# Patient Record
Sex: Female | Born: 1955
Health system: Southern US, Community
[De-identification: ages and names within clinical notes are randomized; demographics above are authoritative.]

## PROBLEM LIST (undated history)

## (undated) DIAGNOSIS — J189 Pneumonia, unspecified organism: Secondary | ICD-10-CM

## (undated) DIAGNOSIS — M949 Disorder of cartilage, unspecified: Secondary | ICD-10-CM

## (undated) DIAGNOSIS — M549 Dorsalgia, unspecified: Secondary | ICD-10-CM

## (undated) DIAGNOSIS — N390 Urinary tract infection, site not specified: Secondary | ICD-10-CM

## (undated) DIAGNOSIS — K30 Functional dyspepsia: Secondary | ICD-10-CM

## (undated) DIAGNOSIS — R112 Nausea with vomiting, unspecified: Secondary | ICD-10-CM

## (undated) DIAGNOSIS — E785 Hyperlipidemia, unspecified: Secondary | ICD-10-CM

## (undated) DIAGNOSIS — Z87442 Personal history of urinary calculi: Secondary | ICD-10-CM

## (undated) DIAGNOSIS — R300 Dysuria: Secondary | ICD-10-CM

## (undated) DIAGNOSIS — Z9889 Other specified postprocedural states: Secondary | ICD-10-CM

## (undated) DIAGNOSIS — K219 Gastro-esophageal reflux disease without esophagitis: Secondary | ICD-10-CM

## (undated) DIAGNOSIS — R109 Unspecified abdominal pain: Secondary | ICD-10-CM

## (undated) DIAGNOSIS — I639 Cerebral infarction, unspecified: Secondary | ICD-10-CM

## (undated) DIAGNOSIS — N952 Postmenopausal atrophic vaginitis: Secondary | ICD-10-CM

## (undated) DIAGNOSIS — Z9079 Acquired absence of other genital organ(s): Secondary | ICD-10-CM

## (undated) DIAGNOSIS — Z8719 Personal history of other diseases of the digestive system: Secondary | ICD-10-CM

## (undated) DIAGNOSIS — K589 Irritable bowel syndrome without diarrhea: Secondary | ICD-10-CM

## (undated) DIAGNOSIS — M797 Fibromyalgia: Secondary | ICD-10-CM

## (undated) DIAGNOSIS — M51379 Other intervertebral disc degeneration, lumbosacral region without mention of lumbar back pain or lower extremity pain: Secondary | ICD-10-CM

## (undated) DIAGNOSIS — G8929 Other chronic pain: Secondary | ICD-10-CM

## (undated) DIAGNOSIS — T4145XA Adverse effect of unspecified anesthetic, initial encounter: Secondary | ICD-10-CM

## (undated) DIAGNOSIS — M5137 Other intervertebral disc degeneration, lumbosacral region: Secondary | ICD-10-CM

## (undated) DIAGNOSIS — R198 Other specified symptoms and signs involving the digestive system and abdomen: Secondary | ICD-10-CM

## (undated) DIAGNOSIS — F419 Anxiety disorder, unspecified: Secondary | ICD-10-CM

## (undated) DIAGNOSIS — F988 Other specified behavioral and emotional disorders with onset usually occurring in childhood and adolescence: Secondary | ICD-10-CM

## (undated) DIAGNOSIS — M1990 Unspecified osteoarthritis, unspecified site: Secondary | ICD-10-CM

## (undated) DIAGNOSIS — R1011 Right upper quadrant pain: Secondary | ICD-10-CM

## (undated) DIAGNOSIS — I350 Nonrheumatic aortic (valve) stenosis: Secondary | ICD-10-CM

## (undated) DIAGNOSIS — S02640A Fracture of ramus of mandible, unspecified side, initial encounter for closed fracture: Secondary | ICD-10-CM

## (undated) DIAGNOSIS — F341 Dysthymic disorder: Secondary | ICD-10-CM

## (undated) DIAGNOSIS — Z9283 Personal history of failed moderate sedation: Secondary | ICD-10-CM

## (undated) DIAGNOSIS — M899 Disorder of bone, unspecified: Secondary | ICD-10-CM

## (undated) DIAGNOSIS — R079 Chest pain, unspecified: Secondary | ICD-10-CM

## (undated) DIAGNOSIS — T8859XA Other complications of anesthesia, initial encounter: Secondary | ICD-10-CM

## (undated) DIAGNOSIS — Z8739 Personal history of other diseases of the musculoskeletal system and connective tissue: Principal | ICD-10-CM

## (undated) DIAGNOSIS — R748 Abnormal levels of other serum enzymes: Secondary | ICD-10-CM

## (undated) DIAGNOSIS — R413 Other amnesia: Secondary | ICD-10-CM

## (undated) DIAGNOSIS — K649 Unspecified hemorrhoids: Secondary | ICD-10-CM

## (undated) DIAGNOSIS — Z9089 Acquired absence of other organs: Secondary | ICD-10-CM

## (undated) DIAGNOSIS — Z8489 Family history of other specified conditions: Secondary | ICD-10-CM

## (undated) DIAGNOSIS — D485 Neoplasm of uncertain behavior of skin: Secondary | ICD-10-CM

## (undated) DIAGNOSIS — G894 Chronic pain syndrome: Secondary | ICD-10-CM

## (undated) DIAGNOSIS — R45851 Suicidal ideations: Secondary | ICD-10-CM

## (undated) DIAGNOSIS — M5412 Radiculopathy, cervical region: Secondary | ICD-10-CM

## (undated) HISTORY — DX: Irritable bowel syndrome without diarrhea: K58.9

## (undated) HISTORY — PX: COLONOSCOPY: SHX174

## (undated) HISTORY — DX: Urinary tract infection, site not specified: N39.0

## (undated) HISTORY — DX: Dorsalgia, unspecified: M54.9

## (undated) HISTORY — DX: Disorder of bone, unspecified: M89.9

## (undated) HISTORY — PX: TUBAL LIGATION: SHX77

## (undated) HISTORY — DX: Suicidal ideations: R45.851

## (undated) HISTORY — DX: Chronic pain syndrome: G89.4

## (undated) HISTORY — DX: Neoplasm of uncertain behavior of skin: D48.5

## (undated) HISTORY — PX: DILATION AND CURETTAGE OF UTERUS: SHX78

## (undated) HISTORY — DX: Other specified postprocedural states: Z98.89

## (undated) HISTORY — DX: Disorder of cartilage, unspecified: M94.9

## (undated) HISTORY — DX: Dysuria: R30.0

## (undated) HISTORY — DX: Unspecified hemorrhoids: K64.9

## (undated) HISTORY — DX: Postmenopausal atrophic vaginitis: N95.2

## (undated) HISTORY — DX: Unspecified osteoarthritis, unspecified site: M19.90

## (undated) HISTORY — PX: CARDIAC CATHETERIZATION: SHX172

## (undated) HISTORY — DX: Right upper quadrant pain: R10.11

## (undated) HISTORY — DX: Personal history of urinary calculi: Z87.442

## (undated) HISTORY — DX: Personal history of other diseases of the musculoskeletal system and connective tissue: Z87.39

## (undated) HISTORY — DX: Unspecified abdominal pain: R10.9

## (undated) HISTORY — DX: Radiculopathy, cervical region: M54.12

## (undated) HISTORY — PX: OTHER SURGICAL HISTORY: SHX169

## (undated) HISTORY — DX: Fibromyalgia: M79.7

## (undated) HISTORY — DX: Fracture of ramus of mandible, unspecified side, initial encounter for closed fracture: S02.640A

## (undated) HISTORY — DX: Acquired absence of other organs: Z90.89

## (undated) HISTORY — DX: Dysthymic disorder: F34.1

## (undated) HISTORY — DX: Personal history of other diseases of the digestive system: Z87.19

## (undated) HISTORY — DX: Acquired absence of other genital organ(s): Z90.79

## (undated) HISTORY — DX: Personal history of failed moderate sedation: Z92.83

## (undated) HISTORY — DX: Other specified behavioral and emotional disorders with onset usually occurring in childhood and adolescence: F98.8

## (undated) HISTORY — PX: ESOPHAGOGASTRODUODENOSCOPY: SHX1529

## (undated) HISTORY — DX: Chest pain, unspecified: R07.9

---
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
## 1991-09-26 HISTORY — PX: BILATERAL OOPHORECTOMY: SHX1221

## 1996-09-25 HISTORY — PX: ROTATOR CUFF REPAIR: SHX139

## 1998-03-12 ENCOUNTER — Encounter: Admission: RE | Admit: 1998-03-12 | Discharge: 1998-06-10 | Payer: Self-pay | Admitting: Anesthesiology

## 1999-01-19 ENCOUNTER — Encounter: Admission: RE | Admit: 1999-01-19 | Discharge: 1999-04-19 | Payer: Self-pay | Admitting: Anesthesiology

## 2000-01-25 ENCOUNTER — Ambulatory Visit (HOSPITAL_COMMUNITY): Admission: RE | Admit: 2000-01-25 | Discharge: 2000-01-25 | Payer: Self-pay | Admitting: Specialist

## 2000-01-25 ENCOUNTER — Encounter: Payer: Self-pay | Admitting: Specialist

## 2000-01-29 ENCOUNTER — Encounter: Payer: Self-pay | Admitting: Radiology

## 2000-01-29 ENCOUNTER — Ambulatory Visit (HOSPITAL_COMMUNITY): Admission: RE | Admit: 2000-01-29 | Discharge: 2000-01-29 | Payer: Self-pay

## 2001-08-15 ENCOUNTER — Inpatient Hospital Stay (HOSPITAL_COMMUNITY): Admission: RE | Admit: 2001-08-15 | Discharge: 2001-08-16 | Payer: Self-pay | Admitting: Specialist

## 2002-08-25 ENCOUNTER — Encounter: Payer: Self-pay | Admitting: Internal Medicine

## 2003-07-15 ENCOUNTER — Encounter: Payer: Self-pay | Admitting: Internal Medicine

## 2003-07-15 ENCOUNTER — Ambulatory Visit (HOSPITAL_COMMUNITY): Admission: RE | Admit: 2003-07-15 | Discharge: 2003-07-15 | Payer: Self-pay | Admitting: Internal Medicine

## 2003-09-26 HISTORY — PX: SPINAL CORD STIMULATOR IMPLANT: SHX2422

## 2004-04-08 ENCOUNTER — Ambulatory Visit (HOSPITAL_COMMUNITY): Admission: RE | Admit: 2004-04-08 | Discharge: 2004-04-08 | Payer: Self-pay | Admitting: Internal Medicine

## 2004-08-22 ENCOUNTER — Ambulatory Visit: Payer: Self-pay | Admitting: Internal Medicine

## 2004-08-29 ENCOUNTER — Ambulatory Visit: Payer: Self-pay | Admitting: Internal Medicine

## 2005-03-06 ENCOUNTER — Ambulatory Visit: Payer: Self-pay | Admitting: Internal Medicine

## 2005-05-22 ENCOUNTER — Ambulatory Visit: Payer: Self-pay | Admitting: Internal Medicine

## 2005-07-20 ENCOUNTER — Ambulatory Visit: Payer: Self-pay | Admitting: Internal Medicine

## 2005-11-09 ENCOUNTER — Ambulatory Visit: Payer: Self-pay | Admitting: Pulmonary Disease

## 2006-05-07 ENCOUNTER — Ambulatory Visit: Payer: Self-pay | Admitting: Internal Medicine

## 2006-06-25 LAB — HM MAMMOGRAPHY

## 2006-08-06 ENCOUNTER — Ambulatory Visit: Payer: Self-pay | Admitting: Internal Medicine

## 2006-11-07 ENCOUNTER — Ambulatory Visit: Payer: Self-pay | Admitting: Internal Medicine

## 2006-11-07 LAB — CONVERTED CEMR LAB
Bilirubin Urine: NEGATIVE
Crystals: NEGATIVE
Hemoglobin, Urine: NEGATIVE
Ketones, ur: NEGATIVE mg/dL
Mucus, UA: NEGATIVE
Nitrite: POSITIVE — AB
Specific Gravity, Urine: >=1.03 (ref 1.000–1.03)
Total Protein, Urine: NEGATIVE mg/dL
Urine Glucose: NEGATIVE mg/dL
Urobilinogen, UA: 0.2 (ref 0.0–1.0)
pH: 6 (ref 5.0–8.0)

## 2006-11-08 ENCOUNTER — Ambulatory Visit: Payer: Self-pay | Admitting: Internal Medicine

## 2006-11-26 ENCOUNTER — Ambulatory Visit: Payer: Self-pay | Admitting: Internal Medicine

## 2006-11-26 LAB — CONVERTED CEMR LAB
ALT: 16 units/L (ref 0–40)
AST: 20 units/L (ref 0–37)
Albumin: 4.1 g/dL (ref 3.5–5.2)
Alkaline Phosphatase: 72 units/L (ref 39–117)
BUN: 11 mg/dL (ref 6–23)
Basophils Absolute: 0 10*3/uL (ref 0.0–0.1)
Basophils Relative: 0.6 % (ref 0.0–1.0)
Bilirubin Urine: NEGATIVE
Bilirubin, Direct: 0.1 mg/dL (ref 0.0–0.3)
CO2: 31 meq/L (ref 19–32)
Calcium: 9.3 mg/dL (ref 8.4–10.5)
Chloride: 104 meq/L (ref 96–112)
Cholesterol: 325 mg/dL (ref 0–200)
Creatinine, Ser: 0.9 mg/dL (ref 0.4–1.2)
Direct LDL: 229.1 mg/dL
Eosinophils Absolute: 0.1 10*3/uL (ref 0.0–0.6)
Eosinophils Relative: 1.5 % (ref 0.0–5.0)
GFR calc Af Amer: 85 mL/min
GFR calc non Af Amer: 70 mL/min
Glucose, Bld: 101 mg/dL — ABNORMAL HIGH (ref 70–99)
HCT: 44.1 % (ref 36.0–46.0)
HDL: 40.8 mg/dL (ref >39.0)
Hemoglobin, Urine: NEGATIVE
Hemoglobin: 15.3 g/dL — ABNORMAL HIGH (ref 12.0–15.0)
Ketones, ur: NEGATIVE mg/dL
Leukocytes, UA: NEGATIVE
Lymphocytes Relative: 15.7 % (ref 12.0–46.0)
MCHC: 34.6 g/dL (ref 30.0–36.0)
MCV: 92.4 fL (ref 78.0–100.0)
Monocytes Absolute: 0.3 10*3/uL (ref 0.2–0.7)
Monocytes Relative: 3.5 % (ref 3.0–11.0)
Neutro Abs: 6.3 10*3/uL (ref 1.4–7.7)
Neutrophils Relative %: 78.7 % — ABNORMAL HIGH (ref 43.0–77.0)
Nitrite: NEGATIVE
Platelets: 394 10*3/uL (ref 150–400)
Potassium: 4 meq/L (ref 3.5–5.1)
RBC: 4.78 M/uL (ref 3.87–5.11)
RDW: 12.1 % (ref 11.5–14.6)
Sodium: 141 meq/L (ref 135–145)
Specific Gravity, Urine: 1.02 (ref 1.000–1.03)
TSH: 1.49 microintl units/mL (ref 0.35–5.50)
Total Bilirubin: 0.5 mg/dL (ref 0.3–1.2)
Total CHOL/HDL Ratio: 8
Total Protein: 7.1 g/dL (ref 6.0–8.3)
Triglycerides: 209 mg/dL (ref 0–149)
Urine Glucose: NEGATIVE mg/dL
Urobilinogen, UA: 0.2 (ref 0.0–1.0)
VLDL: 42 mg/dL — ABNORMAL HIGH (ref 0–40)
WBC: 7.9 10*3/uL (ref 4.5–10.5)
pH: 6.5 (ref 5.0–8.0)

## 2007-04-24 ENCOUNTER — Ambulatory Visit: Payer: Self-pay | Admitting: Internal Medicine

## 2007-04-24 LAB — CONVERTED CEMR LAB
Free T4: 0.6 ng/dL (ref 0.6–1.6)
TSH: 1.03 microintl units/mL (ref 0.35–5.50)

## 2007-04-25 ENCOUNTER — Ambulatory Visit: Payer: Self-pay | Admitting: Internal Medicine

## 2007-05-09 ENCOUNTER — Inpatient Hospital Stay (HOSPITAL_COMMUNITY): Admission: EM | Admit: 2007-05-09 | Discharge: 2007-05-12 | Payer: Self-pay | Admitting: Emergency Medicine

## 2007-05-09 ENCOUNTER — Ambulatory Visit: Payer: Self-pay | Admitting: Internal Medicine

## 2007-05-10 ENCOUNTER — Encounter: Payer: Self-pay | Admitting: Internal Medicine

## 2007-05-13 ENCOUNTER — Other Ambulatory Visit (HOSPITAL_COMMUNITY): Admission: RE | Admit: 2007-05-13 | Discharge: 2007-05-15 | Payer: Self-pay | Admitting: Psychiatry

## 2007-05-21 ENCOUNTER — Ambulatory Visit: Payer: Self-pay | Admitting: Internal Medicine

## 2007-05-22 ENCOUNTER — Ambulatory Visit: Payer: Self-pay | Admitting: Internal Medicine

## 2007-05-29 ENCOUNTER — Encounter: Admission: RE | Admit: 2007-05-29 | Discharge: 2007-05-29 | Payer: Self-pay | Admitting: Internal Medicine

## 2007-07-03 ENCOUNTER — Ambulatory Visit: Payer: Self-pay | Admitting: Internal Medicine

## 2007-07-03 ENCOUNTER — Encounter: Payer: Self-pay | Admitting: Internal Medicine

## 2007-07-03 LAB — CONVERTED CEMR LAB
IgA: 217 mg/dL (ref 68–378)
Tissue Transglutaminase Ab, IgA: 0.3 units (ref <7)

## 2007-07-15 ENCOUNTER — Encounter: Payer: Self-pay | Admitting: Internal Medicine

## 2007-07-15 ENCOUNTER — Ambulatory Visit: Payer: Self-pay | Admitting: Internal Medicine

## 2007-07-15 LAB — CONVERTED CEMR LAB
Free T4: 0.8 ng/dL (ref 0.6–1.6)
TSH: 0.59 microintl units/mL (ref 0.35–5.50)
Vitamin B-12: 504 pg/mL (ref 211–911)

## 2007-07-16 DIAGNOSIS — Z87442 Personal history of urinary calculi: Secondary | ICD-10-CM | POA: Insufficient documentation

## 2007-07-16 DIAGNOSIS — N952 Postmenopausal atrophic vaginitis: Secondary | ICD-10-CM | POA: Insufficient documentation

## 2007-07-16 DIAGNOSIS — Z8739 Personal history of other diseases of the musculoskeletal system and connective tissue: Secondary | ICD-10-CM | POA: Insufficient documentation

## 2007-07-16 DIAGNOSIS — M549 Dorsalgia, unspecified: Secondary | ICD-10-CM | POA: Insufficient documentation

## 2007-07-16 DIAGNOSIS — R1011 Right upper quadrant pain: Secondary | ICD-10-CM | POA: Insufficient documentation

## 2007-07-16 DIAGNOSIS — S02640A Fracture of ramus of mandible, unspecified side, initial encounter for closed fracture: Secondary | ICD-10-CM | POA: Insufficient documentation

## 2007-07-16 DIAGNOSIS — M5412 Radiculopathy, cervical region: Secondary | ICD-10-CM

## 2007-07-16 HISTORY — DX: Personal history of urinary calculi: Z87.442

## 2007-07-16 HISTORY — DX: Dorsalgia, unspecified: M54.9

## 2007-07-16 HISTORY — DX: Personal history of other diseases of the musculoskeletal system and connective tissue: Z87.39

## 2007-07-16 HISTORY — DX: Postmenopausal atrophic vaginitis: N95.2

## 2007-07-16 HISTORY — DX: Right upper quadrant pain: R10.11

## 2007-07-16 HISTORY — DX: Radiculopathy, cervical region: M54.12

## 2007-07-16 HISTORY — DX: Fracture of ramus of mandible, unspecified side, initial encounter for closed fracture: S02.640A

## 2007-09-11 ENCOUNTER — Encounter: Payer: Self-pay | Admitting: *Deleted

## 2007-09-11 DIAGNOSIS — Z8719 Personal history of other diseases of the digestive system: Secondary | ICD-10-CM

## 2007-09-11 DIAGNOSIS — G894 Chronic pain syndrome: Secondary | ICD-10-CM

## 2007-09-11 DIAGNOSIS — K649 Unspecified hemorrhoids: Secondary | ICD-10-CM | POA: Insufficient documentation

## 2007-09-11 DIAGNOSIS — M1990 Unspecified osteoarthritis, unspecified site: Secondary | ICD-10-CM | POA: Insufficient documentation

## 2007-09-11 DIAGNOSIS — K589 Irritable bowel syndrome without diarrhea: Secondary | ICD-10-CM | POA: Insufficient documentation

## 2007-09-11 DIAGNOSIS — Z9889 Other specified postprocedural states: Secondary | ICD-10-CM | POA: Insufficient documentation

## 2007-09-11 DIAGNOSIS — Z9079 Acquired absence of other genital organ(s): Secondary | ICD-10-CM | POA: Insufficient documentation

## 2007-09-11 DIAGNOSIS — Z9089 Acquired absence of other organs: Secondary | ICD-10-CM

## 2007-09-11 HISTORY — DX: Acquired absence of other organs: Z90.89

## 2007-09-11 HISTORY — DX: Personal history of other diseases of the digestive system: Z87.19

## 2007-09-11 HISTORY — DX: Unspecified hemorrhoids: K64.9

## 2007-09-11 HISTORY — DX: Chronic pain syndrome: G89.4

## 2007-09-11 HISTORY — DX: Irritable bowel syndrome, unspecified: K58.9

## 2007-09-11 HISTORY — DX: Other specified postprocedural states: Z98.89

## 2007-09-11 HISTORY — DX: Acquired absence of other genital organ(s): Z90.79

## 2007-09-11 HISTORY — DX: Unspecified osteoarthritis, unspecified site: M19.90

## 2007-10-22 ENCOUNTER — Ambulatory Visit: Payer: Self-pay | Admitting: Internal Medicine

## 2007-10-22 LAB — CONVERTED CEMR LAB
Anti Nuclear Antibody(ANA): NEGATIVE
Basophils Absolute: 0.1 10*3/uL (ref 0.0–0.1)
Basophils Relative: 2.1 % — ABNORMAL HIGH (ref 0.0–1.0)
Eosinophils Absolute: 0.2 10*3/uL (ref 0.0–0.6)
Eosinophils Relative: 3.7 % (ref 0.0–5.0)
HCT: 44.6 % (ref 36.0–46.0)
Hemoglobin: 15.5 g/dL — ABNORMAL HIGH (ref 12.0–15.0)
Lymphocytes Relative: 33 % (ref 12.0–46.0)
MCHC: 34.7 g/dL (ref 30.0–36.0)
MCV: 96.9 fL (ref 78.0–100.0)
Monocytes Absolute: 0.4 10*3/uL (ref 0.2–0.7)
Monocytes Relative: 8.7 % (ref 3.0–11.0)
Neutro Abs: 2.7 10*3/uL (ref 1.4–7.7)
Neutrophils Relative %: 52.5 % (ref 43.0–77.0)
Platelets: 332 10*3/uL (ref 150–400)
RBC: 4.6 M/uL (ref 3.87–5.11)
RDW: 11.3 % — ABNORMAL LOW (ref 11.5–14.6)
Sed Rate: 8 mm/hr (ref 0–25)
Total CK: 41 units/L (ref 7–177)
WBC: 5.1 10*3/uL (ref 4.5–10.5)

## 2007-10-23 DIAGNOSIS — M8580 Other specified disorders of bone density and structure, unspecified site: Secondary | ICD-10-CM | POA: Insufficient documentation

## 2007-10-23 DIAGNOSIS — M899 Disorder of bone, unspecified: Secondary | ICD-10-CM

## 2007-10-23 HISTORY — DX: Disorder of bone, unspecified: M89.9

## 2007-10-24 ENCOUNTER — Telehealth: Payer: Self-pay | Admitting: Internal Medicine

## 2007-10-24 ENCOUNTER — Encounter: Payer: Self-pay | Admitting: Internal Medicine

## 2007-11-14 ENCOUNTER — Encounter: Payer: Self-pay | Admitting: Internal Medicine

## 2008-01-21 ENCOUNTER — Telehealth: Payer: Self-pay | Admitting: Internal Medicine

## 2008-02-20 ENCOUNTER — Ambulatory Visit: Payer: Self-pay | Admitting: Internal Medicine

## 2008-02-20 DIAGNOSIS — R300 Dysuria: Secondary | ICD-10-CM

## 2008-02-20 HISTORY — DX: Dysuria: R30.0

## 2008-02-20 LAB — CONVERTED CEMR LAB
Bilirubin Urine: NEGATIVE
Crystals: NEGATIVE
Hemoglobin, Urine: NEGATIVE
Ketones, ur: NEGATIVE mg/dL
Nitrite: POSITIVE — AB
Specific Gravity, Urine: 1.02 (ref 1.000–1.03)
Urine Glucose: NEGATIVE mg/dL
Urobilinogen, UA: 0.2 (ref 0.0–1.0)
pH: 6 (ref 5.0–8.0)

## 2008-02-21 ENCOUNTER — Telehealth: Payer: Self-pay | Admitting: Internal Medicine

## 2008-03-30 ENCOUNTER — Ambulatory Visit: Payer: Self-pay | Admitting: Internal Medicine

## 2008-03-30 ENCOUNTER — Telehealth: Payer: Self-pay | Admitting: Internal Medicine

## 2008-03-30 LAB — CONVERTED CEMR LAB
Bilirubin Urine: NEGATIVE
Hemoglobin, Urine: NEGATIVE
Ketones, ur: NEGATIVE mg/dL
Leukocytes, UA: NEGATIVE
Nitrite: NEGATIVE
Specific Gravity, Urine: >=1.03 (ref 1.000–1.03)
Total Protein, Urine: NEGATIVE mg/dL
Urobilinogen, UA: 0.2 (ref 0.0–1.0)
pH: 5.5 (ref 5.0–8.0)

## 2008-03-31 ENCOUNTER — Ambulatory Visit: Payer: Self-pay | Admitting: Internal Medicine

## 2008-04-13 ENCOUNTER — Telehealth: Payer: Self-pay | Admitting: Internal Medicine

## 2008-04-15 ENCOUNTER — Telehealth: Payer: Self-pay | Admitting: Internal Medicine

## 2008-04-21 ENCOUNTER — Telehealth: Payer: Self-pay | Admitting: Internal Medicine

## 2008-06-05 ENCOUNTER — Telehealth: Payer: Self-pay | Admitting: Internal Medicine

## 2008-06-16 ENCOUNTER — Telehealth: Payer: Self-pay | Admitting: Internal Medicine

## 2008-07-23 ENCOUNTER — Encounter: Payer: Self-pay | Admitting: Internal Medicine

## 2008-09-03 ENCOUNTER — Telehealth: Payer: Self-pay | Admitting: Internal Medicine

## 2008-09-03 ENCOUNTER — Ambulatory Visit: Payer: Self-pay | Admitting: Internal Medicine

## 2008-09-03 DIAGNOSIS — R079 Chest pain, unspecified: Secondary | ICD-10-CM

## 2008-09-03 HISTORY — DX: Chest pain, unspecified: R07.9

## 2008-10-26 ENCOUNTER — Telehealth: Payer: Self-pay | Admitting: Internal Medicine

## 2008-11-23 ENCOUNTER — Ambulatory Visit: Payer: Self-pay | Admitting: Internal Medicine

## 2008-11-23 DIAGNOSIS — D485 Neoplasm of uncertain behavior of skin: Secondary | ICD-10-CM

## 2008-11-23 HISTORY — DX: Neoplasm of uncertain behavior of skin: D48.5

## 2009-02-01 ENCOUNTER — Ambulatory Visit: Payer: Self-pay | Admitting: Internal Medicine

## 2009-02-02 DIAGNOSIS — F988 Other specified behavioral and emotional disorders with onset usually occurring in childhood and adolescence: Secondary | ICD-10-CM | POA: Insufficient documentation

## 2009-02-02 HISTORY — DX: Other specified behavioral and emotional disorders with onset usually occurring in childhood and adolescence: F98.8

## 2009-02-15 ENCOUNTER — Telehealth: Payer: Self-pay | Admitting: Internal Medicine

## 2009-02-16 ENCOUNTER — Ambulatory Visit: Payer: Self-pay | Admitting: Internal Medicine

## 2009-02-16 DIAGNOSIS — R109 Unspecified abdominal pain: Secondary | ICD-10-CM | POA: Insufficient documentation

## 2009-02-16 HISTORY — DX: Unspecified abdominal pain: R10.9

## 2009-03-01 ENCOUNTER — Telehealth: Payer: Self-pay | Admitting: Internal Medicine

## 2009-03-24 ENCOUNTER — Ambulatory Visit: Payer: Self-pay | Admitting: Internal Medicine

## 2009-05-03 ENCOUNTER — Telehealth: Payer: Self-pay | Admitting: Internal Medicine

## 2009-05-05 ENCOUNTER — Telehealth: Payer: Self-pay | Admitting: Internal Medicine

## 2009-05-14 ENCOUNTER — Telehealth: Payer: Self-pay | Admitting: Internal Medicine

## 2009-05-20 ENCOUNTER — Encounter: Payer: Self-pay | Admitting: Internal Medicine

## 2009-06-01 ENCOUNTER — Telehealth: Payer: Self-pay | Admitting: Internal Medicine

## 2009-07-07 ENCOUNTER — Encounter: Payer: Self-pay | Admitting: Internal Medicine

## 2009-08-16 ENCOUNTER — Telehealth: Payer: Self-pay | Admitting: Internal Medicine

## 2009-09-16 ENCOUNTER — Encounter: Payer: Self-pay | Admitting: Internal Medicine

## 2009-09-20 ENCOUNTER — Telehealth: Payer: Self-pay | Admitting: Internal Medicine

## 2009-10-11 ENCOUNTER — Telehealth (INDEPENDENT_AMBULATORY_CARE_PROVIDER_SITE_OTHER): Payer: Self-pay | Admitting: *Deleted

## 2009-10-13 ENCOUNTER — Encounter: Payer: Self-pay | Admitting: Internal Medicine

## 2009-12-24 ENCOUNTER — Encounter: Payer: Self-pay | Admitting: Internal Medicine

## 2009-12-27 ENCOUNTER — Telehealth: Payer: Self-pay | Admitting: Internal Medicine

## 2010-01-24 ENCOUNTER — Encounter: Payer: Self-pay | Admitting: Internal Medicine

## 2010-02-15 ENCOUNTER — Telehealth: Payer: Self-pay | Admitting: Internal Medicine

## 2010-02-19 ENCOUNTER — Encounter: Payer: Self-pay | Admitting: Internal Medicine

## 2010-05-20 ENCOUNTER — Telehealth: Payer: Self-pay | Admitting: Internal Medicine

## 2010-06-13 ENCOUNTER — Ambulatory Visit: Payer: Self-pay | Admitting: Internal Medicine

## 2010-06-13 ENCOUNTER — Telehealth: Payer: Self-pay | Admitting: Internal Medicine

## 2010-06-13 LAB — CONVERTED CEMR LAB
ALT: 19 units/L (ref 0–35)
AST: 31 units/L (ref 0–37)
Basophils Relative: 0.2 % (ref 0.0–3.0)
Eosinophils Relative: 0.1 % (ref 0.0–5.0)
GFR calc non Af Amer: 76.02 mL/min (ref >60)
HCT: 45.7 % (ref 36.0–46.0)
HDL: 45.9 mg/dL (ref >39.00)
Hemoglobin: 15.8 g/dL — ABNORMAL HIGH (ref 12.0–15.0)
Lymphs Abs: 2 10*3/uL (ref 0.7–4.0)
Monocytes Relative: 9 % (ref 3.0–12.0)
Neutro Abs: 4.9 10*3/uL (ref 1.4–7.7)
Platelets: 372 10*3/uL (ref 150.0–400.0)
Potassium: 4.1 meq/L (ref 3.5–5.1)
RBC: 4.86 M/uL (ref 3.87–5.11)
Sodium: 140 meq/L (ref 135–145)
TSH: 0.97 microintl units/mL (ref 0.35–5.50)
Total Bilirubin: 0.4 mg/dL (ref 0.3–1.2)
VLDL: 74 mg/dL — ABNORMAL HIGH (ref 0.0–40.0)
WBC: 7.6 10*3/uL (ref 4.5–10.5)

## 2010-06-17 ENCOUNTER — Ambulatory Visit: Payer: Self-pay | Admitting: Internal Medicine

## 2010-06-17 DIAGNOSIS — F341 Dysthymic disorder: Secondary | ICD-10-CM | POA: Insufficient documentation

## 2010-06-17 HISTORY — DX: Dysthymic disorder: F34.1

## 2010-06-22 ENCOUNTER — Telehealth: Payer: Self-pay | Admitting: Internal Medicine

## 2010-10-06 ENCOUNTER — Other Ambulatory Visit: Payer: Self-pay | Admitting: Internal Medicine

## 2010-10-06 ENCOUNTER — Ambulatory Visit
Admission: RE | Admit: 2010-10-06 | Discharge: 2010-10-06 | Payer: Self-pay | Source: Home / Self Care | Attending: Internal Medicine | Admitting: Internal Medicine

## 2010-10-06 DIAGNOSIS — N390 Urinary tract infection, site not specified: Secondary | ICD-10-CM

## 2010-10-06 HISTORY — DX: Urinary tract infection, site not specified: N39.0

## 2010-10-06 LAB — URINALYSIS, ROUTINE W REFLEX MICROSCOPIC
Bilirubin Urine: NEGATIVE
Hemoglobin, Urine: NEGATIVE
Ketones, ur: NEGATIVE
Leukocytes, UA: NEGATIVE
Nitrite: NEGATIVE
Specific Gravity, Urine: >=1.03 (ref 1.000–1.030)
Total Protein, Urine: NEGATIVE
Urine Glucose: NEGATIVE
Urobilinogen, UA: 0.2 (ref 0.0–1.0)
pH: 5 (ref 5.0–8.0)

## 2010-10-07 ENCOUNTER — Encounter: Payer: Self-pay | Admitting: Internal Medicine

## 2010-10-10 ENCOUNTER — Telehealth: Payer: Self-pay | Admitting: Internal Medicine

## 2010-10-20 ENCOUNTER — Telehealth: Payer: Self-pay | Admitting: Internal Medicine

## 2010-10-25 ENCOUNTER — Ambulatory Visit
Admission: RE | Admit: 2010-10-25 | Discharge: 2010-10-25 | Payer: Self-pay | Source: Home / Self Care | Attending: Internal Medicine | Admitting: Internal Medicine

## 2010-10-25 ENCOUNTER — Other Ambulatory Visit: Payer: Self-pay | Admitting: Internal Medicine

## 2010-10-25 ENCOUNTER — Telehealth: Payer: Self-pay | Admitting: Internal Medicine

## 2010-10-25 LAB — URINALYSIS
Ketones, ur: NEGATIVE
Specific Gravity, Urine: 1.025 (ref 1.000–1.030)
Urine Glucose: NEGATIVE
Urobilinogen, UA: 0.2 (ref 0.0–1.0)

## 2010-10-25 NOTE — Progress Notes (Signed)
Summary: PA omeprazole  Phone Note From Pharmacy   Caller: [facility] Summary of Call: Per pharmacy, pt insurance requires a prior auth for omeprazole 20mg . Advised to call [phone] // ID # [id number]. Called [facility] was advised that fax will be sent to our office (case# [id number]).Marland KitchenMarland KitchenAlvy Beal Archie CMA  Feb 15, 2010 2:01 PM   Follow-up for Phone Call        form signed and faxed. Follow-up by: Daphane Shepherd,  Feb 22, 2010 9:22 AM

## 2010-10-25 NOTE — Progress Notes (Signed)
  Phone Note Outgoing Call   Call placed by: norins Call placed to: Patient Details for Reason: follow-up Summary of Call: [name] [name] did call [facility] - [name] is not taking any patients. She has made an appointment with a counsellor in [place] for tomorrow at 10:00 AM Initial call taken by: Jacques Navy MD,  June 22, 2010 1:04 PM

## 2010-10-25 NOTE — Medication Information (Signed)
Summary: Prior Auth/[facility]  Prior Auth/[facility]   Imported By: Lester Chula 02/24/2010 07:49:38  _____________________________________________________________________  External Attachment:    Type:   Image     Comment:   External Document

## 2010-10-25 NOTE — Letter (Signed)
Summary: [facility]  [facility]   Imported By: Lester [place] 01/06/2010 10:48:15  _____________________________________________________________________  External Attachment:    Type:   Image     Comment:   External Document

## 2010-10-25 NOTE — Progress Notes (Signed)
Summary: LABS  Phone Note Call from Patient   Summary of Call: Patient is requesting additional testing to labs she has comming up. She wants to be checked for "overgrowth of yeast" or a parasite. She c/o only craving sugar and wants to know the cause.  Initial call taken by: Lamar Sprinkles, CMA,  June 13, 2010 4:39 PM  Follow-up for Phone Call        OK to add A1C and CBCD  780.79 to labs.  Follow-up by: Jacques Navy MD,  June 14, 2010 12:43 PM  Additional Follow-up for Phone Call Additional follow up Details #1::        Pt informed, added to labs she had today Additional Follow-up by: Lamar Sprinkles, CMA,  June 14, 2010 3:09 PM

## 2010-10-25 NOTE — Medication Information (Signed)
Summary: Prior [place]ho for Dextroamphetamine/[facility]  Prior [place]ho for Dextroamphetamine/[facility]   Imported By: Sherian Rein 10/20/2009 12:08:40  _____________________________________________________________________  External Attachment:    Type:   Image     Comment:   External Document

## 2010-10-25 NOTE — Letter (Signed)
Summary: [facility]  [facility]   Imported By: Sherian Rein 09/29/2009 12:25:14  _____________________________________________________________________  External Attachment:    Type:   Image     Comment:   External Document

## 2010-10-25 NOTE — Medication Information (Signed)
Summary: Dextroamphetamine/[facility]  Dextroamphetamine/[facility]   Imported By: Sherian Rein 10/14/2009 14:17:17  _____________________________________________________________________  External Attachment:    Type:   Image     Comment:   External Document

## 2010-10-25 NOTE — Progress Notes (Signed)
Summary: REQ FOR RF [name] [name]  Phone Note Refill Request Call back at Home Phone [phone]   Refills Requested: Medication #1:  ALPRAZOLAM 1 MG TABS takes 3 to 5 times a day for chest pain Initial call taken by: Lamar Sprinkles, CMA,  May 20, 2010 5:34 PM  Follow-up for Phone Call        OK x 5 Follow-up by: Jacques Navy MD,  May 20, 2010 5:36 PM  Additional Follow-up for Phone Call Additional follow up Details #1::        Pt informed  Additional Follow-up by: Lamar Sprinkles, CMA,  May 20, 2010 6:18 PM    Prescriptions: ALPRAZOLAM 1 MG TABS (ALPRAZOLAM) takes 3 to 5 times a day for chest pain, anxiety and sleep.  #150 x 5   Entered by:   Lamar Sprinkles, CMA   Authorized by:   Jacques Navy MD   Signed by:   Lamar Sprinkles, CMA on 05/20/2010   Method used:   Telephoned to ...       [facility] (retail)       [address]       [place], [place]  [address]       Ph: [phone]       Fax: [phone]   RxID:   [id number]

## 2010-10-25 NOTE — Progress Notes (Signed)
  Phone Note Refill Request Message from:  Fax from Pharmacy on December 27, 2009 11:04 AM  Refills Requested: Medication #1:  ALPRAZOLAM 1 MG TABS takes 3 to 5 times a day for chest pain   Last Refilled: 11/24/2009 Please Advise refill, [facility] [place]  Initial call taken by: Ami Bullins CMA,  December 27, 2009 11:04 AM  Follow-up for Phone Call        last OV June '10. OK to refill x 3. will need ov Follow-up by: Jacques Navy MD,  December 27, 2009 12:19 PM    Prescriptions: ALPRAZOLAM 1 MG TABS (ALPRAZOLAM) takes 3 to 5 times a day for chest pain, anxiety and sleep.  #150 x 3   Entered by:   Bill Salinas CMA   Authorized by:   Jacques Navy MD   Signed by:   Bill Salinas CMA on 12/27/2009   Method used:   Telephoned to ...       [facility] Kitchen (retail)       [address]       [place], [place]  [address]       Ph: [phone]       Fax: [phone]   RxID:   [id number]
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
## 2010-10-25 NOTE — Letter (Signed)
Summary: [facility]  [facility]   Imported By: Sherian Rein 02/09/2010 09:22:28  _____________________________________________________________________  External Attachment:    Type:   Image     Comment:   External Document

## 2010-10-25 NOTE — Progress Notes (Signed)
Summary: Amphetamine PA  Phone Note From Pharmacy   Caller: [facility] Summary of Call: PA request--Amphetamine 25mg  Cap. Due to wait time request criteria via fax, will wait for forms. Initial call taken by: Lucious Groves,  October 11, 2009 4:28 PM  Follow-up for Phone Call        forms completed and awaiting signature. Follow-up by: Lucious Groves,  October 13, 2009 1:14 PM  Additional Follow-up for Phone Call Additional follow up Details #1::        done Additional Follow-up by: Jacques Navy MD,  October 13, 2009 1:22 PM    Additional Follow-up for Phone Call Additional follow up Details #2::    form was faxed, kelly has to hold Follow-up by: Ami Bullins CMA,  October 13, 2009 2:51 PM   Appended Document: Amphetamine PA Approved until 10-13-2010. Letter sent to scanning.

## 2010-10-27 NOTE — Progress Notes (Signed)
Summary: UTI?  Phone Note Call from Patient Call back at Home Phone [phone]   Summary of Call: Pt feels that she still has uti and wants to know if another antibiotic is needed. Should she have repeat u/a w/culture first?  Initial call taken by: Lamar Sprinkles, CMA,  October 20, 2010 3:04 PM  Follow-up for Phone Call        U/A 10/07/10 was negative. she can have another U/A 595.0 and we can culture it if positive Follow-up by: Jacques Navy MD,  October 20, 2010 5:20 PM  Additional Follow-up for Phone Call Additional follow up Details #1::        Pt informed, she will bring in specimen tomorrow..............Marland KitchenLamar Sprinkles, CMA  October 20, 2010 6:56 PM   Pt c/o urinary burning and pain, unable to get a ride to office today....................Marland KitchenLamar Sprinkles, CMA  October 21, 2010 5:46 PM     Additional Follow-up for Phone Call Additional follow up Details #2::    sorry - with last time she had symptoms the U/A was negative I cannot call in Rx. She will either come in Monday or go to an urgent care in [place]. Follow-up by: Jacques Navy MD,  October 21, 2010 6:17 PM  Additional Follow-up for Phone Call Additional follow up Details #3:: Details for Additional Follow-up Action Taken: Pt informed  Additional Follow-up by: Lamar Sprinkles, CMA,  October 21, 2010 6:21 PM

## 2010-10-27 NOTE — Assessment & Plan Note (Signed)
Summary: OV---STC   Vital Signs:  Patient profile:   55 year old female Height:      63.5 inches Weight:      148 pounds BMI:     25.90 Temp:     98.4 degrees F oral Pulse rate:   80 / minute Pulse rhythm:   regular Resp:     16 per minute BP sitting:   108 / 80  (left arm) Cuff size:   regular  Vitals Entered By: Lanier Prude, CMA(AAMA) (October 06, 2010 2:22 PM) CC: f/u c/o LBP, urine odor X 1 wk Is Patient Diabetic? No Comments pt is requesting Adderall  Rf.     Primary Care Provider:  Norins  CC:  f/u c/o LBP and urine odor X 1 wk.  History of Present Illness: Patient presents for follow-up. She was last seen for severe anxiety and depression. 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 She has also been doing self-treatment on anxiety and depression.  She reports that she got sick last week and had flank pain. She self-medicated with cipro 250mg  two times a day x 3 days (from 11/29/06) and she is feeling better. She did have chills and fevers and is still have symptoms and chills. She needs to have Rx to complete her treatment course.(too late for lab). She needs Rx for   She does need to have refill on dextroamphetmine.  Current Medications (verified): 1)  Alprazolam 1 Mg Tabs (Alprazolam) .... Takes 3 To 5 Times A Day For Chest Pain, Anxiety and Sleep. 2)  Promethegan 25 Mg  Supp (Promethazine Hcl) .Marland Kitchen.. 1 Per Rectum Twice A Week As Needed 3)  Promethazine Hcl 25 Mg  Tabs (Promethazine Hcl) .... 1/2 or 1 Q 6 As Needed 4)  Fentanyl 100 Mcg/hr  Pt72 (Fentanyl) .... Apply Q 48 Hrs 5)  Vicodin Es 7.5-750 Mg  Tabs (Hydrocodone-Acetaminophen) .... Take 1 Tablet By Mouth Four Times A Day 6)  Tramadol Hcl 50 Mg  Tabs (Tramadol Hcl) .... Three Times A Day As Needed Breakthrough Pain 7)  Miralax   Powd (Polyethylene Glycol 3350) .Marland Kitchen.. 17g Once Daily As Needed 8)  Neurontin 300 Mg  Caps (Gabapentin) .... Take 2-3  Tablets Daily As Needed 9)  Prilosec Otc 20 Mg  Tbec (Omeprazole Magnesium) .... Take Twice Daily 10)  Effexor Xr 150 Mg Xr24h-Cap (Venlafaxine Hcl) .... 2 Caps By Mouth Once Daily 11)  Excedrin Migraine 250-250-65 Mg Tabs (Aspirin-Acetaminophen-Caffeine) .... As Needed 12)  Dicyclomine Hcl 10 Mg Caps (Dicyclomine Hcl) .Marland Kitchen.. 1 By Mouth Three Times A Day As Needed Ibs Symptoms 13)  Amphetamine-Dextroamphetamine 30 Mg Xr24h-Cap (Amphetamine-Dextroamphetamine) .Marland Kitchen.. 1 By Mouth Once Daily Ok To Fill On or After 11/22/2009 14)  Premarin 0.9 Mg Tabs (Estrogens Conjugated) .Marland Kitchen.. 1 By Mouth Once Daily 15)  Adderall Xr 25 Mg Xr24h-Cap (Amphetamine-Dextroamphetamine) .... Take 1 By Mouth Once Daily Please Fill After 07/05/09 16)  Oxycodone Hcl 15 Mg Tabs (Oxycodone Hcl) .Marland Kitchen.. 1 Tab Q 4 To 6 Hours As Needed 17)  Tizanidine Hcl 4 Mg Tabs (Tizanidine Hcl) .Marland Kitchen.. 1 To 2 Tabs At Bedtime  Allergies (verified): 1)  ! Penicillin 2)  ! Lipitor 3)  ! * Vytorin 4)  ! Buspar 5)  ! * Cymbalata  Past History:  Past Medical History: Last updated: 09/03/2008 HIATAL HERNIA, HX OF (ICD-V12.79) CHRONIC PAIN SYNDROME (ICD-338.4) IRRITABLE BOWEL SYNDROME (ICD-564.1) * LOWER ESOPHAGEAL  RING OSTEOARTHRITIS (ICD-715.90) HEMORRHOIDS (ICD-455.6) VAGINITIS, ATROPHIC, POSTMENOPAUSAL (ICD-627.3) SYMPTOM, PAIN, ABDOMINAL, RIGHT UP QUADRANT (ICD-789.01) HX, PERSONAL, URINARY CALCULI (ICD-V13.01) HX, PERSONAL, MUSCULOSKELETAL DISORD NEC (ICD-V13.5) FX, RAMUS NOS, CLOSED (ICD-802.24) DEPRESSION, CHRONIC (ICD-311) ANXIETY STATE NEC (ICD-300.09) BACK PAIN (ICD-724.5)    Past Surgical History: Last updated: 09/11/2007 APPENDECTOMY, HX OF (ICD-V45.79) ROTATOR CUFF REPAIR, HX OF (ICD-V45.89) TAH/BSO, HX OF (ICD-V45.77) CERVICAL RADICULOPATHY, RIGHT (ICD-723.4) FH reviewed for relevance, SH/Risk Factors reviewed for relevance  Review of Systems       The patient complains of anorexia, fever, abdominal pain, and severe  indigestion/heartburn.  The patient denies weight loss, decreased hearing, chest pain, dyspnea on exertion, prolonged cough, hematuria, muscle weakness, difficulty walking, unusual weight change, enlarged lymph nodes, and angioedema.    Physical Exam  General:  mildly overweight white woman in some distress and discomfort Head:  Normocephalic and atraumatic without obvious abnormalities. No apparent alopecia or balding. Lungs:  normal respiratory effort and normal breath sounds.   Heart:  normal rate, regular rhythm, no rub, and no JVD.   Abdomen:  very tender in th eleft flank., tender in the left abdomen and suprapubic area. Msk:  no joint warmth and no redness over joints.   Pulses:  2+ radial pulse Neurologic:  alert & oriented X3, cranial nerves II-XII intact, strength normal in all extremities, and gait normal.   Skin:  turgor normal, color normal, and no rashes.   Psych:  Oriented X3, memory intact for recent and remote, normally interactive, and good eye contact.     Impression & Recommendations:  Problem # 1:  UTI (ICD-599.0)  paitent with symptoms to suggest an acute infection, possible pyelonephritis. She is still very tender after 3 days of antibiotics raising concern for cipro resistance.  Plan - U/A for culture           septra DS two times a day x 7           call if not seeing decreased pain in 2-3 days.   Her updated medication list for this problem includes:    Smz-tmp Ds 800-160 Mg Tabs (Sulfamethoxazole-trimethoprim) .Marland Kitchen... 1 by mouth two times a day x 7 days for uti  Orders: TLB-Udip w/ Micro (81001-URINE) T-Urine Culture (Spectrum Order) [id number])  addendum- U/A micrfo isnegative!  Problem # 2:  DEPRESSION/ANXIETY (ICD-300.4) Seems to be doing better. Her home situation has changed with her daughter and son-in-law moving out. 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 She is still working.    Plan - continue current medications           still encouraged her to consider counseling.   Problem # 3:  ATTENTION DEFICIT DISORDER, ADULT (ICD-314.00) Renewed her medications for 3 months.  Problem # 4:  CHRONIC PAIN SYNDROME (ICD-338.4) She continues to follow with her pain specialist. Her implanted stimulator has been adjusted and is working better.  Complete Medication List: 1)  Alprazolam 1 Mg Tabs (Alprazolam) .... Takes 3 to 5 times a day for chest pain, anxiety and sleep. 2)  Promethegan 25 Mg Supp (Promethazine hcl) .Marland Kitchen.. 1 per rectum twice a week as needed 3)  Promethazine Hcl 25 Mg Tabs (Promethazine hcl) .... 1/2 or 1 q 6 as needed 4)  Fentanyl 100 Mcg/hr Pt72 (Fentanyl) .... Apply q 48 hrs 5)  Vicodin Es 7.5-750 Mg Tabs (Hydrocodone-acetaminophen) .... Take 1 tablet by mouth four times a day 6)  Tramadol Hcl 50 Mg Tabs (Tramadol hcl) .... Three times a day as needed breakthrough pain 7)  Miralax Powd (Polyethylene glycol 3350) .Marland Kitchen.. 17g once daily as needed 8)  Neurontin 300 Mg Caps (Gabapentin) .... Take 2-3 tablets daily as needed 9)  Prilosec Otc 20 Mg Tbec (Omeprazole magnesium) .... Take twice daily 10)  Effexor Xr 150 Mg Xr24h-cap (Venlafaxine hcl) .... 2 caps by mouth once daily 11)  Excedrin Migraine 250-250-65 Mg Tabs (Aspirin-acetaminophen-caffeine) .... As needed 12)  Dicyclomine Hcl 10 Mg Caps (Dicyclomine hcl) .Marland Kitchen.. 1 by mouth three times a day as needed ibs symptoms 13)  Amphetamine-dextroamphetamine 30 Mg Xr24h-cap (Amphetamine-dextroamphetamine) .Marland Kitchen.. 1 by mouth once daily ok to fill on or after 02/19/2010 14)  Premarin 0.9 Mg Tabs (Estrogens conjugated) .Marland Kitchen.. 1 by mouth once daily 15)  Adderall Xr 25 Mg Xr24h-cap (Amphetamine-dextroamphetamine) .... Take 1 by mouth once daily please fill after 07/05/09 16)  Oxycodone Hcl 15 Mg Tabs (Oxycodone hcl) .Marland Kitchen.. 1 tab q 4 to 6 hours as needed 17)  Tizanidine Hcl 4 Mg Tabs (Tizanidine hcl) .Marland Kitchen.. 1 to 2 tabs at  bedtime 18)  Smz-tmp Ds 800-160 Mg Tabs (Sulfamethoxazole-trimethoprim) .Marland Kitchen.. 1 by mouth two times a day x 7 days for uti  Other Orders: Flu Vaccine 39yrs + (84132) Admin 1st Vaccine (44010) Tdap => 44yrs IM (27253) Admin of Any Addtl Vaccine (66440) Prescriptions: AMPHETAMINE-DEXTROAMPHETAMINE 30 MG XR24H-CAP (AMPHETAMINE-DEXTROAMPHETAMINE) 1 by mouth once daily ok to fill on or after 02/19/2010  #30 x 0   Entered by:   Lanier Prude, CMA(AAMA)   Authorized by:   Jacques Navy MD   Signed by:   Lanier Prude, CMA(AAMA) on 10/06/2010   Method used:   Print then Give to Patient   RxID:   [id number] AMPHETAMINE-DEXTROAMPHETAMINE 30 MG XR24H-CAP (AMPHETAMINE-DEXTROAMPHETAMINE) 1 by mouth once daily ok to fill on or after 01/20/2010  #30 x 0   Entered by:   Lanier Prude, CMA(AAMA)   Authorized by:   Jacques Navy MD   Signed by:   Lanier Prude, CMA(AAMA) on 10/06/2010   Method used:   Print then Give to Patient   RxID:   [id number] AMPHETAMINE-DEXTROAMPHETAMINE 30 MG XR24H-CAP (AMPHETAMINE-DEXTROAMPHETAMINE) 1 by mouth once daily ok to fill on or after 12/20/2009  #30 x 0   Entered by:   Lanier Prude, CMA(AAMA)   Authorized by:   Jacques Navy MD   Signed by:   Lanier Prude, CMA(AAMA) on 10/06/2010   Method used:   Print then Give to Patient   RxID:   [id number] SMZ-TMP DS 800-160 MG TABS (SULFAMETHOXAZOLE-TRIMETHOPRIM) 1 by mouth two times a day x 7 days for UTI  #14 x 1   Entered and Authorized by:   Jacques Navy MD   Signed by:   Jacques Navy MD on 10/06/2010   Method used:   Electronically to        [facility]       [address]       [place], [place]  [address]       Ph: [phone]       Fax: [phone]   RxID:   [id number]    Orders Added: 1)  Flu Vaccine 9yrs + [09381] 2)  Admin 1st Vaccine [90471] 3)  Tdap => 8yrs IM [82993] 4)  Admin of Any Addtl Vaccine  [90472] 5)  TLB-Udip w/ Micro [81001-URINE] 6)  T-Urine  Culture (Spectrum Order) [id number] 7)  Est. Patient Level III [09811]   Immunizations Administered:  Influenza Vaccine # 1:    Vaccine Type: Fluvax 3+    Site: right deltoid    Mfr: Sanofi Pasteur    Dose: 0.5 ml    Route: IM    Given by: Lanier Prude, CMA(AAMA)    Exp. Date: 03/25/2011    Lot #: [id number]    VIS given: 04/19/10 version given October 06, 2010.  Tetanus Vaccine:    Vaccine Type: Tdap    Site: left deltoid    Mfr: GlaxoSmithKline    Dose: 0.5 ml    Route: IM    Given by: Lanier Prude, CMA(AAMA)    Exp. Date: 07/14/2012    Lot #: [id number]    VIS given: 08/12/08 version given October 06, 2010.   Immunizations Administered:  Influenza Vaccine # 1:    Vaccine Type: Fluvax 3+    Site: right deltoid    Mfr: Sanofi Pasteur    Dose: 0.5 ml    Route: IM    Given by: Lanier Prude, CMA(AAMA)    Exp. Date: 03/25/2011    Lot #: [id number]    VIS given: 04/19/10 version given October 06, 2010.  Tetanus Vaccine:    Vaccine Type: Tdap    Site: left deltoid    Mfr: GlaxoSmithKline    Dose: 0.5 ml    Route: IM    Given by: Lanier Prude, CMA(AAMA)    Exp. Date: 07/14/2012    Lot #: [id number]    VIS given: 08/12/08 version given October 06, 2010.

## 2010-10-27 NOTE — Progress Notes (Signed)
Summary: ADDERALL  Phone Note Call from Patient   Summary of Call: Pt called, needs rx's for adderall for January & Feb, has rx's for March April & May. Looking into EMR records. Pt last was given an rx from Dr Debby Bud for adderall Feb of 2011. She also has 2 doses of rx on med list. Pt did not know what dose she needed, just asked for last dose given. Rx's prepared and ready.   Pt will need to be informed to bring in rx's given at last office visit back in.   Initial call taken by: Lamar Sprinkles, CMA,  October 10, 2010 11:31 AM  Follow-up for Phone Call        Pt informed, she will come by tomorrow and bring in the 3 rx's given at last office visit to pick up new rx's written today. New rx's at my desk Follow-up by: Lamar Sprinkles, CMA,  October 10, 2010 12:43 PM    New/Updated Medications: AMPHETAMINE-DEXTROAMPHETAMINE 30 MG XR24H-CAP (AMPHETAMINE-DEXTROAMPHETAMINE) 1 by mouth once daily ok to fill on or after 12/09/2010 AMPHETAMINE-DEXTROAMPHETAMINE 30 MG XR24H-CAP (AMPHETAMINE-DEXTROAMPHETAMINE) 1 by mouth once daily ok to fill on or after 11/10/2010 AMPHETAMINE-DEXTROAMPHETAMINE 30 MG XR24H-CAP (AMPHETAMINE-DEXTROAMPHETAMINE) 1 by mouth once daily ok to fill on or after 10/10/2010 Prescriptions: AMPHETAMINE-DEXTROAMPHETAMINE 30 MG XR24H-CAP (AMPHETAMINE-DEXTROAMPHETAMINE) 1 by mouth once daily ok to fill on or after 12/09/2010  #30 x 0   Entered by:   Lamar Sprinkles, CMA   Authorized by:   Jacques Navy MD   Signed by:   Lamar Sprinkles, CMA on 10/10/2010   Method used:   Print then Give to Patient   RxID:   [id number] AMPHETAMINE-DEXTROAMPHETAMINE 30 MG XR24H-CAP (AMPHETAMINE-DEXTROAMPHETAMINE) 1 by mouth once daily ok to fill on or after 11/10/2010  #30 x 0   Entered by:   Lamar Sprinkles, CMA   Authorized by:   Jacques Navy MD   Signed by:   Lamar Sprinkles, CMA on 10/10/2010   Method used:   Print then Give to Patient   RxID:    [id number] AMPHETAMINE-DEXTROAMPHETAMINE 30 MG XR24H-CAP (AMPHETAMINE-DEXTROAMPHETAMINE) 1 by mouth once daily ok to fill on or after 10/10/2010  #30 x 0   Entered by:   Lamar Sprinkles, CMA   Authorized by:   Jacques Navy MD   Signed by:   Lamar Sprinkles, CMA on 10/10/2010   Method used:   Print then Give to Patient   RxID:   [id number]

## 2010-11-02 NOTE — Progress Notes (Signed)
Summary: Stool concern  Phone Note Call from Patient   Summary of Call: Pt left u/a specimen today. She left a note stating that she notices what looks like black peper in her stool off and on.  Initial call taken by: Lamar Sprinkles, CMA,  October 25, 2010 1:29 PM  Follow-up for Phone Call        U/A negative - no hint of infection.  For pepper stools - she can pick up stool cards at her convenience for testing for blood.  Follow-up by: Jacques Navy MD,  October 25, 2010 5:44 PM  Additional Follow-up for Phone Call Additional follow up Details #1::        left mess to call office back, I will mail out stool specimen cards to pt Additional Follow-up by: Lamar Sprinkles, CMA,  October 25, 2010 6:46 PM    Additional Follow-up for Phone Call Additional follow up Details #2::    Pt informed, mailed hemocult cards Follow-up by: Lamar Sprinkles, CMA,  October 26, 2010 6:04 PM

## 2010-12-09 ENCOUNTER — Telehealth: Payer: Self-pay | Admitting: Internal Medicine

## 2010-12-13 NOTE — Progress Notes (Signed)
Summary: Refill request  Phone Note Refill Request Message from:  Fax from Pharmacy  Refills Requested: Medication #1:  ALPRAZOLAM 1 MG TABS takes 3 to 5 times a day for chest pain [facility] [place], [place] [phone]   Fax: [phone]  Initial call taken by: Burnard Leigh [facility]),  December 09, 2010 3:11 PM Caller: [facility] Reason for Call: Needs renewal Summary of Call: Alprazolam 1mg . Tablets requested Sig: Take 1 tablet by mouth three times a day to five times a day for chest pain #150   Follow-up for Phone Call        ok for refill x 3 Follow-up by: Jacques Navy MD,  December 09, 2010 4:58 PM    Prescriptions: ALPRAZOLAM 1 MG TABS (ALPRAZOLAM) takes 3 to 5 times a day for chest pain, anxiety and sleep.  #150 x 3   Entered by:   Lamar Sprinkles, CMA   Authorized by:   Jacques Navy MD   Signed by:   Lamar Sprinkles, CMA on 12/09/2010   Method used:   Telephoned to ...       [facility] ([facility]       [address]       [place], [place]  [address]       Ph: [phone]       Fax: [phone]   RxID:   [id number]

## 2010-12-16 ENCOUNTER — Other Ambulatory Visit: Payer: Self-pay | Admitting: Internal Medicine

## 2010-12-16 ENCOUNTER — Other Ambulatory Visit: Payer: Self-pay

## 2010-12-16 LAB — HEMOCCULT SLIDES (X 3 CARDS)
OCCULT 2: NEGATIVE
OCCULT 3: NEGATIVE

## 2010-12-27 ENCOUNTER — Telehealth: Payer: Self-pay | Admitting: *Deleted

## 2010-12-27 MED ORDER — AMPHETAMINE-DEXTROAMPHET ER 30 MG PO CP24: 30.0000 mg | ORAL_CAPSULE | ORAL | Status: DC

## 2010-12-27 NOTE — Telephone Encounter (Signed)
[facility] called - Pt told them she dropped off 2 rx's for adderall xr 30mg  1qd. They have misplaced these, ok to reprint rf's for pt?

## 2010-12-27 NOTE — Telephone Encounter (Signed)
Wow - the pharmacy admitting that they lost the prescriptions must be a first.  Ok to reprint Rx's

## 2010-12-27 NOTE — Telephone Encounter (Signed)
Pt aware, rx's ready for pick up tomorrow am.

## 2010-12-29 ENCOUNTER — Telehealth: Payer: Self-pay | Admitting: *Deleted

## 2010-12-29 NOTE — Telephone Encounter (Signed)
Pt left vm - Adderall needs PA. Have you seen this?

## 2010-12-29 NOTE — Telephone Encounter (Signed)
Requested PA Form from [employer] @ [phone]

## 2011-01-02 NOTE — Telephone Encounter (Signed)
Received & forwarded PA from to MD for completion. PA Approval received & faxed to Pharmacy. Pt Informed.

## 2011-01-16 ENCOUNTER — Other Ambulatory Visit: Payer: Self-pay | Admitting: Internal Medicine

## 2011-02-07 NOTE — Assessment & Plan Note (Signed)
[facility]                           PRIMARY CARE OFFICE NOTE   NAME:[name], [name] NAWABI                      MRN:          [id number]  DATE:04/24/2007                            DOB:          May 02, 1956    [name] [name] is a 55 year old woman followed for multiple medical  problems including chronic back pain, left upper extremity  radiculopathy, significant anxiety and depression, history of jaw  fracture, history of arthritis, history of renal calculi.   SURGICAL HISTORY:  1. TAH-BSO.  2. Cervical fusion in 1990.   Patient was last seen in the office November 26, 2006, please see that  complete dictated note.   Patient presents today saying she is due for a DEXA scan and is  interested in having this scheduled.   Patient is concerned about hormone replacement therapy.  She reports she  is having no relief of her symptoms of frequent sweats and flushes.  She  has been on Syntest HS which is an estrogen-testosterone preparation.  She does report she has some atrophic vaginitis and dyspareunia.   Hypothyroid disease, patient has no record to support diagnosis in the  past in looking at my own notes.  She had been on Synthroid replacement,  currently is stopped.  She does have malaise, fatigue as well thermal  regulation issues.   Psychiatric, patient has had a longstanding history of anxiety and  depression.  I have tried to manage this in the past and she has failed  tricyclic antidepressants including Elavil and Pamelor.  Zoloft cause GI  problems although did give her some relief from her depression.  Wellbutrin did seem to give her good relief from her anxiety and  depression but seemed to cause more GI problems.  She has failed  Effexor, she has failed Cymbalta.  Patient has been seeing Dr. Milagros Evener.  Because of her last bad reaction to Cymbalta she has been without  any antidepressant medications for 2 weeks.   Pain management, the  patient is followed by a pain management physician  who prescribes Duragesic patch 100 mcg applied q.48 hours and Vicodin  7.5/750 which she takes 4 times a day.  She also uses Xanax 1 mg nightly  p.r.n. for her discomfort.   GI, patient with significant problems with IBS type symptoms of  abdominal bloating, pain and discomfort.   CURRENT MEDICATIONS:  1. Duragesic patch as noted.  2. Xanax 1 mg daily as noted.  3. Neurontin 300 mg 2-3 times daily.  4. Fish oil.  5. B-12 complex.  6. Vicodin.   VITAL SIGNS:  Temperature was 98.1, blood pressure 112/74, pulse 66,  weight 141.  GENERAL APPEARANCE:  This is a chronically ill-appearing patient but she  is in no distress.  No further examination conducted.   ASSESSMENT/PLAN:  1. Bone health, patient will be scheduled for DEXA scan.  2. Hormone replacement therapy, at this time I have told the patient      to discontinue medications since she has been getting no benefit.      If she develops progressive dyspareunia  and/or atrophic vaginitis      we recommend Premarin vaginal cream.  3. Endocrine, patient will be sent to the laboratory for TSH and free      T4, and will make medical recommendations based on results.  4. Psychiatric, patient with a complex psychiatric history.  At this      time I have told her to continue taking Xanax.  I would prefer that      she contact Dr. Milagros Evener in regards to ongoing trials of      antidepressant agents.  By her history she did best on Zoloft and      Wellbutrin but this caused her gastrointestinal distress as well as      not complete results.  5. Pain management, stable.  6. Gastrointestinal, patient with probable irritable bowel syndrome      given the nature of symptoms.  Plan is to have her take a bulk      laxative such as Benefiber once a day.  She should use Prilosec OTC      20 mg q.a.m.  She is advised to take Bentyl 10 mg t.i.d. on a      p.r.n. basis for cramping, bloating  and significant discomfort.   Patient is asked to return to see me in approximately 2 weeks for  followup given the changes that are made.  She is to contact Dr.  Milagros Evener tomorrow.     Rosalyn Gess Norins, MD  Electronically Signed    MEN/MedQ  DD: 04/25/2007  DT: 04/25/2007  Job #: [id number]   cc:   Tyler Pita  Milagros Evener, M.D.

## 2011-02-07 NOTE — Assessment & Plan Note (Signed)
[facility]                         GASTROENTEROLOGY OFFICE NOTE   NAME:[name] [name], [name] [name]                      MRN:          [id number]  DATE:07/03/2007                            DOB:          [birth year]    REQUESTING PHYSICIAN:  Dr. Illene Regulus.   REASON FOR CONSULTATION:  Abdominal pain and nausea.   ASSESSMENT:  This is a 55 year old white woman with functional bowel  disturbance. She has known irritable bowel syndrome. At this time, she  has a lot of problems with post-prandial epigastric pain, distress, and  nausea. She does not want to eat because of this. There is early satiety  as well. An EGD in August when she was admitted for abdominal pain  demonstrated an asymptomatic lower esophageal ring. Though this is a bit  more severe than what she has had in the past, really there have been  chronic recurrent problems as outlined the chart.   PLAN:  1. Trial of Iberogast, an herbal supplement that has been shown in      [place] to help with functional dyspepsia.  2. She is going to really need to see a psychologist and perhaps, a      psychiatrist. I know that she has tried that before, but she has      significant anhedonia, loss of libido, as well as concomitant      anxiety and shakes. She is not suicidal or homicidal. We will      discuss with Dr. Debby Bud. She has failed or had intolerances to many      antidepressants, including Cymbalta, which might be good. I have      some thought towards considering mirtazapine or Remeron, as that      helps nausea and eating problems, but we will try this approach      first.  3. I will see her back in a month.  4. I considered retrying BuSpar, but she went on that at 5 mg t.i.d.      after her hospitalization in August and then developed nightmares      and a feeling of dizziness with that.  5. We will check tissue transglutaminase antibody and IGA level.   HISTORY:  As above. This lady was seen  by me a number of years ago. She  was hospitalized recently with epigastric pain. She has had chronic  alternating bowel habits, a lot of nausea, right upper quadrant and  epigastric pain. In the past, she had an abnormal HIDA scan ejection  fraction though recently it was normal. Abdominal ultrasound has been  unrevealing. She had an EGD by Dr. Marina Goodell as mentioned above in 04/2007. I  had previously performed a colonoscopy in 08/2002 which showed external  hemorrhoids and a marked irritable bowel response, she also had similar  findings on her EGD at that time, including a small hiatal hernia, as we  know today. She is not really malnourished or losing weight. When she  eats bread, she has problems, and wonders if she could have celiac  disease. There is no dysphagia. There is no real true  weight loss. Bowel  habits are alternating. She is essentially somewhat afraid to eat  because of her symptoms, but she is not losing weight at this time. All  she wants to do is lay in bed with the heating pad on her abdomen for  chronic pain. She has had some fecal incontinence at times as well.   MEDICATIONS:  1. Duragesic patch 100 mcg every 48 hours.  2. Xanax 1 mg b.i.d.  3. Fish oil daily.  4. Vitamin B complex.  5. Estrogen daily.  6. Zoloft 20 mg daily.  7. Dicyclomine 10 mg t.i.d.  8. Prilosec OTC daily.  9. Vicodin p.r.n.  10.Phenergan suppositories for severe nausea.  11.Tramadol p.r.n.  12.MiraLax p.r.n.   ALLERGIES:  PENICILLIN, AMPICILLIN, LIPITOR, VYTORIN, BUSPAR with the  nightmares, she also had intolerance of CYMBALTA.   PAST MEDICAL HISTORY:  1. Depression and anxiety.  2. Chronic low back pain treated by Dr. Yetta Flock in [place].  3. Prior hysterectomy.  4. Prior appendectomy.  5. Lower esophageal ring.  6. Hemorrhoids.  7. Rotator cuff tear.  8. She has had a bilateral salpingo-oophorectomy as well.  9. Right upper extremity radiculopathy.  10.Osteoarthritis.   11.Nephrolithiasis.   FAMILY HISTORY, SOCIAL HISTORY, REVIEW OF SYSTEMS:  She is a  Interior and spatial designer. She is having difficulty working because of her anxiety,  shakes, and tremors that she gets when she holds her hands out and she  is asking if I could treat those, and I have deferred to Dr. Debby Bud for  that. See medical history form otherwise.   PHYSICAL EXAMINATION:  GENERAL:  Well developed, well nourished white  woman in no acute distress.  VITAL SIGNS:  Blood pressure 118/66, height 5 foot 3 inches, weight 137  pounds, pulse 80.  EYES:  Anicteric.  NECK:  Supple. No thyromegaly or mass.  MOUTH:  Free of lesions.  CHEST:  Clear.  BACK:  No CVA tenderness.  HEART:  S1, S2. There is a 2/6 systolic murmur with a mid-systolic click  consistent with mitral valve prolapse.  ABDOMEN:  Soft and diffusely tender. She closes her eyes during the  abdominal exam. There is organomegaly or mass. Deep palpation is  allowed.  LOWER EXTREMITIES:  Free edema.  SKIN:  Without acute rash. There are some mild acne form changes  scattered in some areas of the skin.  NEUROLOGIC:  Awake, alert, and oriented x3. She appears somewhat  anxious, but overall, affect appears appropriate. Certainly is not very  flat at this time.   I appreciate the opportunity to care for this patient. I have reviewed  the hospitalization records. I have reviewed the recent hospice notes  and laboratory testing in the chart provided by Dr. Debby Bud in the  [facility] chart.     Iva Boop, MD,FACG  Electronically Signed    CEG/MedQ  DD: 07/03/2007  DT: 07/04/2007  Job #: [id number]   cc:   Rosalyn Gess. Norins, MD

## 2011-02-07 NOTE — Discharge Summary (Signed)
[name] [name], [name] [name]               ACCOUNT NO.:  [id number]   MEDICAL RECORD NO.:  [id number]          PATIENT TYPE:  INP   LOCATION:  1332                         FACILITY:  [facility]   PHYSICIAN:  Stacie Glaze, MD    DATE OF BIRTH:  08/03/1956   DATE OF ADMISSION:  05/09/2007  DATE OF DISCHARGE:  05/12/2007                         DISCHARGE SUMMARY - REFERRING   ADMISSION DIAGNOSIS:  Abdominal pain.   DISCHARGE DIAGNOSES:  Functional abdominal pain and depression.   HOSPITAL COURSE:  The patient has a complex history, which was reviewed  by her primary care physician but presented with 6 days of acute  epigastric pain and lower quadrant pain, nausea, but no emesis. On  physical examination upon admission, she was diffusely tender in her  abdomen and pelvis but had no guarding. Initial laboratory evaluation  was unremarkable and gastroenterology consult was obtained.  Gastroenterology reviewed the patient's history and previous workup by  Dr. Leone Payor in 2003 and agreed that it would be appropriate to proceed  with an EGD. EGD revealed a slight lower esophageal stricture but a  normal esophagus to jejunum in terms of tissue. There was no cause for  the pain found and the gastroenterology consult felt that the abdominal  pain may be functional. In review of the patient's long standing history  of depression and anxiety, the patient has been on multiple medications  including Pamelor, Zoloft, Wellbutrin, Effexor, Cymbalta, and because of  that, the patient is fearful of many antidepressants.   During this hospitalization, we began BuSpar 5 mg t.i.d., Bentyl 10 mg  t.i.d., and started Remeron 30 mg p.o. q.h.s. for her depression adn  anxiety and probable functional abdominal pain. Although the diagnosis  is functional, continued close followup with her primary care physician  is warranted.   DISPOSITION:  The patient is stable for discharge.   DISCHARGE MEDICATIONS:  1. BuSpar 5 mg  t.i.d.  2. Bentyl 10 mg t.i.d.  3. Remeron 30 mg p.o. q.h.s.      Stacie Glaze, MD  Electronically Signed     JEJ/MEDQ  D:  05/12/2007  T:  05/12/2007  Job:  [id number]   cc:   Rosalyn Gess. Norins, MD  [address]  [place]  [place] [address]

## 2011-02-10 NOTE — Assessment & Plan Note (Signed)
[facility]                           PRIMARY CARE OFFICE NOTE   NAME:[name] [name], [name] [name]                      MRN:          [id number]  DATE:11/26/2006                            DOB:          05-16-1956    [name] [name] is a 55 year old Caucasian woman followed for multiple  medical problems and presents for follow up evaluation and exam.  She  was last seen November 08, 2006 for a probable UTI.  She reports that  she took Cipro 250 b.i.d. for 7 days and felt better and actually felt  great at the completion of antibiotics.  Since that time, she reports  that she had a recurrence of symptoms including back pain, nausea,  dysuria, generalized weakness and has benefits bed bound.   The patient is asking questions about chronic fatigue syndrome, as to  whether this may be part of her problem.  She is provided with patient  information, chapter from up-to-date in regards to CFS.   PSYCHIATRIC:  The patient does have an appointment to see [name] [name],  M.D. in April.  At this time, she continues on her present medications,  but does report Zoloft 200 q.h.s. causes her significant problems with  nausea.   PAST MEDICAL HISTORY:   SURGICAL:  1. Adhesions at age 20.  2. Exploratory laparotomy at age 38.  3. Gynecologic surgery at age 7.  17. Total hysterectomy at age 47 for endometriosis.  5. Jaw surgery at age 70.  6. Repair of cervical degenerative disk with anterior cervical      approach at age 4.  7. Repair of a rotator cuff tear.   MEDICAL ILLNESSES:  1. Usual childhood disease.  2. Chronic back pain with radiculopathy.  3. Arthritis.  4. History of renal calculi.  5. History of anxiety and depression.   PHYSICIAN ROSTER:  1. Neurosurgery - [name] [name], M.D.  2. Pain management with [facility] and others      currently not on treatment.  3. Psychiatric - [name] [name], M.D.  4. Orthopedics - [name] [name], M.D.   CURRENT MEDICATIONS:  1. Duragesic patch 100 mcg [name] other day.  2. Zoloft 200 mg q.h.s.  3. Xanax 1 mg daily.  4. Synthroid.  5. Wellbutrin XL 300 mg q.a.m.  6. Syntest HS three times a week.  7. Neurontin 300 mg 2-3 times daily.  8. Fish oil.  9. B-12 complex.  10.Vicodin 7.5/750 p.r.n.  11.AcipHex 20 mg q.a.m.   FAMILY HISTORY:  Positive for chronic obstructive pulmonary disease in  her father, heart failure and MI in her father.  Mother died at age 79  of MI.  The patient has multiple other secondary kinship with early  cardiac death.  No history of colon cancer.  No history of breast  cancer.   SOCIAL HISTORY:  The patient is married.  She has 2 children from her  first marriage.  Her present marriage is stable and in good health.  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   REVIEW OF SYSTEMS:  The patient has  had no fevers, sweats or chills.  Her weight has been stable.  No ophthalmologic, ENT, cardiovascular or  respirations complaints.  GI is a chronic complaint with abdominal pain  that is intermittent.  GU:  The patient with recurrent symptoms.  I have  asked for a urinalysis and urine culture.  No musculoskeletal,  dermatologic or neurologic active complaints, except for chronic  complaints as listed above.   PHYSICAL EXAMINATION:  VITAL SIGNS:  Temperature was 97.5, blood  pressure 115/75, pulse 72, weight 144.  GENERAL APPEARANCE:  A well-nourished, well-developed woman in no acute  distress.  HEENT:  Normocephalic and atraumatic.  External auditory canals and  tympanic membranes were normal.  Oropharynx with native dentition.  No  buccal or palatal lesions were noted.  Posterior pharynx was clear.  Conjunctivae and sclerae were clear.  Pupils equal, round and reactive  to light.  Extraocular movements intact.  Funduscopic exam was  unremarkable.  NECK:  Supple without thyromegaly.  No adenopathy was noted in the  cervical, supraclavicular,  axillary regions.  CHEST:  No CVA tenderness.  No deformity is noted.  LUNGS:  Clear with no rales, wheezes or rhonchi.  BREASTS:  Skin was normal.  Nipples without discharge.  No fixed, mass,  lesion or abnormalities were noted, but she is tender throughout.  CARDIOVASCULAR:  There were 2+ radial pulses.  No JVD or carotid bruits.  She had a quiet precordium with regular rate and rhythm without murmurs,  rubs, or gallops.  ABDOMEN:  Positive bowel sounds in all 4 quadrants.  She had diffuse  tenderness, but worse in the epigastrium.  There was no  organosplenomegaly.  The patient has well healed surgical scars.  PELVIC/RECTAL:  Deferred.  EXTREMITIES:  Without clubbing, cyanosis, edema or deformity.  NEUROLOGIC:  Grossly nonfocal with no abnormalities noted.   LABORATORIES:  Hemoglobin 15.3 gm, white count 7900 with a normal  differential.  Urinalysis was negative.  Chemistries with a glucose that  was normal at 101.  Sodium and potassium chloride were normal.  Kidney  function normal with a creatinine of 0.9 and a GFR of 70 mm per minute.  Thyroid function normal with a TSH of 1.49.  LFT's were normal.  Cholesterol was 325.  Triglycerides 209.  HDL was 40.8.  LDL cholesterol  was 229.   ASSESSMENT AND PLAN:  1. Chronic back pain.  This is a stable problem with the patient      having good days and bad days.  At this point, she will continue on      her present pain regimen which seems to be working well.  2. Hypothyroid disease.  The patient's TSH is stable.  She will      continue on her present dose of Syntest HS three times a week.  3. Psychiatric.  The patient with ongoing problems.  At this point,      she is intolerant of Zoloft because of nausea.  The plan is to give      her a trial of Lexapro at 20 mg p.o. daily, and samples were      provided for 3 weeks.  She did notify me as to response, and if she     does well will provide generic citalopram at a similar dose.  The       patient is to keep her appointment with Dr. Milagros [name].  4. Lipids.  The patient has a markedly elevated cholesterol.  This is  unchanged from 2005.  The patient will be notified by phone of her      lab results and the need to be on statin drugs, given her very      elevated LDL and her strong positive family history for heart      disease.  5. Health maintenance.  The patient had a mammogram performed July 11, 2006 which was normal.  The last colonoscopy was August 25, 2002 that was positive for hemorrhoids only.  She will be a      candidate for follow up in 2013.  The patient is status post total      abdominal hysterectomy/bilateral salpingo-oophorectomy in regards      to gynecologic health, with the last Pap smear from 1998 with no      vaginal or vulvar complaints.   SUMMARY:  A pleasant woman with untreated hyperlipidemia who will need  to be started on statin medication.  She will return to see me on a  p.r.n. basis.     Rosalyn Gess Norins, MD  Electronically Signed    MEN/MedQ  DD: 11/27/2006  DT: 11/27/2006  Job #: [id number]   cc:   Tyler Pita

## 2011-02-10 NOTE — H&P (Signed)
[facility]  Patient:    [name] [name] Visit Number: [id number] MRN: [id number]          Service Type: Attending:  Javier Docker, M.D. Dictated by:   Alexzandrew L. Perkins, P.A.-C. Adm. Date:  08/14/01   CC:         Rosalyn Gess. Norins, M.D. [facility]   History and Physical  DATE OF OFFICE VISIT AND HISTORY AND PHYSICAL:  08/07/01  CHIEF COMPLAINT:  Right shoulder pain.  HISTORY OF PRESENT ILLNESS:  The patient is a 55 year old female who has been seen in consultation by Javier Docker, M.D., for right shoulder pain.  She has been seen and evaluated for continued pain.  She has been on anti-inflammatories.  Her medications are analgesics in the past.  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  She has undergone subacromial shoulder injections with very little relief.  She has had continued pain.  She was therefore sent for a right shoulder MRI, which showed some tendonopathy in the supraspinatus and infraspinatus tendons with also some AC degenerative changes.  Due to the fact that she has improved with conservative measures, the risks and benefits have been discussed with the patient for surgical intervention.  The patient has elected to proceed with surgery.  ALLERGIES:  PENICILLIN causes a rash.  CURRENT MEDICATIONS: 1. Neurontin 600 mg two tablets three times a day. 2. Hydrocodone 7.5 mg one every six hours p.r.n. pain. 3. Zoloft 100 mg two tablets every day. 4. Ambien 10 mg one p.o. q.h.s.  PAST MEDICAL HISTORY: 1. Renal calculi. 2. History of jaw fracture. 3. Arthritis. 4. Depression.  PAST SURGICAL HISTORY: 1. Surgery for adhesions at age 59. 2. Exploratory abdominal surgery at age 74. 3. Partial hysterectomy at age 44. 41. Total hysterectomy at age 30. 5. Jaw surgery at age 55. 5. Neck surgery for degenerative disk at age 37.  SOCIAL HISTORY:  She is divorced.  She has two  children.  Denies the use of tobacco products or alcohol products.  She currently is working as a Interior and spatial designer.  FAMILY HISTORY:  Father living at age 50 with a history of COPD, heart failure and MI.  Mother deceased at age 30 with a history of two MIs.  REVIEW OF SYSTEMS:  General:  No fever, chills, or night sweats.  Neurologic: No seizures, syncope, or paralysis.  Respiratory:  The patient has had a cold for approximately two weeks and has been on medications.  This is a resolving. Denies any productive cough or shortness of breath at this time. Cardiovascular:  No chest pain, angina, or orthopnea.  GI:  No nausea, vomiting, diarrhea, or constipation.  GU:  No dysuria, hematuria, or discharge.  Musculoskeletal:  Pertinent to the right shoulder found in the history of present illness.  PHYSICAL EXAMINATION:  VITAL SIGNS:  Pulse 76, respirations 16, blood pressure 128/74.  GENERAL APPEARANCE:  The patient is a 55 year old white female, well nourished, well developed, and appears to be in no acute distress.  Somewhat of a flat affect on exam.  Appears to be an average historian.  Appears her stated age.  HEENT:  Normocephalic and atraumatic.  Pupils round and reactive.  EOMs are intact.  NECK:  Supple.  No carotid bruits are appreciated.  CHEST:  Clear to auscultation in anterior and posterior chest walls.  No rhonchi, rales, or other adventitious sounds appreciated.  HEART:  Regular  rate and rhythm.  No murmur.  S1 and S2 noted.  ABDOMEN:  Soft.  Bowel sounds present.  No rebound or guarding.  Nontender abdomen.  RECTAL:  Not done, not pertinent to present illness.  BREASTS:  Not done, not pertinent to present illness.  GENITALIA:  Not done, not pertinent to present illness.  EXTREMITIES:  Significant to the right upper extremity.  She has a tender AC joint on palpation with a positive crossover maneuver and positive impingement signs on passive range of motion.  She is  tender about the trapezius.  Motor function is grossly intact throughout the right upper extremity.  IMPRESSION: 1. Partial rotator cuff tear. 2. Impingement syndrome, right shoulder. 3. Depression. 4. Arthritis. 5. History of renal calculi.  PLAN:  The patient will be admitted to [facility] to undergo open distal clavicle resection, acromioplasty, and possible rotator cuff repair. The patients medical physician is Rosalyn Gess. Norins, M.D.  Dr. Debby Bud will be notified of the room number on admission and will be consulted if needed for any medical assistance with this patient throughout the hospital course. Dictated by:   Alexzandrew L. Perkins, P.A.-C. Attending:  Javier Docker, M.D. DD:  08/09/01 TD:  08/09/01 Job: [id number]

## 2011-02-10 NOTE — Op Note (Signed)
[facility]  Patient:    [name] [name] Visit Number: [id number] MRN: [id number]          Service Type: Attending:  Javier Docker, M.D. Proc. Date: 08/14/01                             Operative Report  PREOPERATIVE DIAGNOSIS:  Acromioclavicular arthrosis         syndrome and possible rotator cuff tear.  POSTOPERATIVE DIAGNOSIS:  Acromioclavicular arthrosis        syndrome and possible rotator cuff tear, adhesiocapsulitis with no evidence of rotator cuff tear.  PROCEDURE PERFORMED:  Open distal clavicle resection, acromioplasty, and bursectomy.  ANESTHESIA:  General.  SURGEON:  Javier Docker, M.D.  INDICATION:  A 55 year old with refractory shoulder pain following a cervical diskectomy and fusion.  Felt to have a brachioplexopathy with MRI indicating tendinopathy of the rotator cuff.  Had a sign of impingement test with subacromial corticosteroid injection of lidocaine.  Operative intervention was indicated for decompression of the subacromial joint and distal clavicle resection due to pain and AC arthrosis on the MRI.  Evaluation of rotator cuff and repair as indicated.  It was felt that this should be done open so that no traction would be applied to the arm to aggravate any brachioplexopathy or neuropathy.  Risks and benefits discussed including bleeding, infection, injury to vascular structures, or change in symptoms.  DESCRIPTION OF PROCEDURE:  With the patient in the supine beach chair position, after induction of general anesthesia and 1 g Kefzol, the right shoulder was prepped and draped in the usual sterile fashion.  The acromion and clavicle were demarcated with a marker.  An incision was made over the anterior aspect of the acromion.  The subcutaneous tissue was dissected with electrocautery in order to achieve hemostasis.  The raphe between the anterolateral heads of the deltoid was identified and divided.   Subperiosteal elevator from the acromion.  The capsule over the [facility] joint was divided and significant degenerative changes noted at the [facility] joint and inferior projecting spur both the acromion and the AC side into the rotator cuff.  The distal clavicle was skeletonized using an elevator and oscillating saw utilized to remove the 5 mm of the distal clavicle.  A 3 mm Kerrison was utilized to undercut the clavicle with remaining spurs removed.  The acromion side was decompressed as well with 3 mm Kerrison.  The ligament was detached and excised.  A large anterior spur over the acromion was noted and an oscillating saw was utilized for form an anterior acromioplasty.  There were significant hypertrophic bursa noted in the adhesions throughout glenohumeral joint and was digitally manipulated and the bursa excised in its entirety.  Next, visualization of the rotator cuff revealed no evidence of a rotator cuff tear. Next, the wound was copiously irrigated.  Electrocautery was utilized to achieve hemostasis.  The capsule over the [facility] joint was repaired with #1 Vicryl in figure-of-eight sutures.  The raphe was repaired with the same and the flap of the deltoid was placed back over the acromion.  Excellent repair was noted. The trapezial fascia was repaired as well with 2-0 Vicryl simple sutures. There was no tension on the repair site.  Prior to this, she had good range of motion without impingement.  No traction was applied to the arm.  Next, the subcutaneous tissue was reapproximated with 2-0 Vicryl simple sutures.  The skin was  reapproximated with 4-0 subcuticular PDS.  The wound was reinforced with Steri-Strips.  A sterile dressing was applied.  The patient was placed in a sling, awakened without difficulty, and transported to the recovery room in satisfactory condition.  The patient tolerated the procedure well without complications. Attending:  Javier Docker, M.D. DD:  08/14/01 TD:   08/14/01 Job: [id number]

## 2011-02-10 NOTE — Letter (Signed)
May 08, 2006     [name] [name]  [address]  [place], [place] [address]   RE:  [name] [name], [name] [name]  MRN:  [id number]  /  DOB:  1956-08-13   Dear [name]:   Thank you very much for coming to see me today.   As I said, I have done some research in regard to the treatment of difficult  or refractory depression.  Based on the literature available to me and  without psychiatric consultation, one plan at this time would be to add a  tricyclic antidepressant to your current regimen.  This is both effective as  well as cost-effective.   Enclosed please find a prescription for Pamelor (nortriptyline) 10 mg.  You  would start taking this once every morning so that your regimen would be  Wellbutrin XL 300 mg in the morning plus nortriptyline 10 mg in the morning,  continuing to take the Zoloft 200 mg at bedtime.  I would like you to report  back to me your progress after 2-3 weeks on this regimen.  There is room to  increase the nortriptyline significantly.  Certainly, if you have any  problems with side effects,   increased nervousness, agitation, restlessness, insomnia or any other  adverse symptoms, please let me know immediately.   ENCLOSURE:  Prescription.   Sincerely,     Rosalyn Gess. Norins, MD   MEN/MedQ  DD:  05/08/2006  DT:  05/08/2006  Job #:  [id number]

## 2011-04-17 ENCOUNTER — Other Ambulatory Visit: Payer: Self-pay | Admitting: Internal Medicine

## 2011-04-18 ENCOUNTER — Telehealth: Payer: Self-pay | Admitting: *Deleted

## 2011-04-18 NOTE — Telephone Encounter (Signed)
Promethegan suppository 25 mg [last refill 12/22/08 #13x3; last OV 10/06/10]

## 2011-04-19 NOTE — Telephone Encounter (Signed)
Rx Done . 

## 2011-04-19 NOTE — Telephone Encounter (Signed)
Ok for prn refills 

## 2011-04-28 NOTE — Telephone Encounter (Signed)
re

## 2011-05-21 ENCOUNTER — Other Ambulatory Visit: Payer: Self-pay | Admitting: Internal Medicine

## 2011-05-22 ENCOUNTER — Telehealth: Payer: Self-pay | Admitting: *Deleted

## 2011-05-22 MED ORDER — ALPRAZOLAM 1 MG PO TABS: 1.0000 mg | ORAL_TABLET | Freq: Three times a day (TID) | ORAL | Status: DC

## 2011-05-22 NOTE — Telephone Encounter (Signed)
Refill request for Alprazolam 1 mg  QTY 150  SIG take one tablet by mouth three to five times a day for anxiety. Last dispensed 04/17/2011. Please Advise refills

## 2011-05-22 NOTE — Telephone Encounter (Signed)
Ok with one refill 

## 2011-07-07 ENCOUNTER — Telehealth: Payer: Self-pay

## 2011-07-07 MED ORDER — CIPROFLOXACIN HCL 250 MG PO TABS: 250.0000 mg | ORAL_TABLET | Freq: Two times a day (BID) | ORAL | Status: AC

## 2011-07-07 NOTE — Telephone Encounter (Signed)
Called pt. Will call cipro 250 mg bid x 7.

## 2011-07-07 NOTE — Telephone Encounter (Signed)
Pt called stating she is in need of ABX to treat UTI? - sxs frequency, odor and burning. Pt also states that she has been confined to bed because she has a prolapsed Uterus (has appt with GYN next week). Dose pt need to provide sample for UA?

## 2011-07-10 ENCOUNTER — Telehealth: Payer: Self-pay | Admitting: *Deleted

## 2011-07-10 LAB — COMPREHENSIVE METABOLIC PANEL
ALT: 17
AST: 21
Albumin: 3.9
Alkaline Phosphatase: 69
BUN: 7
CO2: 27
Calcium: 9.7
Chloride: 106
Creatinine, Ser: 0.84
GFR calc Af Amer: >60
GFR calc non Af Amer: >60
Glucose, Bld: 98
Potassium: 3.7
Sodium: 142
Total Bilirubin: 0.8
Total Protein: 7.1

## 2011-07-10 LAB — DIFFERENTIAL
Basophils Absolute: 0.1
Basophils Relative: 2 — ABNORMAL HIGH
Eosinophils Absolute: 0.1
Eosinophils Relative: 1
Lymphocytes Relative: 26
Lymphs Abs: 1.2
Monocytes Absolute: 0.3
Monocytes Relative: 7
Neutro Abs: 3.1
Neutrophils Relative %: 65

## 2011-07-10 LAB — CBC
HCT: 44.1
Hemoglobin: 15.1 — ABNORMAL HIGH
MCHC: 34.2
MCV: 94.5
Platelets: 333
RBC: 4.67
RDW: 12
WBC: 4.8

## 2011-07-10 LAB — URINALYSIS, ROUTINE W REFLEX MICROSCOPIC
Bilirubin Urine: NEGATIVE
Glucose, UA: NEGATIVE
Hgb urine dipstick: NEGATIVE
Ketones, ur: NEGATIVE
Nitrite: NEGATIVE
Protein, ur: NEGATIVE
Specific Gravity, Urine: 1.007
Urobilinogen, UA: 0.2
pH: 7

## 2011-07-10 LAB — AMYLASE: Amylase: 65

## 2011-07-10 LAB — LIPASE, BLOOD: Lipase: 28

## 2011-07-10 LAB — OCCULT BLOOD X 1 CARD TO LAB, STOOL: Fecal Occult Bld: NEGATIVE

## 2011-07-10 NOTE — Telephone Encounter (Signed)
Pt was started on cipro and c/o itching after second dose of antibiotic. Pharmacist advised her to stop med and contact PCP. Patient stopped med yesterday and itching has resolved. She is requesting alternative antibiotic.

## 2011-07-10 NOTE — Telephone Encounter (Signed)
Septra DS bid x 7 days

## 2011-07-10 NOTE — Telephone Encounter (Signed)
Spoke w/pt - she is c/o sweats, heart racing and itching. Original message, pt noted that itching was better and did NOT mention any other problems. I offered apt here now. Pt is by herself and does not feel comfortable driving. She will go to the UC when her husband returns OR call 911 if symptoms become worse. No CP or SOB, she did not take her Adderall today. She will call back tomorrow with update.

## 2011-07-11 MED ORDER — SULFAMETHOXAZOLE-TRIMETHOPRIM 800-160 MG PO TABS: 1.0000 | ORAL_TABLET | Freq: Two times a day (BID) | ORAL | Status: AC

## 2011-07-11 NOTE — Telephone Encounter (Signed)
Please call and check on patient if she has not already called office with update.

## 2011-07-11 NOTE — Telephone Encounter (Signed)
Called pt- she states she drank a lot of water and all symptoms have resolved today. New rx for septra sent to pharmacy. Pt is aware.

## 2011-07-20 DIAGNOSIS — Z8739 Personal history of other diseases of the musculoskeletal system and connective tissue: Secondary | ICD-10-CM

## 2011-07-24 ENCOUNTER — Other Ambulatory Visit: Payer: Self-pay | Admitting: Internal Medicine

## 2011-07-24 ENCOUNTER — Encounter: Payer: Self-pay | Admitting: Internal Medicine

## 2011-07-24 ENCOUNTER — Other Ambulatory Visit (INDEPENDENT_AMBULATORY_CARE_PROVIDER_SITE_OTHER): Payer: BC Managed Care – PPO

## 2011-07-24 ENCOUNTER — Ambulatory Visit (INDEPENDENT_AMBULATORY_CARE_PROVIDER_SITE_OTHER): Payer: BC Managed Care – PPO | Admitting: Internal Medicine

## 2011-07-24 VITALS — BP 118/80 | HR 81 | Temp 97.1°F | Wt 147.0 lb

## 2011-07-24 DIAGNOSIS — R059 Cough, unspecified: Secondary | ICD-10-CM

## 2011-07-24 DIAGNOSIS — Z0000 Encounter for general adult medical examination without abnormal findings: Secondary | ICD-10-CM

## 2011-07-24 DIAGNOSIS — F988 Other specified behavioral and emotional disorders with onset usually occurring in childhood and adolescence: Secondary | ICD-10-CM

## 2011-07-24 DIAGNOSIS — F341 Dysthymic disorder: Secondary | ICD-10-CM

## 2011-07-24 DIAGNOSIS — G894 Chronic pain syndrome: Secondary | ICD-10-CM

## 2011-07-24 DIAGNOSIS — R05 Cough: Secondary | ICD-10-CM

## 2011-07-24 DIAGNOSIS — Z23 Encounter for immunization: Secondary | ICD-10-CM

## 2011-07-24 LAB — CBC WITH DIFFERENTIAL/PLATELET
Basophils Absolute: 0 10*3/uL (ref 0.0–0.1)
Basophils Relative: 0.3 % (ref 0.0–3.0)
Eosinophils Absolute: 0 10*3/uL (ref 0.0–0.7)
Lymphocytes Relative: 23.2 % (ref 12.0–46.0)
MCHC: 34.5 g/dL (ref 30.0–36.0)
MCV: 95.4 fl (ref 78.0–100.0)
Monocytes Absolute: 0.5 10*3/uL (ref 0.1–1.0)
Neutrophils Relative %: 68.1 % (ref 43.0–77.0)
RBC: 4.61 Mil/uL (ref 3.87–5.11)
RDW: 13.1 % (ref 11.5–14.6)

## 2011-07-24 LAB — COMPREHENSIVE METABOLIC PANEL
Albumin: 4 g/dL (ref 3.5–5.2)
BUN: 8 mg/dL (ref 6–23)
CO2: 27 mEq/L (ref 19–32)
GFR: 57.72 mL/min — ABNORMAL LOW (ref >60.00)
Glucose, Bld: 111 mg/dL — ABNORMAL HIGH (ref 70–99)
Potassium: 4 mEq/L (ref 3.5–5.1)
Sodium: 138 mEq/L (ref 135–145)
Total Bilirubin: 0.1 mg/dL — ABNORMAL LOW (ref 0.3–1.2)
Total Protein: 7.2 g/dL (ref 6.0–8.3)

## 2011-07-24 LAB — TSH: TSH: 1.33 u[IU]/mL (ref 0.35–5.50)

## 2011-07-24 LAB — LIPID PANEL
HDL: 47.8 mg/dL (ref >39.00)
Triglycerides: 390 mg/dL — ABNORMAL HIGH (ref 0.0–149.0)

## 2011-07-24 LAB — LDL CHOLESTEROL, DIRECT: Direct LDL: 216.6 mg/dL

## 2011-07-24 LAB — HEPATIC FUNCTION PANEL
Albumin: 4 g/dL (ref 3.5–5.2)
Total Bilirubin: 0.1 mg/dL — ABNORMAL LOW (ref 0.3–1.2)

## 2011-07-24 MED ORDER — BENZONATATE 100 MG PO CAPS: 100.0000 mg | ORAL_CAPSULE | Freq: Three times a day (TID) | ORAL | Status: AC | PRN

## 2011-07-24 MED ORDER — AMPHETAMINE-DEXTROAMPHETAMINE 20 MG PO TABS: 20.0000 mg | ORAL_TABLET | Freq: Every day | ORAL | Status: DC

## 2011-07-24 MED ORDER — ALPRAZOLAM 1 MG PO TABS: 1.0000 mg | ORAL_TABLET | Freq: Three times a day (TID) | ORAL | Status: DC

## 2011-07-24 MED ORDER — OMEPRAZOLE MAGNESIUM 20 MG PO TBEC: 20.0000 mg | DELAYED_RELEASE_TABLET | Freq: Two times a day (BID) | ORAL | Status: DC
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
## 2011-07-24 NOTE — Patient Instructions (Signed)
Cough - post infectious cough with no sign of active infection. Plan - take robitussin Dm or the generic equivalent; take rtessalon perles three times a day for 10 days.  Anxiety - refilled xanax  ADHD - have change Rx as requested to adderall 20 mg once in AM. If this works let me know for a 3 months set of Rx.

## 2011-07-25 DIAGNOSIS — Z0000 Encounter for general adult medical examination without abnormal findings: Secondary | ICD-10-CM | POA: Insufficient documentation

## 2011-07-25 NOTE — Assessment & Plan Note (Signed)
change in medication to immediate release adderall 20 mg. 30 days supply - if this works better for her will mail routine prescriptions

## 2011-07-25 NOTE — Assessment & Plan Note (Signed)
She is encouraged to see her gynecologist if she hasn't already done so. She is encouraged to have a routine mammogram. Routine labs are ordered for today.

## 2011-07-25 NOTE — Assessment & Plan Note (Signed)
Medications list updated. She is on a simpler regimen under the care of pain clinic. Implantable stimulator-back is helping. 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

## 2011-07-25 NOTE — Assessment & Plan Note (Signed)
Presently taking 300 mg daily of venlafaxine. She asked about increasing dose but she is on maximum dose now.   Plan- no change in medications.

## 2011-07-26 ENCOUNTER — Encounter: Payer: Self-pay | Admitting: Internal Medicine

## 2011-08-02 ENCOUNTER — Other Ambulatory Visit: Payer: Self-pay | Admitting: *Deleted

## 2011-08-02 ENCOUNTER — Ambulatory Visit: Payer: BC Managed Care – PPO

## 2011-08-02 MED ORDER — OMEPRAZOLE MAGNESIUM 20 MG PO TBEC: 20.0000 mg | DELAYED_RELEASE_TABLET | Freq: Two times a day (BID) | ORAL | Status: DC

## 2011-08-03 ENCOUNTER — Telehealth: Payer: Self-pay | Admitting: *Deleted

## 2011-08-03 NOTE — Telephone Encounter (Signed)
Pt called requesting status of Adderall prior auth. Please advise.

## 2011-08-03 NOTE — Telephone Encounter (Signed)
Waiting on MD to sign form

## 2011-08-03 NOTE — Telephone Encounter (Signed)
Fax over PA form to [facility] for amphetamine salt combo faxed completed form to [phone]

## 2011-09-06 ENCOUNTER — Telehealth: Payer: Self-pay | Admitting: *Deleted

## 2011-09-06 MED ORDER — AMPHETAMINE-DEXTROAMPHETAMINE 20 MG PO TABS: 20.0000 mg | ORAL_TABLET | Freq: Every day | ORAL | Status: DC

## 2011-09-06 NOTE — Telephone Encounter (Signed)
Ok for refill. May issue 3 Rx's

## 2011-09-06 NOTE — Telephone Encounter (Signed)
Request for Adderall 20 mg Rx [last written 10.29.12 #30x0]

## 2011-09-06 NOTE — Telephone Encounter (Signed)
Printed Rx[s]; awaiting MD signature.

## 2011-09-06 NOTE — Telephone Encounter (Signed)
Patient informed Rx ready for p/u 8am-5pm Mon-Fri

## 2011-09-11 ENCOUNTER — Encounter: Payer: Self-pay | Admitting: Internal Medicine

## 2011-12-25 ENCOUNTER — Other Ambulatory Visit: Payer: Self-pay | Admitting: Internal Medicine

## 2011-12-25 NOTE — Telephone Encounter (Signed)
Effexor request [last refill 10.29.12 #60x4]

## 2011-12-29 ENCOUNTER — Telehealth: Payer: Self-pay

## 2011-12-29 NOTE — Telephone Encounter (Signed)
Rx refill request received for Xanax 1 mg, last filled 11/03/2011

## 2011-12-31 NOTE — Telephone Encounter (Signed)
Ok for refill x 5 

## 2012-01-01 MED ORDER — ALPRAZOLAM 1 MG PO TABS: 1.0000 mg | ORAL_TABLET | Freq: Three times a day (TID) | ORAL | Status: DC

## 2012-01-01 NOTE — Telephone Encounter (Signed)
Done

## 2012-01-24 ENCOUNTER — Ambulatory Visit: Payer: BC Managed Care – PPO | Admitting: Internal Medicine

## 2012-02-15 ENCOUNTER — Telehealth: Payer: Self-pay | Admitting: Internal Medicine

## 2012-02-15 NOTE — Telephone Encounter (Signed)
Pt is requesting to be hospitalized--saying she is having mental problems--severe depression--pt ph# [phone]---Pt desire a phone call----

## 2012-02-15 NOTE — Telephone Encounter (Signed)
Returned patient phone call. Explained she need to go to E.R. requesting to be admitted. Stated she was told if she did not go to a facility the police were going to arrest her. Stated they will never find her. Stated she had an appointment on Tuesday with Dr. Debby Bud and will wait and talk with him. Again I stressed for her to go to E.R. To avoid anymore problems before Tuesday.[name] [name]

## 2012-02-16 NOTE — Telephone Encounter (Signed)
Noted  

## 2012-02-20 ENCOUNTER — Encounter: Payer: Self-pay | Admitting: Internal Medicine

## 2012-02-20 ENCOUNTER — Ambulatory Visit (INDEPENDENT_AMBULATORY_CARE_PROVIDER_SITE_OTHER): Payer: BC Managed Care – PPO | Admitting: Internal Medicine

## 2012-02-20 VITALS — BP 106/70 | HR 90 | Temp 97.4°F | Wt 147.0 lb

## 2012-02-20 DIAGNOSIS — F341 Dysthymic disorder: Secondary | ICD-10-CM

## 2012-02-20 DIAGNOSIS — G4700 Insomnia, unspecified: Secondary | ICD-10-CM

## 2012-02-20 DIAGNOSIS — Z23 Encounter for immunization: Secondary | ICD-10-CM

## 2012-02-20 DIAGNOSIS — F988 Other specified behavioral and emotional disorders with onset usually occurring in childhood and adolescence: Secondary | ICD-10-CM

## 2012-02-20 DIAGNOSIS — G894 Chronic pain syndrome: Secondary | ICD-10-CM

## 2012-02-20 DIAGNOSIS — Z2911 Encounter for prophylactic immunotherapy for respiratory syncytial virus (RSV): Secondary | ICD-10-CM

## 2012-02-20 NOTE — Patient Instructions (Addendum)
1. Small area of the thigh - no worrisome lesion. No further evaluation needed.  2. Psychiatric - definitely need to see a psychiatrist for a diagnostic evaluation and medication management. Also, need to have therapy to help understand what issues are in your power to change and adapt. You can let me know the name of a person for you to see we will put in the request for insurance coverage.  3. Disability - go ahead and get started with the paperwork. We will provide records as requested. Know that you will most likely be turned down the first time.   4. Prolonged cough - most likely an allergy related problem. Plan - try over the counter medication, i.e. Claritin or Allegra (both come as generics).  5. Sleep - Sleep is a learned or unlearned behavior. 5 principles of sleep hygiene - 1) regular hour to retire and rise 7days/wk 2) no stimulants - caffeine, chocolat, alcohol, 3) regular exercise  - every afternoon  4) sleep sanctuary - a space that is right light, temperature, sound level, good bed where all you do is sleep. 5) No extinction behaviors, e.g. Laying in bed awake doing anything but sleeping. This means if you have a bad night - no naps, etc

## 2012-02-21 DIAGNOSIS — G4700 Insomnia, unspecified: Secondary | ICD-10-CM | POA: Insufficient documentation

## 2012-02-21 NOTE — Assessment & Plan Note (Signed)
Revisited the importance of good sleep hygiene. No new medications prescribed. She may use otc benadryl and may continue with qHS xanax.

## 2012-02-21 NOTE — Assessment & Plan Note (Signed)
Patient continues to follow with pain specialist and is currently on fentanyl patch q 48 hrs. Her pain is stable and fairly well controlled.
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
## 2012-02-21 NOTE — Assessment & Plan Note (Signed)
Mayor depression with anxiety. Discussed  My recommendation for psychiatric assessment and med  Management. Med table is at the limit of my comfort in prescribing.  Plan-  She will check for psychiatric references in [facility], decide on preference of treatment there or in [place] and get back to me for referral  Counseling is clearly indicated: she wants someone who uses hypnosis as a therapeutic tool. She will check her home county for potential therapist or get back to me for local [place] referral.  The importance of getting the above done was stressed to her in her husband's presence. She does not appear to be a danger to herself at this encounter.  (greater than 50% of a 50 minute visit spent on education and counseling)

## 2012-02-21 NOTE — Assessment & Plan Note (Signed)
Patient has been diagnosed by pscyh care professional with ADD. She had been on stimulants in the past. At her last visit in Oct '12 she was prescribed Adderall 20 mg daily but she could not get insurance coverage so she stopped taking any medication. Today she is not overly distractable.  Plan - see depression below.

## 2012-03-06 ENCOUNTER — Other Ambulatory Visit: Payer: Self-pay | Admitting: Internal Medicine

## 2012-04-16 ENCOUNTER — Other Ambulatory Visit: Payer: Self-pay

## 2012-04-16 MED ORDER — OMEPRAZOLE MAGNESIUM 20 MG PO TBEC: 20.0000 mg | DELAYED_RELEASE_TABLET | Freq: Two times a day (BID) | ORAL | Status: DC

## 2012-04-21 ENCOUNTER — Other Ambulatory Visit: Payer: Self-pay | Admitting: Internal Medicine

## 2012-05-08 ENCOUNTER — Other Ambulatory Visit: Payer: Self-pay | Admitting: *Deleted

## 2012-05-08 NOTE — Telephone Encounter (Signed)
PA APPROVED FOR OMEPRAZOLE TILL 05/10/2013

## 2012-08-06 ENCOUNTER — Telehealth: Payer: Self-pay | Admitting: Internal Medicine

## 2012-08-06 NOTE — Telephone Encounter (Signed)
ok 

## 2012-08-06 NOTE — Telephone Encounter (Signed)
[facility] she wants to have all her medications at the same pharmacy

## 2012-08-06 NOTE — Telephone Encounter (Signed)
Please advise in quantity and number of refills

## 2012-08-07 MED ORDER — ALPRAZOLAM 1 MG PO TABS: ORAL_TABLET | ORAL | Status: DC

## 2012-08-07 NOTE — Telephone Encounter (Signed)
Rx called in, pt advised of same via VM

## 2012-08-07 NOTE — Telephone Encounter (Signed)
Per her current med list: alprazolam 1 mg 3-5 times daily, #150, refill x 3

## 2012-09-05 ENCOUNTER — Other Ambulatory Visit: Payer: Self-pay | Admitting: *Deleted

## 2012-09-05 MED ORDER — OMEPRAZOLE MAGNESIUM 20 MG PO TBEC: 20.0000 mg | DELAYED_RELEASE_TABLET | Freq: Two times a day (BID) | ORAL | Status: DC

## 2012-10-07 ENCOUNTER — Ambulatory Visit: Payer: BC Managed Care – PPO | Admitting: Internal Medicine

## 2012-10-14 ENCOUNTER — Ambulatory Visit (INDEPENDENT_AMBULATORY_CARE_PROVIDER_SITE_OTHER)
Admission: RE | Admit: 2012-10-14 | Discharge: 2012-10-14 | Disposition: A | Discharge status: Home / Self Care | Payer: BC Managed Care – PPO | Source: Ambulatory Visit | Attending: Internal Medicine | Admitting: Internal Medicine

## 2012-10-14 ENCOUNTER — Other Ambulatory Visit (INDEPENDENT_AMBULATORY_CARE_PROVIDER_SITE_OTHER): Payer: BC Managed Care – PPO

## 2012-10-14 ENCOUNTER — Ambulatory Visit (INDEPENDENT_AMBULATORY_CARE_PROVIDER_SITE_OTHER): Payer: BC Managed Care – PPO | Admitting: Internal Medicine

## 2012-10-14 ENCOUNTER — Encounter: Payer: Self-pay | Admitting: Internal Medicine

## 2012-10-14 VITALS — BP 130/80 | HR 120 | Temp 96.6°F | Resp 10 | Wt 147.6 lb

## 2012-10-14 DIAGNOSIS — R05 Cough: Secondary | ICD-10-CM

## 2012-10-14 DIAGNOSIS — R198 Other specified symptoms and signs involving the digestive system and abdomen: Secondary | ICD-10-CM

## 2012-10-14 DIAGNOSIS — K589 Irritable bowel syndrome without diarrhea: Secondary | ICD-10-CM

## 2012-10-14 DIAGNOSIS — R059 Cough, unspecified: Secondary | ICD-10-CM

## 2012-10-14 DIAGNOSIS — R194 Change in bowel habit: Secondary | ICD-10-CM

## 2012-10-14 DIAGNOSIS — G4700 Insomnia, unspecified: Secondary | ICD-10-CM

## 2012-10-14 DIAGNOSIS — R5381 Other malaise: Secondary | ICD-10-CM

## 2012-10-14 DIAGNOSIS — R5383 Other fatigue: Secondary | ICD-10-CM

## 2012-10-14 LAB — CBC WITH DIFFERENTIAL/PLATELET
Basophils Relative: 0.2 % (ref 0.0–3.0)
Eosinophils Absolute: 0 10*3/uL (ref 0.0–0.7)
Eosinophils Relative: 0.4 % (ref 0.0–5.0)
HCT: 44.8 % (ref 36.0–46.0)
Hemoglobin: 15.4 g/dL — ABNORMAL HIGH (ref 12.0–15.0)
Lymphs Abs: 1.6 10*3/uL (ref 0.7–4.0)
MCHC: 34.3 g/dL (ref 30.0–36.0)
MCV: 90.7 fl (ref 78.0–100.0)
Monocytes Absolute: 0.5 10*3/uL (ref 0.1–1.0)
Neutro Abs: 3.8 10*3/uL (ref 1.4–7.7)
Neutrophils Relative %: 64.4 % (ref 43.0–77.0)
RBC: 4.94 Mil/uL (ref 3.87–5.11)
WBC: 5.9 10*3/uL (ref 4.5–10.5)

## 2012-10-14 LAB — SEDIMENTATION RATE: Sed Rate: 14 mm/hr (ref 0–22)
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
## 2012-10-14 NOTE — Patient Instructions (Addendum)
General illness - exam is not revealing of any abnormality. Plan Lab: blood count, general chemistry, celiac panel (leaky gut)  Imaging - chest xray to evaluate for bronchitis  IBS - chronic constipation type.  Plan Hold the Benty (dicyclomine) and take Linzess 290 mg 30 min before first meal of the day  Sleep - take this up with your pain medicine specialist - want to avoid drug interactions.   Severe raking cough - concern for chronic bronchitis vs reflux related cough Plan - trial of dexilant once a day (samples)

## 2012-10-15 LAB — COMPREHENSIVE METABOLIC PANEL
AST: 39 U/L — ABNORMAL HIGH (ref 0–37)
Albumin: 3.9 g/dL (ref 3.5–5.2)
Alkaline Phosphatase: 130 U/L — ABNORMAL HIGH (ref 39–117)
BUN: 8 mg/dL (ref 6–23)
Creatinine, Ser: 0.9 mg/dL (ref 0.4–1.2)
Glucose, Bld: 103 mg/dL — ABNORMAL HIGH (ref 70–99)
Potassium: 4.4 mEq/L (ref 3.5–5.1)

## 2012-10-15 LAB — GLIADIN ANTIBODIES, SERUM
Gliadin IgA: 3.1 U/mL (ref <20)
Gliadin IgG: 4.4 U/mL (ref <20)

## 2012-10-15 LAB — RETICULIN ANTIBODIES, IGA W TITER: Reticulin Ab, IgA: NEGATIVE

## 2012-10-15 NOTE — Assessment & Plan Note (Signed)
IBS - chronic constipation type.  Plan Hold the Benty (dicyclomine) and take Linzess 290 mg 30 min before first meal of the day

## 2012-10-15 NOTE — Assessment & Plan Note (Signed)
Sleep - take this up with your pain medicine specialist - want to avoid drug interactions.

## 2012-10-18 ENCOUNTER — Telehealth: Payer: Self-pay | Admitting: Internal Medicine

## 2012-10-18 NOTE — Telephone Encounter (Signed)
Pt requesting lab results. Please advise.

## 2012-10-18 NOTE — Telephone Encounter (Signed)
Labs were normal including celiac panel. CXR normal. Will push result through MyChart

## 2012-10-18 NOTE — Telephone Encounter (Signed)
Patient is requesting a call back with her lab results from her appointment on Monday

## 2012-10-20 ENCOUNTER — Encounter: Payer: Self-pay | Admitting: Internal Medicine

## 2012-11-09 ENCOUNTER — Other Ambulatory Visit: Payer: Self-pay

## 2012-11-14 ENCOUNTER — Telehealth: Payer: Self-pay | Admitting: Internal Medicine

## 2012-11-14 NOTE — Telephone Encounter (Signed)
Still sick request something for cough, request call back

## 2012-11-14 NOTE — Telephone Encounter (Signed)
Last seen for cough 10/14/12. For persistent cough she was to take dexilant. Did it help?  Plan:  Prednisone 20 mg daily x 3,m 10 mg dialy x 6 ( 10mg  tabs #12 tabs  Tessalon perles 100 mg tid #30  Phenergan with codein cough syrup 1`80 cc 1 tsp q 6

## 2012-11-14 NOTE — Telephone Encounter (Signed)
Please read note below and advise.  

## 2012-11-18 ENCOUNTER — Telehealth: Payer: Self-pay | Admitting: Internal Medicine

## 2012-11-18 NOTE — Telephone Encounter (Signed)
Ok for linzess 290 mg sig once in AM before first meal, #30, refill x 5

## 2012-11-18 NOTE — Telephone Encounter (Signed)
Forward  1 page from [facility] to Dr. Illene Regulus for review on 11-18-12 ym

## 2012-11-18 NOTE — Telephone Encounter (Signed)
Patient Information:  Caller Name: Anjelika  Phone: [phone]  Patient: [name] [name], [name] [name]  Gender: Female  DOB: [birth year]  Age: 57 Years  PCP: Illene Regulus (Adults only)  Office Follow Up:  Does the office need to follow up with this patient?: Yes  Instructions For The Office: Rx request.  Pt refulsing ED.  RN Note:  Caller delined ED and wants her IBS medicine called into [facility].  Symptoms  Reason For Call & Symptoms: Abdominal pain and wanting medication called in for the samples she received at last vist.  Pt having a headache, stomach,  chest pain that is ongoing from GERD (per caller) , unable to walk w/o holding onto something. Caller states she can't / won't go to ED.  Pt asking for a refill for IBS medications.   Reviewed Health History In EMR: Yes  Reviewed Medications In EMR: Yes  Reviewed Allergies In EMR: Yes  Reviewed Surgeries / Procedures: Yes  Date of Onset of Symptoms: 11/17/2012  Treatments Tried: Phenergan suppositories.  Treatments Tried Worked: No  Guideline(s) Used:  Abdominal Pain - Female  Chest Pain  Headache  Disposition Per Guideline:   Go to ED Now  Reason For Disposition Reached:   Severe abdominal pain (e.g., excruciating)  Advice Given:  Call Back If:  You become worse.  Pt refused recommendation: :  Patient Requests Prescription  Requesting refill for IBS medicine

## 2012-11-19 ENCOUNTER — Other Ambulatory Visit: Payer: Self-pay | Admitting: *Deleted

## 2012-11-19 ENCOUNTER — Telehealth: Payer: Self-pay | Admitting: Internal Medicine

## 2012-11-19 MED ORDER — PROMETHAZINE-CODEINE 6.25-10 MG/5ML PO SYRP: 5.0000 mL | ORAL_SOLUTION | Freq: Four times a day (QID) | ORAL | Status: DC | PRN

## 2012-11-19 MED ORDER — LINACLOTIDE 290 MCG PO CAPS: 1.0000 | ORAL_CAPSULE | Freq: Every morning | ORAL | Status: DC

## 2012-11-19 MED ORDER — PREDNISONE 20 MG PO TABS: ORAL_TABLET | ORAL | Status: DC

## 2012-11-19 MED ORDER — BENZONATATE 100 MG PO CAPS: 100.0000 mg | ORAL_CAPSULE | Freq: Two times a day (BID) | ORAL | Status: DC | PRN

## 2012-11-19 NOTE — Telephone Encounter (Signed)
Left message on machine for pt to return my call  

## 2012-11-19 NOTE — Telephone Encounter (Signed)
Pt is requesting phenergan suppositories instead of the liquid in cough syrup.  She used 2 out of date ones last night.  She is out.

## 2012-11-19 NOTE — Telephone Encounter (Signed)
Received 1 page from [facility], sent to Dr. Debby Bud. 11/19/12/sd

## 2012-11-20 MED ORDER — PROMETHAZINE HCL 25 MG RE SUPP: 25.0000 mg | Freq: Four times a day (QID) | RECTAL | Status: DC | PRN

## 2012-11-20 NOTE — Telephone Encounter (Signed)
Pt is aware.  

## 2012-11-20 NOTE — Telephone Encounter (Signed)
New Rx sent to pharmacy

## 2012-12-04 ENCOUNTER — Other Ambulatory Visit: Payer: Self-pay

## 2012-12-04 MED ORDER — VENLAFAXINE HCL ER 150 MG PO CP24: ORAL_CAPSULE | ORAL | Status: DC

## 2013-01-15 ENCOUNTER — Other Ambulatory Visit: Payer: Self-pay

## 2013-01-15 MED ORDER — ALPRAZOLAM 1 MG PO TABS: ORAL_TABLET | ORAL | Status: DC

## 2013-02-11 ENCOUNTER — Telehealth: Payer: Self-pay

## 2013-02-11 MED ORDER — ALPRAZOLAM 1 MG PO TABS: ORAL_TABLET | ORAL | Status: DC

## 2013-02-11 NOTE — Telephone Encounter (Signed)
Received faxed refill request from [facility] for alprazolam 1 mg tabs (take 1 five times daily prn). Please advise if ok to refill Thanks

## 2013-02-11 NOTE — Telephone Encounter (Signed)
OK #100 no ref Needs OV w/Dr Norins Thx

## 2013-02-11 NOTE — Telephone Encounter (Signed)
RX called in to pharmacy and verbal given

## 2013-03-06 ENCOUNTER — Other Ambulatory Visit: Payer: Self-pay

## 2013-03-06 MED ORDER — ALPRAZOLAM 1 MG PO TABS: ORAL_TABLET | ORAL | Status: DC

## 2013-03-07 NOTE — Telephone Encounter (Signed)
Alprazolam called to pharmacy  

## 2013-03-21 ENCOUNTER — Telehealth: Payer: Self-pay

## 2013-03-21 DIAGNOSIS — R05 Cough: Secondary | ICD-10-CM

## 2013-03-21 DIAGNOSIS — R053 Chronic cough: Secondary | ICD-10-CM

## 2013-03-21 NOTE — Telephone Encounter (Signed)
Phone call from patient requesting a referral to pulmonologist.

## 2013-03-21 NOTE — Telephone Encounter (Signed)
Last ov Jan '14 at which time she had a cough, normal CXR. Was given a trial of dexilant - no follow up since! Need reason for referral - is it chronic cough? Or another problem. Did she see an improvement with dexilant?

## 2013-03-24 NOTE — Telephone Encounter (Signed)
Left message for patient to call back  

## 2013-03-26 NOTE — Telephone Encounter (Signed)
Ok then. Referral requested

## 2013-03-26 NOTE — Telephone Encounter (Signed)
Phone call back to patient. She states it is because of the cough and getting winded. She did not see any improvement on the Dexilant. Please advise. Thanks

## 2013-03-27 NOTE — Telephone Encounter (Signed)
Patient notified

## 2013-04-01 ENCOUNTER — Institutional Professional Consult (permissible substitution): Payer: BC Managed Care – PPO | Admitting: Internal Medicine

## 2013-04-09 ENCOUNTER — Other Ambulatory Visit: Payer: Self-pay

## 2013-04-09 MED ORDER — OMEPRAZOLE MAGNESIUM 20 MG PO TBEC: 20.0000 mg | DELAYED_RELEASE_TABLET | Freq: Two times a day (BID) | ORAL | Status: DC

## 2013-04-09 MED ORDER — OMEPRAZOLE 20 MG PO CPDR: 20.0000 mg | DELAYED_RELEASE_CAPSULE | Freq: Two times a day (BID) | ORAL | Status: DC

## 2013-04-09 MED ORDER — ALPRAZOLAM 1 MG PO TABS: ORAL_TABLET | ORAL | Status: DC

## 2013-04-09 NOTE — Telephone Encounter (Signed)
Pharmacy faxed request for this to be refilled

## 2013-04-10 NOTE — Telephone Encounter (Signed)
Alprazolam called to pharmacy  

## 2013-04-22 ENCOUNTER — Other Ambulatory Visit: Payer: Self-pay

## 2013-04-22 MED ORDER — VENLAFAXINE HCL ER 150 MG PO CP24: ORAL_CAPSULE | ORAL | Status: DC

## 2013-05-07 ENCOUNTER — Other Ambulatory Visit: Payer: Self-pay

## 2013-05-07 NOTE — Telephone Encounter (Signed)
Very high dosing! Is she seeing a therapist or psychiatrist. Need more information before authorizing this much xanax.

## 2013-05-07 NOTE — Telephone Encounter (Signed)
Patient called [facility] requesting an early refill and increase in quantity of xanax. Per patient #100 didn't last her.

## 2013-05-14 ENCOUNTER — Ambulatory Visit: Payer: BC Managed Care – PPO | Admitting: Internal Medicine

## 2013-05-15 ENCOUNTER — Encounter: Payer: Self-pay | Admitting: Internal Medicine

## 2013-05-15 ENCOUNTER — Ambulatory Visit (INDEPENDENT_AMBULATORY_CARE_PROVIDER_SITE_OTHER): Payer: BC Managed Care – PPO | Admitting: Internal Medicine

## 2013-05-15 VITALS — BP 112/80 | HR 79 | Temp 97.4°F | Wt 158.0 lb

## 2013-05-15 DIAGNOSIS — F341 Dysthymic disorder: Secondary | ICD-10-CM

## 2013-05-15 DIAGNOSIS — F988 Other specified behavioral and emotional disorders with onset usually occurring in childhood and adolescence: Secondary | ICD-10-CM

## 2013-05-15 DIAGNOSIS — N952 Postmenopausal atrophic vaginitis: Secondary | ICD-10-CM

## 2013-05-15 DIAGNOSIS — G894 Chronic pain syndrome: Secondary | ICD-10-CM

## 2013-05-15 MED ORDER — ALPRAZOLAM 1 MG PO TABS: ORAL_TABLET | ORAL | Status: DC

## 2013-05-15 NOTE — Patient Instructions (Addendum)
1. Depression and anxiety - worsening depression. Plan Continue the effexor XR 150 mg daily  Xanax 1 mg three times daily for depression and anxiety  Referral is put in to Dr. Evelene Croon - psychiatrist.  2. Chronic pain - per the pain clinic and this would include any muscle relaxants  3. IBS - a chronic problem Plan Improve your diet: see attached  Continue Bentyl as needed.  Diet and Irritable Bowel Syndrome  No cure has been found for irritable bowel syndrome (IBS). Many options are available to treat the symptoms. Your caregiver will give you the best treatments available for your symptoms. He or she will also encourage you to manage stress and to make changes to your diet. You need to work with your caregiver and Registered Dietician to find the best combination of medicine, diet, counseling, and support to control your symptoms. The following are some diet suggestions. FOODS THAT MAKE IBS WORSE  Fatty foods, such as [place] fries.  Milk products, such as cheese or ice cream.  Chocolate.  Alcohol.  Caffeine (found in coffee and some sodas).  Carbonated drinks, such as soda. If certain foods cause symptoms, you should eat less of them or stop eating them. FOOD JOURNAL   Keep a journal of the foods that seem to cause distress. Write down:  What you are eating during the day and when.  What problems you are having after eating.  When the symptoms occur in relation to your meals.  What foods always make you feel badly.  Take your notes with you to your caregiver to see if you should stop eating certain foods. FOODS THAT MAKE IBS BETTER Fiber reduces IBS symptoms, especially constipation, because it makes stools soft, bulky, and easier to pass. Fiber is found in bran, bread, cereal, beans, fruit, and vegetables. Examples of foods with fiber include:  Apples.  Peaches.  Pears.  Berries.  Figs.  Broccoli, raw.  Cabbage.  Carrots.  Raw peas.  Kidney beans.  [place]  beans.  Whole-grain bread.  Whole-grain cereal. Add foods with fiber to your diet a little at a time. This will let your body get used to them. Too much fiber at once might cause gas and swelling of your abdomen. This can trigger symptoms in a person with IBS. Caregivers usually recommend a diet with enough fiber to produce soft, painless bowel movements. High fiber diets may cause gas and bloating. However, these symptoms often go away within a few weeks, as your body adjusts. In many cases, dietary fiber may lessen IBS symptoms, particularly constipation. However, it may not help pain or diarrhea. High fiber diets keep the colon mildly enlarged (distended) with the added fiber. This may help prevent spasms in the colon. Some forms of fiber also keep water in the stool, thereby preventing hard stools that are difficult to pass.  Besides telling you to eat more foods with fiber, your caregiver may also tell you to get more fiber by taking a fiber pill or drinking water mixed with a special high fiber powder. An example of this is a natural fiber laxative containing psyllium seed.  TIPS  Large meals can cause cramping and diarrhea in people with IBS. If this happens to you, try eating 4 or 5 small meals a day, or try eating less at each of your usual 3 meals. It may also help if your meals are low in fat and high in carbohydrates. Examples of carbohydrates are pasta, rice, whole-grain breads and cereals,  fruits, and vegetables.  If dairy products cause your symptoms to flare up, you can try eating less of those foods. You might be able to handle yogurt better than other dairy products, because it contains bacteria that helps with digestion. Dairy products are an important source of calcium and other nutrients. If you need to avoid dairy products, be sure to talk with a Registered Dietitian about getting these nutrients through other food sources.  Drink enough water and fluids to keep your urine clear  or pale yellow. This is important, especially if you have diarrhea. FOR MORE INFORMATION  International Foundation for Functional Gastrointestinal Disorders: www.iffgd.org  National Digestive Diseases Information Clearinghouse: digestive.StageSync.si Document Released: 12/02/2003 Document Revised: 12/04/2011 Document Reviewed: 08/19/2007 [facility] Patient Information 2014 [place], [place].   Irritable Bowel Syndrome Irritable Bowel Syndrome (IBS) is caused by a disturbance of normal bowel function. Other terms used are spastic colon, mucous colitis, and irritable colon. It does not require surgery, nor does it lead to cancer. There is no cure for IBS. But with proper diet, stress reduction, and medication, you will find that your problems (symptoms) will gradually disappear or improve. IBS is a common digestive disorder. It usually appears in late adolescence or early adulthood. Women develop it twice as often as men. CAUSES  After food has been digested and absorbed in the small intestine, waste material is moved into the colon (large intestine). In the colon, water and salts are absorbed from the undigested products coming from the small intestine. The remaining residue, or fecal material, is held for elimination. Under normal circumstances, gentle, rhythmic contractions on the bowel walls push the fecal material along the colon towards the rectum. In IBS, however, these contractions are irregular and poorly coordinated. The fecal material is either retained too long, resulting in constipation, or expelled too soon, producing diarrhea. SYMPTOMS  The most common symptom of IBS is pain. It is typically in the lower left side of the belly (abdomen). But it may occur anywhere in the abdomen. It can be felt as heartburn, backache, or even as a dull pain in the arms or shoulders. The pain comes from excessive bowel-muscle spasms and from the buildup of gas and fecal material in the colon. This pain:  Can  range from sharp belly (abdominal) cramps to a dull, continuous ache.  Usually worsens soon after eating.  Is typically relieved by having a bowel movement or passing gas. Abdominal pain is usually accompanied by constipation. But it may also produce diarrhea. The diarrhea typically occurs right after a meal or upon arising in the morning. The stools are typically soft and watery. They are often flecked with secretions (mucus). Other symptoms of IBS include:  Bloating.  Loss of appetite.  Heartburn.  Feeling sick to your stomach (nausea).  Belching  Vomiting  Gas. IBS may also cause a number of symptoms that are unrelated to the digestive system:  Fatigue.  Headaches.  Anxiety  Shortness of breath  Difficulty in concentrating.  Dizziness. These symptoms tend to come and go. DIAGNOSIS  The symptoms of IBS closely mimic the symptoms of other, more serious digestive disorders. So your caregiver may wish to perform a variety of additional tests to exclude these disorders. He/she wants to be certain of learning what is wrong (diagnosis). The nature and purpose of each test will be explained to you. TREATMENT A number of medications are available to help correct bowel function and/or relieve bowel spasms and abdominal pain. Among the drugs available  are:  Mild, non-irritating laxatives for severe constipation and to help restore normal bowel habits.  Specific anti-diarrheal medications to treat severe or prolonged diarrhea.  Anti-spasmodic agents to relieve intestinal cramps.  Your caregiver may also decide to treat you with a mild tranquilizer or sedative during unusually stressful periods in your life. The important thing to remember is that if any drug is prescribed for you, make sure that you take it exactly as directed. Make sure that your caregiver knows how well it worked for you. HOME CARE INSTRUCTIONS   Avoid foods that are high in fat or oils. Some examples  [id number] cream, butter, frankfurters, sausage, and other fatty meats.  Avoid foods that have a laxative effect, such as fruit, fruit juice, and dairy products.  Cut out carbonated drinks, chewing gum, and "gassy" foods, such as beans and cabbage. This may help relieve bloating and belching.  Bran taken with plenty of liquids may help relieve constipation.  Keep track of what foods seem to trigger your symptoms.  Avoid emotionally charged situations or circumstances that produce anxiety.  Start or continue exercising.  Get plenty of rest and sleep. MAKE SURE YOU:   Understand these instructions.  Will watch your condition.  Will get help right away if you are not doing well or get worse. Document Released: 09/11/2005 Document Revised: 12/04/2011 Document Reviewed: 05/01/2008 [facility] Patient Information 2014 [place], [place].
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
## 2013-05-18 NOTE — Assessment & Plan Note (Signed)
She continues to be seen in pain clinic in [place]. Her chronic pain seems adequately controlled.

## 2013-05-18 NOTE — Assessment & Plan Note (Signed)
Currently not using any medication. She is interested in resuming oral estrogen replacement.  Plan Will need to sort out other problems and medications before considering oral estrogen replacement

## 2013-05-18 NOTE — Assessment & Plan Note (Signed)
Profoundly depressed. Has suicidal ideation but states she has not taken any actions in this regard and will not.  Plan Continue effexor XR 150  Reduce Alprazolam to 1 mg tid  Referral placed to Dr. Evelene Croon.

## 2013-05-18 NOTE — Assessment & Plan Note (Signed)
She has stopped medication for this. She feels her depression is more responsible for her inability to focus.

## 2013-05-21 ENCOUNTER — Telehealth: Payer: Self-pay

## 2013-05-21 NOTE — Telephone Encounter (Signed)
Phone call from patient requesting an Estradiol prescription you once prescribed to her. She also wants you to know that she has seen Dr Alveria Apley. Please advise on the prescription.

## 2013-05-21 NOTE — Telephone Encounter (Signed)
Cannot locate on medication record. Was the estradiol a vaginal product or oral medication?

## 2013-05-22 MED ORDER — ESTRADIOL 0.1 MG/GM VA CREA: 2.0000 g | TOPICAL_CREAM | Freq: Every day | VAGINAL | Status: DC

## 2013-05-22 NOTE — Telephone Encounter (Signed)
Patient states it was a vaginal product. She also states the medication Dr Alveria Apley put her on to help her sleep (Trazadone 50 mg) is not helping at all. She wants to know if the Xanax you prescribe can be increased to try and help her sleep. She states she is having a hard time, her husband just let her know he wants a divorce. Please advise. Thanks.

## 2013-05-22 NOTE — Telephone Encounter (Signed)
Ok for estradiol vag to use daily x 3 wks then 3/wk.  Was trazodone prescribed by Dr. Mayo Ao? No she may not increase her xanax. She needs to check with Dr., Evelene Croon.

## 2013-05-22 NOTE — Telephone Encounter (Signed)
Left message to call back  

## 2013-05-23 NOTE — Telephone Encounter (Signed)
Patient has been advised of Estradiol that has been sent to her pharmacy. The Trazodone was prescribed by Dr Evelene Croon. She has also been advised to check with that doctor regarding the Xanax.

## 2013-05-27 ENCOUNTER — Telehealth: Payer: Self-pay | Admitting: *Deleted

## 2013-05-27 NOTE — Telephone Encounter (Signed)
1 g

## 2013-05-27 NOTE — Telephone Encounter (Signed)
Kelly from [facility] called states they need total grams of Estrace to be applied at each dose. Please advise

## 2013-05-28 NOTE — Telephone Encounter (Signed)
Spoke with Tresa Endo advised of MDs orders

## 2013-06-02 ENCOUNTER — Telehealth: Payer: Self-pay | Admitting: *Deleted

## 2013-06-02 NOTE — Telephone Encounter (Signed)
Kelly from pharmacy states the Estrace vaginal cream is too expensive as written, requests to compound medication which is much cheaper.  Please advise

## 2013-06-02 NOTE — Telephone Encounter (Signed)
Spoke with Tresa Endo advised of MDs message.  No other info needed.

## 2013-06-02 NOTE — Telephone Encounter (Signed)
OK to compound. Is a new Rx needed? What should it say in regard to instructions for the pharmacist?

## 2013-06-05 ENCOUNTER — Ambulatory Visit: Payer: BC Managed Care – PPO | Admitting: Internal Medicine

## 2013-07-09 ENCOUNTER — Telehealth: Payer: Self-pay | Admitting: Internal Medicine
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
## 2013-07-29 ENCOUNTER — Other Ambulatory Visit: Payer: Self-pay

## 2013-07-29 MED ORDER — PROMETHAZINE HCL 25 MG RE SUPP: 25.0000 mg | Freq: Four times a day (QID) | RECTAL | Status: DC | PRN

## 2013-07-31 ENCOUNTER — Other Ambulatory Visit: Payer: Self-pay

## 2013-08-19 ENCOUNTER — Telehealth: Payer: Self-pay | Admitting: *Deleted

## 2013-08-19 MED ORDER — ONDANSETRON 4 MG PO TBDP: 4.0000 mg | ORAL_TABLET | Freq: Three times a day (TID) | ORAL | Status: DC | PRN

## 2013-08-19 NOTE — Telephone Encounter (Signed)
Ok for zofran ODT-Rx sent to pharmacy

## 2013-08-19 NOTE — Telephone Encounter (Signed)
Pt called requesting a stronger medication for nausea.  She is requesting an ODT medication.  Please advise

## 2013-08-19 NOTE — Telephone Encounter (Signed)
Pt's pharmacy call to check up on the request. Please advise

## 2013-08-19 NOTE — Telephone Encounter (Signed)
Rx sent 

## 2013-09-04 ENCOUNTER — Ambulatory Visit (INDEPENDENT_AMBULATORY_CARE_PROVIDER_SITE_OTHER): Payer: BC Managed Care – PPO

## 2013-09-04 DIAGNOSIS — Z23 Encounter for immunization: Secondary | ICD-10-CM

## 2013-09-23 ENCOUNTER — Telehealth: Payer: Self-pay | Admitting: Internal Medicine

## 2013-09-23 DIAGNOSIS — Z1211 Encounter for screening for malignant neoplasm of colon: Secondary | ICD-10-CM

## 2013-09-23 DIAGNOSIS — Z1239 Encounter for other screening for malignant neoplasm of breast: Secondary | ICD-10-CM

## 2013-09-23 NOTE — Telephone Encounter (Signed)
Pt would like a referral for a mammogram and a colonoscopy.  She thinks the last mammogram was at [facility] 3 years ago.  It has been around 15 years since the last colonoscopy.

## 2013-09-24 NOTE — Telephone Encounter (Signed)
Orders done

## 2013-09-26 ENCOUNTER — Telehealth: Payer: Self-pay | Admitting: Internal Medicine

## 2013-09-26 MED ORDER — ONDANSETRON 4 MG PO TBDP: 4.0000 mg | ORAL_TABLET | Freq: Three times a day (TID) | ORAL | Status: DC | PRN

## 2013-09-26 NOTE — Telephone Encounter (Signed)
Reordered for #60. No increase in dose - not on chemotherapy

## 2013-09-26 NOTE — Telephone Encounter (Signed)
Patient has been notified and did not present any further questions/concerns.

## 2013-09-26 NOTE — Telephone Encounter (Signed)
Pt called request refill for Ondansetron and pt request for Dr. Linda Hedges to up the dosage on this med. Please advise

## 2013-09-26 NOTE — Telephone Encounter (Signed)
Patient advised that she would need an office visit to discuss

## 2013-09-26 NOTE — Telephone Encounter (Signed)
Per pt she is going through a divorce and having some financial problems. She says she is having a lot of trouble with acid reflux and stomach pain. She would like for Dr. Carlean Purl to look at her chart, and say whether he thinks she may need an EGD. If so she will gladly schedule an OV and come in to talk to him about a double procedure.

## 2013-10-01 ENCOUNTER — Telehealth: Payer: Self-pay | Admitting: Internal Medicine

## 2013-10-01 NOTE — Telephone Encounter (Signed)
Pt request for Ondansetron 4 mg to be resend to [facility] and there phone number is [phone]. Pt request for Dr. Linda Hedges to give her about 20 tablet is it is possible. Please advise.

## 2013-10-01 NOTE — Telephone Encounter (Signed)
Filled Rx for zofran Jan 2nd for #60. Too early for refill

## 2013-10-02 MED ORDER — ONDANSETRON 4 MG PO TBDP: 4.0000 mg | ORAL_TABLET | Freq: Three times a day (TID) | ORAL | Status: DC | PRN

## 2013-10-02 NOTE — Telephone Encounter (Signed)
Phone call to patient and left a message to return call

## 2013-10-02 NOTE — Telephone Encounter (Signed)
Spoke to patient she states she no longer uses [facility]. Her pharmacy has been updated on her chart. [facility] called [facility] the other day to have all meds transferred but they did not have the Ondansetron 4 mg that shows we sent it on Jan 2 so this was sent to [facility].

## 2013-10-24 ENCOUNTER — Ambulatory Visit (INDEPENDENT_AMBULATORY_CARE_PROVIDER_SITE_OTHER): Payer: BC Managed Care – PPO | Admitting: Internal Medicine

## 2013-10-24 ENCOUNTER — Encounter: Payer: Self-pay | Admitting: Internal Medicine

## 2013-10-24 VITALS — BP 90/64 | HR 108 | Ht 62.6 in | Wt 141.4 lb

## 2013-10-24 DIAGNOSIS — Z1211 Encounter for screening for malignant neoplasm of colon: Secondary | ICD-10-CM

## 2013-10-24 DIAGNOSIS — R1013 Epigastric pain: Secondary | ICD-10-CM

## 2013-10-24 DIAGNOSIS — F439 Reaction to severe stress, unspecified: Secondary | ICD-10-CM

## 2013-10-24 DIAGNOSIS — Z733 Stress, not elsewhere classified: Secondary | ICD-10-CM

## 2013-10-24 DIAGNOSIS — K589 Irritable bowel syndrome without diarrhea: Secondary | ICD-10-CM

## 2013-10-24 MED ORDER — NA SULFATE-K SULFATE-MG SULF 17.5-3.13-1.6 GM/177ML PO SOLN: ORAL | Status: DC

## 2013-10-24 MED ORDER — GLYCOPYRROLATE 2 MG PO TABS: 2.0000 mg | ORAL_TABLET | Freq: Two times a day (BID) | ORAL | Status: DC

## 2013-10-24 NOTE — Assessment & Plan Note (Addendum)
Switch dicyclomine and glycopyrrolate 2 mg twice a day. Of late her situational stressors will improve, I suspect this is driving most of her problems right now if not all of them.
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
## 2013-10-24 NOTE — Patient Instructions (Addendum)
You have been scheduled for an endoscopy and colonoscopy with propofol. Please follow the written instructions given to you at your visit today. Please pick up your prep at the pharmacy within the next 1-3 days. If you use inhalers (even only as needed), please bring them with you on the day of your procedure. Your physician has requested that you go to www.startemmi.com and enter the access code given to you at your visit today. This web site gives a general overview about your procedure. However, you should still follow specific instructions given to you by our office regarding your preparation for the procedure.   Dr. Carlean Purl is changing your dicyclomine to generic Robinul.     I appreciate the opportunity to care for you.

## 2013-10-27 ENCOUNTER — Encounter: Payer: Self-pay | Admitting: Internal Medicine

## 2013-11-25 ENCOUNTER — Encounter: Payer: BC Managed Care – PPO | Admitting: Internal Medicine

## 2013-12-12 DIAGNOSIS — M5137 Other intervertebral disc degeneration, lumbosacral region: Secondary | ICD-10-CM | POA: Insufficient documentation

## 2013-12-12 DIAGNOSIS — N200 Calculus of kidney: Secondary | ICD-10-CM | POA: Insufficient documentation

## 2013-12-18 ENCOUNTER — Encounter: Payer: BC Managed Care – PPO | Admitting: Internal Medicine

## 2013-12-23 ENCOUNTER — Emergency Department (HOSPITAL_COMMUNITY): Payer: BC Managed Care – PPO

## 2013-12-23 ENCOUNTER — Emergency Department (HOSPITAL_COMMUNITY)
Admission: EM | Admit: 2013-12-23 | Discharge: 2013-12-24 | Disposition: A | Discharge status: Home / Self Care | Payer: BC Managed Care – PPO | Attending: Emergency Medicine | Admitting: Emergency Medicine

## 2013-12-23 ENCOUNTER — Telehealth: Payer: Self-pay | Admitting: Internal Medicine

## 2013-12-23 ENCOUNTER — Encounter (HOSPITAL_COMMUNITY): Payer: Self-pay | Admitting: Emergency Medicine

## 2013-12-23 DIAGNOSIS — Z85828 Personal history of other malignant neoplasm of skin: Secondary | ICD-10-CM | POA: Insufficient documentation

## 2013-12-23 DIAGNOSIS — G894 Chronic pain syndrome: Secondary | ICD-10-CM | POA: Insufficient documentation

## 2013-12-23 DIAGNOSIS — R109 Unspecified abdominal pain: Secondary | ICD-10-CM

## 2013-12-23 DIAGNOSIS — G8929 Other chronic pain: Secondary | ICD-10-CM | POA: Insufficient documentation

## 2013-12-23 DIAGNOSIS — Z9089 Acquired absence of other organs: Secondary | ICD-10-CM | POA: Insufficient documentation

## 2013-12-23 DIAGNOSIS — Z87440 Personal history of urinary (tract) infections: Secondary | ICD-10-CM | POA: Insufficient documentation

## 2013-12-23 DIAGNOSIS — R112 Nausea with vomiting, unspecified: Secondary | ICD-10-CM

## 2013-12-23 DIAGNOSIS — R071 Chest pain on breathing: Secondary | ICD-10-CM | POA: Insufficient documentation

## 2013-12-23 DIAGNOSIS — M1990 Unspecified osteoarthritis, unspecified site: Secondary | ICD-10-CM | POA: Insufficient documentation

## 2013-12-23 DIAGNOSIS — F419 Anxiety disorder, unspecified: Secondary | ICD-10-CM

## 2013-12-23 DIAGNOSIS — F411 Generalized anxiety disorder: Secondary | ICD-10-CM | POA: Insufficient documentation

## 2013-12-23 DIAGNOSIS — Z8719 Personal history of other diseases of the digestive system: Secondary | ICD-10-CM | POA: Insufficient documentation

## 2013-12-23 DIAGNOSIS — F329 Major depressive disorder, single episode, unspecified: Secondary | ICD-10-CM | POA: Insufficient documentation

## 2013-12-23 DIAGNOSIS — Z79899 Other long term (current) drug therapy: Secondary | ICD-10-CM | POA: Insufficient documentation

## 2013-12-23 DIAGNOSIS — Z880 Allergy status to penicillin: Secondary | ICD-10-CM | POA: Insufficient documentation

## 2013-12-23 DIAGNOSIS — Z9079 Acquired absence of other genital organ(s): Secondary | ICD-10-CM | POA: Insufficient documentation

## 2013-12-23 DIAGNOSIS — Z8742 Personal history of other diseases of the female genital tract: Secondary | ICD-10-CM | POA: Insufficient documentation

## 2013-12-23 DIAGNOSIS — Z9889 Other specified postprocedural states: Secondary | ICD-10-CM | POA: Insufficient documentation

## 2013-12-23 DIAGNOSIS — R52 Pain, unspecified: Secondary | ICD-10-CM | POA: Insufficient documentation

## 2013-12-23 DIAGNOSIS — F3289 Other specified depressive episodes: Secondary | ICD-10-CM | POA: Insufficient documentation

## 2013-12-23 DIAGNOSIS — R1011 Right upper quadrant pain: Secondary | ICD-10-CM | POA: Insufficient documentation

## 2013-12-23 DIAGNOSIS — Z87442 Personal history of urinary calculi: Secondary | ICD-10-CM | POA: Insufficient documentation

## 2013-12-23 DIAGNOSIS — R1084 Generalized abdominal pain: Secondary | ICD-10-CM | POA: Insufficient documentation

## 2013-12-23 DIAGNOSIS — R0789 Other chest pain: Secondary | ICD-10-CM

## 2013-12-23 DIAGNOSIS — Z791 Long term (current) use of non-steroidal anti-inflammatories (NSAID): Secondary | ICD-10-CM | POA: Insufficient documentation

## 2013-12-23 DIAGNOSIS — F430 Acute stress reaction: Secondary | ICD-10-CM | POA: Insufficient documentation

## 2013-12-23 DIAGNOSIS — Z8781 Personal history of (healed) traumatic fracture: Secondary | ICD-10-CM | POA: Insufficient documentation

## 2013-12-23 DIAGNOSIS — R1013 Epigastric pain: Secondary | ICD-10-CM | POA: Insufficient documentation

## 2013-12-23 DIAGNOSIS — Z90710 Acquired absence of both cervix and uterus: Secondary | ICD-10-CM | POA: Insufficient documentation

## 2013-12-23 HISTORY — DX: Other specified symptoms and signs involving the digestive system and abdomen: R19.8

## 2013-12-23 HISTORY — DX: Other chronic pain: R10.9

## 2013-12-23 HISTORY — DX: Other chronic pain: G89.29

## 2013-12-23 HISTORY — DX: Functional dyspepsia: K30

## 2013-12-23 LAB — CBC WITH DIFFERENTIAL/PLATELET
Basophils Absolute: 0.1 10*3/uL (ref 0.0–0.1)
Basophils Relative: 1 % (ref 0–1)
EOS ABS: 0.1 10*3/uL (ref 0.0–0.7)
Eosinophils Relative: 2 % (ref 0–5)
HCT: 46 % (ref 36.0–46.0)
Hemoglobin: 16.1 g/dL — ABNORMAL HIGH (ref 12.0–15.0)
LYMPHS ABS: 2.1 10*3/uL (ref 0.7–4.0)
Lymphocytes Relative: 24 % (ref 12–46)
MCH: 32.3 pg (ref 26.0–34.0)
MCHC: 35 g/dL (ref 30.0–36.0)
MCV: 92.2 fL (ref 78.0–100.0)
Monocytes Absolute: 0.9 10*3/uL (ref 0.1–1.0)
Monocytes Relative: 11 % (ref 3–12)
NEUTROS PCT: 64 % (ref 43–77)
Neutro Abs: 5.6 10*3/uL (ref 1.7–7.7)
PLATELETS: 314 10*3/uL (ref 150–400)
RBC: 4.99 MIL/uL (ref 3.87–5.11)
RDW: 12.2 % (ref 11.5–15.5)
WBC: 8.8 10*3/uL (ref 4.0–10.5)

## 2013-12-23 LAB — COMPREHENSIVE METABOLIC PANEL
ALT: 52 U/L — ABNORMAL HIGH (ref 0–35)
AST: 52 U/L — ABNORMAL HIGH (ref 0–37)
Albumin: 4 g/dL (ref 3.5–5.2)
Alkaline Phosphatase: 124 U/L — ABNORMAL HIGH (ref 39–117)
BUN: 11 mg/dL (ref 6–23)
CO2: 27 meq/L (ref 19–32)
CREATININE: 0.88 mg/dL (ref 0.50–1.10)
Calcium: 9 mg/dL (ref 8.4–10.5)
Chloride: 100 mEq/L (ref 96–112)
GFR, EST AFRICAN AMERICAN: 83 mL/min — AB (ref >90)
GFR, EST NON AFRICAN AMERICAN: 72 mL/min — AB (ref >90)
GLUCOSE: 73 mg/dL (ref 70–99)
Potassium: 3.5 mEq/L — ABNORMAL LOW (ref 3.7–5.3)
Sodium: 139 mEq/L (ref 137–147)
TOTAL PROTEIN: 7.9 g/dL (ref 6.0–8.3)
Total Bilirubin: 0.4 mg/dL (ref 0.3–1.2)

## 2013-12-23 LAB — URINALYSIS, ROUTINE W REFLEX MICROSCOPIC
Bilirubin Urine: NEGATIVE
GLUCOSE, UA: NEGATIVE mg/dL
HGB URINE DIPSTICK: NEGATIVE
Ketones, ur: NEGATIVE mg/dL
Leukocytes, UA: NEGATIVE
Nitrite: NEGATIVE
Protein, ur: NEGATIVE mg/dL
Urobilinogen, UA: 0.2 mg/dL (ref 0.0–1.0)
pH: 6 (ref 5.0–8.0)

## 2013-12-23 LAB — TROPONIN I: Troponin I: <0.3 ng/mL (ref <0.30)

## 2013-12-23 LAB — LIPASE, BLOOD: Lipase: 27 U/L (ref 11–59)

## 2013-12-23 MED ORDER — IOHEXOL 300 MG/ML  SOLN
50.0000 mL | Freq: Once | INTRAMUSCULAR | Status: AC | PRN
  Administered 2013-12-23: 50 mL via ORAL

## 2013-12-23 MED ORDER — MORPHINE SULFATE 4 MG/ML IJ SOLN
4.0000 mg | INTRAMUSCULAR | Status: DC | PRN
  Administered 2013-12-23: 4 mg via INTRAVENOUS
  Filled 2013-12-23: qty 1

## 2013-12-23 MED ORDER — ONDANSETRON HCL 8 MG PO TABS: 8.0000 mg | ORAL_TABLET | ORAL | Status: DC | PRN

## 2013-12-23 MED ORDER — IOHEXOL 300 MG/ML  SOLN
100.0000 mL | Freq: Once | INTRAMUSCULAR | Status: AC | PRN
  Administered 2013-12-23: 100 mL via INTRAVENOUS

## 2013-12-23 MED ORDER — ONDANSETRON HCL 4 MG/2ML IJ SOLN
4.0000 mg | INTRAMUSCULAR | Status: AC | PRN
  Administered 2013-12-23 (×2): 4 mg via INTRAVENOUS
  Filled 2013-12-23 (×2): qty 2

## 2013-12-23 MED ORDER — OXYCODONE-ACETAMINOPHEN 5-325 MG PO TABS: 2.0000 | ORAL_TABLET | ORAL | Status: DC | PRN

## 2013-12-23 MED ORDER — SODIUM CHLORIDE 0.9 % IV SOLN
INTRAVENOUS | Status: DC
  Administered 2013-12-23: 21:00:00 via INTRAVENOUS

## 2013-12-23 NOTE — ED Provider Notes (Signed)
CSN: [id number]     Arrival date & time 12/23/13  1735 History   First MD Initiated Contact with Patient 12/23/13 1912     Chief Complaint  Patient presents with  . Chest Pain  . Abdominal Pain      HPI Pt was seen at 1940.  Per pt, c/o gradual onset and persistence of constant acute flair of her chronic generalized abd "pain" for the past 1 week. Has been associated with multiple intermittent episodes of N/V.  Describes the abd pain as "cramping." Pt also c/o gradual onset and persistence of constant mid-sternal chest "pain" for the past 3 days. Pt endorses she has "been under a lot of stress" and came to the ED "to get a check up." Denies fevers, no back pain, no rash, no palpitations, no cough/SOB, no black or blood in stools or emesis.      GI: Dr. Carlean Purl Past Medical History  Diagnosis Date  . UTI 10/06/2010  . ROTATOR CUFF REPAIR, HX OF 09/11/2007  . APPENDECTOMY, HX OF 09/11/2007  . TAH/BSO, HX OF 09/11/2007  . HX, PERSONAL, MUSCULOSKELETAL DISORD NEC 07/16/2007  . HX, PERSONAL, URINARY CALCULI 07/16/2007  . HIATAL HERNIA, HX OF 09/11/2007  . FX, RAMUS NOS, CLOSED 07/16/2007  . ABDOMINAL PAIN, LOWER 02/16/2009  . SYMPTOM, PAIN, ABDOMINAL, RIGHT UP QUADRANT 07/16/2007  . Dysuria 02/20/2008  . CHEST PAIN, ACUTE 09/03/2008  . OSTEOPENIA 10/23/2007  . BACK PAIN 07/16/2007    chronic  . CERVICAL RADICULOPATHY, RIGHT 07/16/2007  . OSTEOARTHRITIS 09/11/2007  . VAGINITIS, ATROPHIC, POSTMENOPAUSAL 07/16/2007  . Irritable bowel syndrome 09/11/2007  . HEMORRHOIDS 09/11/2007  . Chronic pain syndrome 09/11/2007  . ATTENTION DEFICIT DISORDER, ADULT 02/02/2009  . DEPRESSION/ANXIETY 06/17/2010  . NEOPLASM, SKIN, UNCERTAIN BEHAVIOR 04/01/5884  . Suicidal ideations   . Chronic abdominal pain   . Functional GI symptoms    Past Surgical History  Procedure Laterality Date  . Appendectomy    . Rotator cuff repair    . Total abdominal hysterectomy w/ bilateral salpingoophorectomy    .  Cervical radiculopathy      RT  . Esophagogastroduodenoscopy    . Colonoscopy     Family History  Problem Relation Age of Onset  . Heart disease Mother     CAD/ valvular disease  . Kidney disease Mother     lost a kidney -  unknown cause  . Heart disease Brother     CAD  . Cancer Other     Colon  . COPD Father   . Heart disease Father 23    MI  . Heart disease Sister   . Heart disease Maternal Aunt   . Heart disease Maternal Grandmother    History  Substance Use Topics  . Smoking status: Never Smoker   . Smokeless tobacco: Never Used  . Alcohol Use: No    Review of Systems ROS: Statement: All systems negative except as marked or noted in the HPI; Constitutional: Negative for fever and chills. ; ; Eyes: Negative for eye pain, redness and discharge. ; ; ENMT: Negative for ear pain, hoarseness, nasal congestion, sinus pressure and sore throat. ; ; Cardiovascular: +CP. Negative for palpitations, diaphoresis, dyspnea and peripheral edema. ; ; Respiratory: Negative for cough, wheezing and stridor. ; ; Gastrointestinal: +N/V, abd pain. Negative for diarrhea, blood in stool, hematemesis, jaundice and rectal bleeding. . ; ; Genitourinary: Negative for dysuria, flank pain and hematuria. ; ; Musculoskeletal: Negative for back pain and neck pain. Negative for  swelling and trauma.; ; Skin: Negative for pruritus, rash, abrasions, blisters, bruising and skin lesion.; ; Neuro: Negative for headache, lightheadedness and neck stiffness. Negative for weakness, altered level of consciousness , altered mental status, extremity weakness, paresthesias, involuntary movement, seizure and syncope.; Psych:  +anxiety. No SI, no SA, no HI, no hallucinations.     Allergies  Ciprofloxacin; Atorvastatin; Buspirone hcl; Ezetimibe-simvastatin; and Penicillins  Home Medications   Current Outpatient Rx  Name  Route  Sig  Dispense  Refill  . ALPRAZolam (XANAX) 1 MG tablet   Oral   Take 1 mg by mouth 4 (four)  times daily.          . celecoxib (CELEBREX) 200 MG capsule   Oral   Take 200 mg by mouth daily as needed for mild pain or moderate pain.          . fentaNYL (DURAGESIC - DOSED MCG/HR) 75 MCG/HR   Transdermal   Place 150 mcg onto the skin every 3 (three) days.          Marland Kitchen glycopyrrolate (ROBINUL) 2 MG tablet   Oral   Take 1 tablet (2 mg total) by mouth 2 (two) times daily.   60 tablet   3   . methocarbamol (ROBAXIN) 750 MG tablet   Oral   Take 750 mg by mouth every 8 (eight) hours as needed for muscle spasms.          . Na Sulfate-K Sulfate-Mg Sulf SOLN      Use as directed         . omeprazole (PRILOSEC) 20 MG capsule   Oral   Take 1 capsule (20 mg total) by mouth 2 (two) times daily.   60 capsule   2   . ondansetron (ZOFRAN ODT) 4 MG disintegrating tablet   Oral   Take 1 tablet (4 mg total) by mouth every 8 (eight) hours as needed for nausea or vomiting.   60 tablet   2   . oxyCODONE (OXYCONTIN) 15 MG TB12   Oral   Take 15 mg by mouth every 6 (six) hours as needed (for pain).          . polyethylene glycol powder (GLYCOLAX/MIRALAX) powder   Oral   Take 17 g by mouth daily as needed for mild constipation or moderate constipation.         . traZODone (DESYREL) 50 MG tablet   Oral   Take 150 mg by mouth at bedtime. Can take up to 3 pills         . venlafaxine XR (EFFEXOR-XR) 150 MG 24 hr capsule   Oral   Take 300 mg by mouth daily.         Marland Kitchen BRINTELLIX 20 MG TABS   Oral   Take 20 mg by mouth as needed.          . promethazine (PROMETHEGAN) 25 MG suppository   Rectal   Place 1 suppository (25 mg total) rectally every 6 (six) hours as needed for nausea (place 1 suppository every 6 hrs as needed for nausea).   12 each   5    BP 117/72  Pulse 106  Temp(Src) 97.8 F (36.6 C) (Oral)  Resp 18  Ht 5\' 2"  (1.575 m)  Wt 135 lb (61.236 kg)  BMI 24.69 kg/m2  SpO2 97% Physical Exam 1945: Physical examination:  Nursing notes reviewed; Vital  signs and O2 SAT reviewed;  Constitutional: Well developed, Well nourished, Well hydrated, In no  acute distress; Head:  Normocephalic, atraumatic; Eyes: EOMI, PERRL, No scleral icterus; ENMT: Mouth and pharynx normal, Mucous membranes moist; Neck: Supple, Full range of motion, No lymphadenopathy; Cardiovascular: Regular rate and rhythm, No murmur, rub, or gallop; Respiratory: Breath sounds clear & equal bilaterally, No rales, rhonchi, wheezes.  Speaking full sentences with ease, Normal respiratory effort/excursion; Chest: Nontender, Movement normal; Abdomen: Soft, +RUQ, mid-epigastric areas tender to palp. Nondistended, Normal bowel sounds; Genitourinary: No CVA tenderness; Extremities: Pulses normal, No tenderness, No edema, No calf edema or asymmetry.; Neuro: AA&Ox3, Major CN grossly intact.  Speech clear. No gross focal motor or sensory deficits in extremities.; Skin: Color normal, Warm, Dry.; Psych:  Affect flat, poor eye contact.    ED Course  Procedures     EKG Interpretation   Date/Time:  Tuesday December 23 2013 17:48:27 EDT Ventricular Rate:  103 PR Interval:  144 QRS Duration: 82 QT Interval:  320 QTC Calculation: 419 R Axis:   87 Text Interpretation:  Sinus tachycardia Artifact Otherwise normal ECG When  compared with ECG of 09/03/2008 No significant change was found  Reconfirmed by [facility]  MD, Nunzio Cory [id number]) on 12/23/2013 7:53:33 PM      MDM  MDM Reviewed: previous chart, nursing note and vitals Reviewed previous: labs and ECG Interpretation: labs, ECG and x-ray    Results for orders placed during the hospital encounter of 12/23/13  CBC WITH DIFFERENTIAL      Result Value Ref Range   WBC 8.8  4.0 - 10.5 K/uL   RBC 4.99  3.87 - 5.11 MIL/uL   Hemoglobin 16.1 (*) 12.0 - 15.0 g/dL   HCT 46.0  36.0 - 46.0 %   MCV 92.2  78.0 - 100.0 fL   MCH 32.3  26.0 - 34.0 pg   MCHC 35.0  30.0 - 36.0 g/dL   RDW 12.2  11.5 - 15.5 %   Platelets 314  150 - 400 K/uL   Neutrophils  Relative % 64  43 - 77 %   Neutro Abs 5.6  1.7 - 7.7 K/uL   Lymphocytes Relative 24  12 - 46 %   Lymphs Abs 2.1  0.7 - 4.0 K/uL   Monocytes Relative 11  3 - 12 %   Monocytes Absolute 0.9  0.1 - 1.0 K/uL   Eosinophils Relative 2  0 - 5 %   Eosinophils Absolute 0.1  0.0 - 0.7 K/uL   Basophils Relative 1  0 - 1 %   Basophils Absolute 0.1  0.0 - 0.1 K/uL  COMPREHENSIVE METABOLIC PANEL      Result Value Ref Range   Sodium 139  137 - 147 mEq/L   Potassium 3.5 (*) 3.7 - 5.3 mEq/L   Chloride 100  96 - 112 mEq/L   CO2 27  19 - 32 mEq/L   Glucose, Bld 73  70 - 99 mg/dL   BUN 11  6 - 23 mg/dL   Creatinine, Ser 0.88  0.50 - 1.10 mg/dL   Calcium 9.0  8.4 - 10.5 mg/dL   Total Protein 7.9  6.0 - 8.3 g/dL   Albumin 4.0  3.5 - 5.2 g/dL   AST 52 (*) 0 - 37 U/L   ALT 52 (*) 0 - 35 U/L   Alkaline Phosphatase 124 (*) 39 - 117 U/L   Total Bilirubin 0.4  0.3 - 1.2 mg/dL   GFR calc non Af Amer 72 (*) >90 mL/min   GFR calc Af Amer 83 (*) >90 mL/min  TROPONIN  I      Result Value Ref Range   Troponin I <0.30  <0.30 ng/mL  LIPASE, BLOOD      Result Value Ref Range   Lipase 27  11 - 59 U/L   Dg Chest 2 View 12/23/2013   CLINICAL DATA:  CHEST PAIN  EXAM: CHEST  2 VIEW  COMPARISON:  DG CHEST 2 VIEW dated 10/14/2012  FINDINGS: The heart size and mediastinal contours are within normal limits. Both lungs are clear. The visualized skeletal structures are unremarkable. Stimulator electrodes are noted in the central canal in the mid thoracic level unchanged.  IMPRESSION: No acute cardiopulmonary process.   Electronically Signed   By: Suzy Bouchard M.D.   On: 12/23/2013 18:13   Results for CORRENE, KOEHN (MRN [id number]) as of 12/23/2013 21:41  Ref. Range 07/24/2011 15:30 07/24/2011 15:30 10/14/2012 16:39 12/23/2013 17:51  AST Latest Range: 0-37 U/L 34 34 39 (H) 52 (H)  ALT Latest Range: 0-35 U/L 45 (H) 45 (H) 32 52 (H)    2155:  Doubt PE as cause for symptoms with low risk [place].  Doubt ACS as cause for  symptoms with normal troponin and unchanged EKG from previous after 3 days of constant symptoms. LFT's elevated today compared with previous; labs otherwise reassuring. CT A/P pending. Sign out to Dr. Lacinda Axon.   Alfonzo Feller, DO 12/23/13 2156

## 2013-12-23 NOTE — ED Notes (Addendum)
Chest pain since Saturday. Abd pain for 1 week,  N/V

## 2013-12-23 NOTE — Discharge Instructions (Signed)
CT scan showed no acute findings. Medication for pain and nausea. Followup your regular Dr.

## 2013-12-23 NOTE — ED Provider Notes (Signed)
CT scan reviewed with patient and her significant other. Elevated AST, ALT, alkaline phosphatase. She has primary care followup. Discharge medications Percocet and Zofran 8 mg  Nat Christen, MD 12/23/13 2304

## 2013-12-23 NOTE — Telephone Encounter (Signed)
Emergent call CP with SOB placed.  When the call was transferred to RN Pt no longer on the line.  Attempted call and LM 08:36

## 2013-12-25 LAB — URINE CULTURE
Colony Count: NO GROWTH
Culture: NO GROWTH

## 2014-01-16 ENCOUNTER — Emergency Department (HOSPITAL_COMMUNITY)
Admission: EM | Admit: 2014-01-16 | Discharge: 2014-01-16 | Disposition: A | Discharge status: Home / Self Care | Payer: BC Managed Care – PPO | Attending: Emergency Medicine | Admitting: Emergency Medicine

## 2014-01-16 ENCOUNTER — Encounter (HOSPITAL_COMMUNITY): Payer: Self-pay | Admitting: Emergency Medicine

## 2014-01-16 DIAGNOSIS — R1031 Right lower quadrant pain: Secondary | ICD-10-CM | POA: Insufficient documentation

## 2014-01-16 DIAGNOSIS — Z8719 Personal history of other diseases of the digestive system: Secondary | ICD-10-CM | POA: Insufficient documentation

## 2014-01-16 DIAGNOSIS — Z85828 Personal history of other malignant neoplasm of skin: Secondary | ICD-10-CM | POA: Insufficient documentation

## 2014-01-16 DIAGNOSIS — Z87440 Personal history of urinary (tract) infections: Secondary | ICD-10-CM | POA: Insufficient documentation

## 2014-01-16 DIAGNOSIS — Z9889 Other specified postprocedural states: Secondary | ICD-10-CM | POA: Insufficient documentation

## 2014-01-16 DIAGNOSIS — Z8739 Personal history of other diseases of the musculoskeletal system and connective tissue: Secondary | ICD-10-CM | POA: Insufficient documentation

## 2014-01-16 DIAGNOSIS — R109 Unspecified abdominal pain: Secondary | ICD-10-CM

## 2014-01-16 DIAGNOSIS — Z8679 Personal history of other diseases of the circulatory system: Secondary | ICD-10-CM | POA: Insufficient documentation

## 2014-01-16 DIAGNOSIS — Z880 Allergy status to penicillin: Secondary | ICD-10-CM | POA: Insufficient documentation

## 2014-01-16 DIAGNOSIS — F341 Dysthymic disorder: Secondary | ICD-10-CM | POA: Insufficient documentation

## 2014-01-16 DIAGNOSIS — Z79899 Other long term (current) drug therapy: Secondary | ICD-10-CM | POA: Insufficient documentation

## 2014-01-16 DIAGNOSIS — Z8742 Personal history of other diseases of the female genital tract: Secondary | ICD-10-CM | POA: Insufficient documentation

## 2014-01-16 DIAGNOSIS — G8929 Other chronic pain: Secondary | ICD-10-CM | POA: Insufficient documentation

## 2014-01-16 LAB — COMPREHENSIVE METABOLIC PANEL
ALT: 26 U/L (ref 0–35)
AST: 22 U/L (ref 0–37)
Albumin: 3.7 g/dL (ref 3.5–5.2)
Alkaline Phosphatase: 97 U/L (ref 39–117)
BUN: 10 mg/dL (ref 6–23)
CO2: 28 mEq/L (ref 19–32)
Calcium: 8.9 mg/dL (ref 8.4–10.5)
Chloride: 105 mEq/L (ref 96–112)
Creatinine, Ser: 0.82 mg/dL (ref 0.50–1.10)
GFR calc Af Amer: 90 mL/min — ABNORMAL LOW (ref >90)
GFR calc non Af Amer: 77 mL/min — ABNORMAL LOW (ref >90)
GLUCOSE: 123 mg/dL — AB (ref 70–99)
Potassium: 4.1 mEq/L (ref 3.7–5.3)
SODIUM: 143 meq/L (ref 137–147)
Total Bilirubin: 0.3 mg/dL (ref 0.3–1.2)
Total Protein: 7 g/dL (ref 6.0–8.3)

## 2014-01-16 LAB — CBC WITH DIFFERENTIAL/PLATELET
Basophils Absolute: 0 10*3/uL (ref 0.0–0.1)
Basophils Relative: 0 % (ref 0–1)
Eosinophils Absolute: 0 10*3/uL (ref 0.0–0.7)
Eosinophils Relative: 0 % (ref 0–5)
HEMATOCRIT: 41.2 % (ref 36.0–46.0)
Hemoglobin: 14.3 g/dL (ref 12.0–15.0)
LYMPHS PCT: 8 % — AB (ref 12–46)
Lymphs Abs: 0.7 10*3/uL (ref 0.7–4.0)
MCH: 32.1 pg (ref 26.0–34.0)
MCHC: 34.7 g/dL (ref 30.0–36.0)
MCV: 92.4 fL (ref 78.0–100.0)
MONO ABS: 0.4 10*3/uL (ref 0.1–1.0)
MONOS PCT: 5 % (ref 3–12)
Neutro Abs: 7.2 10*3/uL (ref 1.7–7.7)
Neutrophils Relative %: 87 % — ABNORMAL HIGH (ref 43–77)
Platelets: 292 10*3/uL (ref 150–400)
RBC: 4.46 MIL/uL (ref 3.87–5.11)
RDW: 12.7 % (ref 11.5–15.5)
WBC: 8.4 10*3/uL (ref 4.0–10.5)

## 2014-01-16 LAB — LIPASE, BLOOD: Lipase: 32 U/L (ref 11–59)

## 2014-01-16 MED ORDER — DICYCLOMINE HCL 20 MG PO TABS: 20.0000 mg | ORAL_TABLET | Freq: Three times a day (TID) | ORAL | Status: DC | PRN

## 2014-01-16 MED ORDER — PROMETHAZINE HCL 25 MG/ML IJ SOLN
25.0000 mg | Freq: Once | INTRAMUSCULAR | Status: AC
  Administered 2014-01-16: 25 mg via INTRAMUSCULAR
  Filled 2014-01-16: qty 1

## 2014-01-16 MED ORDER — OXYCODONE-ACETAMINOPHEN 5-325 MG PO TABS
2.0000 | ORAL_TABLET | Freq: Once | ORAL | Status: AC
  Administered 2014-01-16: 2 via ORAL
  Filled 2014-01-16: qty 2

## 2014-01-16 MED ORDER — ONDANSETRON 8 MG PO TBDP
8.0000 mg | ORAL_TABLET | Freq: Once | ORAL | Status: AC
  Administered 2014-01-16: 8 mg via ORAL
  Filled 2014-01-16: qty 1

## 2014-01-16 NOTE — ED Notes (Signed)
C/o nausea. MD aware. Order for 8 mg Zofran ODT obtained.

## 2014-01-16 NOTE — ED Notes (Signed)
Patient c/o severe abd pain with nausea and vomiting. Patient denies any diarrhea but states "I feel like I'm about to start having it." Patient seen at urgent care and sent here. Per patient had 2 abd x-rays with nothing showing and IM injection of phenergan. Patient reports only slight relief from nausea. Unsure of any fevers. Reports frequency and pain with urination but patient states "I keep that all the time because my bladder is in my vagina." No active vomiting noted in triage at this time.

## 2014-01-16 NOTE — ED Provider Notes (Addendum)
CSN: [id number]     Arrival date & time 01/16/14  1536 History   First MD Initiated Contact with Patient 01/16/14 1608     Chief Complaint  Patient presents with  . Abdominal Pain  . Emesis     (Consider location/radiation/quality/duration/timing/severity/associated sxs/prior Treatment) Patient is a 58 y.o. female presenting with abdominal pain and vomiting. The history is provided by the patient.  Abdominal Pain Associated symptoms: vomiting   Emesis Associated symptoms: abdominal pain    She has had ongoing abdominal pain, for 3-1/2 weeks, since being evaluated here. She states that she could not followup with her primary care Dr., as directed, because he has retired. She states that she also, try to call her GI doctor, but he would not see her until her blood was rechecked. She denies fever, chills, back, pain. No weakness, or dizziness. She understands that she was told that she was constipated, but is not concerned about no bowel movements, because she is not eating. She cannot recall when her last bowel movement was. She is using her medications, without relief. She's had similar abdominal pain, for many years. This pain has prevented her from working. There are no other known modifying factors.  Past Medical History  Diagnosis Date  . UTI 10/06/2010  . ROTATOR CUFF REPAIR, HX OF 09/11/2007  . APPENDECTOMY, HX OF 09/11/2007  . TAH/BSO, HX OF 09/11/2007  . HX, PERSONAL, MUSCULOSKELETAL DISORD NEC 07/16/2007  . HX, PERSONAL, URINARY CALCULI 07/16/2007  . HIATAL HERNIA, HX OF 09/11/2007  . FX, RAMUS NOS, CLOSED 07/16/2007  . ABDOMINAL PAIN, LOWER 02/16/2009  . SYMPTOM, PAIN, ABDOMINAL, RIGHT UP QUADRANT 07/16/2007  . Dysuria 02/20/2008  . CHEST PAIN, ACUTE 09/03/2008  . OSTEOPENIA 10/23/2007  . BACK PAIN 07/16/2007    chronic  . CERVICAL RADICULOPATHY, RIGHT 07/16/2007  . OSTEOARTHRITIS 09/11/2007  . VAGINITIS, ATROPHIC, POSTMENOPAUSAL 07/16/2007  . Irritable bowel syndrome  09/11/2007  . HEMORRHOIDS 09/11/2007  . Chronic pain syndrome 09/11/2007  . ATTENTION DEFICIT DISORDER, ADULT 02/02/2009  . DEPRESSION/ANXIETY 06/17/2010  . NEOPLASM, SKIN, UNCERTAIN BEHAVIOR 4/0/9811  . Suicidal ideations   . Chronic abdominal pain   . Functional GI symptoms    Past Surgical History  Procedure Laterality Date  . Appendectomy    . Rotator cuff repair    . Total abdominal hysterectomy w/ bilateral salpingoophorectomy    . Cervical radiculopathy      RT  . Esophagogastroduodenoscopy    . Colonoscopy     Family History  Problem Relation Age of Onset  . Heart disease Mother     CAD/ valvular disease  . Kidney disease Mother     lost a kidney -  unknown cause  . Heart disease Brother     CAD  . Cancer Other     Colon  . COPD Father   . Heart disease Father 9    MI  . Heart disease Sister   . Heart disease Maternal Aunt   . Heart disease Maternal Grandmother    History  Substance Use Topics  . Smoking status: Never Smoker   . Smokeless tobacco: Never Used  . Alcohol Use: No   OB History   Grav Para Term Preterm Abortions TAB SAB Ect Mult Living                 Review of Systems  Gastrointestinal: Positive for vomiting and abdominal pain.  All other systems reviewed and are negative.     Allergies  Ciprofloxacin;  Atorvastatin; Buspirone hcl; Ezetimibe-simvastatin; Penicillins; and Sulfa antibiotics  Home Medications   Prior to Admission medications   Medication Sig Start Date End Date Taking? Authorizing Provider  ALPRAZolam Duanne Moron) 1 MG tablet Take 1 mg by mouth 4 (four) times daily.  05/15/13   Neena Rhymes, MD  BRINTELLIX 20 MG TABS Take 20 mg by mouth as needed.  08/10/13   Historical Provider, MD  celecoxib (CELEBREX) 200 MG capsule Take 200 mg by mouth daily as needed for mild pain or moderate pain.  10/09/13   Historical Provider, MD  fentaNYL (DURAGESIC - DOSED MCG/HR) 75 MCG/HR Place 150 mcg onto the skin every 3 (three) days.   10/15/13   Historical Provider, MD  glycopyrrolate (ROBINUL) 2 MG tablet Take 1 tablet (2 mg total) by mouth 2 (two) times daily. 10/24/13   Gatha Mayer, MD  methocarbamol (ROBAXIN) 750 MG tablet Take 750 mg by mouth every 8 (eight) hours as needed for muscle spasms.  10/15/13   Historical Provider, MD  Na Sulfate-K Sulfate-Mg Sulf SOLN Use as directed 10/24/13   Gatha Mayer, MD  omeprazole (PRILOSEC) 20 MG capsule Take 1 capsule (20 mg total) by mouth 2 (two) times daily. 04/09/13   Neena Rhymes, MD  ondansetron (ZOFRAN ODT) 4 MG disintegrating tablet Take 1 tablet (4 mg total) by mouth every 8 (eight) hours as needed for nausea or vomiting. 10/02/13   Neena Rhymes, MD  ondansetron (ZOFRAN) 8 MG tablet Take 1 tablet (8 mg total) by mouth every 4 (four) hours as needed for nausea or vomiting. 12/23/13   Nat Christen, MD  oxyCODONE (OXYCONTIN) 15 MG TB12 Take 15 mg by mouth every 6 (six) hours as needed (for pain).     Historical Provider, MD  oxyCODONE-acetaminophen (PERCOCET) 5-325 MG per tablet Take 2 tablets by mouth every 4 (four) hours as needed. 12/23/13   Nat Christen, MD  polyethylene glycol powder (GLYCOLAX/MIRALAX) powder Take 17 g by mouth daily as needed for mild constipation or moderate constipation.    Historical Provider, MD  promethazine (PROMETHEGAN) 25 MG suppository Place 1 suppository (25 mg total) rectally every 6 (six) hours as needed for nausea (place 1 suppository every 6 hrs as needed for nausea). 07/29/13   Neena Rhymes, MD  traZODone (DESYREL) 50 MG tablet Take 150 mg by mouth at bedtime. Can take up to 3 pills 09/15/13   Historical Provider, MD  venlafaxine XR (EFFEXOR-XR) 150 MG 24 hr capsule Take 300 mg by mouth daily.    Historical Provider, MD   BP 125/79  Pulse 81  Temp(Src) 97.5 F (36.4 C) (Oral)  Resp 18  Ht 5\' 2"  (1.575 m)  Wt 130 lb (58.968 kg)  BMI 23.77 kg/m2  SpO2 100% Physical Exam  Nursing note and vitals reviewed. Constitutional: She is  oriented to person, place, and time. She appears well-developed and well-nourished.  HENT:  Head: Normocephalic and atraumatic.  Eyes: Conjunctivae and EOM are normal. Pupils are equal, round, and reactive to light.  Neck: Normal range of motion and phonation normal. Neck supple.  Cardiovascular: Normal rate, regular rhythm and intact distal pulses.   Pulmonary/Chest: Effort normal and breath sounds normal. She exhibits no tenderness.  Abdominal: Soft. She exhibits no distension and no mass. There is tenderness (diffuse, moderate). There is no rebound and no guarding.  Musculoskeletal: Normal range of motion.  Neurological: She is alert and oriented to person, place, and time. She exhibits normal muscle tone.  Skin: Skin is warm and dry.  Psychiatric: She has a normal mood and affect. Her behavior is normal. Judgment and thought content normal.    ED Course  Procedures (including critical care time)  Review of data in EPIC, there is no indication that she tried to contact her gastroenterologist.   Medications  oxyCODONE-acetaminophen (PERCOCET/ROXICET) 5-325 MG per tablet 2 tablet (2 tablets Oral Given 01/16/14 1651)  ondansetron (ZOFRAN-ODT) disintegrating tablet 8 mg (8 mg Oral Given 01/16/14 1702)    Patient Vitals for the past 24 hrs:  BP Temp Temp src Pulse Resp SpO2 Height Weight  01/16/14 1554 125/79 mmHg 97.5 F (36.4 C) Oral 81 18 100 % 5\' 2"  (1.575 m) 130 lb (58.968 kg)    4:41 PM Reevaluation with update and discussion. After initial assessment and treatment, an updated evaluation reveals she feels comfortable, to go home, now. [place] Review Labs Reviewed  CBC WITH DIFFERENTIAL - Abnormal; Notable for the following:    Neutrophils Relative % 87 (*)    Lymphocytes Relative 8 (*)    All other components within normal limits  COMPREHENSIVE METABOLIC PANEL - Abnormal; Notable for the following:    Glucose, Bld 123 (*)    GFR calc non Af Amer 77 (*)     GFR calc Af Amer 90 (*)    All other components within normal limits  LIPASE, BLOOD    Imaging Review No results found.   EKG Interpretation None      MDM   Final diagnoses:  Abdominal pain    Ongoing abdominal pain, status post recent CT of the abdomen, without significant change in symptoms. She has a history of "severe" irritable bowel syndrome, according to her GI doctor. Doubt colitis, metabolic instability, serious bacterial infection, anticipated worsening condition  Nursing Notes Reviewed/ Care Coordinated Applicable Imaging Reviewed Interpretation of Laboratory Data incorporated into ED treatment  The patient appears reasonably screened and/or stabilized for discharge and I doubt any other medical condition or other [facility] requiring further screening, evaluation, or treatment in the ED at this time prior to discharge.  Plan: Home Medications- usual; Home Treatments- gradually advance diet; return here if the recommended treatment, does not improve the symptoms; Recommended follow up- PCP of choice for ongoing care, GI for endoscopy    Richarda Blade, MD 01/16/14 1800    18:40- After D/C pt requested "medicine for muscle cramping in my stomach," and stated "if you don't make my vomiting go away, I will be back here tonight. I need a Phenergan shot. I have those medicines at home." I ordered IM Phenergan, and will give RX for Bentyl.  Richarda Blade, MD 01/16/14 [phone]

## 2014-01-16 NOTE — ED Notes (Signed)
Patient requesting to speak with MD. Dr Eulis Foster aware and will be in to speak with patient.

## 2014-01-16 NOTE — Discharge Instructions (Signed)
Use the resource guide to help you find a primary care doctor to see for followup care. Try to see your GI physician, as soon as possible.    Abdominal Pain, Adult Many things can cause abdominal pain. Usually, abdominal pain is not caused by a disease and will improve without treatment. It can often be observed and treated at home. Your health care provider will do a physical exam and possibly order blood tests and X-rays to help determine the seriousness of your pain. However, in many cases, more time must pass before a clear cause of the pain can be found. Before that point, your health care provider may not know if you need more testing or further treatment. HOME CARE INSTRUCTIONS  Monitor your abdominal pain for any changes. The following actions may help to alleviate any discomfort you are experiencing:  Only take over-the-counter or prescription medicines as directed by your health care provider.  Do not take laxatives unless directed to do so by your health care provider.  Try a clear liquid diet (broth, tea, or water) as directed by your health care provider. Slowly move to a bland diet as tolerated. SEEK MEDICAL CARE IF:  You have unexplained abdominal pain.  You have abdominal pain associated with nausea or diarrhea.  You have pain when you urinate or have a bowel movement.  You experience abdominal pain that wakes you in the night.  You have abdominal pain that is worsened or improved by eating food.  You have abdominal pain that is worsened with eating fatty foods. SEEK IMMEDIATE MEDICAL CARE IF:   Your pain does not go away within 2 hours.  You have a fever.  You keep throwing up (vomiting).  Your pain is felt only in portions of the abdomen, such as the right side or the left lower portion of the abdomen.  You pass bloody or black tarry stools. MAKE SURE YOU:  Understand these instructions.   Will watch your condition.   Will get help right away if you are  not doing well or get worse.  Document Released: 06/21/2005 Document Revised: 07/02/2013 Document Reviewed: 05/21/2013 [facility] Patient Information 2014 [place].    Emergency Department Resource Guide 1) Find a Doctor and Pay Out of Pocket Although you won't have to find out who is covered by your insurance plan, it is a good idea to ask around and get recommendations. You will then need to call the office and see if the doctor you have chosen will accept you as a new patient and what types of options they offer for patients who are self-pay. Some doctors offer discounts or will set up payment plans for their patients who do not have insurance, but you will need to ask so you aren't surprised when you get to your appointment.  2) Contact Your Local Health Department Not all health departments have doctors that can see patients for sick visits, but many do, so it is worth a call to see if yours does. If you don't know where your local health department is, you can check in your phone book. The CDC also has a tool to help you locate your state's health department, and many state websites also have listings of all of their local health departments.  3) Find a [facility] If your illness is not likely to be very severe or complicated, you may want to try a walk in clinic. These are popping up all over the country in pharmacies, drugstores, and  shopping centers. They're usually staffed by nurse practitioners or physician assistants that have been trained to treat common illnesses and complaints. They're usually fairly quick and inexpensive. However, if you have serious medical issues or chronic medical problems, these are probably not your best option.  No Primary Care Doctor: - Call Health Connect at  [phone] - they can help you locate a primary care doctor that  accepts your insurance, provides certain services, etc. - Physician Referral Service- [phone]  Chronic Pain  Problems: Organization         Address  Phone   Notes  [facility]  [phone] Patients need to be referred by their primary care doctor.   Medication Assistance: Organization         Address  Phone   Notes  [facility] Medication [facility] [address] [place], [place] [address] [phone] --Must be a resident of [facility] -- Must have NO insurance coverage whatsoever (no Medicaid/ Medicare, etc.) -- The pt. MUST have a primary care doctor that directs their care regularly and follows them in the community   MedAssist  [phone]   [employer]  [phone]    Agencies that provide inexpensive medical care: Organization         Address  Phone   Notes  [place]  [phone]   [facility]    [phone]   [facility] [place], [place] [address] [phone]   [place] [address], [place] [phone]   Planned Parenthood    [phone]   [facility]    [phone]   [place] and [place] [address], [place] Phone:  [phone], Fax:  [phone] Hours of Operation:  9 am - 6 pm, M-F.  Also accepts Medicaid/Medicare and self-pay.  [facility] for [place], [address], [place] Phone: [phone], Fax: [phone]. Hours of Operation:  8:30 am - 5:30 pm, M-F.  Also accepts Medicaid and self-pay.  [facility] [address] [place] Phone: [phone]   [place], [place], [place] [phone], Ext. [phone] Mondays & Thursdays: 7-9 AM.  First 15 patients are seen on a first come, first serve basis.    [place] Providers:  Organization         Address  Phone   Notes  [facility] [address],  [phone] Also  accepts self-pay patients.  [facility] [address], [place]  [phone]   [place], [address], [place] [phone]   [facility] [address], [place] [phone]   Lucianne Lei [address], [place]   [phone] Only accepts [place] Access [place] patients after they have their name applied to their card.   Self-Pay (no insurance) in [facility]:  Organization         Address  Phone   Notes  Sickle Cell Patients, [facility] [place] [phone]   [facility] [place] [phone]   [facility] [place]  [place] [address] [place], [place] [phone]  [facility]/Dr. Osei-Bonsu  [address] [place] or [address], [place] [phone] Phone number for both [place] and [place] locations is the same.  Urgent Medical and [facility] [address] [place] [phone]   [facility] [address] [place] or [address] [phone]   [facility] [address], [place] [phone], phone; [phone], fax Sees patients 1st and 3rd Saturday of every month.  Must not qualify for public or private insurance (i.e. Medicaid, Medicare, [place] Health Choice, Veterans' Benefits)  Household income should be no more than 200% of the poverty level The clinic cannot treat you if you are pregnant or think you are pregnant  Sexually transmitted diseases are not treated at the clinic.    Dental Care: Organization         Address  Phone  Notes  [facility] Department of [facility] [place] [phone] Accepts children up to age 63 who are enrolled in [place] or [place]; pregnant  women with a Medicaid card; and children who have applied for Medicaid or [place] Health Choice, but were declined, whose parents can pay a reduced fee at time of service.  [facility] Department of [facility]  [address], [place] [phone] Accepts children up to age 22 who are enrolled in [place] or [place]; pregnant women with a Medicaid card; and children who have applied for Medicaid or [place] Health Choice, but were declined, whose parents can pay a reduced fee at time of service.  [place] Adult Dental Access PROGRAM  [place] [phone] Patients are seen by appointment only. Walk-ins are not accepted. [place] will see patients 74 years of age and older. Monday - Tuesday (8am-5pm) Most Wednesdays (8:30-5pm) $30 per visit, cash only  [facility] Adult Dental Access PROGRAM  [address], [facility] [phone] Patients are seen by appointment only. Walk-ins are not accepted. [place] will see patients 69 years of age and older. One Wednesday Evening (Monthly: Volunteer Based).  $30 per visit, cash only  [place]  [phone] for adults; Children under age 73, call Graduate Pediatric Dentistry at [phone]. Children aged 62-14, please call [phone] to request a pediatric application.  Dental services are provided in all areas of dental care including fillings, crowns and bridges, complete and partial dentures, implants, gum treatment, root canals, and extractions. Preventive care is also provided. Treatment is provided to both adults and children. Patients are selected via a lottery and there is often a waiting list.   [facility] [address] [place]  [phone] [email or web address]   [facility] [address] [place], [place] [phone], Ext. [phone] Second and Fourth Thursday of each month, opens at 6:30 AM; Clinic ends at 9 AM.  Patients are  seen on a first-come first-served basis, and a limited number are seen during each clinic.   [facility]  [address] [place] Danker [place], [place] [phone]   Eligibility Requirements You must have lived in [place], [place], or [place] counties for at least the last three months.   You cannot be eligible for state or federal sponsored [employer], including [employer], [place], or [employer].   You generally cannot be eligible for healthcare insurance  through your employer.    How to apply: Eligibility screenings are held every Tuesday and Wednesday afternoon from 1:00 pm until 4:00 pm. You do not need an appointment for the interview!  [facility] [address] [place], [place]   [place]  [place] Department  [place]  [phone]    Behavioral Health Resources in the Community: Intensive Outpatient Programs Organization         Address  Phone  Notes  [place]. [address], [place], [place] [phone]   [facility] [address] [place], [place]   ADS: Alcohol & Drug Svcs [address] [place], [place]   [place] [address]  [place], [place] or [phone]   Substance Abuse Resources Organization         Address  Phone  Notes  Alcohol and Drug Services  [phone]   [place]  [phone]   [facility] [place]   Chinita Pester  [phone]   Residential & Outpatient Substance Abuse Program  [phone]   Psychological Services Organization         Address  Phone  Notes  [facility] [place]  [place]  [phone]   [place] [address], [place] or [phone]    Mobile Crisis  Teams Organization         Address  Phone  Notes  [facility], Mobile Crisis Care Unit  [phone]   [facility] Services  [address] [place], [place]   [place] [address] [place] [phone]    Self-Help/Support Groups Organization         Address  Phone             Notes  [place]. of [place] - variety of support groups  [place] Call for more information  Narcotics Anonymous (NA), [facility] [address], [employer] [place]  2 meetings at this location   Special educational needs teacher         Address  Phone  Notes  [facility] Residential Treatment [place],    [place]  [phone]   [facility]  [address] [place] [phone], [place], [place]   [place], [place] [phone] Admissions: 8am-3pm M-F  Incentives Substance [place] [address],    [place], [place] [phone]   [facility] [address] [place], [place], [place]   [facility] [address],  [place], [place]   [facility] - Intensive Outpatient [address], [place], [place]   [facility] ([place].,  [place], [place] [phone] or [phone]   [facility] (RTS) [address], [place], [place] Accepts Medicaid  [facility] [place] [address]  [place] [phone] Substance Abuse/Addiction Treatment   [facility] Organization         Address  Phone  Notes  [facility]  [phone]   Domenic Schwab, PhD [address] [place], [place]   [phone] or [phone]   [place], [place] [phone]  [facility] [address], [place], [place] [phone]  Insurance/Medicaid/sponsorship through [facility]  °[facility] [address]                                    [place], [place] [phone] Therapy/tele-psych/case  °[facility] [address]  ° [place], [place] [phone]    °Dr. Arfeen  [phone]   °[facility] [place]  [employer] [place] [facility] 1) [address], [place] °2) [address], [place] °3)  [address], [place] [phone] °([phone] ° °([phone]   °[place] Child Abuse Hotline [phone]) [phone] or ([phone] (After Hours)    ° ° ° ° °

## 2014-01-19 ENCOUNTER — Telehealth: Payer: Self-pay | Admitting: Internal Medicine

## 2014-01-19 NOTE — Telephone Encounter (Signed)
Patient was scheduled in January for colonoscopy and upper endoscopy she cancelled the appt for a variety of reasons x 2.  She has 2 recent ER visits with elevated LFTs.  She says that she ws told she can't reschedule the procedures until LFTs are normal.  I do not see any record of that.  Is it ok to reschedule directly?

## 2014-01-19 NOTE — Telephone Encounter (Signed)
Patient scheduled for pre-visit for Thursday at 10:00 and colon/endo on 5/8 for 9:30

## 2014-01-19 NOTE — Telephone Encounter (Signed)
I did not tell her that - she may reschedule

## 2014-01-27 ENCOUNTER — Ambulatory Visit (AMBULATORY_SURGERY_CENTER): Payer: Self-pay

## 2014-01-27 VITALS — Ht 62.0 in | Wt 132.4 lb

## 2014-01-27 DIAGNOSIS — R1013 Epigastric pain: Secondary | ICD-10-CM

## 2014-01-27 DIAGNOSIS — G8929 Other chronic pain: Secondary | ICD-10-CM

## 2014-01-27 DIAGNOSIS — K5900 Constipation, unspecified: Secondary | ICD-10-CM

## 2014-01-27 MED ORDER — NA SULFATE-K SULFATE-MG SULF 17.5-3.13-1.6 GM/177ML PO SOLN: 1.0000 | Freq: Once | ORAL | Status: DC

## 2014-01-27 NOTE — Progress Notes (Signed)
PER PATIENT-"HARD TO SEDATE!"                                                                                                                                                                                                                                                                                                                                                                                                                                                                                                           Pt is not allergic to soy,or eggs.She is not taking any weight loss meds or on any O2.

## 2014-01-30 ENCOUNTER — Encounter: Payer: Self-pay | Admitting: Internal Medicine

## 2014-01-30 ENCOUNTER — Ambulatory Visit (AMBULATORY_SURGERY_CENTER): Payer: BC Managed Care – PPO | Admitting: Internal Medicine

## 2014-01-30 ENCOUNTER — Other Ambulatory Visit: Payer: Self-pay

## 2014-01-30 VITALS — BP 121/72 | HR 71 | Temp 95.3°F | Resp 14 | Ht 62.0 in | Wt 132.0 lb

## 2014-01-30 DIAGNOSIS — R1013 Epigastric pain: Principal | ICD-10-CM

## 2014-01-30 DIAGNOSIS — K2990 Gastroduodenitis, unspecified, without bleeding: Secondary | ICD-10-CM

## 2014-01-30 DIAGNOSIS — Z1211 Encounter for screening for malignant neoplasm of colon: Secondary | ICD-10-CM

## 2014-01-30 DIAGNOSIS — G8929 Other chronic pain: Secondary | ICD-10-CM

## 2014-01-30 DIAGNOSIS — R1011 Right upper quadrant pain: Secondary | ICD-10-CM

## 2014-01-30 DIAGNOSIS — K2970 Gastritis, unspecified, without bleeding: Secondary | ICD-10-CM

## 2014-01-30 DIAGNOSIS — K2940 Chronic atrophic gastritis without bleeding: Secondary | ICD-10-CM

## 2014-01-30 MED ORDER — SODIUM CHLORIDE 0.9 % IV SOLN: 500.0000 mL | INTRAVENOUS | Status: DC

## 2014-01-30 NOTE — Patient Instructions (Addendum)
The esophagus and stomach showed changes in the lining that could represent inflammation so i took biopsies. The colonoscopy was normal. Next routine colonoscopy in 10 years - 2025  I think you should have another gallbladder US - my office will order this.  We will call with results and recommendations.  I appreciate the opportunity to care for you. Gatha Mayer, MD, [facility]   Discharge instructions given with verbal understanding. Handouts on gastritis. Office will schedule RUQ US-limited-evaluate gallbladder and RUQ pain. Resume previous medications. YOU HAD AN ENDOSCOPIC PROCEDURE TODAY AT [facility]: Refer to the procedure report that was given to you for any specific questions about what was found during the examination.  If the procedure report does not answer your questions, please call your gastroenterologist to clarify.  If you requested that your care partner not be given the details of your procedure findings, then the procedure report has been included in a sealed envelope for you to review at your convenience later.  YOU SHOULD EXPECT: Some feelings of bloating in the abdomen. Passage of more gas than usual.  Walking can help get rid of the air that was put into your GI tract during the procedure and reduce the bloating. If you had a lower endoscopy (such as a colonoscopy or flexible sigmoidoscopy) you may notice spotting of blood in your stool or on the toilet paper. If you underwent a bowel prep for your procedure, then you may not have a normal bowel movement for a few days.  DIET: Your first meal following the procedure should be a light meal and then it is ok to progress to your normal diet.  A half-sandwich or bowl of soup is an example of a good first meal.  Heavy or fried foods are harder to digest and may make you feel nauseous or bloated.  Likewise meals heavy in dairy and vegetables can cause extra gas to form and this can also increase the bloating.   Drink plenty of fluids but you should avoid alcoholic beverages for 24 hours.  ACTIVITY: Your care partner should take you home directly after the procedure.  You should plan to take it easy, moving slowly for the rest of the day.  You can resume normal activity the day after the procedure however you should NOT DRIVE or use heavy machinery for 24 hours (because of the sedation medicines used during the test).    SYMPTOMS TO REPORT IMMEDIATELY: A gastroenterologist can be reached at any hour.  During normal business hours, 8:30 AM to 5:00 PM Monday through Friday, call [phone].  After hours and on weekends, please call the GI answering service at [phone] who will take a message and have the physician on call contact you.   Following lower endoscopy (colonoscopy or flexible sigmoidoscopy):  Excessive amounts of blood in the stool  Significant tenderness or worsening of abdominal pains  Swelling of the abdomen that is new, acute  Fever of 100F or higher  Following upper endoscopy (EGD)  Vomiting of blood or coffee ground material  New chest pain or pain under the shoulder blades  Painful or persistently difficult swallowing  New shortness of breath  Fever of 100F or higher  Black, tarry-looking stools  FOLLOW UP: If any biopsies were taken you will be contacted by phone or by letter within the next 1-3 weeks.  Call your gastroenterologist if you have not heard about the biopsies in 3 weeks.  Our staff will call  the home number listed on your records the next business day following your procedure to check on you and address any questions or concerns that you may have at that time regarding the information given to you following your procedure. This is a courtesy call and so if there is no answer at the home number and we have not heard from you through the emergency physician on call, we will assume that you have returned to your regular daily activities without  incident.  SIGNATURES/CONFIDENTIALITY: You and/or your care partner have signed paperwork which will be entered into your electronic medical record.  These signatures attest to the fact that that the information above on your After Visit Summary has been reviewed and is understood.  Full responsibility of the confidentiality of this discharge information lies with you and/or your care-partner.

## 2014-01-30 NOTE — Op Note (Signed)
[name]  [address] [place], [id number]   COLONOSCOPY PROCEDURE REPORT  PATIENT: [name] [name], [name] [name]  MR#: [id number] BIRTHDATE: 1956-02-28 , 81  yrs. old GENDER: Female ENDOSCOPIST: Gatha Mayer, MD, [facility] PROCEDURE DATE:  01/30/2014 PROCEDURE:   Colonoscopy, screening First Screening Colonoscopy - Avg.  risk and is 50 yrs.  old or older Yes.  Prior Negative Screening - Now for repeat screening. N/A  History of Adenoma - Now for follow-up colonoscopy & has been > or = to 3 yrs.  N/A  Polyps Removed Today? No.  Recommend repeat exam, <10 yrs? No. ASA CLASS:   Class II INDICATIONS:average risk screening and first colonoscopy. MEDICATIONS: There was residual sedation effect present from prior procedure, propofol (Diprivan) 200mg  IV, MAC sedation, administered by CRNA, and These medications were titrated to patient response per physician's verbal order  DESCRIPTION OF PROCEDURE:   After the risks benefits and alternatives of the procedure were thoroughly explained, informed consent was obtained.  A digital rectal exam revealed no abnormalities of the rectum.   The LB [id number]  endoscope was introduced through the anus and advanced to the terminal ileum which was intubated for a short distance. No adverse events experienced.   The quality of the prep was Suprep good  The instrument was then slowly withdrawn as the colon was fully examined.      COLON FINDINGS: The mucosa appeared normal in the terminal ileum. A normal appearing cecum, ileocecal valve, and appendiceal orifice were identified.  The ascending, hepatic flexure, transverse, splenic flexure, descending, sigmoid colon and rectum appeared unremarkable.  No polyps or cancers were seen.   A right colon retroflexion was performed.  Retroflexed views revealed no abnormalities. The time to cecum=2 minutes 30 seconds.  Withdrawal time=6 minutes 10 seconds.  The scope was withdrawn and  the procedure completed. COMPLICATIONS: There were no complications.  ENDOSCOPIC IMPRESSION: 1.   Normal mucosa in the terminal ileum 2.   Normal colon - good prep  RECOMMENDATIONS: Repeat Colonscopy in 10 years. Schedule Limited RUQ [place] - evaluate gallbladder dx: epigastic pain and RUQ pain   eSigned:  Gatha Mayer, MD, [facility] 01/30/2014 10:29 AM   cc: The Patient

## 2014-01-30 NOTE — Progress Notes (Signed)
Patient in recovery room moaning and groaning after procedure. Dr. Carlean Purl assess patient. Abdomin soft and nondistended. levsin given to aide in expelling of air. Moderate amount of air expelled. Will continue to monitor.

## 2014-01-30 NOTE — Progress Notes (Signed)
Dr Carlean Purl reassessed pt in recovery, abd soft non distended, pt states she is felling better, pt is back to same state as when admitting, pt was okay upon discharge-CM

## 2014-01-30 NOTE — Progress Notes (Signed)
Called to room to assist during endoscopic procedure.  Patient ID and intended procedure confirmed with present staff. Received instructions for my participation in the procedure from the performing physician.  

## 2014-01-30 NOTE — Op Note (Signed)
[facility]  [address]. [place], [id number]   ENDOSCOPY PROCEDURE REPORT  PATIENT: [name], [name]  MR#: [id number] BIRTHDATE: 01-19-1956 , 46  yrs. old GENDER: Female ENDOSCOPIST: Gatha Mayer, MD, [facility] PROCEDURE DATE:  01/30/2014 PROCEDURE:  EGD w/ biopsy ASA CLASS:     Class II INDICATIONS:  Epigastric pain.   abdominal pain in the upper right quadrant.   Vomiting.   Nausea. MEDICATIONS: propofol (Diprivan) 200mg  IV, MAC sedation, administered by CRNA, and These medications were titrated to patient response per physician's verbal order TOPICAL ANESTHETIC: Cetacaine Spray  DESCRIPTION OF PROCEDURE: After the risks benefits and alternatives of the procedure were thoroughly explained, informed consent was obtained.  The LB [id number] endoscope was introduced through the mouth and advanced to the second portion of the duodenum. Without limitations.  The instrument was slowly withdrawn as the mucosa was fully examined.        STOMACH: Moderate non-erosive gastritis (inflammation) was found in the gastric body and gastric antrum.  Multiple biopsies were performed using cold forceps.  Sample sent for histology.  ESOPHAGUS: Abnormal mucosa was found in the entire esophagus. Fissured mucosa.  Multiple biopsies were performed using cold forceps.  Sample sent for histology.  The remainder of the upper endoscopy exam was otherwise normal. Retroflexed views revealed no abnormalities.     The scope was then withdrawn from the patient and the procedure completed.  COMPLICATIONS: There were no complications. ENDOSCOPIC IMPRESSION: 1.   Non-erosive gastritis (inflammation) was found in the gastric body and gastric antrum; multiple biopsies 2.   Abnormal mucosa was found in the entire esophagus; multiple biopsies 3.   The remainder of the upper endoscopy exam was otherwise normal  RECOMMENDATIONS: 1.  Await biopsy results, schedule RUQ [place] - limited  - evaluate gallbladder - epigastric and RUQ pain 2.  Proceed with a Colonoscopy.    eSigned:  Gatha Mayer, MD, [facility] 01/30/2014 10:33 AM   CC:The Patient

## 2014-02-02 ENCOUNTER — Telehealth: Payer: Self-pay | Admitting: *Deleted

## 2014-02-02 MED ORDER — ONDANSETRON HCL 8 MG PO TABS: 8.0000 mg | ORAL_TABLET | ORAL | Status: DC | PRN

## 2014-02-02 MED ORDER — DICYCLOMINE HCL 20 MG PO TABS: 20.0000 mg | ORAL_TABLET | Freq: Three times a day (TID) | ORAL | Status: DC | PRN

## 2014-02-02 NOTE — Telephone Encounter (Signed)
OK We (office) will refill the dicyclomine and also rx ondansetron ODT 8 mg 1 every 4 hrs as needed # 30 1 refill

## 2014-02-02 NOTE — Telephone Encounter (Signed)
  Follow up Call-  Call back number 01/30/2014  Post procedure Call Back phone  # [phone]  Permission to leave phone message Yes     Patient questions:  Do you have a fever, pain , or abdominal swelling? no Pain Score  0 *  Have you tolerated food without any problems? no  Have you been able to return to your normal activities? no  Do you have any questions about your discharge instructions: Diet   no Medications  yes Follow up visit  no  Do you have questions or concerns about your Care? no  Actions: * If pain score is 4 or above: Physician/ provider Notified : Silvano Rusk, MD  Patient states she still feels poorly but these are the same complaints she has had precipitating her appointment and procedure. She states she needs a refill on Bentyl and the medication she places under her tongue to help with nausea. Please advise Dr.Gessner.

## 2014-02-02 NOTE — Telephone Encounter (Signed)
Spoke to patient and confirmed pharmacy, refills sent in as Dr. Carlean Purl instructed.

## 2014-02-05 ENCOUNTER — Other Ambulatory Visit: Payer: Self-pay

## 2014-02-05 ENCOUNTER — Ambulatory Visit (HOSPITAL_COMMUNITY)
Admission: RE | Admit: 2014-02-05 | Discharge: 2014-02-05 | Disposition: A | Discharge status: Home / Self Care | Payer: BC Managed Care – PPO | Source: Ambulatory Visit | Attending: Internal Medicine | Admitting: Internal Medicine

## 2014-02-05 DIAGNOSIS — R1011 Right upper quadrant pain: Secondary | ICD-10-CM | POA: Insufficient documentation

## 2014-02-05 DIAGNOSIS — K828 Other specified diseases of gallbladder: Secondary | ICD-10-CM | POA: Insufficient documentation

## 2014-02-05 DIAGNOSIS — R1013 Epigastric pain: Secondary | ICD-10-CM

## 2014-02-05 NOTE — Progress Notes (Signed)
Quick Note:  Biopsies show inflammation but doubt this is causing her pain. She has some sludge on gallbladder US but does not have stones so cause of pain not seen there but sometimes it could still be coming from gallbladder but she has been under so much stress that may be influencing and affecting her.  We can have her see a surgeon about considering gallbladder removal if she wants but cannot guarantee this will relieve her pain. Let me know ______

## 2014-02-13 ENCOUNTER — Encounter (HOSPITAL_COMMUNITY): Payer: Self-pay | Admitting: Pharmacy Technician

## 2014-02-13 ENCOUNTER — Other Ambulatory Visit (HOSPITAL_COMMUNITY): Payer: Self-pay | Admitting: Anesthesiology

## 2014-02-13 ENCOUNTER — Ambulatory Visit (INDEPENDENT_AMBULATORY_CARE_PROVIDER_SITE_OTHER): Payer: BC Managed Care – PPO | Admitting: Surgery

## 2014-02-13 ENCOUNTER — Encounter (INDEPENDENT_AMBULATORY_CARE_PROVIDER_SITE_OTHER): Payer: Self-pay | Admitting: Surgery

## 2014-02-13 VITALS — BP 128/82 | HR 65 | Temp 97.8°F | Ht 62.0 in | Wt 126.0 lb

## 2014-02-13 DIAGNOSIS — K811 Chronic cholecystitis: Secondary | ICD-10-CM | POA: Insufficient documentation

## 2014-02-13 NOTE — Progress Notes (Signed)
Patient ID: [name] [name], female   DOB: 1956/09/12, 57 y.o.   MRN: [id number]  Chief Complaint  Patient presents with  . eval gallbladder    HPI [name] [name] is a 58 y.o. female.   HPI [name] [name] is referred by Dr. Carlean Purl for evaluation of chronic abdominal pain.  [name] [name] has had pain in her abdomen about 15 years.  Mostly recently, the pain is constant and in the epigastrium.  It is sharp in nature.  [name] [name] has chronic constipation.  Interestingly, [name] [name] has had an abnormal HIDA scan in 2004 with decreased EF which was then normal in 2008.  Dr. Carlean Purl has done an extensive workup including endoscopy. Past Medical History  Diagnosis Date  . UTI 10/06/2010  . ROTATOR CUFF REPAIR, HX OF 09/11/2007  . APPENDECTOMY, HX OF 09/11/2007  . TAH/BSO, HX OF 09/11/2007  . HX, PERSONAL, MUSCULOSKELETAL DISORD NEC 07/16/2007  . HX, PERSONAL, URINARY CALCULI 07/16/2007  . HIATAL HERNIA, HX OF 09/11/2007  . FX, RAMUS NOS, CLOSED 07/16/2007  . ABDOMINAL PAIN, LOWER 02/16/2009  . SYMPTOM, PAIN, ABDOMINAL, RIGHT UP QUADRANT 07/16/2007  . Dysuria 02/20/2008  . CHEST PAIN, ACUTE 09/03/2008  . OSTEOPENIA 10/23/2007  . BACK PAIN 07/16/2007    chronic  . CERVICAL RADICULOPATHY, RIGHT 07/16/2007  . OSTEOARTHRITIS 09/11/2007  . VAGINITIS, ATROPHIC, POSTMENOPAUSAL 07/16/2007  . Irritable bowel syndrome 09/11/2007  . HEMORRHOIDS 09/11/2007  . Chronic pain syndrome 09/11/2007  . ATTENTION DEFICIT DISORDER, ADULT 02/02/2009  . DEPRESSION/ANXIETY 06/17/2010  . NEOPLASM, SKIN, UNCERTAIN BEHAVIOR 10/02/8414  . Suicidal ideations   . Chronic abdominal pain   . Functional GI symptoms   . Fibromyalgia   . H/O failed conscious sedation     PT STATES [name] [name] IS HARD TO SEDATE!    Past Surgical History  Procedure Laterality Date  . Appendectomy    . Rotator cuff repair      right  . Total abdominal hysterectomy w/ bilateral salpingoophorectomy    . Cervical radiculopathy      RT  . Esophagogastroduodenoscopy    .  Colonoscopy    . Tens unit      lower back    Family History  Problem Relation Age of Onset  . Heart disease Mother     CAD/ valvular disease  . Kidney disease Mother     lost a kidney -  unknown cause  . Heart disease Brother     CAD  . Cancer Other     Colon  . COPD Father   . Heart disease Father 73    MI  . Heart disease Sister   . Heart disease Maternal Aunt   . Heart disease Maternal Grandmother   . Colon cancer Cousin     Social History History  Substance Use Topics  . Smoking status: Never Smoker   . Smokeless tobacco: Never Used  . Alcohol Use: No    Allergies  Allergen Reactions  . Ciprofloxacin Itching and Palpitations  . Atorvastatin   . Buspirone Hcl     REACTION: intolerance  . Ezetimibe-Simvastatin   . Latex     Redness and burning  . Lyrica [Pregabalin]     Caused swelling in legs and hands  . Penicillins   . Sulfa Antibiotics Nausea Only    Current Outpatient Prescriptions  Medication Sig Dispense Refill  . ALPRAZolam (XANAX) 1 MG tablet Take 1 mg by mouth 4 (four) times daily.       Marland Kitchen BRINTELLIX 20 MG TABS  Take 20 mg by mouth as needed.       . celecoxib (CELEBREX) 200 MG capsule Take 200 mg by mouth daily as needed for mild pain or moderate pain.       Marland Kitchen dicyclomine (BENTYL) 20 MG tablet Take 1 tablet (20 mg total) by mouth 3 (three) times daily as needed for spasms.  30 tablet  0  . Docusate Sodium (DULCOLAX STOOL SOFTENER PO) Take by mouth daily.      . fentaNYL (DURAGESIC - DOSED MCG/HR) 75 MCG/HR Place 150 mcg onto the skin every other day.       Marland Kitchen glycopyrrolate (ROBINUL) 2 MG tablet Take 1 tablet (2 mg total) by mouth 2 (two) times daily.  60 tablet  3  . methocarbamol (ROBAXIN) 750 MG tablet Take 750 mg by mouth every 8 (eight) hours as needed for muscle spasms.       Marland Kitchen omeprazole (PRILOSEC) 20 MG capsule Take 1 capsule (20 mg total) by mouth 2 (two) times daily.  60 capsule  2  . ondansetron (ZOFRAN) 8 MG tablet Take 1 tablet (8 mg  total) by mouth every 4 (four) hours as needed for nausea or vomiting.  30 tablet  1  . oxyCODONE (OXYCONTIN) 15 MG TB12 Take 15 mg by mouth every 6 (six) hours as needed (for pain).       . polyethylene glycol powder (GLYCOLAX/MIRALAX) powder Take 17 g by mouth daily as needed for mild constipation or moderate constipation.      . promethazine (PROMETHEGAN) 25 MG suppository Place 1 suppository (25 mg total) rectally every 6 (six) hours as needed for nausea (place 1 suppository every 6 hrs as needed for nausea).  12 each  5  . traZODone (DESYREL) 50 MG tablet Take 150 mg by mouth at bedtime. Can take up to 3 pills      . venlafaxine XR (EFFEXOR-XR) 150 MG 24 hr capsule Take 300 mg by mouth daily.       No current facility-administered medications for this visit.    Review of Systems Review of Systems  Constitutional: Negative for fever, chills and unexpected weight change.  HENT: Negative for congestion, hearing loss, sore throat, trouble swallowing and voice change.   Eyes: Negative for visual disturbance.  Respiratory: Negative for cough and wheezing.   Cardiovascular: Negative for chest pain, palpitations and leg swelling.  Gastrointestinal: Positive for nausea, vomiting, abdominal pain and abdominal distention. Negative for diarrhea, constipation, blood in stool and anal bleeding.  Genitourinary: Negative for hematuria, vaginal bleeding and difficulty urinating.  Musculoskeletal: Negative for arthralgias.  Skin: Negative for rash and wound.  Neurological: Negative for seizures, syncope and headaches.  Hematological: Negative for adenopathy. Does not bruise/bleed easily.  Psychiatric/Behavioral: Negative for confusion.    Blood pressure 128/82, pulse 65, temperature 97.8 F (36.6 C), height 5\' 2"  (1.575 m), weight 126 lb (57.153 kg).  Physical Exam Physical Exam  Constitutional: [name] [name] is oriented to person, place, and time. [name] [name] appears well-developed and well-nourished. No distress.   HENT:  Head: Normocephalic and atraumatic.  Right Ear: External ear normal.  Left Ear: External ear normal.  Nose: Nose normal.  Mouth/Throat: Oropharynx is clear and moist. No oropharyngeal exudate.  Eyes: Conjunctivae are normal. Pupils are equal, round, and reactive to light. Right eye exhibits no discharge. Left eye exhibits no discharge. No scleral icterus.  Neck: Normal range of motion. Neck supple. No tracheal deviation present.  Cardiovascular: Normal rate, regular rhythm, normal heart sounds and  intact distal pulses.   No murmur heard. Pulmonary/Chest: Effort normal and breath sounds normal.  Abdominal: Soft. Bowel sounds are normal. There is tenderness. There is guarding.  Tenderness and guarding in the epigastrium and RUQ  Musculoskeletal: Normal range of motion. [name] [name] exhibits no edema and no tenderness.  Lymphadenopathy:    [name] [name] has no cervical adenopathy.  Neurological: [name] [name] is alert and oriented to person, place, and time.  Skin: Skin is warm and dry. No rash noted. [name] [name] is not diaphoretic. No erythema.  Psychiatric: Her behavior is normal.    Data Reviewed U/S shows thick gallbladder sludge.  CT abd/pelvis is normal.  HIDA shows EF of 29%  Assessment    Chronic abdominal pain with gallbladder sludge and suspected chronic cholecystitis     Plan    Given her symptoms and xray studies, I suspect her gallbladder disease is causing some of her abdominal complaints.  I recommend a lap chole. I discussed the procedure in detail.  The patient was given Neurosurgeon.  We discussed the risks and benefits of a laparoscopic cholecystectomy and possible cholangiogram including, but not limited to bleeding, infection, injury to surrounding structures such as the intestine or liver, bile leak, retained gallstones, need to convert to an open procedure, prolonged diarrhea, blood clots such as  DVT, common bile duct injury, anesthesia risks, and possible need for additional  procedures.  We discussed the chance that this might not improve her symptoms. We discussed the typical post-operative recovery course.        Harl Bowie 02/13/2014, 10:03 AM

## 2014-02-13 NOTE — Progress Notes (Signed)
Chest xray 2 view 11-25-13 epic ekg 3-3-115 epic

## 2014-02-13 NOTE — Patient Instructions (Addendum)
[name]  02/13/2014   Your procedure is scheduled on: Wednesday May 27th, 2015  Report to [facility] Main Entrance and follow signs to  [place] at  915AM.  Call this number if you have problems the morning of surgery [phone]   Remember:  Do not eat food or drink liquids :After Midnight.     Take these medicines the morning of surgery with A SIP OF WATER: xanax if needed, omeprazole, oxycodone if needed, zofran if needed                               You may not have any metal on your body including hair pins and piercings  Do not wear jewelry, make-up, lotions, powders, or deodorant.   Men may shave face and neck.  Do not bring valuables to the hospital. [place].  Contacts, dentures or bridgework may not be worn into surgery.  Leave suitcase in the car. After surgery it may be brought to your room.  For patients admitted to the hospital, checkout time is 11:00 AM the day of discharge.   Patients discharged the day of surgery will not be allowed to drive home.  Name and phone number of your driver: friend [name] [name] cell [phone]  ________________________________________________________________________  [facility] - Preparing for Surgery Before surgery, you can play an important role.  Because skin is not sterile, your skin needs to be as free of germs as possible.  You can reduce the number of germs on your skin by washing with CHG (chlorahexidine gluconate) soap before surgery.  CHG is an antiseptic cleaner which kills germs and bonds with the skin to continue killing germs even after washing. Please DO NOT use if you have an allergy to CHG or antibacterial soaps.  If your skin becomes reddened/irritated stop using the CHG and inform your nurse when you arrive at Short Stay. Do not shave (including legs and underarms) for at least 48 hours prior to the first CHG shower.  You may shave your face/neck. Please  follow these instructions carefully:  1.  Shower with CHG Soap the night before surgery and the  morning of Surgery.  2.  If you choose to wash your hair, wash your hair first as usual with your  normal  shampoo.  3.  After you shampoo, rinse your hair and body thoroughly to remove the  shampoo.                           4.  Use CHG as you would any other liquid soap.  You can apply chg directly  to the skin and wash                       Gently with a scrungie or clean washcloth.  5.  Apply the CHG Soap to your body ONLY FROM THE NECK DOWN.   Do not use on face/ open                           Wound or open sores. Avoid contact with eyes, ears mouth and genitals (private parts).                       Wash face,  Genitals (private parts) with  your normal soap.             6.  Wash thoroughly, paying special attention to the area where your surgery  will be performed.  7.  Thoroughly rinse your body with warm water from the neck down.  8.  DO NOT shower/wash with your normal soap after using and rinsing off  the CHG Soap.                9.  Pat yourself dry with a clean towel.            10.  Wear clean pajamas.            11.  Place clean sheets on your bed the night of your first shower and do not  sleep with pets. Day of Surgery : Do not apply any lotions/deodorants the morning of surgery.  Please wear clean clothes to the hospital/surgery center.  FAILURE TO FOLLOW THESE INSTRUCTIONS MAY RESULT IN THE CANCELLATION OF YOUR SURGERY PATIENT SIGNATURE_________________________________  NURSE SIGNATURE__________________________________  ________________________________________________________________________

## 2014-02-17 ENCOUNTER — Encounter (HOSPITAL_COMMUNITY)
Admission: RE | Admit: 2014-02-17 | Discharge: 2014-02-17 | Disposition: A | Discharge status: Home / Self Care | Payer: BC Managed Care – PPO | Source: Ambulatory Visit | Attending: Surgery | Admitting: Surgery

## 2014-02-17 ENCOUNTER — Encounter (HOSPITAL_COMMUNITY): Payer: Self-pay

## 2014-02-17 HISTORY — DX: Adverse effect of unspecified anesthetic, initial encounter: T41.45XA

## 2014-02-17 HISTORY — DX: Gastro-esophageal reflux disease without esophagitis: K21.9

## 2014-02-17 HISTORY — DX: Family history of other specified conditions: Z84.89

## 2014-02-17 HISTORY — DX: Other complications of anesthesia, initial encounter: T88.59XA

## 2014-02-17 HISTORY — DX: Abnormal levels of other serum enzymes: R74.8

## 2014-02-17 HISTORY — DX: Other specified postprocedural states: R11.2

## 2014-02-17 HISTORY — DX: Other specified postprocedural states: Z98.890

## 2014-02-17 LAB — CBC
HEMATOCRIT: 49.6 % — AB (ref 36.0–46.0)
Hemoglobin: 17.2 g/dL — ABNORMAL HIGH (ref 12.0–15.0)
MCH: 32.3 pg (ref 26.0–34.0)
MCHC: 34.7 g/dL (ref 30.0–36.0)
MCV: 93.1 fL (ref 78.0–100.0)
Platelets: 340 10*3/uL (ref 150–400)
RBC: 5.33 MIL/uL — ABNORMAL HIGH (ref 3.87–5.11)
RDW: 12 % (ref 11.5–15.5)
WBC: 9.6 10*3/uL (ref 4.0–10.5)

## 2014-02-17 LAB — COMPREHENSIVE METABOLIC PANEL
ALBUMIN: 4.4 g/dL (ref 3.5–5.2)
ALT: 82 U/L — ABNORMAL HIGH (ref 0–35)
AST: 136 U/L — ABNORMAL HIGH (ref 0–37)
Alkaline Phosphatase: 123 U/L — ABNORMAL HIGH (ref 39–117)
BILIRUBIN TOTAL: 0.6 mg/dL (ref 0.3–1.2)
BUN: 12 mg/dL (ref 6–23)
CHLORIDE: 96 meq/L (ref 96–112)
CO2: 26 mEq/L (ref 19–32)
CREATININE: 0.96 mg/dL (ref 0.50–1.10)
Calcium: 9.7 mg/dL (ref 8.4–10.5)
GFR calc Af Amer: 74 mL/min — ABNORMAL LOW (ref >90)
GFR calc non Af Amer: 64 mL/min — ABNORMAL LOW (ref >90)
Glucose, Bld: 97 mg/dL (ref 70–99)
Potassium: 4.5 mEq/L (ref 3.7–5.3)
Sodium: 138 mEq/L (ref 137–147)
Total Protein: 8 g/dL (ref 6.0–8.3)

## 2014-02-18 ENCOUNTER — Encounter (HOSPITAL_COMMUNITY): Payer: BC Managed Care – PPO | Admitting: Certified Registered Nurse Anesthetist

## 2014-02-18 ENCOUNTER — Ambulatory Visit (HOSPITAL_COMMUNITY): Payer: BC Managed Care – PPO | Admitting: Certified Registered Nurse Anesthetist

## 2014-02-18 ENCOUNTER — Encounter (HOSPITAL_COMMUNITY): Payer: Self-pay | Admitting: *Deleted

## 2014-02-18 ENCOUNTER — Ambulatory Visit (INDEPENDENT_AMBULATORY_CARE_PROVIDER_SITE_OTHER): Payer: BC Managed Care – PPO | Admitting: General Surgery

## 2014-02-18 ENCOUNTER — Encounter (HOSPITAL_COMMUNITY)
Admission: RE | Disposition: A | Discharge status: Home / Self Care | Payer: Self-pay | Source: Ambulatory Visit | Attending: Surgery

## 2014-02-18 ENCOUNTER — Observation Stay (HOSPITAL_COMMUNITY)
Admission: RE | Admit: 2014-02-18 | Discharge: 2014-02-19 | Disposition: A | Discharge status: Home / Self Care | Payer: BC Managed Care – PPO | Source: Ambulatory Visit | Attending: Surgery | Admitting: Surgery

## 2014-02-18 DIAGNOSIS — G8929 Other chronic pain: Secondary | ICD-10-CM | POA: Insufficient documentation

## 2014-02-18 DIAGNOSIS — K219 Gastro-esophageal reflux disease without esophagitis: Secondary | ICD-10-CM | POA: Insufficient documentation

## 2014-02-18 DIAGNOSIS — K811 Chronic cholecystitis: Principal | ICD-10-CM | POA: Insufficient documentation

## 2014-02-18 DIAGNOSIS — IMO0001 Reserved for inherently not codable concepts without codable children: Secondary | ICD-10-CM | POA: Insufficient documentation

## 2014-02-18 DIAGNOSIS — F329 Major depressive disorder, single episode, unspecified: Secondary | ICD-10-CM | POA: Insufficient documentation

## 2014-02-18 DIAGNOSIS — K5900 Constipation, unspecified: Secondary | ICD-10-CM | POA: Insufficient documentation

## 2014-02-18 DIAGNOSIS — Z79899 Other long term (current) drug therapy: Secondary | ICD-10-CM | POA: Insufficient documentation

## 2014-02-18 DIAGNOSIS — Z9049 Acquired absence of other specified parts of digestive tract: Secondary | ICD-10-CM

## 2014-02-18 DIAGNOSIS — Z9089 Acquired absence of other organs: Secondary | ICD-10-CM | POA: Insufficient documentation

## 2014-02-18 DIAGNOSIS — R1013 Epigastric pain: Secondary | ICD-10-CM | POA: Insufficient documentation

## 2014-02-18 DIAGNOSIS — F3289 Other specified depressive episodes: Secondary | ICD-10-CM | POA: Insufficient documentation

## 2014-02-18 HISTORY — PX: CHOLECYSTECTOMY: SHX55

## 2014-02-18 SURGERY — LAPAROSCOPIC CHOLECYSTECTOMY
Anesthesia: General | Site: Abdomen

## 2014-02-18 MED ORDER — DEXAMETHASONE SODIUM PHOSPHATE 10 MG/ML IJ SOLN
INTRAMUSCULAR | Status: AC
  Filled 2014-02-18: qty 1

## 2014-02-18 MED ORDER — LACTATED RINGERS IR SOLN
Status: DC | PRN
  Administered 2014-02-18: 1000 mL

## 2014-02-18 MED ORDER — MIDAZOLAM HCL 5 MG/5ML IJ SOLN
INTRAMUSCULAR | Status: DC | PRN
  Administered 2014-02-18 (×2): 1 mg via INTRAVENOUS

## 2014-02-18 MED ORDER — SCOPOLAMINE 1 MG/3DAYS TD PT72
MEDICATED_PATCH | TRANSDERMAL | Status: DC | PRN
  Administered 2014-02-18: 1 via TRANSDERMAL

## 2014-02-18 MED ORDER — SUCCINYLCHOLINE CHLORIDE 20 MG/ML IJ SOLN
INTRAMUSCULAR | Status: DC | PRN
  Administered 2014-02-18: 100 mg via INTRAVENOUS

## 2014-02-18 MED ORDER — DEXAMETHASONE SODIUM PHOSPHATE 10 MG/ML IJ SOLN
INTRAMUSCULAR | Status: DC | PRN
  Administered 2014-02-18: 10 mg via INTRAVENOUS

## 2014-02-18 MED ORDER — ROCURONIUM BROMIDE 100 MG/10ML IV SOLN
INTRAVENOUS | Status: AC
  Filled 2014-02-18: qty 1

## 2014-02-18 MED ORDER — PROPOFOL 10 MG/ML IV BOLUS
INTRAVENOUS | Status: AC
  Filled 2014-02-18: qty 20

## 2014-02-18 MED ORDER — HYDROMORPHONE HCL PF 1 MG/ML IJ SOLN
0.2500 mg | INTRAMUSCULAR | Status: DC | PRN
  Administered 2014-02-18: 0.5 mg via INTRAVENOUS

## 2014-02-18 MED ORDER — SODIUM CHLORIDE 0.9 % IJ SOLN: 3.0000 mL | Freq: Two times a day (BID) | INTRAMUSCULAR | Status: DC

## 2014-02-18 MED ORDER — PROMETHAZINE HCL 25 MG/ML IJ SOLN
INTRAMUSCULAR | Status: AC
  Filled 2014-02-18: qty 1

## 2014-02-18 MED ORDER — BUPIVACAINE HCL (PF) 0.5 % IJ SOLN
INTRAMUSCULAR | Status: DC | PRN
  Administered 2014-02-18: 20 mL

## 2014-02-18 MED ORDER — FENTANYL CITRATE 0.05 MG/ML IJ SOLN
INTRAMUSCULAR | Status: AC
  Filled 2014-02-18: qty 2

## 2014-02-18 MED ORDER — ACETAMINOPHEN 10 MG/ML IV SOLN
1000.0000 mg | Freq: Once | INTRAVENOUS | Status: AC
  Administered 2014-02-18: 1000 mg via INTRAVENOUS
  Filled 2014-02-18: qty 100

## 2014-02-18 MED ORDER — OXYCODONE-ACETAMINOPHEN 7.5-325 MG PO TABS: 1.0000 | ORAL_TABLET | ORAL | Status: DC | PRN

## 2014-02-18 MED ORDER — PROMETHAZINE HCL 25 MG/ML IJ SOLN
6.2500 mg | INTRAMUSCULAR | Status: DC | PRN
  Administered 2014-02-18: 6.25 mg via INTRAVENOUS

## 2014-02-18 MED ORDER — BUPIVACAINE HCL (PF) 0.5 % IJ SOLN
INTRAMUSCULAR | Status: AC
  Filled 2014-02-18: qty 30

## 2014-02-18 MED ORDER — LACTATED RINGERS IV SOLN: INTRAVENOUS | Status: DC

## 2014-02-18 MED ORDER — VANCOMYCIN HCL IN DEXTROSE 1-5 GM/200ML-% IV SOLN
INTRAVENOUS | Status: AC
  Filled 2014-02-18: qty 200

## 2014-02-18 MED ORDER — ROCURONIUM BROMIDE 100 MG/10ML IV SOLN
INTRAVENOUS | Status: DC | PRN
  Administered 2014-02-18: 20 mg via INTRAVENOUS

## 2014-02-18 MED ORDER — GLYCOPYRROLATE 0.2 MG/ML IJ SOLN
INTRAMUSCULAR | Status: AC
  Filled 2014-02-18: qty 1

## 2014-02-18 MED ORDER — SODIUM CHLORIDE 0.9 % IV SOLN
INTRAVENOUS | Status: DC
  Administered 2014-02-18: 18:00:00 via INTRAVENOUS

## 2014-02-18 MED ORDER — NEOSTIGMINE METHYLSULFATE 10 MG/10ML IV SOLN
INTRAVENOUS | Status: AC
  Filled 2014-02-18: qty 1

## 2014-02-18 MED ORDER — ACETAMINOPHEN 325 MG PO TABS: 650.0000 mg | ORAL_TABLET | ORAL | Status: DC | PRN

## 2014-02-18 MED ORDER — ONDANSETRON HCL 4 MG/2ML IJ SOLN
INTRAMUSCULAR | Status: AC
  Filled 2014-02-18: qty 2

## 2014-02-18 MED ORDER — HYDROMORPHONE HCL PF 1 MG/ML IJ SOLN
INTRAMUSCULAR | Status: AC
  Filled 2014-02-18: qty 1

## 2014-02-18 MED ORDER — OXYCODONE HCL 5 MG PO TABS
5.0000 mg | ORAL_TABLET | ORAL | Status: DC | PRN
  Filled 2014-02-18 (×2): qty 2

## 2014-02-18 MED ORDER — ONDANSETRON HCL 4 MG/2ML IJ SOLN
INTRAMUSCULAR | Status: DC | PRN
  Administered 2014-02-18: 4 mg via INTRAVENOUS

## 2014-02-18 MED ORDER — SCOPOLAMINE 1 MG/3DAYS TD PT72
MEDICATED_PATCH | TRANSDERMAL | Status: AC
  Filled 2014-02-18: qty 1

## 2014-02-18 MED ORDER — MIDAZOLAM HCL 2 MG/2ML IJ SOLN
INTRAMUSCULAR | Status: AC
  Filled 2014-02-18: qty 2

## 2014-02-18 MED ORDER — EPHEDRINE SULFATE 50 MG/ML IJ SOLN
INTRAMUSCULAR | Status: DC | PRN
  Administered 2014-02-18: 10 mg via INTRAVENOUS

## 2014-02-18 MED ORDER — LIDOCAINE HCL (CARDIAC) 20 MG/ML IV SOLN
INTRAVENOUS | Status: DC | PRN
  Administered 2014-02-18: 80 mg via INTRAVENOUS

## 2014-02-18 MED ORDER — NEOSTIGMINE METHYLSULFATE 10 MG/10ML IV SOLN
INTRAVENOUS | Status: DC | PRN
  Administered 2014-02-18: 3 mg via INTRAVENOUS

## 2014-02-18 MED ORDER — SODIUM CHLORIDE 0.9 % IJ SOLN: 3.0000 mL | INTRAMUSCULAR | Status: DC | PRN

## 2014-02-18 MED ORDER — FENTANYL CITRATE 0.05 MG/ML IJ SOLN
INTRAMUSCULAR | Status: DC | PRN
  Administered 2014-02-18 (×9): 50 ug via INTRAVENOUS

## 2014-02-18 MED ORDER — LACTATED RINGERS IV SOLN
INTRAVENOUS | Status: DC
  Administered 2014-02-18: 12:00:00 via INTRAVENOUS
  Administered 2014-02-18: 1000 mL via INTRAVENOUS

## 2014-02-18 MED ORDER — PROPOFOL 10 MG/ML IV BOLUS
INTRAVENOUS | Status: DC | PRN
  Administered 2014-02-18: 170 mg via INTRAVENOUS

## 2014-02-18 MED ORDER — SODIUM CHLORIDE 0.9 % IJ SOLN
INTRAMUSCULAR | Status: AC
Start: 2014-02-18 — End: 2014-02-18
  Filled 2014-02-18: qty 10

## 2014-02-18 MED ORDER — SODIUM CHLORIDE 0.9 % IV SOLN: 250.0000 mL | INTRAVENOUS | Status: DC | PRN

## 2014-02-18 MED ORDER — HYDROMORPHONE HCL PF 1 MG/ML IJ SOLN
0.2500 mg | INTRAMUSCULAR | Status: DC | PRN
  Administered 2014-02-18 (×4): 0.5 mg via INTRAVENOUS

## 2014-02-18 MED ORDER — ACETAMINOPHEN 650 MG RE SUPP: 650.0000 mg | RECTAL | Status: DC | PRN

## 2014-02-18 MED ORDER — HYDROMORPHONE HCL PF 2 MG/ML IJ SOLN
INTRAMUSCULAR | Status: AC
  Filled 2014-02-18: qty 1

## 2014-02-18 MED ORDER — FENTANYL CITRATE 0.05 MG/ML IJ SOLN
INTRAMUSCULAR | Status: AC
  Filled 2014-02-18: qty 5

## 2014-02-18 MED ORDER — ONDANSETRON HCL 4 MG/2ML IJ SOLN
4.0000 mg | Freq: Four times a day (QID) | INTRAMUSCULAR | Status: DC | PRN
  Administered 2014-02-18 – 2014-02-19 (×3): 4 mg via INTRAVENOUS
  Filled 2014-02-18 (×3): qty 2

## 2014-02-18 MED ORDER — GLYCOPYRROLATE 0.2 MG/ML IJ SOLN
INTRAMUSCULAR | Status: AC
  Filled 2014-02-18: qty 2

## 2014-02-18 MED ORDER — HEPARIN SODIUM (PORCINE) 5000 UNIT/ML IJ SOLN
5000.0000 [IU] | Freq: Three times a day (TID) | INTRAMUSCULAR | Status: DC
  Administered 2014-02-19: 5000 [IU] via SUBCUTANEOUS
  Filled 2014-02-18 (×4): qty 1

## 2014-02-18 MED ORDER — FENTANYL CITRATE 0.05 MG/ML IJ SOLN
25.0000 ug | INTRAMUSCULAR | Status: DC | PRN
  Administered 2014-02-18 (×3): 50 ug via INTRAVENOUS
  Filled 2014-02-18 (×3): qty 2

## 2014-02-18 MED ORDER — OXYCODONE HCL 5 MG PO TABS
5.0000 mg | ORAL_TABLET | ORAL | Status: DC | PRN
  Administered 2014-02-18 – 2014-02-19 (×3): 10 mg via ORAL
  Filled 2014-02-18: qty 2

## 2014-02-18 MED ORDER — GLYCOPYRROLATE 0.2 MG/ML IJ SOLN
INTRAMUSCULAR | Status: DC | PRN
  Administered 2014-02-18: 0.4 mg via INTRAVENOUS

## 2014-02-18 MED ORDER — HYDROMORPHONE HCL PF 1 MG/ML IJ SOLN
INTRAMUSCULAR | Status: DC | PRN
  Administered 2014-02-18 (×2): 1 mg via INTRAVENOUS

## 2014-02-18 MED ORDER — VANCOMYCIN HCL IN DEXTROSE 1-5 GM/200ML-% IV SOLN
1000.0000 mg | INTRAVENOUS | Status: AC
  Administered 2014-02-18: 1000 mg via INTRAVENOUS

## 2014-02-18 MED ORDER — ACETAMINOPHEN 325 MG PO TABS: 650.0000 mg | ORAL_TABLET | Freq: Four times a day (QID) | ORAL | Status: DC | PRN

## 2014-02-18 MED ORDER — ACETAMINOPHEN 650 MG RE SUPP: 650.0000 mg | Freq: Four times a day (QID) | RECTAL | Status: DC | PRN

## 2014-02-18 MED ORDER — 0.9 % SODIUM CHLORIDE (POUR BTL) OPTIME
TOPICAL | Status: DC | PRN
  Administered 2014-02-18: 1000 mL

## 2014-02-18 MED ORDER — MORPHINE SULFATE 2 MG/ML IJ SOLN
2.0000 mg | INTRAMUSCULAR | Status: DC | PRN
  Administered 2014-02-18 – 2014-02-19 (×3): 2 mg via INTRAVENOUS
  Filled 2014-02-18 (×3): qty 1

## 2014-02-18 SURGICAL SUPPLY — 30 items
APPLIER CLIP 5 13 M/L LIGAMAX5 (MISCELLANEOUS) ×2
BANDAGE ADH SHEER 1  50/CT (GAUZE/BANDAGES/DRESSINGS) ×8 IMPLANT
BENZOIN TINCTURE PRP APPL 2/3 (GAUZE/BANDAGES/DRESSINGS) ×2 IMPLANT
CANISTER SUCTION 2500CC (MISCELLANEOUS) ×2 IMPLANT
CHLORAPREP W/TINT 26ML (MISCELLANEOUS) ×2 IMPLANT
CLIP APPLIE 5 13 M/L LIGAMAX5 (MISCELLANEOUS) ×1 IMPLANT
COVER MAYO STAND STRL (DRAPES) IMPLANT
DECANTER SPIKE VIAL GLASS SM (MISCELLANEOUS) IMPLANT
DRAPE C-ARM 42X120 X-RAY (DRAPES) IMPLANT
DRAPE LAPAROSCOPIC ABDOMINAL (DRAPES) ×2 IMPLANT
DRAPE UTILITY XL STRL (DRAPES) ×2 IMPLANT
ELECT REM PT RETURN 9FT ADLT (ELECTROSURGICAL) ×2
ELECTRODE REM PT RTRN 9FT ADLT (ELECTROSURGICAL) ×1 IMPLANT
GLOVE SURG SIGNA 7.5 PF LTX (GLOVE) ×2 IMPLANT
GOWN STRL REUS W/TWL XL LVL3 (GOWN DISPOSABLE) ×8 IMPLANT
HEMOSTAT SURGICEL 4X8 (HEMOSTASIS) IMPLANT
KIT BASIN OR (CUSTOM PROCEDURE TRAY) ×2 IMPLANT
POUCH SPECIMEN RETRIEVAL 10MM (ENDOMECHANICALS) ×2 IMPLANT
SET CHOLANGIOGRAPH MIX (MISCELLANEOUS) IMPLANT
SET IRRIG TUBING LAPAROSCOPIC (IRRIGATION / IRRIGATOR) ×2 IMPLANT
SOLUTION ANTI FOG 6CC (MISCELLANEOUS) ×2 IMPLANT
STRIP CLOSURE SKIN 1/2X4 (GAUZE/BANDAGES/DRESSINGS) ×2 IMPLANT
SUT MNCRL AB 4-0 PS2 18 (SUTURE) ×2 IMPLANT
TOWEL OR 17X26 10 PK STRL BLUE (TOWEL DISPOSABLE) ×2 IMPLANT
TOWEL OR NON WOVEN STRL DISP B (DISPOSABLE) ×2 IMPLANT
TRAY LAP CHOLE (CUSTOM PROCEDURE TRAY) ×2 IMPLANT
TROCAR BLADELESS OPT 5 75 (ENDOMECHANICALS) ×2 IMPLANT
TROCAR SLEEVE XCEL 5X75 (ENDOMECHANICALS) ×4 IMPLANT
TROCAR XCEL BLUNT TIP 100MML (ENDOMECHANICALS) ×2 IMPLANT
TUBING INSUFFLATION 10FT LAP (TUBING) ×2 IMPLANT

## 2014-02-18 NOTE — Plan of Care (Signed)
Problem: Phase I Progression Outcomes Goal: Pain controlled with appropriate interventions Outcome: Not Met (add Reason) progressing

## 2014-02-18 NOTE — Discharge Instructions (Signed)
[facility] ______[facility] °LAPAROSCOPIC SURGERY: POST OP INSTRUCTIONS °Always review your discharge instruction sheet given to you by the facility where your surgery was performed. °IF YOU HAVE DISABILITY OR FAMILY LEAVE FORMS, YOU MUST BRING THEM TO THE OFFICE FOR PROCESSING.   °DO NOT GIVE THEM TO YOUR DOCTOR. ° °1. A prescription for pain medication may be given to you upon discharge.  Take your pain medication as prescribed, if needed.  If narcotic pain medicine is not needed, then you may take acetaminophen (Tylenol) or ibuprofen (Advil) as needed. °2. Take your usually prescribed medications unless otherwise directed. °3. If you need a refill on your pain medication, please contact your pharmacy.  They will contact our office to request authorization. Prescriptions will not be filled after 5pm or on week-ends. °4. You should follow a light diet the first few days after arrival home, such as soup and crackers, etc.  Be sure to include lots of fluids daily. °5. Most patients will experience some swelling and bruising in the area of the incisions.  Ice packs will help.  Swelling and bruising can take several days to resolve.  °6. It is common to experience some constipation if taking pain medication after surgery.  Increasing fluid intake and taking a stool softener (such as Colace) will usually help or prevent this problem from occurring.  A mild laxative (Milk of Magnesia or Miralax) should be taken according to package instructions if there are no bowel movements after 48 hours. °7. Unless discharge instructions indicate otherwise, you may remove your bandages 24-48 hours after surgery, and you may shower at that time.  You may have steri-strips (small skin tapes) in place directly over the incision.  These strips should be left on the skin for 7-10 days.  If your surgeon used skin glue on the incision, you may shower in 24 hours.  The glue will flake off over the next 2-3 weeks.  Any sutures or  staples will be removed at the office during your follow-up visit. °8. ACTIVITIES:  You may resume regular (light) daily activities beginning the next day--such as daily self-care, walking, climbing stairs--gradually increasing activities as tolerated.  You may have sexual intercourse when it is comfortable.  Refrain from any heavy lifting or straining until approved by your doctor. °a. You may drive when you are no longer taking prescription pain medication, you can comfortably wear a seatbelt, and you can safely maneuver your car and apply brakes. °b. RETURN TO WORK:  __________________________________________________________ °9. You should see your doctor in the office for a follow-up appointment approximately 2-3 weeks after your surgery.  Make sure that you call for this appointment within a day or two after you arrive home to insure a convenient appointment time. °10. OTHER INSTRUCTIONS: __________________________________________________________________________________________________________________________ __________________________________________________________________________________________________________________________ °WHEN TO CALL YOUR DOCTOR: °1. Fever over 101.0 °2. Inability to urinate °3. Continued bleeding from incision. °4. Increased pain, redness, or drainage from the incision. °5. Increasing abdominal pain ° °The clinic staff is available to answer your questions during regular business hours.  Please don’t hesitate to call and ask to speak to one of the nurses for clinical concerns.  If you have a medical emergency, go to the nearest emergency room or call 911.  A surgeon from [facility] is always on call at the hospital. °[address], [address], [place], [place]  [address] ? [address] [address], [place], [place]   [address] °[phone] ? [phone] ? FAX [phone] °Web site:   [email or web address] °

## 2014-02-18 NOTE — Anesthesia Postprocedure Evaluation (Signed)
Anesthesia Post Note  Patient: [name] [name]  Procedure(s) Performed: Procedure(s) (LRB): LAPAROSCOPIC CHOLECYSTECTOMY (N/A)  Anesthesia type: MAC  Patient location: PACU  Post pain: Pain level controlled  Post assessment: Post-op Vital signs reviewed  Last Vitals:  Filed Vitals:   02/18/14 1220  BP:   Pulse: 66  Temp:   Resp: 12    Post vital signs: Reviewed  Level of consciousness: sedated  Complications: No apparent anesthesia complications

## 2014-02-18 NOTE — Transfer of Care (Signed)
Immediate Anesthesia Transfer of Care Note  Patient: [name] [name]  Procedure(s) Performed: Procedure(s) (LRB): LAPAROSCOPIC CHOLECYSTECTOMY (N/A)  Patient Location: PACU  Anesthesia Type: General  Level of Consciousness: sedated, patient cooperative and responds to stimulation  Airway & Oxygen Therapy: Patient Spontanous Breathing and Patient connected to face mask oxgen  Post-op Assessment: Report given to PACU RN and Post -op Vital signs reviewed and stable  Post vital signs: Reviewed and stable  Complications: No apparent anesthesia complications

## 2014-02-18 NOTE — H&P (Signed)
Chief Complaint   Patient presents with   .  eval gallbladder   HPI  [name] [name] is a 58 y.o. female.  HPI  She is referred by Dr. Carlean Purl for evaluation of chronic abdominal pain. She has had pain in her abdomen about 15 years. Mostly recently, the pain is constant and in the epigastrium. It is sharp in nature. She has chronic constipation. Interestingly, she has had an abnormal HIDA scan in 2004 with decreased EF which was then normal in 2008. Dr. Carlean Purl has done an extensive workup including endoscopy.  Past Medical History   Diagnosis  Date   .  UTI  10/06/2010   .  ROTATOR CUFF REPAIR, HX OF  09/11/2007   .  APPENDECTOMY, HX OF  09/11/2007   .  TAH/BSO, HX OF  09/11/2007   .  HX, PERSONAL, MUSCULOSKELETAL DISORD NEC  07/16/2007   .  HX, PERSONAL, URINARY CALCULI  07/16/2007   .  HIATAL HERNIA, HX OF  09/11/2007   .  FX, RAMUS NOS, CLOSED  07/16/2007   .  ABDOMINAL PAIN, LOWER  02/16/2009   .  SYMPTOM, PAIN, ABDOMINAL, RIGHT UP QUADRANT  07/16/2007   .  Dysuria  02/20/2008   .  CHEST PAIN, ACUTE  09/03/2008   .  OSTEOPENIA  10/23/2007   .  BACK PAIN  07/16/2007     chronic   .  CERVICAL RADICULOPATHY, RIGHT  07/16/2007   .  OSTEOARTHRITIS  09/11/2007   .  VAGINITIS, ATROPHIC, POSTMENOPAUSAL  07/16/2007   .  Irritable bowel syndrome  09/11/2007   .  HEMORRHOIDS  09/11/2007   .  Chronic pain syndrome  09/11/2007   .  ATTENTION DEFICIT DISORDER, ADULT  02/02/2009   .  DEPRESSION/ANXIETY  06/17/2010   .  NEOPLASM, SKIN, UNCERTAIN BEHAVIOR  01/30/8468   .  Suicidal ideations    .  Chronic abdominal pain    .  Functional GI symptoms    .  Fibromyalgia    .  H/O failed conscious sedation      PT STATES SHE IS HARD TO SEDATE!    Past Surgical History   Procedure  Laterality  Date   .  Appendectomy     .  Rotator cuff repair       right   .  Total abdominal hysterectomy w/ bilateral salpingoophorectomy     .  Cervical radiculopathy       RT   .  Esophagogastroduodenoscopy      .  Colonoscopy     .  Tens unit       lower back    Family History   Problem  Relation  Age of Onset   .  Heart disease  Mother      CAD/ valvular disease   .  Kidney disease  Mother      lost a kidney - unknown cause   .  Heart disease  Brother      CAD   .  Cancer  Other      Colon   .  COPD  Father    .  Heart disease  Father  69     MI   .  Heart disease  Sister    .  Heart disease  Maternal Aunt    .  Heart disease  Maternal Grandmother    .  Colon cancer  Cousin    Social History  History   Substance  Use Topics   .  Smoking status:  Never Smoker   .  Smokeless tobacco:  Never Used   .  Alcohol Use:  No    Allergies   Allergen  Reactions   .  Ciprofloxacin  Itching and Palpitations   .  Atorvastatin    .  Buspirone Hcl      REACTION: intolerance   .  Ezetimibe-Simvastatin    .  Latex      Redness and burning   .  Lyrica [Pregabalin]      Caused swelling in legs and hands   .  Penicillins    .  Sulfa Antibiotics  Nausea Only    Current Outpatient Prescriptions   Medication  Sig  Dispense  Refill   .  ALPRAZolam (XANAX) 1 MG tablet  Take 1 mg by mouth 4 (four) times daily.     Marland Kitchen  BRINTELLIX 20 MG TABS  Take 20 mg by mouth as needed.     .  celecoxib (CELEBREX) 200 MG capsule  Take 200 mg by mouth daily as needed for mild pain or moderate pain.     Marland Kitchen  dicyclomine (BENTYL) 20 MG tablet  Take 1 tablet (20 mg total) by mouth 3 (three) times daily as needed for spasms.  30 tablet  0   .  Docusate Sodium (DULCOLAX STOOL SOFTENER PO)  Take by mouth daily.     .  fentaNYL (DURAGESIC - DOSED MCG/HR) 75 MCG/HR  Place 150 mcg onto the skin every other day.     Marland Kitchen  glycopyrrolate (ROBINUL) 2 MG tablet  Take 1 tablet (2 mg total) by mouth 2 (two) times daily.  60 tablet  3   .  methocarbamol (ROBAXIN) 750 MG tablet  Take 750 mg by mouth every 8 (eight) hours as needed for muscle spasms.     Marland Kitchen  omeprazole (PRILOSEC) 20 MG capsule  Take 1 capsule (20 mg total) by mouth 2  (two) times daily.  60 capsule  2   .  ondansetron (ZOFRAN) 8 MG tablet  Take 1 tablet (8 mg total) by mouth every 4 (four) hours as needed for nausea or vomiting.  30 tablet  1   .  oxyCODONE (OXYCONTIN) 15 MG TB12  Take 15 mg by mouth every 6 (six) hours as needed (for pain).     .  polyethylene glycol powder (GLYCOLAX/MIRALAX) powder  Take 17 g by mouth daily as needed for mild constipation or moderate constipation.     .  promethazine (PROMETHEGAN) 25 MG suppository  Place 1 suppository (25 mg total) rectally every 6 (six) hours as needed for nausea (place 1 suppository every 6 hrs as needed for nausea).  12 each  5   .  traZODone (DESYREL) 50 MG tablet  Take 150 mg by mouth at bedtime. Can take up to 3 pills     .  venlafaxine XR (EFFEXOR-XR) 150 MG 24 hr capsule  Take 300 mg by mouth daily.      No current facility-administered medications for this visit.   Review of Systems  Review of Systems  Constitutional: Negative for fever, chills and unexpected weight change.  HENT: Negative for congestion, hearing loss, sore throat, trouble swallowing and voice change.  Eyes: Negative for visual disturbance.  Respiratory: Negative for cough and wheezing.  Cardiovascular: Negative for chest pain, palpitations and leg swelling.  Gastrointestinal: Positive for nausea, vomiting, abdominal pain and abdominal distention. Negative for diarrhea, constipation, blood  in stool and anal bleeding.  Genitourinary: Negative for hematuria, vaginal bleeding and difficulty urinating.  Musculoskeletal: Negative for arthralgias.  Skin: Negative for rash and wound.  Neurological: Negative for seizures, syncope and headaches.  Hematological: Negative for adenopathy. Does not bruise/bleed easily.  Psychiatric/Behavioral: Negative for confusion.  Blood pressure 128/82, pulse 65, temperature 97.8 F (36.6 C), height 5\' 2"  (1.575 m), weight 126 lb (57.153 kg).  Physical Exam  Physical Exam  Constitutional: She is  oriented to person, place, and time. She appears well-developed and well-nourished. No distress.  HENT:  Head: Normocephalic and atraumatic.  Right Ear: External ear normal.  Left Ear: External ear normal.  Nose: Nose normal.  Mouth/Throat: Oropharynx is clear and moist. No oropharyngeal exudate.  Eyes: Conjunctivae are normal. Pupils are equal, round, and reactive to light. Right eye exhibits no discharge. Left eye exhibits no discharge. No scleral icterus.  Neck: Normal range of motion. Neck supple. No tracheal deviation present.  Cardiovascular: Normal rate, regular rhythm, normal heart sounds and intact distal pulses.  No murmur heard.  Pulmonary/Chest: Effort normal and breath sounds normal.  Abdominal: Soft. Bowel sounds are normal. There is tenderness. There is guarding.  Tenderness and guarding in the epigastrium and RUQ  Musculoskeletal: Normal range of motion. She exhibits no edema and no tenderness.  Lymphadenopathy:  She has no cervical adenopathy.  Neurological: She is alert and oriented to person, place, and time.  Skin: Skin is warm and dry. No rash noted. She is not diaphoretic. No erythema.  Psychiatric: Her behavior is normal.  Data Reviewed  U/S shows thick gallbladder sludge. CT abd/pelvis is normal. HIDA shows EF of 29%  Assessment  Chronic abdominal pain with gallbladder sludge and suspected chronic cholecystitis  Plan  Given her symptoms and xray studies, I suspect her gallbladder disease is causing some of her abdominal complaints. I recommend a lap chole. I discussed the procedure in detail. The patient was given Neurosurgeon. We discussed the risks and benefits of a laparoscopic cholecystectomy and possible cholangiogram including, but not limited to bleeding, infection, injury to surrounding structures such as the intestine or liver, bile leak, retained gallstones, need to convert to an open procedure, prolonged diarrhea, blood clots such as DVT, common  bile duct injury, anesthesia risks, and possible need for additional procedures. We discussed the chance that this might not improve her symptoms. We discussed the typical post-operative recovery course.

## 2014-02-18 NOTE — Op Note (Signed)
Laparoscopic Cholecystectomy Procedure Note  Indications: This patient presents with symptomatic gallbladder disease and will undergo laparoscopic cholecystectomy.  Pre-operative Diagnosis: symptomatic gallbladder sludge  Post-operative Diagnosis: Same with chronic cholecystitis  Surgeon: Harl Bowie   Assistants: 0  Anesthesia: General endotracheal anesthesia  ASA Class: 3  Procedure Details  The patient was seen again in the Holding Room. The risks, benefits, complications, treatment options, and expected outcomes were discussed with the patient. The possibilities of reaction to medication, pulmonary aspiration, perforation of viscus, bleeding, recurrent infection, finding a normal gallbladder, the need for additional procedures, failure to diagnose a condition, the possible need to convert to an open procedure, and creating a complication requiring transfusion or operation were discussed with the patient. The likelihood of improving the patient's symptoms with return to their baseline status is good.  The patient and/or family concurred with the proposed plan, giving informed consent. The site of surgery properly noted. The patient was taken to Operating Room, identified as [name] [name] and the procedure verified as Laparoscopic Cholecystectomy with Intraoperative Cholangiogram. A Time Out was held and the above information confirmed.  Prior to the induction of general anesthesia, antibiotic prophylaxis was administered. General endotracheal anesthesia was then administered and tolerated well. After the induction, the abdomen was prepped with Chloraprep and draped in sterile fashion. The patient was positioned in the supine position.  Local anesthetic agent was injected into the skin near the umbilicus and an incision made. We dissected down to the abdominal fascia with blunt dissection.  The fascia was incised vertically and we entered the peritoneal cavity bluntly.  A pursestring  suture of 0-Vicryl was placed around the fascial opening.  The Hasson cannula was inserted and secured with the stay suture.  Pneumoperitoneum was then created with CO2 and tolerated well without any adverse changes in the patient's vital signs. An 11-mm port was placed in the subxiphoid position.  Two 5-mm ports were placed in the right upper quadrant. All skin incisions were infiltrated with a local anesthetic agent before making the incision and placing the trocars.   We positioned the patient in reverse Trendelenburg, tilted slightly to the patient's left.  The gallbladder was identified, the fundus grasped and retracted cephalad. Adhesions were lysed bluntly and with the electrocautery where indicated, taking care not to injure any adjacent organs or viscus. The infundibulum was grasped and retracted laterally, exposing the peritoneum overlying the triangle of Calot. This was then divided and exposed in a blunt fashion. The cystic duct was clearly identified and bluntly dissected circumferentially. A critical view of the cystic duct and cystic artery was obtained.  The cystic duct was then ligated with clips and divided. The cystic artery was, dissected free, ligated with clips and divided as well.   The gallbladder was dissected from the liver bed in retrograde fashion with the electrocautery. The gallbladder was removed and placed in an Endocatch sac. The liver bed was irrigated and inspected. Hemostasis was achieved with the electrocautery. Copious irrigation was utilized and was repeatedly aspirated until clear.  The gallbladder and Endocatch sac were then removed through the umbilical port site.  The pursestring suture was used to close the umbilical fascia.    We again inspected the right upper quadrant for hemostasis.  Pneumoperitoneum was released as we removed the trocars.  4-0 Monocryl was used to close the skin.   Benzoin, steri-strips, and clean dressings were applied. The patient was then  extubated and brought to the recovery room in stable  condition. Instrument, sponge, and needle counts were correct at closure and at the conclusion of the case.   Findings: Cholecystitis with sludge  Estimated Blood Loss: Minimal         Drains: 0         Specimens: Gallbladder           Complications: None; patient tolerated the procedure well.         Disposition: PACU - hemodynamically stable.         Condition: stable

## 2014-02-18 NOTE — Anesthesia Preprocedure Evaluation (Signed)
Anesthesia Evaluation  Patient identified by MRN, date of birth, ID band Patient awake    Reviewed: Allergy & Precautions, H&P , NPO status , Patient's Chart, lab work & pertinent test results  History of Anesthesia Complications (+) PONV and Family history of anesthesia reaction  Airway Mallampati: II TM Distance: >3 FB   Mouth opening: Limited Mouth Opening  Dental  (+) Teeth Intact, Dental Advisory Given, Chipped,    Pulmonary neg pulmonary ROS,  breath sounds clear to auscultation  Pulmonary exam normal       Cardiovascular negative cardio ROS  Rhythm:Regular Rate:Normal     Neuro/Psych Depression Chronic pain  Neuromuscular disease negative neurological ROS     GI/Hepatic negative GI ROS, Neg liver ROS, GERD-  Medicated,  Endo/Other  negative endocrine ROS  Renal/GU negative Renal ROS  negative genitourinary   Musculoskeletal  (+) Fibromyalgia -  Abdominal   Peds  Hematology negative hematology ROS (+)   Anesthesia Other Findings Patient states she took po oxycontin and her xanax dose this AM. Hx of intraoperative awareness during colonoscopy and upper endoscopy as well as D&C.  Reproductive/Obstetrics                           Anesthesia Physical Anesthesia Plan  ASA: III  Anesthesia Plan: General   Post-op Pain Management:    Induction: Intravenous  Airway Management Planned: Oral ETT  Additional Equipment:   Intra-op Plan:   Post-operative Plan: Extubation in OR  Informed Consent: I have reviewed the patients History and Physical, chart, labs and discussed the procedure including the risks, benefits and alternatives for the proposed anesthesia with the patient or authorized representative who has indicated his/her understanding and acceptance.   Dental advisory given  Plan Discussed with: CRNA  Anesthesia Plan Comments:         Anesthesia Quick  Evaluation

## 2014-02-19 ENCOUNTER — Encounter (HOSPITAL_COMMUNITY): Payer: Self-pay | Admitting: Surgery

## 2014-02-19 ENCOUNTER — Telehealth (INDEPENDENT_AMBULATORY_CARE_PROVIDER_SITE_OTHER): Payer: Self-pay

## 2014-02-19 NOTE — Progress Notes (Signed)
Pt was given AVS and prescriptions. Pts friend/caregiver verbalized understanding of d/c instructions. Pt was reluctant to go home today. Pt was wheeled to private vehicle by CNA. Pts friend took all personal belongings. Pt did not allow for physical assessment and ws in an angry mood.  PT was thanked for allowing [name] [name] to provide her medical needs.  Birdie Hopes 02/19/2014 8:00 AM

## 2014-02-19 NOTE — Discharge Summary (Signed)
Physician Discharge Summary  Patient ID: [name] [name] MRN: [id number] DOB/AGE: 58-Jan-1957 58 y.o.  Admit date: 02/18/2014 Discharge date: 02/19/2014  Admission Diagnoses:  Discharge Diagnoses:  Active Problems:   S/P laparoscopic cholecystectomy chronic pain  Discharged Condition: fair  Hospital Course: uneventful lap chole.  Has chronic pain.  Unsure currently is surgery has helped.  Discharged home POD#1  Consults: None  Significant Diagnostic Studies:   Treatments: surgery: lap chole  Discharge Exam: Blood pressure 123/82, pulse 77, temperature 99.3 F (37.4 C), temperature source Oral, resp. rate 16, height 5\' 4"  (1.626 m), weight 125 lb (56.7 kg), SpO2 97.00%. General appearance: alert and no distress Resp: clear to auscultation bilaterally Cardio: regular rate and rhythm, S1, S2 normal, no murmur, click, rub or gallop Incision/Wound:dressings dry, intact  Disposition: 01-Home or Self Care     Medication List         ALPRAZolam 1 MG tablet  Commonly known as:  XANAX  Take 1 mg by mouth 3 (three) times daily as needed for anxiety.     BRINTELLIX 20 MG Tabs  Generic drug:  Vortioxetine HBr  Take 20 mg by mouth daily as needed (muscle spasm).     celecoxib 200 MG capsule  Commonly known as:  CELEBREX  Take 200 mg by mouth daily as needed for mild pain or moderate pain.     dicyclomine 20 MG tablet  Commonly known as:  BENTYL  Take 1 tablet (20 mg total) by mouth 3 (three) times daily as needed for spasms.     DULCOLAX STOOL SOFTENER PO  Take 300 mg by mouth daily.     fentaNYL 75 MCG/HR  Commonly known as:  DURAGESIC - dosed mcg/hr  Place 75 mcg onto the skin every 3 (three) days. Work 2  Of the 75 mg patch     glycopyrrolate 2 MG tablet  Commonly known as:  ROBINUL  Take 1 tablet (2 mg total) by mouth 2 (two) times daily.     methocarbamol 750 MG tablet  Commonly known as:  ROBAXIN  Take 750 mg by mouth every 8 (eight) hours as needed for muscle  spasms.     omeprazole 20 MG capsule  Commonly known as:  PRILOSEC  Take 1 capsule (20 mg total) by mouth 2 (two) times daily.     ondansetron 8 MG tablet  Commonly known as:  ZOFRAN  Take 1 tablet (8 mg total) by mouth every 4 (four) hours as needed for nausea or vomiting.     oxyCODONE 15 MG Tb12  Commonly known as:  OXYCONTIN  Take 15 mg by mouth every 6 (six) hours as needed (for pain).     oxyCODONE-acetaminophen 7.5-325 MG per tablet  Commonly known as:  PERCOCET  Take 1-2 tablets by mouth every 4 (four) hours as needed for pain.     polyethylene glycol powder powder  Commonly known as:  GLYCOLAX/MIRALAX  Take 17 g by mouth daily as needed for mild constipation or moderate constipation.     promethazine 25 MG suppository  Commonly known as:  PROMETHEGAN  Place 1 suppository (25 mg total) rectally every 6 (six) hours as needed for nausea (place 1 suppository every 6 hrs as needed for nausea).     traZODone 50 MG tablet  Commonly known as:  DESYREL  Take 50-150 mg by mouth at bedtime. Can take up to 3 pills     venlafaxine XR 150 MG 24 hr capsule  Commonly known as:  EFFEXOR-XR  Take 300 mg by mouth daily at 12 noon.           Follow-up Information   Follow up with [facility], MD. Schedule an appointment as soon as possible for a visit in 3 weeks.   Specialty:  General Surgery   Contact information:   [address] [place] [address] [phone]       Signed: Harl Bowie 02/19/2014, 6:37 AM

## 2014-02-19 NOTE — Progress Notes (Signed)
1 Day Post-Op  Subjective: Hemodynamically stable overnight Tolerated po Has chronic pain preop  Objective: Vital signs in last 24 hours: Temp:  [97.5 F (36.4 C)-99.3 F (37.4 C)] 99.3 F (37.4 C) (05/28 0603) Pulse Rate:  [61-88] 77 (05/28 0603) Resp:  [10-16] 16 (05/28 0603) BP: (93-150)/(57-98) 123/82 mmHg (05/28 0603) SpO2:  [94 %-100 %] 97 % (05/28 0603) Weight:  [125 lb (56.7 kg)] 125 lb (56.7 kg) (05/27 1355) Last BM Date: 02/04/14 (Stated not since her colonoscopy 2 weeks ago)  Intake/Output from previous day: 05/27 0701 - 05/28 0700 In: 2890 [P.O.:1490; I.V.:1400] Out: 3500 [Urine:3500] Intake/Output this shift: Total I/O In: 480 [P.O.:480] Out: 2450 [Urine:2450]  Abdomen soft, appropriately tender, dressings dry  Lab Results:   Recent Labs  02/17/14 1445  WBC 9.6  HGB 17.2*  HCT 49.6*  PLT 340   BMET  Recent Labs  02/17/14 1445  NA 138  K 4.5  CL 96  CO2 26  GLUCOSE 97  BUN 12  CREATININE 0.96  CALCIUM 9.7   PT/INR No results found for this basename: LABPROT, INR,  in the last 72 hours ABG No results found for this basename: PHART, PCO2, PO2, HCO3,  in the last 72 hours  Studies/Results: No results found.  Anti-infectives: Anti-infectives   Start     Dose/Rate Route Frequency Ordered Stop   02/18/14 0911  vancomycin (VANCOCIN) IVPB 1000 mg/200 mL premix     1,000 mg 200 mL/hr over 60 Minutes Intravenous On call to O.R. 02/18/14 0911 02/18/14 1159      Assessment/Plan: s/p Procedure(s): LAPAROSCOPIC CHOLECYSTECTOMY (N/A)  Discharge home.  LOS: 1 day    Harl Bowie 02/19/2014

## 2014-02-19 NOTE — Telephone Encounter (Signed)
Pharmacist from [facility] called stating this pt received mscontin #90 tabs and oxycodone #150 tabs on 02/10/14, both 30 day supplies. He is questioning if the Rx for Percocet should be filled that was written by Dr. Rush Farmer. Please contact Scott(pharmacist) for permission to fill Rx.

## 2014-02-20 NOTE — Telephone Encounter (Signed)
The prescription should not be filled

## 2014-03-06 ENCOUNTER — Encounter (INDEPENDENT_AMBULATORY_CARE_PROVIDER_SITE_OTHER): Payer: BC Managed Care – PPO | Admitting: Surgery

## 2014-03-27 ENCOUNTER — Other Ambulatory Visit: Payer: Self-pay | Admitting: Internal Medicine

## 2014-03-30 NOTE — Telephone Encounter (Signed)
May I refill her dicyclomine Sir, thank you.

## 2014-03-30 NOTE — Telephone Encounter (Signed)
Refill #[id number] refills

## 2014-04-01 ENCOUNTER — Ambulatory Visit (INDEPENDENT_AMBULATORY_CARE_PROVIDER_SITE_OTHER): Payer: BC Managed Care – PPO | Admitting: Surgery

## 2014-04-01 ENCOUNTER — Encounter (INDEPENDENT_AMBULATORY_CARE_PROVIDER_SITE_OTHER): Payer: Self-pay | Admitting: Surgery

## 2014-04-01 VITALS — BP 132/82 | HR 76 | Temp 97.8°F | Ht 63.0 in | Wt 119.0 lb

## 2014-04-01 DIAGNOSIS — Z09 Encounter for follow-up examination after completed treatment for conditions other than malignant neoplasm: Secondary | ICD-10-CM

## 2014-04-01 NOTE — Progress Notes (Signed)
Subjective:     Patient ID: [name] [name], female   DOB: 04-05-1956, 58 y.o.   MRN: [id number]  HPI She is here for her first postoperative visit status post microscopic cholecystectomy. She reports she is doing very well. Almost all of her preoperative symptoms have completely resolved except for back pain. She is eating well moving her bowels well.  Review of Systems     Objective:   Physical Exam On exam, her incisions are well healed.  The final pathology showed chronic cholecystitis    Assessment:     Patient stable postop     Plan:     She may resume her normal activities. I will see her back as needed

## 2014-05-19 IMAGING — US US ABDOMEN LIMITED
1 series · 14 of 25 positions shown · non-contrast
Comparison: None.

CLINICAL DATA: Right upper quadrant pain

EXAM:
US ABDOMEN LIMITED - RIGHT UPPER QUADRANT

[Series 1: us abdomen limited · 0.22mm/px · 14 of 59 slices shown]
[im 1/59]
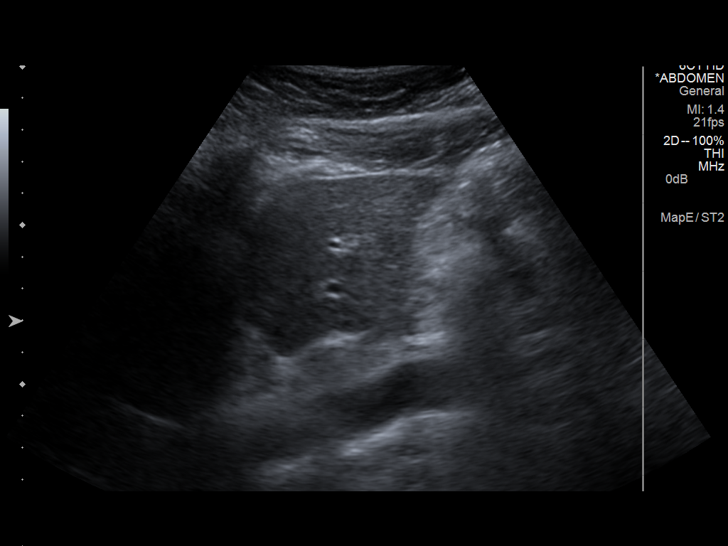
[im 5/59]
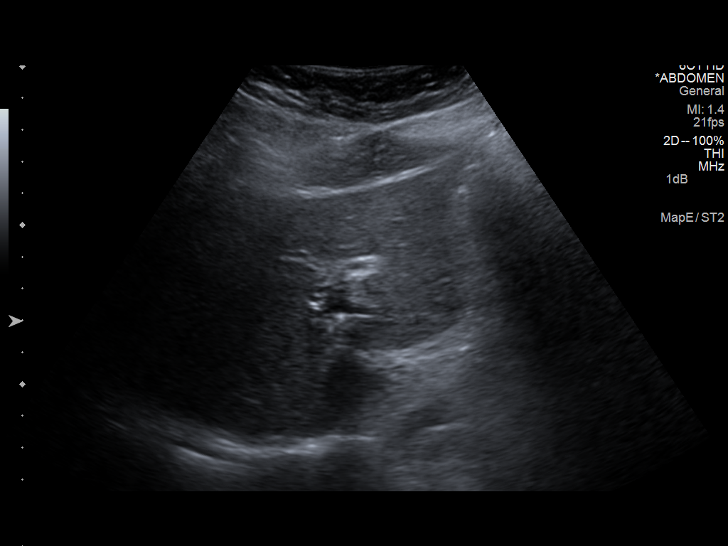
[im 10/59]
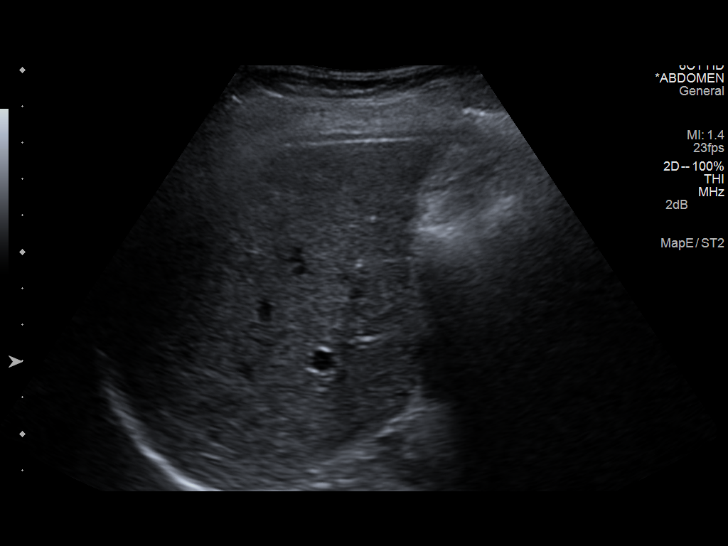
[im 15/59]
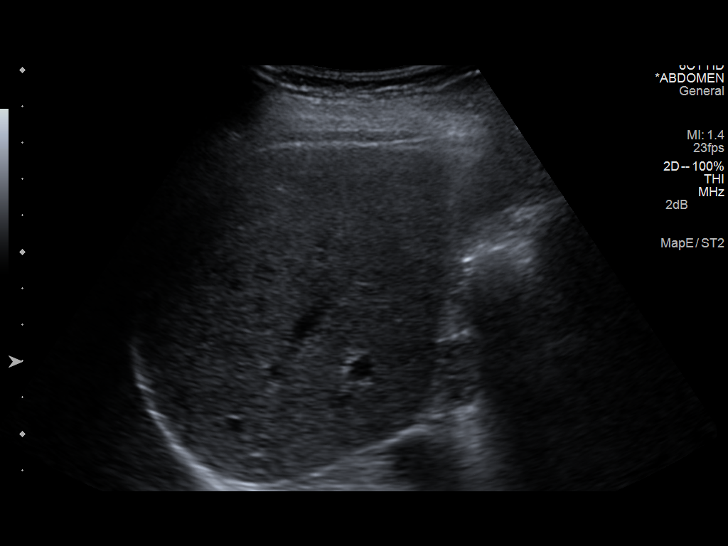
[im 20/59]
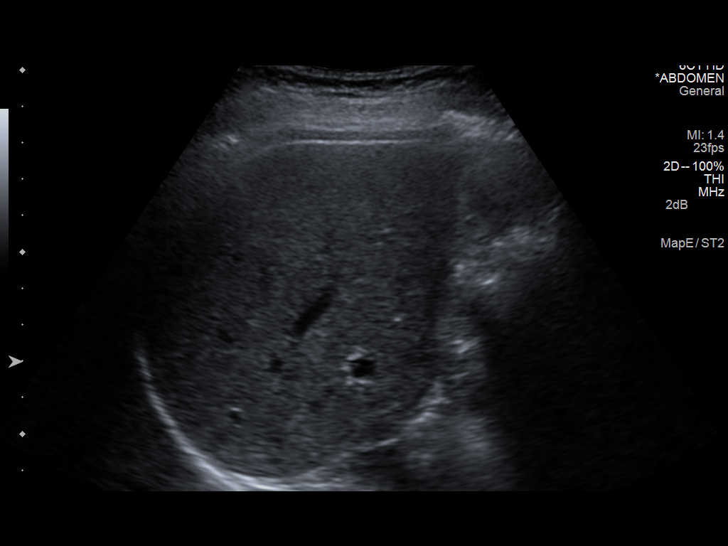
[im 22/59]
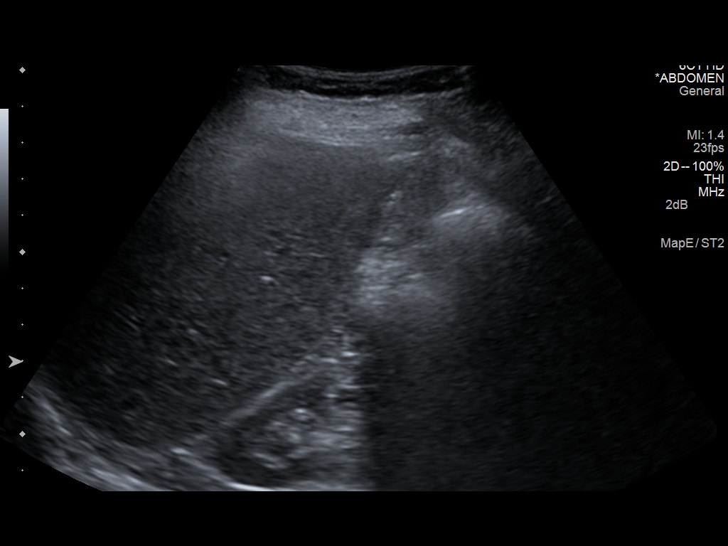
[im 27/59]
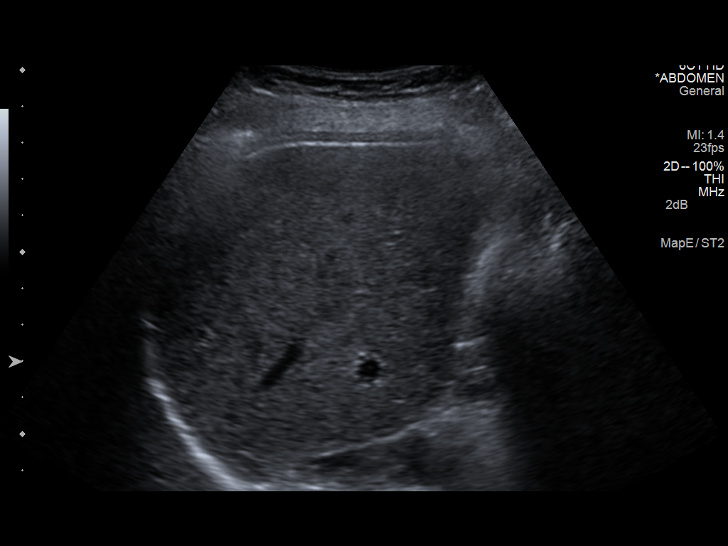
[im 32/59]
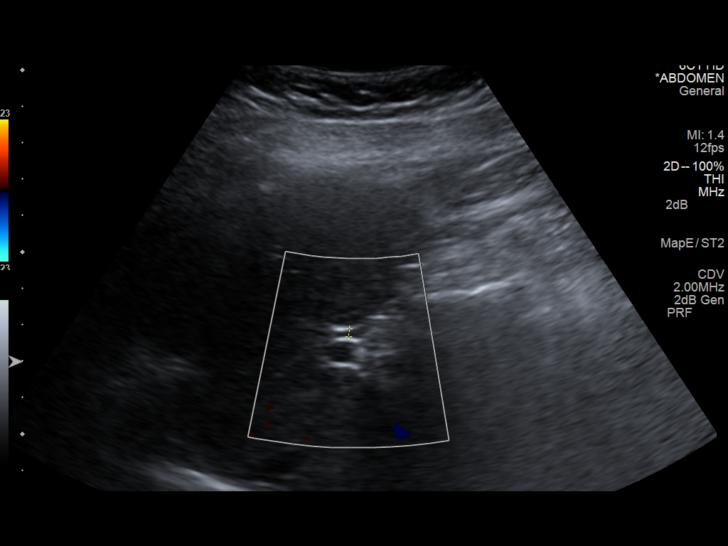
[im 37/59]
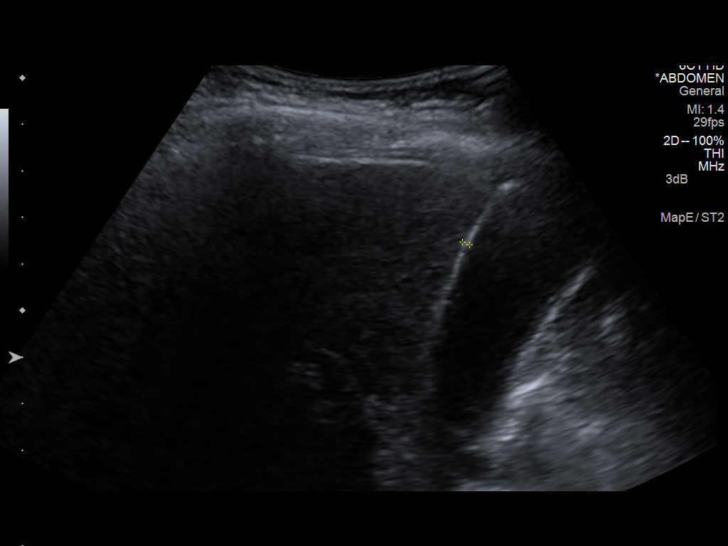
[im 39/59]
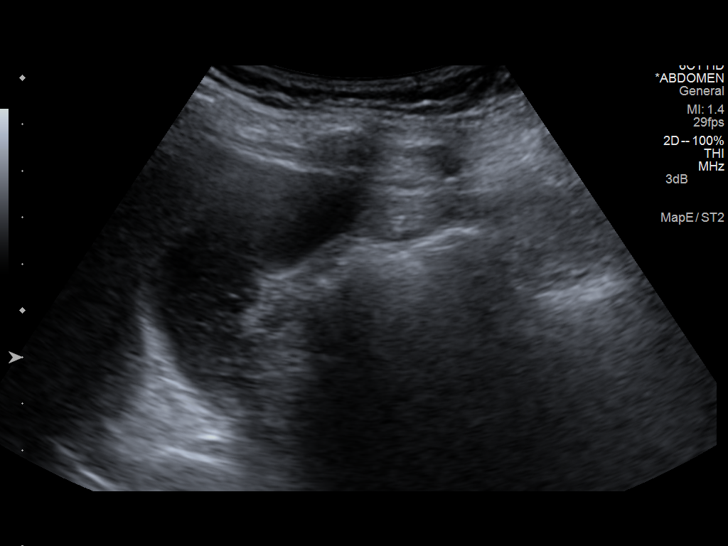
[im 44/59]
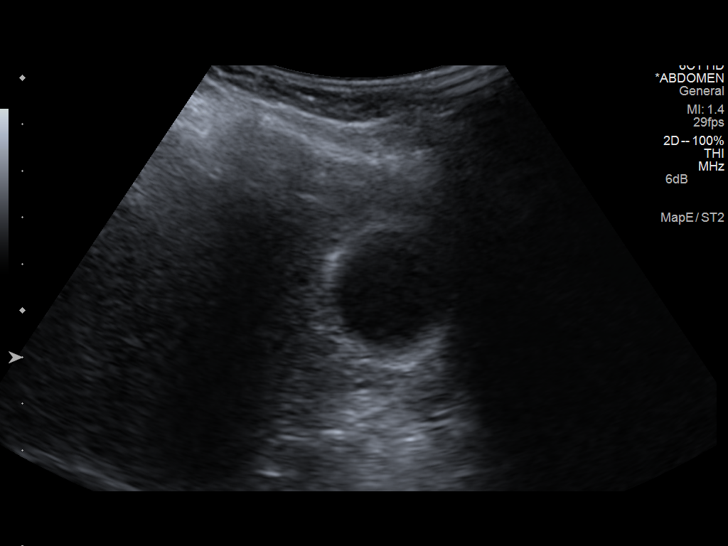
[im 49/59]
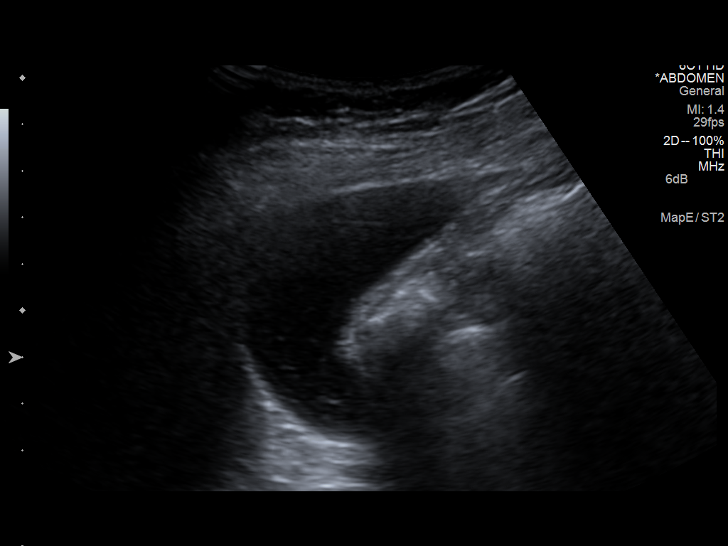
[im 54/59]
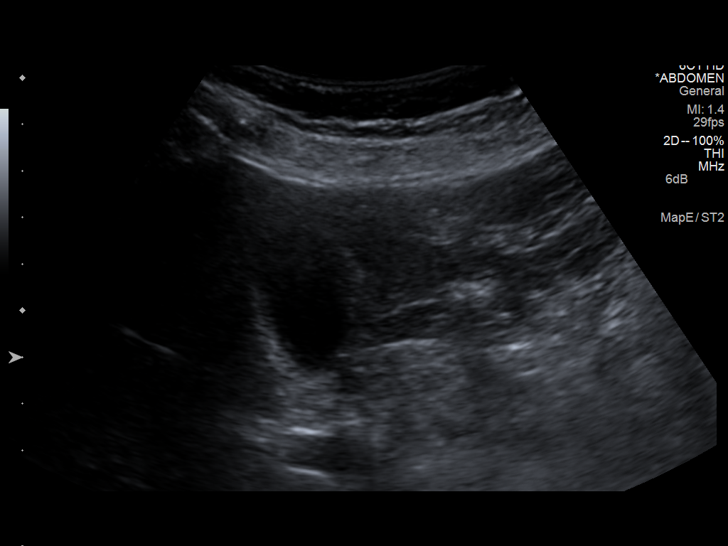
[im 59/59]
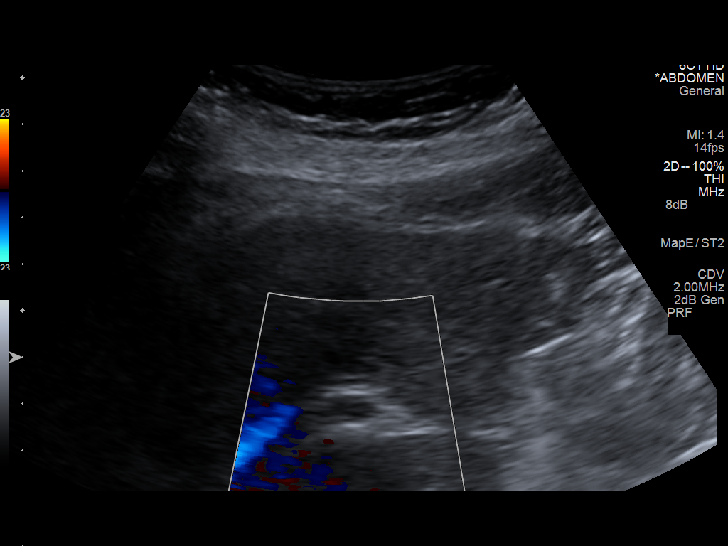

[14 of 25 positions shown; findings below may reference images not displayed]

FINDINGS: Gallbladder:

Sludge identified within the gallbladder. No stones or wall
thickening. Negative sonographic Murphy's sign.

Common bile duct:

Diameter: 2.2 mm

Liver:

No focal lesion identified. Within normal limits in parenchymal
echogenicity.
IMPRESSION: 1. No acute findings.
2. Gallbladder sludge.

## 2014-06-09 ENCOUNTER — Ambulatory Visit: Payer: BC Managed Care – PPO | Admitting: Internal Medicine

## 2014-06-10 ENCOUNTER — Other Ambulatory Visit: Payer: BC Managed Care – PPO

## 2014-06-10 ENCOUNTER — Ambulatory Visit (INDEPENDENT_AMBULATORY_CARE_PROVIDER_SITE_OTHER): Payer: BC Managed Care – PPO | Admitting: Internal Medicine

## 2014-06-10 ENCOUNTER — Encounter: Payer: Self-pay | Admitting: Internal Medicine

## 2014-06-10 VITALS — BP 110/68 | HR 81 | Temp 98.3°F | Resp 13 | Wt 118.8 lb

## 2014-06-10 DIAGNOSIS — R8290 Unspecified abnormal findings in urine: Secondary | ICD-10-CM

## 2014-06-10 DIAGNOSIS — R350 Frequency of micturition: Secondary | ICD-10-CM

## 2014-06-10 DIAGNOSIS — R300 Dysuria: Secondary | ICD-10-CM

## 2014-06-10 DIAGNOSIS — R82998 Other abnormal findings in urine: Secondary | ICD-10-CM

## 2014-06-10 DIAGNOSIS — Z23 Encounter for immunization: Secondary | ICD-10-CM

## 2014-06-10 LAB — POCT URINALYSIS DIPSTICK
Bilirubin, UA: NEGATIVE
Blood, UA: NEGATIVE
Glucose, UA: NEGATIVE
Nitrite, UA: POSITIVE
PH UA: 5.5
Spec Grav, UA: >=1.03

## 2014-06-10 MED ORDER — NITROFURANTOIN MONOHYD MACRO 100 MG PO CAPS: 100.0000 mg | ORAL_CAPSULE | Freq: Two times a day (BID) | ORAL | Status: DC

## 2014-06-10 MED ORDER — PHENAZOPYRIDINE HCL 200 MG PO TABS: 200.0000 mg | ORAL_TABLET | Freq: Three times a day (TID) | ORAL | Status: DC | PRN

## 2014-06-10 NOTE — Progress Notes (Signed)
Pre visit review using our clinic review tool, if applicable. No additional management support is needed unless otherwise documented below in the visit note. 

## 2014-06-10 NOTE — Progress Notes (Signed)
   Subjective:    Patient ID: [name] [name], female    DOB: 07-25-1956, 58 y.o.   MRN: [id number]  HPI   Symptoms began 1 week ago as urinary urgency and bilateral lower flank pain. She also has associated frequency and dysuria. She describes incomplete voiding.  This is associated with generalized fatigue.  Cranberry juice provided some benefit  Significantly she recently took clindamycin from a dentist but developed loose stools and cramping. She stopped this  & was placed on another antibiotic.   She does have residual nausea after meals. Stools remain loose without frank diarrhea or melena or rectal bleeding.   She does have a history of allergy or intolerance to Cipro, ampicillin, sulfa and penicillin    Review of Systems    She specifically denies pyuria or hematuria.  She also has no fever or chills.       Objective:   Physical Exam   Positive or pertinent findings include: She appears pale. Proptosis of the right eye. She has very faint grade 1/2 systolic murmur @ sternal border. She has some tenderness to percussion of the flanks. There is a subcutaneous  device @ the right posterior flank. She has slight tenderness diffusely to palpation of the abdomen.  General appearance :adequately nourished; in no distress. Eyes: No conjunctival inflammation or scleral icterus is present. Oral exam: Dental hygiene is good. Lips and gums are healthy appearing.There is no oropharyngeal erythema or exudate noted.  Heart:  Normal rate and regular rhythm. S1 and S2 normal without gallop, murmur, click, rub or other extra sounds   Lungs:Chest clear to auscultation; no wheezes, rhonchi,rales ,or rubs present.No increased work of breathing.  Abdomen: bowel sounds normal, soft  without masses, organomegaly or hernias noted.  No guarding or rebound.  Skin:Warm & dry.  Intact without suspicious lesions or rashes ; no jaundice or tenting. LS tatoo Lymphatic: No lymphadenopathy is  noted about the head, neck, axilla           Assessment & Plan:  #1 dysuria, probable urinary tract infection  Plan: As per standard of care she'll be placed on nitrofurantoin pending results of the culture and sensitivity.

## 2014-06-10 NOTE — Patient Instructions (Addendum)
Drink as much nondairy fluids as possible. Avoid spicy foods or alcohol as  these may aggravate the bladder. Do not take decongestants. Avoid narcotics if possible.  Please take a probiotic , Florastor OR Align, every day until the bowels are normal. This will replace the normal bacteria which  are necessary for formation of normal stool and processing of food.

## 2014-06-13 LAB — URINE CULTURE: Colony Count: >=100000

## 2014-06-24 ENCOUNTER — Telehealth: Payer: Self-pay | Admitting: Internal Medicine

## 2014-06-24 DIAGNOSIS — R300 Dysuria: Secondary | ICD-10-CM

## 2014-06-24 DIAGNOSIS — R350 Frequency of micturition: Secondary | ICD-10-CM

## 2014-06-24 NOTE — Telephone Encounter (Signed)
Patient notified via voicemail to go to lab. Order has been placed.

## 2014-06-24 NOTE — Telephone Encounter (Signed)
   She needs a repeat clean catch urine for urinalysis and culture and sensitivity to rule out the development of a resistant organism. The antibiotic prescribed previously should have eliminated the infection based on the prior culture results.

## 2014-06-24 NOTE — Telephone Encounter (Signed)
Patient last seen Dr. Linna Darner for a UTI.  She has completed the antibiotic and still has symptoms.  Symptoms include frequency of using the bathroom and once she finishes using the restroom she feels like she still has to urinate and has a burning sensation.  She is requesting more of the antibiotic to be called in to her pharmacy at [facility] on [address] in [place].

## 2014-08-13 ENCOUNTER — Other Ambulatory Visit (INDEPENDENT_AMBULATORY_CARE_PROVIDER_SITE_OTHER): Payer: BC Managed Care – PPO

## 2014-08-13 ENCOUNTER — Ambulatory Visit (INDEPENDENT_AMBULATORY_CARE_PROVIDER_SITE_OTHER): Payer: BC Managed Care – PPO | Admitting: Internal Medicine

## 2014-08-13 VITALS — BP 104/62 | HR 87 | Temp 98.0°F | Resp 12 | Ht 62.0 in | Wt 115.8 lb

## 2014-08-13 DIAGNOSIS — Z0000 Encounter for general adult medical examination without abnormal findings: Secondary | ICD-10-CM

## 2014-08-13 DIAGNOSIS — F988 Other specified behavioral and emotional disorders with onset usually occurring in childhood and adolescence: Secondary | ICD-10-CM

## 2014-08-13 DIAGNOSIS — R110 Nausea: Secondary | ICD-10-CM

## 2014-08-13 DIAGNOSIS — M8580 Other specified disorders of bone density and structure, unspecified site: Secondary | ICD-10-CM

## 2014-08-13 DIAGNOSIS — G894 Chronic pain syndrome: Secondary | ICD-10-CM

## 2014-08-13 DIAGNOSIS — F909 Attention-deficit hyperactivity disorder, unspecified type: Secondary | ICD-10-CM

## 2014-08-13 DIAGNOSIS — K589 Irritable bowel syndrome without diarrhea: Secondary | ICD-10-CM

## 2014-08-13 NOTE — Patient Instructions (Signed)
We will check your blood work today to make sure that you do not have any pancreas problems. We will also check on your liver and your kidneys.  We will see you back in about 6-12 months for a checkup and physical. If you have any problems or questions before then please feel free to contact us.  If you are able you should get another mammogram sometime in the next year.  Stress and Stress Management Stress is a normal reaction to life events. It is what you feel when life demands more than you are used to or more than you can handle. Some stress can be useful. For example, the stress reaction can help you catch the last bus of the day, study for a test, or meet a deadline at work. But stress that occurs too often or for too long can cause problems. It can affect your emotional health and interfere with relationships and normal daily activities. Too much stress can weaken your immune system and increase your risk for physical illness. If you already have a medical problem, stress can make it worse. CAUSES  All sorts of life events may cause stress. An event that causes stress for one person may not be stressful for another person. Major life events commonly cause stress. These may be positive or negative. Examples include losing your job, moving into a new home, getting married, having a baby, or losing a loved one. Less obvious life events may also cause stress, especially if they occur day after day or in combination. Examples include working long hours, driving in traffic, caring for children, being in debt, or being in a difficult relationship. SIGNS AND SYMPTOMS Stress may cause emotional symptoms including, the following:  Anxiety. This is feeling worried, afraid, on edge, overwhelmed, or out of control.  Anger. This is feeling irritated or impatient.  Depression. This is feeling sad, down, helpless, or guilty.  Difficulty focusing, remembering, or making decisions. Stress may cause  physical symptoms, including the following:   Aches and pains. These may affect your head, neck, back, stomach, or other areas of your body.  Tight muscles or clenched jaw.  Low energy or trouble sleeping. Stress may cause unhealthy behaviors, including the following:   Eating to feel better (overeating) or skipping meals.  Sleeping too little, too much, or both.  Working too much or putting off tasks (procrastination).  Smoking, drinking alcohol, or using drugs to feel better. DIAGNOSIS  Stress is diagnosed through an assessment by your health care provider. Your health care provider will ask questions about your symptoms and any stressful life events.Your health care provider will also ask about your medical history and may order blood tests or other tests. Certain medical conditions and medicine can cause physical symptoms similar to stress. Mental illness can cause emotional symptoms and unhealthy behaviors similar to stress. Your health care provider may refer you to a mental health professional for further evaluation.  TREATMENT  Stress management is the recommended treatment for stress.The goals of stress management are reducing stressful life events and coping with stress in healthy ways.  Techniques for reducing stressful life events include the following:  Stress identification. Self-monitor for stress and identify what causes stress for you. These skills may help you to avoid some stressful events.  Time management. Set your priorities, keep a calendar of events, and learn to say "no." These tools can help you avoid making too many commitments. Techniques for coping with stress include the following:  Rethinking the problem. Try to think realistically about stressful events rather than ignoring them or overreacting. Try to find the positives in a stressful situation rather than focusing on the negatives.  Exercise. Physical exercise can release both physical and emotional  tension. The key is to find a form of exercise you enjoy and do it regularly.  Relaxation techniques. These relax the body and mind. Examples include yoga, meditation, tai chi, biofeedback, deep breathing, progressive muscle relaxation, listening to music, being out in nature, journaling, and other hobbies. Again, the key is to find one or more that you enjoy and can do regularly.  Healthy lifestyle. Eat a balanced diet, get plenty of sleep, and do not smoke. Avoid using alcohol or drugs to relax.  Strong support network. Spend time with family, friends, or other people you enjoy being around.Express your feelings and talk things over with someone you trust. Counseling or talktherapy with a mental health professional may be helpful if you are having difficulty managing stress on your own. Medicine is typically not recommended for the treatment of stress.Talk to your health care provider if you think you need medicine for symptoms of stress. HOME CARE INSTRUCTIONS  Keep all follow-up visits as directed by your health care provider.  Take all medicines as directed by your health care provider. SEEK MEDICAL CARE IF:  Your symptoms get worse or you start having new symptoms.  You feel overwhelmed by your problems and can no longer manage them on your own. SEEK IMMEDIATE MEDICAL CARE IF:  You feel like hurting yourself or someone else. Document Released: 03/07/2001 Document Revised: 01/26/2014 Document Reviewed: 05/06/2013 [facility] Patient Information 2015 [place], [place]. This information is not intended to replace advice given to you by your health care provider. Make sure you discuss any questions you have with your health care provider.

## 2014-08-13 NOTE — Progress Notes (Signed)
Pre visit review using our clinic review tool, if applicable. No additional management support is needed unless otherwise documented below in the visit note. 

## 2014-08-14 LAB — COMPREHENSIVE METABOLIC PANEL
ALBUMIN: 4.1 g/dL (ref 3.5–5.2)
ALT: 26 U/L (ref 0–35)
AST: 23 U/L (ref 0–37)
Alkaline Phosphatase: 70 U/L (ref 39–117)
BUN: 12 mg/dL (ref 6–23)
CALCIUM: 8.9 mg/dL (ref 8.4–10.5)
CHLORIDE: 104 meq/L (ref 96–112)
CO2: 31 meq/L (ref 19–32)
CREATININE: 0.9 mg/dL (ref 0.4–1.2)
GFR: 70 mL/min (ref >60.00)
Glucose, Bld: 86 mg/dL (ref 70–99)
POTASSIUM: 4.7 meq/L (ref 3.5–5.1)
Sodium: 140 mEq/L (ref 135–145)
Total Bilirubin: 0.5 mg/dL (ref 0.2–1.2)
Total Protein: 7 g/dL (ref 6.0–8.3)

## 2014-08-14 LAB — LIPID PANEL
Cholesterol: 243 mg/dL — ABNORMAL HIGH (ref 0–200)
HDL: 62 mg/dL (ref >39.00)
LDL Cholesterol: 148 mg/dL — ABNORMAL HIGH (ref 0–99)
NonHDL: 181
Total CHOL/HDL Ratio: 4
Triglycerides: 164 mg/dL — ABNORMAL HIGH (ref 0.0–149.0)
VLDL: 32.8 mg/dL (ref 0.0–40.0)

## 2014-08-14 LAB — LIPASE: LIPASE: 32 U/L (ref 11.0–59.0)

## 2014-08-16 ENCOUNTER — Encounter: Payer: Self-pay | Admitting: Internal Medicine

## 2014-08-16 NOTE — Assessment & Plan Note (Signed)
Feel that her IBS is fairly stable overall. She does occasionally have abnormal bowel movements. Colonoscopy up-to-date from earlier this year without any abnormalities. Check CMP and lipase today.

## 2014-08-16 NOTE — Progress Notes (Signed)
   Subjective:    Patient ID: [name] [name], female    DOB: 03-Jun-1956, 58 y.o.   MRN: [id number]  HPI The patient is a 58 year old female who comes in today to establish care. She has past medical history of osteopenia, osteoarthritis, IBS, depression anxiety, chronic pain. She does go to a pain clinic where she is given multiple medications. Overall she feels stable although has noticed some abdominal pains and wonders about her pancreas having problems. She has also been having some shoulder pain that she associates with sleeping wrong. She denies any other new complaints or problems.  Review of Systems  Constitutional: Positive for fatigue. Negative for fever, activity change and appetite change.  HENT: Negative.   Eyes: Negative.   Respiratory: Negative for cough, chest tightness, shortness of breath and wheezing.   Cardiovascular: Negative for chest pain, palpitations and leg swelling.  Gastrointestinal: Positive for abdominal pain. Negative for diarrhea, constipation, blood in stool and abdominal distention.  Musculoskeletal: Positive for back pain and arthralgias. Negative for myalgias and gait problem.  Skin: Negative.   Neurological: Negative for dizziness, weakness, light-headedness and headaches.      Objective:   Physical Exam  Constitutional: She is oriented to person, place, and time. She appears well-developed and well-nourished.  HENT:  Head: Normocephalic and atraumatic.  Eyes: EOM are normal.  Neck: Normal range of motion.  Cardiovascular: Normal rate and regular rhythm.   Pulmonary/Chest: Effort normal and breath sounds normal.  Abdominal: Soft. Bowel sounds are normal. She exhibits no distension. There is no tenderness. There is no rebound.  Neurological: She is alert and oriented to person, place, and time. Coordination normal.  Skin: Skin is warm and dry.   Filed Vitals:   08/13/14 1608  BP: 104/62  Pulse: 87  Temp: 98 F (36.7 C)  TempSrc: Oral  Resp: 12    Height: 5\' 2"  (1.575 m)  Weight: 115 lb 12.8 oz (52.527 kg)  SpO2: 96%      Assessment & Plan:

## 2014-08-16 NOTE — Assessment & Plan Note (Signed)
She does go to the pain clinic for her medications, she is on quite a few sedating medications. Spoke with her about the cause of her pain and she was unable to clarify exactly where all of her pain is coming from but she does have a lot of pain. She also has multiple sensitivities to medications making trial of different agents difficult.

## 2014-08-16 NOTE — Assessment & Plan Note (Signed)
Continue to encourage weightbearing activities, calcium and vitamin D supplementation. Will check records for when last DEXA scan was May need repeat.

## 2014-08-16 NOTE — Assessment & Plan Note (Signed)
Last colonoscopy was this year, she is overdue for mammogram and spoke with her about that. She states that they're fairly painful and she is not sure she wants to have another mammogram. She is caught up-to-date on immunizations.

## 2014-08-16 NOTE — Assessment & Plan Note (Signed)
It is unclear to me whether she has ADD. Spoke with her about the fact that her Adderall is a stimulant medication which can be very dangerous as you get older. Recommended she speak with her psychiatrist about trialing stimulant sparing agents or utilizing medication only when absolutely necessary.

## 2014-09-16 ENCOUNTER — Other Ambulatory Visit: Payer: Self-pay | Admitting: Geriatric Medicine

## 2014-09-16 MED ORDER — PROMETHAZINE HCL 25 MG RE SUPP: 25.0000 mg | Freq: Four times a day (QID) | RECTAL | Status: DC | PRN

## 2015-02-23 ENCOUNTER — Telehealth: Payer: Self-pay | Admitting: Geriatric Medicine

## 2015-02-23 NOTE — Telephone Encounter (Signed)
Left message for patient to call me back to find out if she has had a recent mammogram.

## 2015-07-23 ENCOUNTER — Other Ambulatory Visit: Payer: Self-pay | Admitting: Internal Medicine

## 2015-12-02 ENCOUNTER — Ambulatory Visit: Payer: BC Managed Care – PPO | Admitting: Family

## 2016-02-09 DIAGNOSIS — M533 Sacrococcygeal disorders, not elsewhere classified: Secondary | ICD-10-CM | POA: Insufficient documentation

## 2016-04-11 ENCOUNTER — Ambulatory Visit: Payer: BC Managed Care – PPO | Admitting: Internal Medicine

## 2016-11-27 ENCOUNTER — Other Ambulatory Visit: Payer: Self-pay | Admitting: Internal Medicine

## 2017-06-19 ENCOUNTER — Emergency Department (HOSPITAL_COMMUNITY)
Admission: EM | Admit: 2017-06-19 | Discharge: 2017-06-19 | Disposition: A | Discharge status: Home / Self Care | Payer: Medicaid Other | Attending: Emergency Medicine | Admitting: Emergency Medicine

## 2017-06-19 ENCOUNTER — Emergency Department (HOSPITAL_COMMUNITY): Payer: Medicaid Other

## 2017-06-19 ENCOUNTER — Encounter (HOSPITAL_COMMUNITY): Payer: Self-pay

## 2017-06-19 DIAGNOSIS — Y9301 Activity, walking, marching and hiking: Secondary | ICD-10-CM | POA: Diagnosis not present

## 2017-06-19 DIAGNOSIS — W010XXA Fall on same level from slipping, tripping and stumbling without subsequent striking against object, initial encounter: Secondary | ICD-10-CM | POA: Insufficient documentation

## 2017-06-19 DIAGNOSIS — Y92009 Unspecified place in unspecified non-institutional (private) residence as the place of occurrence of the external cause: Secondary | ICD-10-CM | POA: Insufficient documentation

## 2017-06-19 DIAGNOSIS — Z91040 Latex allergy status: Secondary | ICD-10-CM | POA: Insufficient documentation

## 2017-06-19 DIAGNOSIS — Z79899 Other long term (current) drug therapy: Secondary | ICD-10-CM | POA: Insufficient documentation

## 2017-06-19 DIAGNOSIS — S82842A Displaced bimalleolar fracture of left lower leg, initial encounter for closed fracture: Secondary | ICD-10-CM | POA: Insufficient documentation

## 2017-06-19 DIAGNOSIS — S99912A Unspecified injury of left ankle, initial encounter: Secondary | ICD-10-CM | POA: Diagnosis present

## 2017-06-19 DIAGNOSIS — T148XXA Other injury of unspecified body region, initial encounter: Secondary | ICD-10-CM

## 2017-06-19 DIAGNOSIS — Y999 Unspecified external cause status: Secondary | ICD-10-CM | POA: Insufficient documentation

## 2017-06-19 MED ORDER — HYDROMORPHONE HCL 1 MG/ML IJ SOLN
1.0000 mg | Freq: Once | INTRAMUSCULAR | Status: AC
Start: 2017-06-19 — End: 2017-06-19
  Administered 2017-06-19: 1 mg via INTRAVENOUS
  Filled 2017-06-19: qty 1

## 2017-06-19 MED ORDER — PROPOFOL 10 MG/ML IV BOLUS
0.5000 mg/kg | INTRAVENOUS | Status: DC | PRN
  Administered 2017-06-19: 36.3 mg via INTRAVENOUS
  Filled 2017-06-19: qty 20

## 2017-06-19 MED ORDER — PROPOFOL 10 MG/ML IV BOLUS
40.0000 mg | Freq: Once | INTRAVENOUS | Status: AC
  Administered 2017-06-19: 40 mg via INTRAVENOUS

## 2017-06-19 MED ORDER — MORPHINE SULFATE (PF) 4 MG/ML IV SOLN
4.0000 mg | Freq: Once | INTRAVENOUS | Status: AC
  Administered 2017-06-19: 4 mg via INTRAVENOUS
  Filled 2017-06-19: qty 1

## 2017-06-19 MED ORDER — ONDANSETRON HCL 4 MG/2ML IJ SOLN
4.0000 mg | Freq: Once | INTRAMUSCULAR | Status: AC
  Administered 2017-06-19: 4 mg via INTRAVENOUS
  Filled 2017-06-19: qty 2

## 2017-06-19 NOTE — Sedation Documentation (Signed)
Reduction completed. Pt alert.

## 2017-06-19 NOTE — ED Triage Notes (Signed)
Pt reports fell on a wet ramp at home.  Deformity and bruising noted to left ankle.  Pt also reports lower back pain but says that pain is not new.  Pedal pulse present.

## 2017-06-19 NOTE — ED Notes (Signed)
Pt refused crutches, family member states she has some that she can use

## 2017-06-19 NOTE — Discharge Instructions (Signed)
Do not apply any weight on your ankle.  Use crutches for support.  Follow up with Dr Aline Brochure for further treatment.  Call to set up an appointment

## 2017-06-19 NOTE — ED Provider Notes (Signed)
[facility] DEPT Provider Note   CSN: [id number] Arrival date & time: 06/19/17  1254     History   Chief Complaint Chief Complaint  Patient presents with  . Fall    HPI Jozi Stanzione is a 61 y.o. female.  HPI atient presents to the emergency room for evaluation of a left ankle injury. Patient was walking at home up a ramp when she slipped and fell injuring her left ankle.   Her ankle is grossly deformed and she is unable to stand or bear weight. Patient has some pain up towards her knee but denies any other injuries. She did not lose consciousness. No numbness or weakness. Past Medical History:  Diagnosis Date  . ABDOMINAL PAIN, LOWER 02/16/2009  . APPENDECTOMY, HX OF 09/11/2007  . ATTENTION DEFICIT DISORDER, ADULT 02/02/2009  . BACK PAIN 07/16/2007   chronic  . CERVICAL RADICULOPATHY, RIGHT 07/16/2007  . CHEST PAIN, ACUTE 09/03/2008  . Chronic abdominal pain   . Chronic pain syndrome 09/11/2007  . Complication of anesthesia    woke up during surgeries few times and woke up during endoscopy and colonscopy few weeks ago, in terrible pain  . DEPRESSION/ANXIETY 06/17/2010  . Dysuria 02/20/2008  . Elevated liver enzymes 1 and  1/2 months ago  . Family history of anesthesia complication    mother woke up during surgeries  . Fibromyalgia   . Functional GI symptoms   . FX, RAMUS NOS, CLOSED 07/16/2007  . GERD (gastroesophageal reflux disease)   . H/O failed conscious sedation    PT STATES SHE IS HARD TO SEDATE!  . HEMORRHOIDS 09/11/2007  . HIATAL HERNIA, HX OF 09/11/2007  . HX, PERSONAL, MUSCULOSKELETAL DISORD NEC 07/16/2007  . HX, PERSONAL, URINARY CALCULI 07/16/2007  . Irritable bowel syndrome 09/11/2007  . NEOPLASM, SKIN, UNCERTAIN BEHAVIOR 1/0/2585  . OSTEOARTHRITIS 09/11/2007   oa  . OSTEOPENIA 10/23/2007  . PONV (postoperative nausea and vomiting)   . ROTATOR CUFF REPAIR, HX OF 09/11/2007  . Suicidal ideations   . SYMPTOM, PAIN, ABDOMINAL, RIGHT UP QUADRANT  07/16/2007  . TAH/BSO, HX OF 09/11/2007  . UTI 10/06/2010  . VAGINITIS, ATROPHIC, POSTMENOPAUSAL 07/16/2007    Patient Active Problem List   Diagnosis Date Noted  . Insomnia 02/21/2012  . Routine health maintenance 07/25/2011  . DEPRESSION/ANXIETY 06/17/2010  . Attention deficit disorder 02/02/2009  . Osteopenia 10/23/2007  . CHRONIC PAIN SYNDROME 09/11/2007  . HEMORRHOIDS 09/11/2007  . IRRITABLE BOWEL SYNDROME 09/11/2007  . OSTEOARTHRITIS 09/11/2007    Past Surgical History:  Procedure Laterality Date  . ABDOMINAL HYSTERECTOMY    . APPENDECTOMY    . bone pushed back into  place after fracture Left 25-30 yrs ago   face  . cervical radiculopathy  20 yrs ago   RT  . CHOLECYSTECTOMY N/A 02/18/2014   Procedure: LAPAROSCOPIC CHOLECYSTECTOMY;  Surgeon: Harl Bowie, MD;  Location: [facility];  Service: General;  Laterality: N/A;  . COLONOSCOPY    . DILATION AND CURETTAGE OF UTERUS  age 81  . ESOPHAGOGASTRODUODENOSCOPY    . ROTATOR CUFF REPAIR     right  . SPINAL CORD STIMULATOR IMPLANT  6-7 yrs ago   lower back, pt turned off 2-3 weeks ago due to causing pain  . TOTAL ABDOMINAL HYSTERECTOMY W/ BILATERAL SALPINGOOPHORECTOMY    . TUBAL LIGATION      OB History    No data available       Home Medications    Prior to Admission medications   Medication Sig  Start Date End Date Taking? Authorizing Provider  amphetamine-dextroamphetamine (ADDERALL) 30 MG tablet 30 mg. Take one half to one tablet by mouth once daily   Yes [provider]  cyclobenzaprine (FLEXERIL) 10 MG tablet Take 10 mg by mouth at bedtime.  03/23/17  Yes [provider]  diclofenac sodium (VOLTAREN) 1 % GEL Apply 4 g topically 4 (four) times daily.  11/23/16  Yes [provider]  Docusate Sodium (DULCOLAX STOOL SOFTENER PO) Take 300 mg by mouth daily.    Yes [provider]  ibuprofen (ADVIL) 200 MG tablet 3 tablets daily as needed.   Yes [provider]    ondansetron (ZOFRAN) 8 MG tablet Take 1 tablet (8 mg total) by mouth every 4 (four) hours as needed for nausea or vomiting. 02/02/14  Yes Gatha Mayer, MD  oxyCODONE (ROXICODONE) 15 MG immediate release tablet Take 15 mg by mouth every 6 (six) hours as needed for pain.   Yes [provider]  polyethylene glycol powder (GLYCOLAX/MIRALAX) powder Take 17 g by mouth daily as needed for mild constipation or moderate constipation.   Yes [provider]  promethazine (PHENERGAN) 25 MG suppository UNWRAP AND INSERT ONE SUPPOSITORY RECTALLY EVERY 6 HOURS AS NEEDED FOR NAUSEA. 07/26/15  Yes Hoyt Koch, MD  venlafaxine XR (EFFEXOR-XR) 150 MG 24 hr capsule Take 300 mg by mouth daily with breakfast.    Yes [provider]  zolpidem (AMBIEN) 5 MG tablet Take 10 mg by mouth at bedtime as needed for sleep.   Yes [provider]    Family History Family History  Problem Relation Age of Onset  . Heart disease Mother        CAD/ valvular disease  . Kidney disease Mother        lost a kidney -  unknown cause  . Heart disease Brother        CAD  . COPD Father   . Heart disease Father 21       MI  . Heart disease Sister   . Cancer Other        Colon  . Heart disease Maternal Aunt   . Heart disease Maternal Grandmother   . Colon cancer Cousin     Social History Social History  Substance Use Topics  . Smoking status: Never Smoker  . Smokeless tobacco: Never Used  . Alcohol use No     Allergies   Ciprofloxacin; Ampicillin; Atorvastatin; Buspirone hcl; Ezetimibe-simvastatin; Hydrocodone bitartrate er; Latex; Lyrica [pregabalin]; Penicillins; and Sulfa antibiotics   Review of Systems Review of Systems  All other systems reviewed and are negative.    Physical Exam Updated Vital Signs BP 120/79 (BP Location: Left Arm)   Pulse (!) 102   Temp 98.3 F (36.8 C) (Oral)   Resp 18   Ht 1.6 m (5\' 3" )   Wt 72.6 kg (160 lb)   SpO2 100%   BMI 28.34  kg/m   Physical Exam  Constitutional: She appears well-developed and well-nourished. No distress.  HENT:  Head: Normocephalic and atraumatic.  Right Ear: External ear normal.  Left Ear: External ear normal.  Eyes: Conjunctivae are normal. Right eye exhibits no discharge. Left eye exhibits no discharge. No scleral icterus.  Neck: Neck supple. No tracheal deviation present.  Cardiovascular: Normal rate.   Pulmonary/Chest: Effort normal. No stridor. No respiratory distress.  Abdominal: She exhibits no distension.  Musculoskeletal: She exhibits tenderness and deformity. She exhibits no edema.  Right shoulder: Normal.       Left shoulder: Normal.       Right wrist: Normal.       Left wrist: Normal.       Right hip: Normal.       Left hip: Normal.       Left knee: Normal.       Left ankle: She exhibits decreased range of motion, swelling and deformity. Tenderness. Lateral malleolus, medial malleolus and proximal fibula tenderness found. No head of 5th metatarsal tenderness found.  Neurological: She is alert. Cranial nerve deficit: no gross deficits.  Skin: Skin is warm and dry. No rash noted.  Psychiatric: She has a normal mood and affect.  Nursing note and vitals reviewed.    ED Treatments / Results  Labs (all labs ordered are listed, but only abnormal results are displayed) Labs Reviewed - No data to display   Radiology Dg Knee 2 Views Left  Result Date: 06/19/2017 CLINICAL DATA:  Status post fall with left knee pain. EXAM: LEFT KNEE - 1-2 VIEW COMPARISON:  None. FINDINGS: No evidence of fracture, dislocation, or joint effusion. Minimal tibial spine osteophytosis noted. Soft tissues are unremarkable. IMPRESSION: No acute fracture or dislocation. Electronically Signed   By: Abelardo Diesel M.D.   On: 06/19/2017 14:38   Dg Ankle Complete Left  Result Date: 06/19/2017 CLINICAL DATA:  Fall.  Pain and deformity. EXAM: LEFT ANKLE COMPLETE - 3+ VIEW COMPARISON:  Left ankle series  715 2005. FINDINGS: Angulated displaced fracture of the medial malleolus. Fracture noted along the anterior aspect of the distal tibia . Comminuted angulated displaced fracture of the distal fibula. Tibiotalar joint disruption noted . IMPRESSION: Displaced fractures of the medial malleolus and lateral fibula. Fracture also noted along the anterior aspect of the tibia. Tibial talar joint disruption noted . Electronically Signed   By: Marcello Moores  Register   On: 06/19/2017 13:20    Procedures .Sedation Date/Time: 06/19/2017 3:34 PM Performed by: Dorie Rank Authorized by: Dorie Rank   Consent:    Consent obtained:  Written   Consent given by:  Patient   Risks discussed:  Allergic reaction, dysrhythmia, inadequate sedation, nausea, prolonged hypoxia resulting in organ damage, prolonged sedation necessitating reversal, respiratory compromise necessitating ventilatory assistance and intubation and vomiting   Alternatives discussed:  Analgesia without sedation Universal protocol:    Procedure explained and questions answered to patient or proxy's satisfaction: yes     Relevant documents present and verified: yes     Test results available and properly labeled: yes     Imaging studies available: yes     Required blood products, implants, devices, and special equipment available: yes     Site/side marked: yes     Immediately prior to procedure a time out was called: yes     Patient identity confirmation method:  Verbally with patient Indications:    Procedure performed:  Fracture reduction   Procedure necessitating sedation performed by:  Physician performing sedation   Intended level of sedation:  Deep Pre-sedation assessment:    Time since last food or drink:  12   ASA classification: class 1 - normal, healthy patient     Neck mobility: normal     Mouth opening:  3 or more finger widths   Thyromental distance:  4 finger widths   Mallampati score:  II - soft palate, uvula, fauces visible    Pre-sedation assessments completed and reviewed: airway patency, cardiovascular function, hydration status, mental status, nausea/vomiting, pain level,  respiratory function and temperature   Immediate pre-procedure details:    Reassessment: Patient reassessed immediately prior to procedure     Reviewed: vital signs, relevant labs/tests and NPO status     Verified: emergency equipment available, intubation equipment available, IV patency confirmed, oxygen available, reversal medications available and suction available   Procedure details (see MAR for exact dosages):    Preoxygenation:  Nasal cannula   Sedation:  Propofol   Intra-procedure monitoring:  Blood pressure monitoring, cardiac monitor, continuous capnometry, continuous pulse oximetry, frequent LOC assessments and frequent vital sign checks   Intra-procedure events: none     Total Provider sedation time (minutes):  25 Post-procedure details:    Post-sedation assessment completed:  06/19/2017 3:35 PM   Attendance: Constant attendance by certified staff until patient recovered     Recovery: Patient returned to pre-procedure baseline     Post-sedation assessments completed and reviewed: airway patency, cardiovascular function, hydration status, mental status, nausea/vomiting, pain level, respiratory function and temperature     Patient is stable for discharge or admission: yes     Patient tolerance:  Tolerated well, no immediate complications Reduction of fracture Date/Time: 06/19/2017 3:36 PM Performed by: Dorie Rank Authorized by: Dorie Rank  Consent: Verbal consent obtained. Written consent obtained. Risks and benefits: risks, benefits and alternatives were discussed Consent given by: patient Patient understanding: patient states understanding of the procedure being performed Patient consent: the patient's understanding of the procedure matches consent given Procedure consent: procedure consent matches procedure scheduled Relevant  documents: relevant documents present and verified Test results: test results available and properly labeled Site marked: the operative site was marked Imaging studies: imaging studies available Patient identity confirmed: verbally with patient Time out: Immediately prior to procedure a "time out" was called to verify the correct patient, procedure, equipment, support staff and site/side marked as required.  Sedation: Patient sedated: yes Patient tolerance: Patient tolerated the procedure well with no immediate complications Comments: Fracture reduced manually and splint applied by nursing under my direct guidance    (including critical care time)  Medications Ordered in ED Medications  propofol (DIPRIVAN) 10 mg/mL bolus/IV push 36.3 mg (36.3 mg Intravenous Given 06/19/17 1522)  morphine 4 MG/ML injection 4 mg (not administered)  HYDROmorphone (DILAUDID) injection 1 mg (1 mg Intravenous Given 06/19/17 1355)  ondansetron (ZOFRAN) injection 4 mg (4 mg Intravenous Given 06/19/17 1355)  propofol (DIPRIVAN) 10 mg/mL bolus/IV push 40 mg (40 mg Intravenous Given 06/19/17 1525)     Initial Impression / Assessment and Plan / ED Course  I have reviewed the triage vital signs and the nursing notes.  Pertinent labs & imaging results that were available during my care of the patient were reviewed by me and considered in my medical decision making (see chart for details).  Clinical Course as of Jun 19 1538  Tue Jun 19, 2017  1453 Discussed Dr Aline Brochure.  I will reduce the fracture and splint.  Pt will follow up and see him in the office.  [JK]    Clinical Course User Index [JK] Dorie Rank, MD    Patient presented to the emergency room with an ankle fracture. Fracture was reduced under sedation. Patient tolerated the procedure well. Plan is for discharge with outpatient follow-up with Dr. Aline Brochure  Final Clinical Impressions(s) / ED Diagnoses   Final diagnoses:  Ankle fracture, bimalleolar,  closed, left, initial encounter    New Prescriptions New Prescriptions   No medications on file     Dorie Rank, MD  06/19/17 2209  

## 2017-06-20 ENCOUNTER — Encounter (HOSPITAL_COMMUNITY): Payer: Self-pay

## 2017-06-20 ENCOUNTER — Encounter: Payer: Self-pay | Admitting: Orthopedic Surgery

## 2017-06-20 ENCOUNTER — Ambulatory Visit (INDEPENDENT_AMBULATORY_CARE_PROVIDER_SITE_OTHER): Payer: Medicaid Other | Admitting: Orthopedic Surgery

## 2017-06-20 ENCOUNTER — Ambulatory Visit (HOSPITAL_COMMUNITY)
Admission: RE | Admit: 2017-06-20 | Discharge: 2017-06-20 | Disposition: A | Discharge status: Home / Self Care | Payer: Medicaid Other | Source: Ambulatory Visit | Attending: Orthopedic Surgery | Admitting: Orthopedic Surgery

## 2017-06-20 ENCOUNTER — Inpatient Hospital Stay (HOSPITAL_COMMUNITY)
Admission: RE | Admit: 2017-06-20 | Discharge: 2017-06-20 | Disposition: A | Discharge status: Home / Self Care | Payer: Medicaid Other | Source: Ambulatory Visit

## 2017-06-20 VITALS — BP 141/84 | HR 95

## 2017-06-20 DIAGNOSIS — S82852A Displaced trimalleolar fracture of left lower leg, initial encounter for closed fracture: Secondary | ICD-10-CM | POA: Diagnosis not present

## 2017-06-20 DIAGNOSIS — X58XXXA Exposure to other specified factors, initial encounter: Secondary | ICD-10-CM | POA: Diagnosis not present

## 2017-06-20 DIAGNOSIS — R609 Edema, unspecified: Secondary | ICD-10-CM | POA: Diagnosis not present

## 2017-06-20 DIAGNOSIS — G894 Chronic pain syndrome: Secondary | ICD-10-CM

## 2017-06-20 DIAGNOSIS — S82892A Other fracture of left lower leg, initial encounter for closed fracture: Secondary | ICD-10-CM

## 2017-06-20 DIAGNOSIS — M7732 Calcaneal spur, left foot: Secondary | ICD-10-CM | POA: Diagnosis not present

## 2017-06-20 MED ORDER — OXYCODONE HCL 20 MG PO TABS: 1.0000 | ORAL_TABLET | ORAL | 0 refills | Status: DC | PRN

## 2017-06-20 NOTE — Progress Notes (Addendum)
NEW PATIENT OFFICE VISIT    Chief Complaint  Patient presents with  . Follow-up    Left ankle fracture DOI 06/19/17    61 year old female fell on September 25 at her home. She has chronic pain syndrome, she has an unknown or nonfunctioning spinal stimulator. She is on oxycodone 15 mg every 6 hours when necessary for pain and sees a pain management clinic specialist  She sustained a trimalleolar fracture dislocation of her left ankle which was close reduced successfully in emergency room the day of injury.  She comes in complaining of severe pain throbbing numbness in her toes with dull stabbing aching severe pain which is constant seems to be worse if she hangs her foot down associated with swelling all involving the left ankle. She does not have any other complaints of pain or injury    Review of Systems  Constitutional: Negative for chills and fever.  HENT: Negative for hearing loss.   Eyes: Negative for blurred vision.  Respiratory: Negative for shortness of breath.   Cardiovascular: Negative for chest pain.  Musculoskeletal: Positive for back pain and neck pain.  Skin: Negative for rash.  Neurological: Positive for tingling and sensory change. Negative for focal weakness.  Endo/Heme/Allergies: Negative for polydipsia. Does not bruise/bleed easily.  Psychiatric/Behavioral: Negative for memory loss and substance abuse.  All other systems reviewed and are negative.    Past Medical History:  Diagnosis Date  . ABDOMINAL PAIN, LOWER 02/16/2009  . APPENDECTOMY, HX OF 09/11/2007  . ATTENTION DEFICIT DISORDER, ADULT 02/02/2009  . BACK PAIN 07/16/2007   chronic  . CERVICAL RADICULOPATHY, RIGHT 07/16/2007  . CHEST PAIN, ACUTE 09/03/2008  . Chronic abdominal pain   . Chronic pain syndrome 09/11/2007  . Complication of anesthesia    woke up during surgeries few times and woke up during endoscopy and colonscopy few weeks ago, in terrible pain  . DEPRESSION/ANXIETY 06/17/2010  .  Dysuria 02/20/2008  . Elevated liver enzymes 1 and  1/2 months ago  . Family history of anesthesia complication    mother woke up during surgeries  . Fibromyalgia   . Functional GI symptoms   . FX, RAMUS NOS, CLOSED 07/16/2007  . GERD (gastroesophageal reflux disease)   . H/O failed conscious sedation    PT STATES SHE IS HARD TO SEDATE!  . HEMORRHOIDS 09/11/2007  . HIATAL HERNIA, HX OF 09/11/2007  . HX, PERSONAL, MUSCULOSKELETAL DISORD NEC 07/16/2007  . HX, PERSONAL, URINARY CALCULI 07/16/2007  . Irritable bowel syndrome 09/11/2007  . NEOPLASM, SKIN, UNCERTAIN BEHAVIOR 0/02/3015  . OSTEOARTHRITIS 09/11/2007   oa  . OSTEOPENIA 10/23/2007  . PONV (postoperative nausea and vomiting)   . ROTATOR CUFF REPAIR, HX OF 09/11/2007  . Suicidal ideations   . SYMPTOM, PAIN, ABDOMINAL, RIGHT UP QUADRANT 07/16/2007  . TAH/BSO, HX OF 09/11/2007  . UTI 10/06/2010  . VAGINITIS, ATROPHIC, POSTMENOPAUSAL 07/16/2007    Past Surgical History:  Procedure Laterality Date  . ABDOMINAL HYSTERECTOMY    . APPENDECTOMY    . bone pushed back into  place after fracture Left 25-30 yrs ago   face  . cervical radiculopathy  20 yrs ago   RT  . CHOLECYSTECTOMY N/A 02/18/2014   Procedure: LAPAROSCOPIC CHOLECYSTECTOMY;  Surgeon: Harl Bowie, MD;  Location: [facility];  Service: General;  Laterality: N/A;  . COLONOSCOPY    . DILATION AND CURETTAGE OF UTERUS  age 21  . ESOPHAGOGASTRODUODENOSCOPY    . ROTATOR CUFF REPAIR     right  .  SPINAL CORD STIMULATOR IMPLANT  6-7 yrs ago   lower back, pt turned off 2-3 weeks ago due to causing pain  . TOTAL ABDOMINAL HYSTERECTOMY W/ BILATERAL SALPINGOOPHORECTOMY    . TUBAL LIGATION      Family History  Problem Relation Age of Onset  . Heart disease Mother        CAD/ valvular disease  . Kidney disease Mother        lost a kidney -  unknown cause  . Heart disease Brother        CAD  . COPD Father   . Heart disease Father 25       MI  . Heart disease Sister   .  Cancer Other        Colon  . Heart disease Maternal Aunt   . Heart disease Maternal Grandmother   . Colon cancer Cousin    Social History  Substance Use Topics  . Smoking status: Never Smoker  . Smokeless tobacco: Never Used  . Alcohol use No    BP (!) 141/84   Pulse 95 respiratory rate 18  Physical Exam  Constitutional: She is oriented to person, place, and time. She appears well-developed and well-nourished.  Non-toxic appearance. She appears distressed.  Eyes: Conjunctivae, EOM and lids are normal. Right eye exhibits no discharge and no exudate. Left eye exhibits no discharge and no exudate. Right conjunctiva is not injected. Left conjunctiva is not injected.  Neck: Trachea normal and full passive range of motion without pain. Neck supple. No neck rigidity. No tracheal deviation and normal range of motion present. No thyroid mass and no thyromegaly present.  Cardiovascular: Intact distal pulses.   Pulses:      Radial pulses are 2+ on the right side, and 2+ on the left side.       Dorsalis pedis pulses are 2+ on the right side, and 2+ on the left side.  Musculoskeletal:  Right and left upper extremity normal range of motion stability strength alignment skin pulse without lymphadenopathy sensory deficit pathologic reflexes and normal coordination  Right lower extremity normal range of motion strength stability alignment skin pulse no lymphadenopathy normal sensation no pathologic reflexes normal coordination  Left leg is in a splint dorsal pedis normal sensory change dorsum of the foot none toes decreased sensation limb alignment normal range of motion deferred stability deferred muscle tone normal lymph nodes deferred skin sensation as stated no pathologic reflexes and coordination balance deferred  Lymphadenopathy:    She has no cervical adenopathy.       Right cervical: No superficial cervical adenopathy present.      Left cervical: No superficial cervical adenopathy present.     She has no axillary adenopathy.       Right: No supraclavicular adenopathy present.       Left: No supraclavicular adenopathy present.  Neurological: She is alert and oriented to person, place, and time. She is not disoriented. She displays no atrophy and no tremor. A sensory deficit is present. She exhibits normal muscle tone. Gait abnormal. She displays no Babinski's sign on the right side. She displays no Babinski's sign on the left side.  Reflex Scores:      Bicep reflexes are 2+ on the right side and 2+ on the left side.      Patellar reflexes are 2+ on the right side and 2+ on the left side. Skin: Skin is warm, dry and intact. Capillary refill takes less than 2 seconds. No abrasion,  no bruising, no ecchymosis, no laceration and no rash noted. She is not diaphoretic. No erythema.  Psychiatric: Her speech is normal and behavior is normal. Judgment and thought content normal.  Mood and affect flat depressed She is attentive.  Vitals reviewed.   Ortho Exam see above    Encounter Diagnoses  Name Primary?  . Closed fracture of left ankle, initial encounter Yes  . Trimalleolar fracture of ankle, closed, left, initial encounter    She has a trimalleolar fracture large posterior fragment  This will need CAT scan to determine if front and back surgery needed.    PLAN:   The patient is sent for CAT scan at [facility]. Surgery scheduled for Thursday.  The procedure has been fully reviewed with the patient; The risks and benefits of surgery have been discussed and explained and understood. Alternative treatment has also been reviewed, questions were encouraged and answered. The postoperative plan is also been reviewed.  I told her ankle will not be normal she'll have stiffness you have changes when the weather occurs with pain and she will also have risk of arthritis  She will need further pain management and I took that overall she is having surgery. I increased her oxycodone 20 mg  every 4

## 2017-06-20 NOTE — Patient Instructions (Signed)
[name] [name]  06/20/2017     @PREFPERIOPPHARMACY @   Your procedure is scheduled on  06/21/2017   Report to [facility] at  0930  A.M.  Call this number if you have problems the morning of surgery:  [phone]   Remember:  Do not eat food or drink liquids after midnight.  Take these medicines the morning of surgery with A SIP OF WATER  Adderall, zofran, oxycodone, phenergan, effexor.   Do not wear jewelry, make-up or nail polish.  Do not wear lotions, powders, or perfumes, or deoderant.  Do not shave 48 hours prior to surgery.  Men may shave face and neck.  Do not bring valuables to the hospital.  [facility] is not responsible for any belongings or valuables.  Contacts, dentures or bridgework may not be worn into surgery.  Leave your suitcase in the car.  After surgery it may be brought to your room.  For patients admitted to the hospital, discharge time will be determined by your treatment team.  Patients discharged the day of surgery will not be allowed to drive home.   Name and phone number of your driver:   family Special instructions:  None  Please read over the following fact sheets that you were given. Anesthesia Post-op Instructions and Care and Recovery After Surgery       Displaced Bimalleolar Ankle Fracture Treated With ORIF A bimalleolar fracture is two breaks (fractures) in the lower bones of the leg that help to make up the ankle. These fractures are in the far ends of the bone that you feel as the bump on the outside of the ankle (fibula) and the bone that you feel as the bump on the inside of the ankle (tibia). The fracture is displaced. This means that the bones are not lined up correctly. The bones will be put back into position with a procedure that is called open reduction with internal fixation (ORIF). A combination of screws, screws and a metal plate, or different types of wiring will be used to hold the bones in place. Tell a health  care provider about:  Any allergies you have.  All medicines you are taking, including vitamins, herbs, eye drops, creams, and over-the-counter medicines.  Any problems you or family members have had with anesthetic medicines.  Any blood disorders you have.  Any surgeries you have had.  Any medical conditions you have, including the possibility of pregnancy. What are the risks? Generally, this is a safe procedure. However, problems may occur, including:  Excessive bleeding.  Infection.  Failure of the fracture to heal.  Long-term pain and stiffness (arthritis).  Stiffness of the ankle after the repair.  What happens before the procedure?  Ask your health care provider about: ? Changing or stopping your regular medicines. This is especially important if you are taking diabetes medicines or blood thinners. ? Taking medicines such as aspirin and ibuprofen. These medicines can thin your blood. Do not take these medicines before your procedure if your health care provider instructs you not to.  Follow instructions from your health care provider about eating or drinking restrictions.  Plan to have someone take you home after the procedure.  If you go home right after the procedure, plan to have someone with you for 24 hours.  Ask your health care provider how your surgical site will be marked or identified.  You may be given antibiotic medicine to help prevent infection.  What happens during the procedure?  To reduce your risk of infection: ? Your health care team will wash or sanitize their hands. ? Your skin will be washed with soap.  An IV tube will be inserted into one of your veins. Medicine will flow directly into your body through this tube. You may be given antibiotic medicine and medicine for pain through the IV tube.  You will be given one or more of the following: ? A medicine that numbs the area (local anesthetic). ? A medicine that makes you fall asleep  (general anesthetic). ? A medicine that is injected into your spine that numbs the area below and slightly above the injection site (spinal anesthetic). ? A medicine that is injected into an area of your body that numbs everything below the injection site (regional anesthetic).  The surgeon will make an incision through your skin to expose the areas of the fracture.  The broken bones will be put back into their normal positions. The surgeon will use a combination of screws, screws and a metal plate, or different types of wiring to hold the bones in place.  After the bones are back in place, the surgeon will close the incision using stitches (sutures) or staples.  A bandage (dressing) and a cast, splint, or supportive boot will be placed over your ankle. The procedure may vary among health care providers and hospitals. What happens after the procedure?  Your blood pressure, heart rate, breathing rate, and blood oxygen level will be monitored often until the medicines you were given have worn off.  You may be given medicine to control the pain. This information is not intended to replace advice given to you by your health care provider. Make sure you discuss any questions you have with your health care provider. Document Released: 06/21/2005 Document Revised: 02/17/2016 Document Reviewed: 08/05/2014 Elsevier Interactive Patient Education  2018 [place]. Displaced Bimalleolar Ankle Fracture Treated With ORIF, Care After Refer to this sheet in the next few weeks. These instructions provide you with information about caring for yourself after your procedure. Your health care provider may also give you more specific instructions. Your treatment has been planned according to current medical practices, but problems sometimes occur. Call your health care provider if you have any problems or questions after your procedure. What can I expect after the procedure? After the procedure, it is common to  have:  Pain.  Swelling.  Follow these instructions at home: If you have a cast:  Do not stick anything inside the cast to scratch your skin. Doing that increases your risk of infection.  Check the skin around the cast every day. Report any concerns to your health care provider. You may put lotion on dry skin around the edges of the cast. Do not apply lotion to the skin underneath the cast.  Do not put pressure on any part of the cast until it is fully hardened. This may take several hours.  Keep the cast clean and dry. If you have a splint or boot:  Wear it as directed by your health care provider. Remove it only as directed by your health care provider.  Loosen the splint or boot if your toes become numb and tingle, or if they turn cold and blue.  Do not pressure on any part of the splint or boot until it is fully hardened. This may take several hours.  Keep the splint or boot clean and dry. Bathing  Do not take baths, swim,  or use a hot tub until your health care provider approves. Ask your health care provider if you can take showers. You may only be allowed to take sponge baths for bathing.  If your health care provider approves bathing and showering, cover the cast or splint with a watertight plastic bag to protect it from water. Do not let the cast or splint get wet.  Keep the bandage (dressing) dry until your health care provider says it can be removed. Incision care  There are many ways to close and cover an incision. For example, an incision can be closed with stitches (sutures), skin glue, or adhesive strips. Follow instructions from your health care provider about: ? How to take care of your incision. ? When and how you should change your bandage (dressing). ? When you should remove your dressing. ? Removing whatever was used to close your incision.  Check your incision area every day for signs of infection. Watch for: ? Redness, swelling, or pain. ? Fluid, blood,  or pus. Managing pain, stiffness, and swelling  Move your toes often to avoid stiffness and to lessen swelling.  Raise (elevate) the injured area above the level of your heart while you are sitting or lying down.  If directed, apply ice to the injured area (if you have a splint or a boot, not a cast). ? Put ice in a plastic bag. ? Place a towel between your skin and the bag. ? Leave the ice on for 20 minutes, 2-3 times per day. Driving  Do not drive or operate heavy machinery while taking pain medicine.  Do not drive while wearing a cast, splint, or boot on a foot that you use for driving. Activity  Return to your normal activities as directed by your health care provider. Ask your health care provider what activities are safe for you.  Perform exercises daily as directed by your health care provider or physical therapist. Safety  Do not walk on the injured ankle and do not use it to support your body weight until your health care provider says that you can. Follow these instructions exactly to prevent problems. Use crutches or other supportive devices as directed by your health care provider. General instructions  Do not use any tobacco products, including cigarettes, chewing tobacco, or e-cigarettes. Tobacco can delay bone healing. If you need help quitting, ask your health care provider.  Take medicines only as directed by your health care provider.  Keep all follow-up visits as directed by your health care provider. This is important. Contact a health care provider if:  You have a fever.  Your pain medicine is not helping.  You have redness, swelling, or pain at the site of your incision.  You have fluid, blood, or pus coming from your incision or seeping through your cast.  You notice a bad smell coming from your incision or your dressing. Get help right away if:  You have chest pain.  You have difficulty breathing.  You have numbness or tingling in your foot or  leg.  Your foot becomes cold, pale, or blue. This information is not intended to replace advice given to you by your health care provider. Make sure you discuss any questions you have with your health care provider. Document Released: 03/31/2005 Document Revised: 02/17/2016 Document Reviewed: 08/05/2014 Elsevier Interactive Patient Education  2018 [place] Anesthesia, Adult General anesthesia is the use of medicines to make a person "go to sleep" (be unconscious) for a medical  procedure. General anesthesia is often recommended when a procedure:  Is long.  Requires you to be still or in an unusual position.  Is major and can cause you to lose blood.  Is impossible to do without general anesthesia.  The medicines used for general anesthesia are called general anesthetics. In addition to making you sleep, the medicines:  Prevent pain.  Control your blood pressure.  Relax your muscles.  Tell a health care provider about:  Any allergies you have.  All medicines you are taking, including vitamins, herbs, eye drops, creams, and over-the-counter medicines.  Any problems you or family members have had with anesthetic medicines.  Types of anesthetics you have had in the past.  Any bleeding disorders you have.  Any surgeries you have had.  Any medical conditions you have.  Any history of heart or lung conditions, such as heart failure, sleep apnea, or chronic obstructive pulmonary disease (COPD).  Whether you are pregnant or may be pregnant.  Whether you use tobacco, alcohol, marijuana, or street drugs.  Any history of Armed forces logistics/support/administrative officer.  Any history of depression or anxiety. What are the risks? Generally, this is a safe procedure. However, problems may occur, including:  Allergic reaction to anesthetics.  Lung and heart problems.  Inhaling food or liquids from your stomach into your lungs (aspiration).  Injury to nerves.  Waking up during your  procedure and being unable to move (rare).  Extreme agitation or a state of mental confusion (delirium) when you wake up from the anesthetic.  Air in the bloodstream, which can lead to stroke.  These problems are more likely to develop if you are having a major surgery or if you have an advanced medical condition. You can prevent some of these complications by answering all of your health care provider's questions thoroughly and by following all pre-procedure instructions. General anesthesia can cause side effects, including:  Nausea or vomiting  A sore throat from the breathing tube.  Feeling cold or shivery.  Feeling tired, washed out, or achy.  Sleepiness or drowsiness.  Confusion or agitation.  What happens before the procedure? Staying hydrated Follow instructions from your health care provider about hydration, which may include:  Up to 2 hours before the procedure - you may continue to drink clear liquids, such as water, clear fruit juice, black coffee, and plain tea.  Eating and drinking restrictions Follow instructions from your health care provider about eating and drinking, which may include:  8 hours before the procedure - stop eating heavy meals or foods such as meat, fried foods, or fatty foods.  6 hours before the procedure - stop eating light meals or foods, such as toast or cereal.  6 hours before the procedure - stop drinking milk or drinks that contain milk.  2 hours before the procedure - stop drinking clear liquids.  Medicines  Ask your health care provider about: ? Changing or stopping your regular medicines. This is especially important if you are taking diabetes medicines or blood thinners. ? Taking medicines such as aspirin and ibuprofen. These medicines can thin your blood. Do not take these medicines before your procedure if your health care provider instructs you not to. ? Taking new dietary supplements or medicines. Do not take these during the  week before your procedure unless your health care provider approves them.  If you are told to take a medicine or to continue taking a medicine on the day of the procedure, take the medicine with sips of  water. General instructions   Ask if you will be going home the same day, the following day, or after a longer hospital stay. ? Plan to have someone take you home. ? Plan to have someone stay with you for the first 24 hours after you leave the hospital or clinic.  For 3-6 weeks before the procedure, try not to use any tobacco products, such as cigarettes, chewing tobacco, and e-cigarettes.  You may brush your teeth on the morning of the procedure, but make sure to spit out the toothpaste. What happens during the procedure?  You will be given anesthetics through a mask and through an IV tube in one of your veins.  You may receive medicine to help you relax (sedative).  As soon as you are asleep, a breathing tube may be used to help you breathe.  An anesthesia specialist will stay with you throughout the procedure. He or she will help keep you comfortable and safe by continuing to give you medicines and adjusting the amount of medicine that you get. He or she will also watch your blood pressure, pulse, and oxygen levels to make sure that the anesthetics do not cause any problems.  If a breathing tube was used to help you breathe, it will be removed before you wake up. The procedure may vary among health care providers and hospitals. What happens after the procedure?  You will wake up, often slowly, after the procedure is complete, usually in a recovery area.  Your blood pressure, heart rate, breathing rate, and blood oxygen level will be monitored until the medicines you were given have worn off.  You may be given medicine to help you calm down if you feel anxious or agitated.  If you will be going home the same day, your health care provider may check to make sure you can stand,  drink, and urinate.  Your health care providers will treat your pain and side effects before you go home.  Do not drive for 24 hours if you received a sedative.  You may: ? Feel nauseous and vomit. ? Have a sore throat. ? Have mental slowness. ? Feel cold or shivery. ? Feel sleepy. ? Feel tired. ? Feel sore or achy, even in parts of your body where you did not have surgery. This information is not intended to replace advice given to you by your health care provider. Make sure you discuss any questions you have with your health care provider. Document Released: 12/19/2007 Document Revised: 02/22/2016 Document Reviewed: 08/26/2015 Elsevier Interactive Patient Education  2018 [place] Anesthesia, Adult, Care After These instructions provide you with information about caring for yourself after your procedure. Your health care provider may also give you more specific instructions. Your treatment has been planned according to current medical practices, but problems sometimes occur. Call your health care provider if you have any problems or questions after your procedure. What can I expect after the procedure? After the procedure, it is common to have:  Vomiting.  A sore throat.  Mental slowness.  It is common to feel:  Nauseous.  Cold or shivery.  Sleepy.  Tired.  Sore or achy, even in parts of your body where you did not have surgery.  Follow these instructions at home: For at least 24 hours after the procedure:  Do not: ? Participate in activities where you could fall or become injured. ? Drive. ? Use heavy machinery. ? Drink alcohol. ? Take sleeping pills or medicines that cause  drowsiness. ? Make important decisions or sign legal documents. ? Take care of children on your own.  Rest. Eating and drinking  If you vomit, drink water, juice, or soup when you can drink without vomiting.  Drink enough fluid to keep your urine clear or pale  yellow.  Make sure you have little or no nausea before eating solid foods.  Follow the diet recommended by your health care provider. General instructions  Have a responsible adult stay with you until you are awake and alert.  Return to your normal activities as told by your health care provider. Ask your health care provider what activities are safe for you.  Take over-the-counter and prescription medicines only as told by your health care provider.  If you smoke, do not smoke without supervision.  Keep all follow-up visits as told by your health care provider. This is important. Contact a health care provider if:  You continue to have nausea or vomiting at home, and medicines are not helpful.  You cannot drink fluids or start eating again.  You cannot urinate after 8-12 hours.  You develop a skin rash.  You have fever.  You have increasing redness at the site of your procedure. Get help right away if:  You have difficulty breathing.  You have chest pain.  You have unexpected bleeding.  You feel that you are having a life-threatening or urgent problem. This information is not intended to replace advice given to you by your health care provider. Make sure you discuss any questions you have with your health care provider. Document Released: 12/18/2000 Document Revised: 02/14/2016 Document Reviewed: 08/26/2015 Elsevier Interactive Patient Education  [employer].

## 2017-06-21 ENCOUNTER — Ambulatory Visit (HOSPITAL_COMMUNITY): Payer: Medicaid Other

## 2017-06-21 ENCOUNTER — Encounter (HOSPITAL_COMMUNITY): Payer: Self-pay | Admitting: *Deleted

## 2017-06-21 ENCOUNTER — Ambulatory Visit (HOSPITAL_COMMUNITY)
Admission: RE | Admit: 2017-06-21 | Discharge: 2017-06-21 | Disposition: A | Discharge status: Home / Self Care | Payer: Medicaid Other | Source: Ambulatory Visit | Attending: Orthopedic Surgery | Admitting: Orthopedic Surgery

## 2017-06-21 ENCOUNTER — Ambulatory Visit (HOSPITAL_COMMUNITY): Payer: Medicaid Other | Admitting: Anesthesiology

## 2017-06-21 ENCOUNTER — Encounter (HOSPITAL_COMMUNITY)
Admission: RE | Disposition: A | Discharge status: Home / Self Care | Payer: Self-pay | Source: Ambulatory Visit | Attending: Orthopedic Surgery

## 2017-06-21 ENCOUNTER — Encounter: Payer: Self-pay | Admitting: *Deleted

## 2017-06-21 ENCOUNTER — Encounter (HOSPITAL_COMMUNITY)
Admission: RE | Admit: 2017-06-21 | Discharge: 2017-06-21 | Disposition: A | Discharge status: Home / Self Care | Payer: Medicaid Other | Source: Ambulatory Visit | Attending: Orthopedic Surgery | Admitting: Orthopedic Surgery

## 2017-06-21 DIAGNOSIS — Y9289 Other specified places as the place of occurrence of the external cause: Secondary | ICD-10-CM | POA: Insufficient documentation

## 2017-06-21 DIAGNOSIS — K219 Gastro-esophageal reflux disease without esophagitis: Secondary | ICD-10-CM | POA: Insufficient documentation

## 2017-06-21 DIAGNOSIS — M797 Fibromyalgia: Secondary | ICD-10-CM | POA: Diagnosis not present

## 2017-06-21 DIAGNOSIS — G894 Chronic pain syndrome: Secondary | ICD-10-CM | POA: Insufficient documentation

## 2017-06-21 DIAGNOSIS — F418 Other specified anxiety disorders: Secondary | ICD-10-CM | POA: Insufficient documentation

## 2017-06-21 DIAGNOSIS — M1990 Unspecified osteoarthritis, unspecified site: Secondary | ICD-10-CM | POA: Insufficient documentation

## 2017-06-21 DIAGNOSIS — Z5309 Procedure and treatment not carried out because of other contraindication: Secondary | ICD-10-CM | POA: Diagnosis not present

## 2017-06-21 DIAGNOSIS — W19XXXA Unspecified fall, initial encounter: Secondary | ICD-10-CM | POA: Insufficient documentation

## 2017-06-21 DIAGNOSIS — S82852A Displaced trimalleolar fracture of left lower leg, initial encounter for closed fracture: Secondary | ICD-10-CM | POA: Insufficient documentation

## 2017-06-21 HISTORY — DX: Dorsalgia, unspecified: M54.9

## 2017-06-21 HISTORY — DX: Other intervertebral disc degeneration, lumbosacral region without mention of lumbar back pain or lower extremity pain: M51.379

## 2017-06-21 HISTORY — DX: Other intervertebral disc degeneration, lumbosacral region: M51.37

## 2017-06-21 LAB — CBC
HEMATOCRIT: 40.1 % (ref 36.0–46.0)
HEMOGLOBIN: 13.5 g/dL (ref 12.0–15.0)
MCH: 32 pg (ref 26.0–34.0)
MCHC: 33.7 g/dL (ref 30.0–36.0)
MCV: 95 fL (ref 78.0–100.0)
Platelets: 296 10*3/uL (ref 150–400)
RBC: 4.22 MIL/uL (ref 3.87–5.11)
RDW: 12.7 % (ref 11.5–15.5)
WBC: 7.5 10*3/uL (ref 4.0–10.5)

## 2017-06-21 LAB — BASIC METABOLIC PANEL
Anion gap: 9 (ref 5–15)
BUN: 8 mg/dL (ref 6–20)
CHLORIDE: 101 mmol/L (ref 101–111)
CO2: 28 mmol/L (ref 22–32)
CREATININE: 0.81 mg/dL (ref 0.44–1.00)
Calcium: 8.9 mg/dL (ref 8.9–10.3)
GFR calc non Af Amer: >60 mL/min (ref >60)
Glucose, Bld: 105 mg/dL — ABNORMAL HIGH (ref 65–99)
Potassium: 3.6 mmol/L (ref 3.5–5.1)
Sodium: 138 mmol/L (ref 135–145)

## 2017-06-21 SURGERY — CANCELLED PROCEDURE
Anesthesia: General | Laterality: Left

## 2017-06-21 MED ORDER — DEXAMETHASONE SODIUM PHOSPHATE 4 MG/ML IJ SOLN
4.0000 mg | INTRAMUSCULAR | Status: AC
  Administered 2017-06-21: 4 mg via INTRAVENOUS

## 2017-06-21 MED ORDER — LACTATED RINGERS IV SOLN: INTRAVENOUS | Status: DC

## 2017-06-21 MED ORDER — POVIDONE-IODINE 10 % EX SWAB: 2.0000 "application " | Freq: Once | CUTANEOUS | Status: DC

## 2017-06-21 MED ORDER — PROPOFOL 10 MG/ML IV BOLUS
INTRAVENOUS | Status: DC | PRN
  Administered 2017-06-21: 30 mg via INTRAVENOUS
  Administered 2017-06-21: 170 mg via INTRAVENOUS

## 2017-06-21 MED ORDER — FENTANYL CITRATE (PF) 250 MCG/5ML IJ SOLN
INTRAMUSCULAR | Status: AC
  Filled 2017-06-21: qty 5

## 2017-06-21 MED ORDER — FENTANYL CITRATE (PF) 100 MCG/2ML IJ SOLN: 25.0000 ug | INTRAMUSCULAR | Status: DC | PRN

## 2017-06-21 MED ORDER — SUFENTANIL CITRATE 50 MCG/ML IV SOLN
INTRAVENOUS | Status: AC
  Filled 2017-06-21: qty 1

## 2017-06-21 MED ORDER — LIDOCAINE HCL (PF) 1 % IJ SOLN
INTRAMUSCULAR | Status: AC
  Filled 2017-06-21: qty 10

## 2017-06-21 MED ORDER — ONDANSETRON HCL 4 MG/2ML IJ SOLN
4.0000 mg | Freq: Once | INTRAMUSCULAR | Status: AC
  Administered 2017-06-21: 4 mg via INTRAVENOUS

## 2017-06-21 MED ORDER — SUGAMMADEX SODIUM 500 MG/5ML IV SOLN
INTRAVENOUS | Status: DC | PRN
  Administered 2017-06-21: 400 mg via INTRAVENOUS

## 2017-06-21 MED ORDER — FENTANYL CITRATE (PF) 100 MCG/2ML IJ SOLN
25.0000 ug | Freq: Once | INTRAMUSCULAR | Status: AC
  Administered 2017-06-21: 25 ug via INTRAVENOUS

## 2017-06-21 MED ORDER — FENTANYL CITRATE (PF) 100 MCG/2ML IJ SOLN
25.0000 ug | INTRAMUSCULAR | Status: AC | PRN
  Administered 2017-06-21 (×2): 25 ug via INTRAVENOUS

## 2017-06-21 MED ORDER — LIDOCAINE HCL (CARDIAC) 10 MG/ML IV SOLN
INTRAVENOUS | Status: DC | PRN
  Administered 2017-06-21: 50 mg via INTRAVENOUS

## 2017-06-21 MED ORDER — MIDAZOLAM HCL 2 MG/2ML IJ SOLN
INTRAMUSCULAR | Status: AC
  Filled 2017-06-21: qty 2

## 2017-06-21 MED ORDER — VANCOMYCIN HCL IN DEXTROSE 1-5 GM/200ML-% IV SOLN
INTRAVENOUS | Status: AC
  Filled 2017-06-21: qty 200

## 2017-06-21 MED ORDER — OXYCODONE HCL 20 MG PO TABS: 1.0000 | ORAL_TABLET | ORAL | 0 refills | Status: DC | PRN

## 2017-06-21 MED ORDER — PROPOFOL 10 MG/ML IV BOLUS
INTRAVENOUS | Status: AC
  Filled 2017-06-21: qty 40

## 2017-06-21 MED ORDER — HYDROMORPHONE HCL 1 MG/ML IJ SOLN
0.2500 mg | INTRAMUSCULAR | Status: DC | PRN
  Administered 2017-06-21 (×4): 0.5 mg via INTRAVENOUS
  Filled 2017-06-21 (×2): qty 1

## 2017-06-21 MED ORDER — SUFENTANIL CITRATE 50 MCG/ML IV SOLN
INTRAVENOUS | Status: DC | PRN
  Administered 2017-06-21: 5 ug via INTRAVENOUS
  Administered 2017-06-21 (×4): 10 ug via INTRAVENOUS
  Administered 2017-06-21: 5 ug via INTRAVENOUS

## 2017-06-21 MED ORDER — ONDANSETRON HCL 4 MG/2ML IJ SOLN
INTRAMUSCULAR | Status: AC
  Filled 2017-06-21: qty 2

## 2017-06-21 MED ORDER — FENTANYL CITRATE (PF) 100 MCG/2ML IJ SOLN
INTRAMUSCULAR | Status: AC
  Filled 2017-06-21: qty 2

## 2017-06-21 MED ORDER — LACTATED RINGERS IV SOLN
INTRAVENOUS | Status: DC
  Administered 2017-06-21 (×2): via INTRAVENOUS

## 2017-06-21 MED ORDER — MIDAZOLAM HCL 2 MG/2ML IJ SOLN
1.0000 mg | INTRAMUSCULAR | Status: AC
  Administered 2017-06-21: 2 mg via INTRAVENOUS

## 2017-06-21 MED ORDER — ROCURONIUM BROMIDE 100 MG/10ML IV SOLN
INTRAVENOUS | Status: DC | PRN
  Administered 2017-06-21: 35 mg via INTRAVENOUS
  Administered 2017-06-21: 5 mg via INTRAVENOUS

## 2017-06-21 MED ORDER — SCOPOLAMINE 1 MG/3DAYS TD PT72
MEDICATED_PATCH | TRANSDERMAL | Status: AC
  Filled 2017-06-21: qty 1

## 2017-06-21 MED ORDER — CHLORHEXIDINE GLUCONATE 4 % EX LIQD: 60.0000 mL | Freq: Once | CUTANEOUS | Status: DC

## 2017-06-21 MED ORDER — SODIUM CHLORIDE 0.9 % IJ SOLN
INTRAMUSCULAR | Status: AC
Start: 2017-06-21 — End: ?
  Filled 2017-06-21: qty 10

## 2017-06-21 MED ORDER — DEXAMETHASONE SODIUM PHOSPHATE 4 MG/ML IJ SOLN
INTRAMUSCULAR | Status: AC
  Filled 2017-06-21: qty 1

## 2017-06-21 MED ORDER — BUPIVACAINE-EPINEPHRINE (PF) 0.5% -1:200000 IJ SOLN
INTRAMUSCULAR | Status: AC
  Filled 2017-06-21: qty 60

## 2017-06-21 MED ORDER — VANCOMYCIN HCL IN DEXTROSE 1-5 GM/200ML-% IV SOLN
1000.0000 mg | INTRAVENOUS | Status: AC
  Administered 2017-06-21: 1000 mg via INTRAVENOUS

## 2017-06-21 MED ORDER — SCOPOLAMINE 1 MG/3DAYS TD PT72
1.0000 | MEDICATED_PATCH | Freq: Once | TRANSDERMAL | Status: DC
  Administered 2017-06-21: 1.5 mg via TRANSDERMAL

## 2017-06-21 MED ORDER — MIDAZOLAM HCL 2 MG/2ML IJ SOLN
1.0000 mg | INTRAMUSCULAR | Status: DC | PRN
  Administered 2017-06-21: 2 mg via INTRAVENOUS

## 2017-06-21 MED ORDER — ASPIRIN 325 MG PO TBEC: 325.0000 mg | DELAYED_RELEASE_TABLET | Freq: Every day | ORAL | 0 refills | Status: DC

## 2017-06-21 SURGICAL SUPPLY — 56 items
BANDAGE ELASTIC 4 LF NS (GAUZE/BANDAGES/DRESSINGS) ×2 IMPLANT
BANDAGE ESMARK 4X12 BL STRL LF (DISPOSABLE) ×1 IMPLANT
BIT DRILL 3.5X122MM AO FIT (BIT) IMPLANT
BIT DRILL CANN 2.7 (BIT)
BIT DRILL SRG 2.7XCANN AO CPLG (BIT) IMPLANT
BIT DRL SRG 2.7XCANN AO CPLNG (BIT)
BLADE SURG SZ10 CARB STEEL (BLADE) ×2 IMPLANT
BNDG COHESIVE 4X5 TAN STRL (GAUZE/BANDAGES/DRESSINGS) ×2 IMPLANT
BNDG ESMARK 4X12 BLUE STRL LF (DISPOSABLE) ×2
BNDG GAUZE ELAST 4 BULKY (GAUZE/BANDAGES/DRESSINGS) ×2 IMPLANT
CHLORAPREP W/TINT 26ML (MISCELLANEOUS) ×4 IMPLANT
CLOTH BEACON ORANGE TIMEOUT ST (SAFETY) ×2 IMPLANT
COVER LIGHT HANDLE STERIS (MISCELLANEOUS) ×4 IMPLANT
CUFF TOURNIQUET SINGLE 34IN LL (TOURNIQUET CUFF) ×2 IMPLANT
DECANTER SPIKE VIAL GLASS SM (MISCELLANEOUS) ×4 IMPLANT
DRAPE C-ARM FOLDED MOBILE STRL (DRAPES) ×2 IMPLANT
DRAPE PROXIMA HALF (DRAPES) ×2 IMPLANT
DRILL 2.6X122MM WL AO SHAFT (BIT) IMPLANT
GAUZE SPONGE 4X4 12PLY STRL (GAUZE/BANDAGES/DRESSINGS) ×2 IMPLANT
GAUZE XEROFORM 5X9 LF (GAUZE/BANDAGES/DRESSINGS) ×2 IMPLANT
GLOVE BIOGEL PI IND STRL 7.0 (GLOVE) ×2 IMPLANT
GLOVE BIOGEL PI INDICATOR 7.0 (GLOVE) ×2
GLOVE SKINSENSE NS SZ8.0 LF (GLOVE) ×1
GLOVE SKINSENSE STRL SZ8.0 LF (GLOVE) ×1 IMPLANT
GLOVE SS N UNI LF 8.5 STRL (GLOVE) ×2 IMPLANT
GOWN STRL REUS W/TWL LRG LVL3 (GOWN DISPOSABLE) ×4 IMPLANT
GOWN STRL REUS W/TWL XL LVL3 (GOWN DISPOSABLE) ×2 IMPLANT
INST SET MINOR BONE (KITS) ×2 IMPLANT
K-WIRE 1.4X100 (WIRE)
K-WIRE 1.6X150 (WIRE)
K-WIRE FX150X1.6XKRSH (WIRE)
K-WIRE SMOOTH 2.0X150 (WIRE)
KIT ROOM TURNOVER APOR (KITS) ×2 IMPLANT
KWIRE 1.4X100 (WIRE) IMPLANT
KWIRE FX150X1.6XKRSH (WIRE) IMPLANT
KWIRE SMOOTH 2.0X150 (WIRE) IMPLANT
MANIFOLD NEPTUNE II (INSTRUMENTS) ×2 IMPLANT
NEEDLE HYPO 21X1.5 SAFETY (NEEDLE) ×2 IMPLANT
NS IRRIG 1000ML POUR BTL (IV SOLUTION) ×2 IMPLANT
PACK BASIC LIMB (CUSTOM PROCEDURE TRAY) ×2 IMPLANT
PAD ABD 5X9 TENDERSORB (GAUZE/BANDAGES/DRESSINGS) ×4 IMPLANT
PAD ARMBOARD 7.5X6 YLW CONV (MISCELLANEOUS) ×2 IMPLANT
PAD CAST 4YDX4 CTTN HI CHSV (CAST SUPPLIES) ×1 IMPLANT
PADDING CAST COTTON 4X4 STRL (CAST SUPPLIES) ×1
SET BASIN LINEN APH (SET/KITS/TRAYS/PACK) ×2 IMPLANT
SPLINT IMMOBILIZER J 3INX20FT (CAST SUPPLIES)
SPLINT J IMMOBILIZER 3X20FT (CAST SUPPLIES) IMPLANT
SPLINT J IMMOBILIZER 4X20FT (CAST SUPPLIES) ×1 IMPLANT
SPLINT J PLASTER J 4INX20Y (CAST SUPPLIES) ×1
SPONGE LAP 18X18 X RAY DECT (DISPOSABLE) ×2 IMPLANT
STAPLER VISISTAT 35W (STAPLE) ×2 IMPLANT
SUT ETHILON 3 0 FSL (SUTURE) IMPLANT
SUT MON AB 0 CT1 (SUTURE) IMPLANT
SUT MON AB 2-0 CT1 36 (SUTURE) IMPLANT
SYR 30ML LL (SYRINGE) ×2 IMPLANT
SYR BULB IRRIGATION 50ML (SYRINGE) ×2 IMPLANT

## 2017-06-21 NOTE — H&P (View-Only) (Signed)
NEW PATIENT OFFICE VISIT    Chief Complaint  Patient presents with  . Follow-up    Left ankle fracture DOI 06/19/17    61 year old female fell on September 25 at her home. She has chronic pain syndrome, she has an unknown or nonfunctioning spinal stimulator. She is on oxycodone 15 mg every 6 hours when necessary for pain and sees a pain management clinic specialist  She sustained a trimalleolar fracture dislocation of her left ankle which was close reduced successfully in emergency room the day of injury.  She comes in complaining of severe pain throbbing numbness in her toes with dull stabbing aching severe pain which is constant seems to be worse if she hangs her foot down associated with swelling all involving the left ankle. She does not have any other complaints of pain or injury    Review of Systems  Constitutional: Negative for chills and fever.  HENT: Negative for hearing loss.   Eyes: Negative for blurred vision.  Respiratory: Negative for shortness of breath.   Cardiovascular: Negative for chest pain.  Musculoskeletal: Positive for back pain and neck pain.  Skin: Negative for rash.  Neurological: Positive for tingling and sensory change. Negative for focal weakness.  Endo/Heme/Allergies: Negative for polydipsia. Does not bruise/bleed easily.  Psychiatric/Behavioral: Negative for memory loss and substance abuse.  All other systems reviewed and are negative.    Past Medical History:  Diagnosis Date  . ABDOMINAL PAIN, LOWER 02/16/2009  . APPENDECTOMY, HX OF 09/11/2007  . ATTENTION DEFICIT DISORDER, ADULT 02/02/2009  . BACK PAIN 07/16/2007   chronic  . CERVICAL RADICULOPATHY, RIGHT 07/16/2007  . CHEST PAIN, ACUTE 09/03/2008  . Chronic abdominal pain   . Chronic pain syndrome 09/11/2007  . Complication of anesthesia    woke up during surgeries few times and woke up during endoscopy and colonscopy few weeks ago, in terrible pain  . DEPRESSION/ANXIETY 06/17/2010  .  Dysuria 02/20/2008  . Elevated liver enzymes 1 and  1/2 months ago  . Family history of anesthesia complication    mother woke up during surgeries  . Fibromyalgia   . Functional GI symptoms   . FX, RAMUS NOS, CLOSED 07/16/2007  . GERD (gastroesophageal reflux disease)   . H/O failed conscious sedation    PT STATES SHE IS HARD TO SEDATE!  . HEMORRHOIDS 09/11/2007  . HIATAL HERNIA, HX OF 09/11/2007  . HX, PERSONAL, MUSCULOSKELETAL DISORD NEC 07/16/2007  . HX, PERSONAL, URINARY CALCULI 07/16/2007  . Irritable bowel syndrome 09/11/2007  . NEOPLASM, SKIN, UNCERTAIN BEHAVIOR 06/02/3381  . OSTEOARTHRITIS 09/11/2007   oa  . OSTEOPENIA 10/23/2007  . PONV (postoperative nausea and vomiting)   . ROTATOR CUFF REPAIR, HX OF 09/11/2007  . Suicidal ideations   . SYMPTOM, PAIN, ABDOMINAL, RIGHT UP QUADRANT 07/16/2007  . TAH/BSO, HX OF 09/11/2007  . UTI 10/06/2010  . VAGINITIS, ATROPHIC, POSTMENOPAUSAL 07/16/2007    Past Surgical History:  Procedure Laterality Date  . ABDOMINAL HYSTERECTOMY    . APPENDECTOMY    . bone pushed back into  place after fracture Left 25-30 yrs ago   face  . cervical radiculopathy  20 yrs ago   RT  . CHOLECYSTECTOMY N/A 02/18/2014   Procedure: LAPAROSCOPIC CHOLECYSTECTOMY;  Surgeon: Harl Bowie, MD;  Location: [facility];  Service: General;  Laterality: N/A;  . COLONOSCOPY    . DILATION AND CURETTAGE OF UTERUS  age 39  . ESOPHAGOGASTRODUODENOSCOPY    . ROTATOR CUFF REPAIR     right  .  SPINAL CORD STIMULATOR IMPLANT  6-7 yrs ago   lower back, pt turned off 2-3 weeks ago due to causing pain  . TOTAL ABDOMINAL HYSTERECTOMY W/ BILATERAL SALPINGOOPHORECTOMY    . TUBAL LIGATION      Family History  Problem Relation Age of Onset  . Heart disease Mother        CAD/ valvular disease  . Kidney disease Mother        lost a kidney -  unknown cause  . Heart disease Brother        CAD  . COPD Father   . Heart disease Father 37       MI  . Heart disease Sister   .  Cancer Other        Colon  . Heart disease Maternal Aunt   . Heart disease Maternal Grandmother   . Colon cancer Cousin    Social History  Substance Use Topics  . Smoking status: Never Smoker  . Smokeless tobacco: Never Used  . Alcohol use No    BP (!) 141/84   Pulse 95 respiratory rate 18  Physical Exam  Constitutional: She is oriented to person, place, and time. She appears well-developed and well-nourished.  Non-toxic appearance. She appears distressed.  Eyes: Conjunctivae, EOM and lids are normal. Right eye exhibits no discharge and no exudate. Left eye exhibits no discharge and no exudate. Right conjunctiva is not injected. Left conjunctiva is not injected.  Neck: Trachea normal and full passive range of motion without pain. Neck supple. No neck rigidity. No tracheal deviation and normal range of motion present. No thyroid mass and no thyromegaly present.  Cardiovascular: Intact distal pulses.   Pulses:      Radial pulses are 2+ on the right side, and 2+ on the left side.       Dorsalis pedis pulses are 2+ on the right side, and 2+ on the left side.  Musculoskeletal:  Right and left upper extremity normal range of motion stability strength alignment skin pulse without lymphadenopathy sensory deficit pathologic reflexes and normal coordination  Right lower extremity normal range of motion strength stability alignment skin pulse no lymphadenopathy normal sensation no pathologic reflexes normal coordination  Left leg is in a splint dorsal pedis normal sensory change dorsum of the foot none toes decreased sensation limb alignment normal range of motion deferred stability deferred muscle tone normal lymph nodes deferred skin sensation as stated no pathologic reflexes and coordination balance deferred  Lymphadenopathy:    She has no cervical adenopathy.       Right cervical: No superficial cervical adenopathy present.      Left cervical: No superficial cervical adenopathy present.     She has no axillary adenopathy.       Right: No supraclavicular adenopathy present.       Left: No supraclavicular adenopathy present.  Neurological: She is alert and oriented to person, place, and time. She is not disoriented. She displays no atrophy and no tremor. A sensory deficit is present. She exhibits normal muscle tone. Gait abnormal. She displays no Babinski's sign on the right side. She displays no Babinski's sign on the left side.  Reflex Scores:      Bicep reflexes are 2+ on the right side and 2+ on the left side.      Patellar reflexes are 2+ on the right side and 2+ on the left side. Skin: Skin is warm, dry and intact. Capillary refill takes less than 2 seconds. No abrasion,  no bruising, no ecchymosis, no laceration and no rash noted. She is not diaphoretic. No erythema.  Psychiatric: Her speech is normal and behavior is normal. Judgment and thought content normal.  Mood and affect flat depressed She is attentive.  Vitals reviewed.   Ortho Exam see above    Encounter Diagnoses  Name Primary?  . Closed fracture of left ankle, initial encounter Yes  . Trimalleolar fracture of ankle, closed, left, initial encounter    She has a trimalleolar fracture large posterior fragment  This will need CAT scan to determine if front and back surgery needed.    PLAN:   The patient is sent for CAT scan at [facility]. Surgery scheduled for Thursday.  The procedure has been fully reviewed with the patient; The risks and benefits of surgery have been discussed and explained and understood. Alternative treatment has also been reviewed, questions were encouraged and answered. The postoperative plan is also been reviewed.  I told her ankle will not be normal she'll have stiffness you have changes when the weather occurs with pain and she will also have risk of arthritis  She will need further pain management and I took that overall she is having surgery. I increased her oxycodone 20 mg  every 4

## 2017-06-21 NOTE — Progress Notes (Signed)
No pre-cert required for ORIF Left ankle scheduled for 06/21/17 per [place] [facility] guidelines

## 2017-06-21 NOTE — Anesthesia Procedure Notes (Signed)
Procedure Name: Intubation Date/Time: 06/21/2017 1:31 PM Performed by: Vista Deck Pre-anesthesia Checklist: Patient identified, Patient being monitored, Timeout performed, Emergency Drugs available and Suction available Patient Re-evaluated:Patient Re-evaluated prior to induction Oxygen Delivery Method: Circle System Utilized Preoxygenation: Pre-oxygenation with 100% oxygen Induction Type: IV induction Ventilation: Mask ventilation without difficulty Laryngoscope Size: Mac and 3 Grade View: Grade II Tube type: Oral Tube size: 7.0 mm Number of attempts: 1 Airway Equipment and Method: stylet and Oral airway Placement Confirmation: ETT inserted through vocal cords under direct vision,  positive ETCO2 and breath sounds checked- equal and bilateral Secured at: 21 cm Tube secured with: Tape Dental Injury: Teeth and Oropharynx as per pre-operative assessment

## 2017-06-21 NOTE — Interval H&P Note (Signed)
History and Physical Interval Note:  06/21/2017 10:32 AM  [name] [name]  has presented today for surgery, with the diagnosis of Trimalleolar fracture left ankle  The various methods of treatment have been discussed with the patient and family. After consideration of risks, benefits and other options for treatment, the patient has consented to  Procedure(s): OPEN REDUCTION INTERNAL FIXATION (ORIF) ANKLE FRACTURE (Left) as a surgical intervention .  The patient's history has been reviewed, patient examined, no change in status, stable for surgery.  I have reviewed the patient's chart and labs.  Questions were answered to the patient's satisfaction.     Arther Abbott

## 2017-06-21 NOTE — Anesthesia Preprocedure Evaluation (Addendum)
Anesthesia Evaluation  Patient identified by MRN, date of birth, ID band Patient awake    Reviewed: Allergy & Precautions, H&P , NPO status , Patient's Chart, lab work & pertinent test results  History of Anesthesia Complications (+) PONV, AWARENESS UNDER ANESTHESIA and history of anesthetic complications  Airway Mallampati: II  TM Distance: >3 FB   Mouth opening: Limited Mouth Opening  Dental  (+) Teeth Intact, Dental Advisory Given, Chipped,    Pulmonary neg pulmonary ROS,    Pulmonary exam normal breath sounds clear to auscultation       Cardiovascular negative cardio ROS Normal cardiovascular exam Rhythm:Regular Rate:Normal     Neuro/Psych PSYCHIATRIC DISORDERS Depression Chronic pain  Neuromuscular disease negative neurological ROS     GI/Hepatic negative GI ROS, Neg liver ROS, GERD  Medicated,  Endo/Other  negative endocrine ROS  Renal/GU negative Renal ROS  negative genitourinary   Musculoskeletal  (+) Fibromyalgia -  Abdominal   Peds  Hematology negative hematology ROS (+)   Anesthesia Other Findings  Hx of intraoperative awareness during colonoscopy and upper endoscopy as well as D&C.  Spinal cord stimulator, hx chronic pain.  Reproductive/Obstetrics                            Anesthesia Physical Anesthesia Plan  ASA: II  Anesthesia Plan: General   Post-op Pain Management:    Induction: Intravenous, Rapid sequence and Cricoid pressure planned  PONV Risk Score and Plan:   Airway Management Planned: Oral ETT  Additional Equipment:   Intra-op Plan:   Post-operative Plan: Extubation in OR  Informed Consent: I have reviewed the patients History and Physical, chart, labs and discussed the procedure including the risks, benefits and alternatives for the proposed anesthesia with the patient or authorized representative who has indicated his/her understanding and  acceptance.     Plan Discussed with:   Anesthesia Plan Comments:        Anesthesia Quick Evaluation

## 2017-06-21 NOTE — Transfer of Care (Signed)
Immediate Anesthesia Transfer of Care Note  Patient: [name] [name]  Procedure(s) Performed: Procedure(s): CANCELLED PROCEDURE J-splint apllication Left ankle (Left)  Patient Location: PACU  Anesthesia Type:General  Level of Consciousness: awake and alert   Airway & Oxygen Therapy: Patient Spontanous Breathing and Patient connected to nasal cannula oxygen  Post-op Assessment: Report given to RN and Post -op Vital signs reviewed and stable  Post vital signs: Reviewed and stable  Last Vitals:  Vitals:   06/21/17 1310 06/21/17 1311  BP:  129/79  Resp: 15 15  Temp:    SpO2: 100% 100%    Last Pain:  Vitals:   06/21/17 1245  TempSrc:   PainSc: 8       Patients Stated Pain Goal: 5 (48/54/62 7035)  Complications: No apparent anesthesia complications

## 2017-06-21 NOTE — Pre-Procedure Instructions (Signed)
06/21/2017  2:03 PM  [name]  61 y.o. female  PRE-OPERATIVE DIAGNOSIS:  Trimalleolar fracture left ankle  POST-OPERATIVE DIAGNOSIS:  Trimalleolar fracture left ankle  PROCEDURE:  Procedure(s): CANCELLED PROCEDURE J-splint apllication Left ankle (Left)   This patient was identified in the preop area and deemed okay for surgery. She was taken to the operating room for general anesthesia. We took the cas tremendous fracture blisters medially and laterally. It was obvious that surgery could not be performed so the patient was placed in sterile dressings with Xeroform a new sugar tong splint and anesthesia was reversed and she was taken to recovery room in stable condition with the postop plan as follows  Follow up on Wednesday check scan with the cast off and determine when the surgery can be done. I've made her family aware that we will check the skin once a week until I see it's okay to cut through it probably will take 2 weeks.  This patient is on high doses of oxycodone for chronic pain and will need a increase in dosing until her fracture can be repaired and then in the immediate postop period    SURGEON:  Surgeon(s) and Role:    * Carole Civil, MD - Primary  PHYSICIAN ASSISTANT:   ASSISTANTS: none   ANESTHESIA:   general  EBL:  Total I/O In: 1000 [I.V.:1000] Out: 0   BLOOD ADMINISTERED:none  DRAINS: none   LOCAL MEDICATIONS USED:  NONE  SPECIMEN:  No Specimen  DISPOSITION OF SPECIMEN:  N/A  COUNTS:  YES  TOURNIQUET:  * No tourniquets in log *  DICTATION: .Dragon Dictation  PLAN OF CARE: Discharge to home after PACU  PATIENT DISPOSITION:  PACU - hemodynamically stable.   Delay start of Pharmacological VTE agent (>24hrs) due to surgical blood loss or risk of bleeding: not applicable

## 2017-06-21 NOTE — Op Note (Signed)
06/21/2017  2:03 PM  [name]  61 y.o. female  PRE-OPERATIVE DIAGNOSIS:  Trimalleolar fracture left ankle  POST-OPERATIVE DIAGNOSIS:  Trimalleolar fracture left ankle  PROCEDURE:  Procedure(s): CANCELLED PROCEDURE J-splint apllication Left ankle (Left)   This patient was identified in the preop area and deemed okay for surgery. She was taken to the operating room for general anesthesia. We took the cas tremendous fracture blisters medially and laterally. It was obvious that surgery could not be performed so the patient was placed in sterile dressings with Xeroform a new sugar tong splint and anesthesia was reversed and she was taken to recovery room in stable condition with the postop plan as follows  Follow up on Wednesday check scan with the cast off and determine when the surgery can be done. I've made her family aware that we will check the skin once a week until I see it's okay to cut through it probably will take 2 weeks.  This patient is on high doses of oxycodone for chronic pain and will need a increase in dosing until her fracture can be repaired and then in the immediate postop period    SURGEON:  Surgeon(s) and Role:    * Carole Civil, MD - Primary  PHYSICIAN ASSISTANT:   ASSISTANTS: none   ANESTHESIA:   general  EBL:  Total I/O In: 1000 [I.V.:1000] Out: 0   BLOOD ADMINISTERED:none  DRAINS: none   LOCAL MEDICATIONS USED:  NONE  SPECIMEN:  No Specimen  DISPOSITION OF SPECIMEN:  N/A  COUNTS:  YES  TOURNIQUET:  * No tourniquets in log *  DICTATION: .Dragon Dictation  PLAN OF CARE: Discharge to home after PACU  PATIENT DISPOSITION:  PACU - hemodynamically stable.   Delay start of Pharmacological VTE agent (>24hrs) due to surgical blood loss or risk of bleeding: not applicable

## 2017-06-21 NOTE — Anesthesia Postprocedure Evaluation (Signed)
Anesthesia Post Note  Patient: [name] [name]  Procedure(s) Performed: Procedure(s) (LRB): CANCELLED PROCEDURE J-splint apllication Left ankle (Left)  Patient location during evaluation: PACU Anesthesia Type: General Level of consciousness: awake and alert, oriented and patient cooperative Pain management: pain level controlled Vital Signs Assessment: post-procedure vital signs reviewed and stable Respiratory status: spontaneous breathing and respiratory function stable Cardiovascular status: stable Postop Assessment: no apparent nausea or vomiting Anesthetic complications: no     Last Vitals:  Vitals:   06/21/17 1311 06/21/17 1405  BP: 129/79 (!) 113/99  Pulse:  (!) 101  Resp: 15 10  Temp:  37 C  SpO2: 100% 100%    Last Pain:  Vitals:   06/21/17 1405  TempSrc:   PainSc: 4                  ADAMS, AMY A

## 2017-06-21 NOTE — Brief Op Note (Signed)
06/21/2017  2:03 PM  [name]  61 y.o. female  PRE-OPERATIVE DIAGNOSIS:  Trimalleolar fracture left ankle  POST-OPERATIVE DIAGNOSIS:  Trimalleolar fracture left ankle  PROCEDURE:  Procedure(s): CANCELLED PROCEDURE J-splint apllication Left ankle (Left)   This patient was identified in the preop area and deemed okay for surgery. She was taken to the operating room for general anesthesia. We took the cas tremendous fracture blisters medially and laterally. It was obvious that surgery could not be performed so the patient was placed in sterile dressings with Xeroform a new sugar tong splint and anesthesia was reversed and she was taken to recovery room in stable condition with the postop plan as follows  Follow up on Wednesday check scan with the cast off and determine when the surgery can be done. I've made her family aware that we will check the skin once a week until I see it's okay to cut through it probably will take 2 weeks.  This patient is on high doses of oxycodone for chronic pain and will need a increase in dosing until her fracture can be repaired and then in the immediate postop period    SURGEON:  Surgeon(s) and Role:    * Carole Civil, MD - Primary  PHYSICIAN ASSISTANT:   ASSISTANTS: none   ANESTHESIA:   general  EBL:  Total I/O In: 1000 [I.V.:1000] Out: 0   BLOOD ADMINISTERED:none  DRAINS: none   LOCAL MEDICATIONS USED:  NONE  SPECIMEN:  No Specimen  DISPOSITION OF SPECIMEN:  N/A  COUNTS:  YES  TOURNIQUET:  * No tourniquets in log *  DICTATION: .Dragon Dictation  PLAN OF CARE: Discharge to home after PACU  PATIENT DISPOSITION:  PACU - hemodynamically stable.   Delay start of Pharmacological VTE agent (>24hrs) due to surgical blood loss or risk of bleeding: not applicable

## 2017-06-27 ENCOUNTER — Ambulatory Visit (INDEPENDENT_AMBULATORY_CARE_PROVIDER_SITE_OTHER): Payer: Medicaid Other

## 2017-06-27 ENCOUNTER — Ambulatory Visit (INDEPENDENT_AMBULATORY_CARE_PROVIDER_SITE_OTHER): Payer: Medicaid Other | Admitting: Orthopedic Surgery

## 2017-06-27 ENCOUNTER — Encounter: Payer: Self-pay | Admitting: Orthopedic Surgery

## 2017-06-27 DIAGNOSIS — S82852D Displaced trimalleolar fracture of left lower leg, subsequent encounter for closed fracture with routine healing: Secondary | ICD-10-CM | POA: Diagnosis not present

## 2017-06-27 MED ORDER — OXYCODONE HCL 20 MG PO TABS: 1.0000 | ORAL_TABLET | ORAL | 0 refills | Status: DC | PRN

## 2017-06-27 NOTE — Progress Notes (Signed)
Fracture care follow-up  Chief Complaint  Patient presents with  . Ankle Injury    06/20/17 6 days s/p ck skin extensive fracture blisters     The surgery was canceled last week because of severe fracture blistering  Today we took her splint off checked her skin she still has some blistering swelling is going down the skin is looking better though not quite ready for surgery  We repeated her x-ray or mortise is reduced and intact we refilled her pain medicine  She'll come back in a week we'll do the same procedure of looking at the skin splinting x-ray and determine if the skin is ready for surgery

## 2017-07-02 ENCOUNTER — Telehealth: Payer: Self-pay | Admitting: Orthopedic Surgery

## 2017-07-02 NOTE — Telephone Encounter (Signed)
Yes Elevate more apply ice more frequently  Call us back to morrow to report if any improvement

## 2017-07-02 NOTE — Telephone Encounter (Signed)
Pt states she is in a lot of pain and she states she has taken 1/2 of prescribed amount (20 mg) every 3 1/2 hours to help with her pain. She wants to know if she can take the prescribed amount (20 mg) every 3 hrs instead of 4 hrs.  Please advise

## 2017-07-04 ENCOUNTER — Ambulatory Visit (INDEPENDENT_AMBULATORY_CARE_PROVIDER_SITE_OTHER): Payer: Medicaid Other

## 2017-07-04 ENCOUNTER — Encounter: Payer: Self-pay | Admitting: Orthopedic Surgery

## 2017-07-04 ENCOUNTER — Ambulatory Visit (INDEPENDENT_AMBULATORY_CARE_PROVIDER_SITE_OTHER): Payer: Medicaid Other | Admitting: Orthopedic Surgery

## 2017-07-04 VITALS — BP 149/87 | HR 120 | Ht 63.0 in | Wt 160.0 lb

## 2017-07-04 DIAGNOSIS — S82852D Displaced trimalleolar fracture of left lower leg, subsequent encounter for closed fracture with routine healing: Secondary | ICD-10-CM

## 2017-07-04 MED ORDER — OXYCODONE HCL 30 MG PO TABS: 30.0000 mg | ORAL_TABLET | ORAL | 0 refills | Status: DC | PRN

## 2017-07-04 MED ORDER — NALOXONE HCL 4 MG/0.1ML NA LIQD: 1.0000 | Freq: Once | NASAL | 0 refills | Status: AC

## 2017-07-04 NOTE — Progress Notes (Signed)
Progress Note   Patient ID: [name] [name], female   DOB: 1955-09-30, 61 y.o.   MRN: [id number]  Chief Complaint  Patient presents with  . Routine Post Op    Left ankle    61 year old female comes in for skin check. She has a trimalleolar fracture of her left ankle. She has multiple blisters. She reports increasing pain and loosening of her splint       Review of Systems  Respiratory: Negative for shortness of breath.   Gastrointestinal: Negative for abdominal pain, nausea and vomiting.   Current Meds  Medication Sig  . amphetamine-dextroamphetamine (ADDERALL) 30 MG tablet 30 mg. Take one half to one tablet by mouth once daily  . aspirin (ECOTRIN) 325 MG EC tablet Take 1 tablet (325 mg total) by mouth daily.  . cyclobenzaprine (FLEXERIL) 10 MG tablet Take 10 mg by mouth at bedtime.   . diclofenac sodium (VOLTAREN) 1 % GEL Apply 4 g topically 4 (four) times daily.   Mariane Baumgarten Sodium (DULCOLAX STOOL SOFTENER PO) Take 300 mg by mouth daily.   Marland Kitchen ibuprofen (ADVIL) 200 MG tablet 3 tablets daily as needed.  . ondansetron (ZOFRAN) 8 MG tablet Take 1 tablet (8 mg total) by mouth every 4 (four) hours as needed for nausea or vomiting.  . polyethylene glycol powder (GLYCOLAX/MIRALAX) powder Take 17 g by mouth daily as needed for mild constipation or moderate constipation.  . promethazine (PHENERGAN) 25 MG suppository UNWRAP AND INSERT ONE SUPPOSITORY RECTALLY EVERY 6 HOURS AS NEEDED FOR NAUSEA.  Marland Kitchen venlafaxine XR (EFFEXOR-XR) 150 MG 24 hr capsule Take 300 mg by mouth daily with breakfast.   . zolpidem (AMBIEN) 5 MG tablet Take 10 mg by mouth at bedtime as needed for sleep.  . [DISCONTINUED] Oxycodone HCl 20 MG TABS Take 1 tablet (20 mg total) by mouth every 4 (four) hours as needed.     Physical Exam BP (!) 149/87   Pulse (!) 120   Ht 5\' 3"  (1.6 m)   Wt 160 lb (72.6 kg)   BMI 28.34 kg/m   Gen. appearance the patient's appearance is normal with normal grooming and  hygiene The patient  is oriented to person place and time Mood and affect reveal anxiety   BP (!) 149/87   Pulse (!) 120   Ht 5\' 3"  (1.6 m)   Wt 160 lb (72.6 kg)   BMI 28.34 kg/m  Ortho Exam  Splint was removed and blisters remaining were released. The skin is a wrinkling on the dorsum of the foot. The medial blister is still not quite what I would like. The lateral side looks amenable to surgery  I re-x-rayed her ankle and put her in a new splint. The ankle is reduced in the mortise  She is ready for surgery for open treatment internal fixation left ankle trimalleolar fracture  Medical decision-making Encounter Diagnosis  Name Primary?  . Trimalleolar fracture of ankle, closed, left, with routine healing, subsequent encounter Yes      Meds ordered this encounter  Medications  . oxycodone (ROXICODONE) 30 MG immediate release tablet    Sig: Take 1 tablet (30 mg total) by mouth every 4 (four) hours as needed for pain.    Dispense:  30 tablet    Refill:  0  . naloxone (NARCAN) nasal spray 4 mg/0.1 mL    Sig: Place 1 spray into the nose once.    Dispense:  4 mg    Refill:  0    Use  for overdose     Arther Abbott, MD 07/04/2017 5:24 PM

## 2017-07-05 ENCOUNTER — Encounter: Payer: Self-pay | Admitting: *Deleted

## 2017-07-05 NOTE — Progress Notes (Signed)
No Precert required for CPT 27822 scheduled for 07/12/17. Patient informed of pre-op date and time.

## 2017-07-06 NOTE — Patient Instructions (Signed)
[name] [name]  07/06/2017     @PREFPERIOPPHARMACY @   Your procedure is scheduled on  07/12/2017 .  Report to [facility] at  830  A.M.  Call this number if you have problems the morning of surgery:  [phone]   Remember:  Do not eat food or drink liquids after midnight.  Take these medicines the morning of surgery with A SIP OF WATER  Adderall, flexaril, zofran or phenergan, oxycodone, effexor.   Do not wear jewelry, make-up or nail polish.  Do not wear lotions, powders, or perfumes, or deoderant.  Do not shave 48 hours prior to surgery.  Men may shave face and neck.  Do not bring valuables to the hospital.  [facility] is not responsible for any belongings or valuables.  Contacts, dentures or bridgework may not be worn into surgery.  Leave your suitcase in the car.  After surgery it may be brought to your room.  For patients admitted to the hospital, discharge time will be determined by your treatment team.  Patients discharged the day of surgery will not be allowed to drive home.   Name and phone number of your driver:   family Special instructions:  None  Please read over the following fact sheets that you were given. Anesthesia Post-op Instructions and Care and Recovery After Surgery       Displaced Bimalleolar Ankle Fracture Treated With ORIF A bimalleolar fracture is two breaks (fractures) in the lower bones of the leg that help to make up the ankle. These fractures are in the far ends of the bone that you feel as the bump on the outside of the ankle (fibula) and the bone that you feel as the bump on the inside of the ankle (tibia). The fracture is displaced. This means that the bones are not lined up correctly. The bones will be put back into position with a procedure that is called open reduction with internal fixation (ORIF). A combination of screws, screws and a metal plate, or different types of wiring will be used to hold the bones in  place. Tell a health care provider about:  Any allergies you have.  All medicines you are taking, including vitamins, herbs, eye drops, creams, and over-the-counter medicines.  Any problems you or family members have had with anesthetic medicines.  Any blood disorders you have.  Any surgeries you have had.  Any medical conditions you have, including the possibility of pregnancy. What are the risks? Generally, this is a safe procedure. However, problems may occur, including:  Excessive bleeding.  Infection.  Failure of the fracture to heal.  Long-term pain and stiffness (arthritis).  Stiffness of the ankle after the repair.  What happens before the procedure?  Ask your health care provider about: ? Changing or stopping your regular medicines. This is especially important if you are taking diabetes medicines or blood thinners. ? Taking medicines such as aspirin and ibuprofen. These medicines can thin your blood. Do not take these medicines before your procedure if your health care provider instructs you not to.  Follow instructions from your health care provider about eating or drinking restrictions.  Plan to have someone take you home after the procedure.  If you go home right after the procedure, plan to have someone with you for 24 hours.  Ask your health care provider how your surgical site will be marked or identified.  You may be given antibiotic medicine to help  prevent infection. What happens during the procedure?  To reduce your risk of infection: ? Your health care team will wash or sanitize their hands. ? Your skin will be washed with soap.  An IV tube will be inserted into one of your veins. Medicine will flow directly into your body through this tube. You may be given antibiotic medicine and medicine for pain through the IV tube.  You will be given one or more of the following: ? A medicine that numbs the area (local anesthetic). ? A medicine that makes  you fall asleep (general anesthetic). ? A medicine that is injected into your spine that numbs the area below and slightly above the injection site (spinal anesthetic). ? A medicine that is injected into an area of your body that numbs everything below the injection site (regional anesthetic).  The surgeon will make an incision through your skin to expose the areas of the fracture.  The broken bones will be put back into their normal positions. The surgeon will use a combination of screws, screws and a metal plate, or different types of wiring to hold the bones in place.  After the bones are back in place, the surgeon will close the incision using stitches (sutures) or staples.  A bandage (dressing) and a cast, splint, or supportive boot will be placed over your ankle. The procedure may vary among health care providers and hospitals. What happens after the procedure?  Your blood pressure, heart rate, breathing rate, and blood oxygen level will be monitored often until the medicines you were given have worn off.  You may be given medicine to control the pain. This information is not intended to replace advice given to you by your health care provider. Make sure you discuss any questions you have with your health care provider. Document Released: 06/21/2005 Document Revised: 02/17/2016 Document Reviewed: 08/05/2014 Elsevier Interactive Patient Education  2018 [place]. Displaced Bimalleolar Ankle Fracture Treated With ORIF, Care After Refer to this sheet in the next few weeks. These instructions provide you with information about caring for yourself after your procedure. Your health care provider may also give you more specific instructions. Your treatment has been planned according to current medical practices, but problems sometimes occur. Call your health care provider if you have any problems or questions after your procedure. What can I expect after the procedure? After the procedure,  it is common to have:  Pain.  Swelling.  Follow these instructions at home: If you have a cast:  Do not stick anything inside the cast to scratch your skin. Doing that increases your risk of infection.  Check the skin around the cast every day. Report any concerns to your health care provider. You may put lotion on dry skin around the edges of the cast. Do not apply lotion to the skin underneath the cast.  Do not put pressure on any part of the cast until it is fully hardened. This may take several hours.  Keep the cast clean and dry. If you have a splint or boot:  Wear it as directed by your health care provider. Remove it only as directed by your health care provider.  Loosen the splint or boot if your toes become numb and tingle, or if they turn cold and blue.  Do not pressure on any part of the splint or boot until it is fully hardened. This may take several hours.  Keep the splint or boot clean and dry. Bathing  Do not take  baths, swim, or use a hot tub until your health care provider approves. Ask your health care provider if you can take showers. You may only be allowed to take sponge baths for bathing.  If your health care provider approves bathing and showering, cover the cast or splint with a watertight plastic bag to protect it from water. Do not let the cast or splint get wet.  Keep the bandage (dressing) dry until your health care provider says it can be removed. Incision care  There are many ways to close and cover an incision. For example, an incision can be closed with stitches (sutures), skin glue, or adhesive strips. Follow instructions from your health care provider about: ? How to take care of your incision. ? When and how you should change your bandage (dressing). ? When you should remove your dressing. ? Removing whatever was used to close your incision.  Check your incision area every day for signs of infection. Watch for: ? Redness, swelling, or  pain. ? Fluid, blood, or pus. Managing pain, stiffness, and swelling  Move your toes often to avoid stiffness and to lessen swelling.  Raise (elevate) the injured area above the level of your heart while you are sitting or lying down.  If directed, apply ice to the injured area (if you have a splint or a boot, not a cast). ? Put ice in a plastic bag. ? Place a towel between your skin and the bag. ? Leave the ice on for 20 minutes, 2-3 times per day. Driving  Do not drive or operate heavy machinery while taking pain medicine.  Do not drive while wearing a cast, splint, or boot on a foot that you use for driving. Activity  Return to your normal activities as directed by your health care provider. Ask your health care provider what activities are safe for you.  Perform exercises daily as directed by your health care provider or physical therapist. Safety  Do not walk on the injured ankle and do not use it to support your body weight until your health care provider says that you can. Follow these instructions exactly to prevent problems. Use crutches or other supportive devices as directed by your health care provider. General instructions  Do not use any tobacco products, including cigarettes, chewing tobacco, or e-cigarettes. Tobacco can delay bone healing. If you need help quitting, ask your health care provider.  Take medicines only as directed by your health care provider.  Keep all follow-up visits as directed by your health care provider. This is important. Contact a health care provider if:  You have a fever.  Your pain medicine is not helping.  You have redness, swelling, or pain at the site of your incision.  You have fluid, blood, or pus coming from your incision or seeping through your cast.  You notice a bad smell coming from your incision or your dressing. Get help right away if:  You have chest pain.  You have difficulty breathing.  You have numbness or  tingling in your foot or leg.  Your foot becomes cold, pale, or blue. This information is not intended to replace advice given to you by your health care provider. Make sure you discuss any questions you have with your health care provider. Document Released: 03/31/2005 Document Revised: 02/17/2016 Document Reviewed: 08/05/2014 Elsevier Interactive Patient Education  2018 [place] Anesthesia, Adult General anesthesia is the use of medicines to make a person "go to sleep" (be unconscious) for  a medical procedure. General anesthesia is often recommended when a procedure:  Is long.  Requires you to be still or in an unusual position.  Is major and can cause you to lose blood.  Is impossible to do without general anesthesia.  The medicines used for general anesthesia are called general anesthetics. In addition to making you sleep, the medicines:  Prevent pain.  Control your blood pressure.  Relax your muscles.  Tell a health care provider about:  Any allergies you have.  All medicines you are taking, including vitamins, herbs, eye drops, creams, and over-the-counter medicines.  Any problems you or family members have had with anesthetic medicines.  Types of anesthetics you have had in the past.  Any bleeding disorders you have.  Any surgeries you have had.  Any medical conditions you have.  Any history of heart or lung conditions, such as heart failure, sleep apnea, or chronic obstructive pulmonary disease (COPD).  Whether you are pregnant or may be pregnant.  Whether you use tobacco, alcohol, marijuana, or street drugs.  Any history of Armed forces logistics/support/administrative officer.  Any history of depression or anxiety. What are the risks? Generally, this is a safe procedure. However, problems may occur, including:  Allergic reaction to anesthetics.  Lung and heart problems.  Inhaling food or liquids from your stomach into your lungs (aspiration).  Injury to  nerves.  Waking up during your procedure and being unable to move (rare).  Extreme agitation or a state of mental confusion (delirium) when you wake up from the anesthetic.  Air in the bloodstream, which can lead to stroke.  These problems are more likely to develop if you are having a major surgery or if you have an advanced medical condition. You can prevent some of these complications by answering all of your health care provider's questions thoroughly and by following all pre-procedure instructions. General anesthesia can cause side effects, including:  Nausea or vomiting  A sore throat from the breathing tube.  Feeling cold or shivery.  Feeling tired, washed out, or achy.  Sleepiness or drowsiness.  Confusion or agitation.  What happens before the procedure? Staying hydrated Follow instructions from your health care provider about hydration, which may include:  Up to 2 hours before the procedure - you may continue to drink clear liquids, such as water, clear fruit juice, black coffee, and plain tea.  Eating and drinking restrictions Follow instructions from your health care provider about eating and drinking, which may include:  8 hours before the procedure - stop eating heavy meals or foods such as meat, fried foods, or fatty foods.  6 hours before the procedure - stop eating light meals or foods, such as toast or cereal.  6 hours before the procedure - stop drinking milk or drinks that contain milk.  2 hours before the procedure - stop drinking clear liquids.  Medicines  Ask your health care provider about: ? Changing or stopping your regular medicines. This is especially important if you are taking diabetes medicines or blood thinners. ? Taking medicines such as aspirin and ibuprofen. These medicines can thin your blood. Do not take these medicines before your procedure if your health care provider instructs you not to. ? Taking new dietary supplements or  medicines. Do not take these during the week before your procedure unless your health care provider approves them.  If you are told to take a medicine or to continue taking a medicine on the day of the procedure, take the medicine with  sips of water. General instructions   Ask if you will be going home the same day, the following day, or after a longer hospital stay. ? Plan to have someone take you home. ? Plan to have someone stay with you for the first 24 hours after you leave the hospital or clinic.  For 3-6 weeks before the procedure, try not to use any tobacco products, such as cigarettes, chewing tobacco, and e-cigarettes.  You may brush your teeth on the morning of the procedure, but make sure to spit out the toothpaste. What happens during the procedure?  You will be given anesthetics through a mask and through an IV tube in one of your veins.  You may receive medicine to help you relax (sedative).  As soon as you are asleep, a breathing tube may be used to help you breathe.  An anesthesia specialist will stay with you throughout the procedure. He or she will help keep you comfortable and safe by continuing to give you medicines and adjusting the amount of medicine that you get. He or she will also watch your blood pressure, pulse, and oxygen levels to make sure that the anesthetics do not cause any problems.  If a breathing tube was used to help you breathe, it will be removed before you wake up. The procedure may vary among health care providers and hospitals. What happens after the procedure?  You will wake up, often slowly, after the procedure is complete, usually in a recovery area.  Your blood pressure, heart rate, breathing rate, and blood oxygen level will be monitored until the medicines you were given have worn off.  You may be given medicine to help you calm down if you feel anxious or agitated.  If you will be going home the same day, your health care provider may  check to make sure you can stand, drink, and urinate.  Your health care providers will treat your pain and side effects before you go home.  Do not drive for 24 hours if you received a sedative.  You may: ? Feel nauseous and vomit. ? Have a sore throat. ? Have mental slowness. ? Feel cold or shivery. ? Feel sleepy. ? Feel tired. ? Feel sore or achy, even in parts of your body where you did not have surgery. This information is not intended to replace advice given to you by your health care provider. Make sure you discuss any questions you have with your health care provider. Document Released: 12/19/2007 Document Revised: 02/22/2016 Document Reviewed: 08/26/2015 Elsevier Interactive Patient Education  2018 [place] Anesthesia, Adult, Care After These instructions provide you with information about caring for yourself after your procedure. Your health care provider may also give you more specific instructions. Your treatment has been planned according to current medical practices, but problems sometimes occur. Call your health care provider if you have any problems or questions after your procedure. What can I expect after the procedure? After the procedure, it is common to have:  Vomiting.  A sore throat.  Mental slowness.  It is common to feel:  Nauseous.  Cold or shivery.  Sleepy.  Tired.  Sore or achy, even in parts of your body where you did not have surgery.  Follow these instructions at home: For at least 24 hours after the procedure:  Do not: ? Participate in activities where you could fall or become injured. ? Drive. ? Use heavy machinery. ? Drink alcohol. ? Take sleeping pills or medicines  that cause drowsiness. ? Make important decisions or sign legal documents. ? Take care of children on your own.  Rest. Eating and drinking  If you vomit, drink water, juice, or soup when you can drink without vomiting.  Drink enough fluid to keep your  urine clear or pale yellow.  Make sure you have little or no nausea before eating solid foods.  Follow the diet recommended by your health care provider. General instructions  Have a responsible adult stay with you until you are awake and alert.  Return to your normal activities as told by your health care provider. Ask your health care provider what activities are safe for you.  Take over-the-counter and prescription medicines only as told by your health care provider.  If you smoke, do not smoke without supervision.  Keep all follow-up visits as told by your health care provider. This is important. Contact a health care provider if:  You continue to have nausea or vomiting at home, and medicines are not helpful.  You cannot drink fluids or start eating again.  You cannot urinate after 8-12 hours.  You develop a skin rash.  You have fever.  You have increasing redness at the site of your procedure. Get help right away if:  You have difficulty breathing.  You have chest pain.  You have unexpected bleeding.  You feel that you are having a life-threatening or urgent problem. This information is not intended to replace advice given to you by your health care provider. Make sure you discuss any questions you have with your health care provider. Document Released: 12/18/2000 Document Revised: 02/14/2016 Document Reviewed: 08/26/2015 Elsevier Interactive Patient Education  [employer].

## 2017-07-09 ENCOUNTER — Encounter (HOSPITAL_COMMUNITY)
Admission: RE | Admit: 2017-07-09 | Discharge: 2017-07-09 | Disposition: A | Discharge status: Home / Self Care | Payer: Medicaid Other | Source: Ambulatory Visit | Attending: Orthopedic Surgery | Admitting: Orthopedic Surgery

## 2017-07-10 ENCOUNTER — Telehealth: Payer: Self-pay | Admitting: Radiology

## 2017-07-10 NOTE — Telephone Encounter (Signed)
I have gotten a prior auth request for patients 30mg  Oxycodone IR, you have written this for her q 4 hours. We need to discuss/ review this for me to fill out proper request to get this covered by her Medicaid

## 2017-07-11 ENCOUNTER — Encounter: Payer: Self-pay | Admitting: Radiology

## 2017-07-11 ENCOUNTER — Encounter (HOSPITAL_COMMUNITY)
Admission: RE | Admit: 2017-07-11 | Discharge: 2017-07-11 | Disposition: A | Discharge status: Home / Self Care | Payer: Medicaid Other | Source: Ambulatory Visit | Attending: Orthopedic Surgery | Admitting: Orthopedic Surgery

## 2017-07-11 ENCOUNTER — Other Ambulatory Visit: Payer: Self-pay

## 2017-07-11 ENCOUNTER — Encounter (HOSPITAL_COMMUNITY): Payer: Self-pay

## 2017-07-11 DIAGNOSIS — Z01818 Encounter for other preprocedural examination: Secondary | ICD-10-CM | POA: Insufficient documentation

## 2017-07-11 HISTORY — DX: Personal history of urinary calculi: Z87.442

## 2017-07-11 LAB — SURGICAL PCR SCREEN
MRSA, PCR: NEGATIVE
Staphylococcus aureus: NEGATIVE

## 2017-07-11 NOTE — H&P (Signed)
History and physical for surgery patient came back to the office and had her skin checked. The lateral side looks fine for surgery 5 be assessed at the time of patient is aware that she may only get lateral surgery       Chief Complaint  Patient presents with  . Follow-up      Left ankle fracture DOI 06/19/17      61 year old female fell on September 25 at her home. She has chronic pain syndrome, she has an unknown or nonfunctioning spinal stimulator. She is on oxycodone 15 mg every 6 hours when necessary for pain and sees a pain management clinic specialist   She sustained a trimalleolar fracture dislocation of her left ankle which was close reduced successfully in emergency room the day of injury.   She comes in complaining of severe pain throbbing numbness in her toes with dull stabbing aching severe pain which is constant seems to be worse if she hangs her foot down associated with swelling all involving the left ankle. She does not have any other complaints of pain or injury     Review of Systems  Constitutional: Negative for chills and fever.  HENT: Negative for hearing loss.   Eyes: Negative for blurred vision.  Respiratory: Negative for shortness of breath.   Cardiovascular: Negative for chest pain.  Musculoskeletal: Positive for back pain and neck pain.  Skin: Negative for rash.  Neurological: Positive for tingling and sensory change. Negative for focal weakness.  Endo/Heme/Allergies: Negative for polydipsia. Does not bruise/bleed easily.  Psychiatric/Behavioral: Negative for memory loss and substance abuse.  All other systems reviewed and are negative.           Past Medical History:  Diagnosis Date  . ABDOMINAL PAIN, LOWER 02/16/2009  . APPENDECTOMY, HX OF 09/11/2007  . ATTENTION DEFICIT DISORDER, ADULT 02/02/2009  . BACK PAIN 07/16/2007    chronic  . CERVICAL RADICULOPATHY, RIGHT 07/16/2007  . CHEST PAIN, ACUTE 09/03/2008  . Chronic abdominal pain    . Chronic  pain syndrome 09/11/2007  . Complication of anesthesia      woke up during surgeries few times and woke up during endoscopy and colonscopy few weeks ago, in terrible pain  . DEPRESSION/ANXIETY 06/17/2010  . Dysuria 02/20/2008  . Elevated liver enzymes 1 and  1/2 months ago  . Family history of anesthesia complication      mother woke up during surgeries  . Fibromyalgia    . Functional GI symptoms    . FX, RAMUS NOS, CLOSED 07/16/2007  . GERD (gastroesophageal reflux disease)    . H/O failed conscious sedation      PT STATES SHE IS HARD TO SEDATE!  . HEMORRHOIDS 09/11/2007  . HIATAL HERNIA, HX OF 09/11/2007  . HX, PERSONAL, MUSCULOSKELETAL DISORD NEC 07/16/2007  . HX, PERSONAL, URINARY CALCULI 07/16/2007  . Irritable bowel syndrome 09/11/2007  . NEOPLASM, SKIN, UNCERTAIN BEHAVIOR 03/01/1244  . OSTEOARTHRITIS 09/11/2007    oa  . OSTEOPENIA 10/23/2007  . PONV (postoperative nausea and vomiting)    . ROTATOR CUFF REPAIR, HX OF 09/11/2007  . Suicidal ideations    . SYMPTOM, PAIN, ABDOMINAL, RIGHT UP QUADRANT 07/16/2007  . TAH/BSO, HX OF 09/11/2007  . UTI 10/06/2010  . VAGINITIS, ATROPHIC, POSTMENOPAUSAL 07/16/2007           Past Surgical History:  Procedure Laterality Date  . ABDOMINAL HYSTERECTOMY      . APPENDECTOMY      . bone pushed back into  place  after fracture Left 25-30 yrs ago    face  . cervical radiculopathy   20 yrs ago    RT  . CHOLECYSTECTOMY N/A 02/18/2014    Procedure: LAPAROSCOPIC CHOLECYSTECTOMY;  Surgeon: Harl Bowie, MD;  Location: [facility];  Service: General;  Laterality: N/A;  . COLONOSCOPY      . DILATION AND CURETTAGE OF UTERUS   age 86  . ESOPHAGOGASTRODUODENOSCOPY      . ROTATOR CUFF REPAIR        right  . SPINAL CORD STIMULATOR IMPLANT   6-7 yrs ago    lower back, pt turned off 2-3 weeks ago due to causing pain  . TOTAL ABDOMINAL HYSTERECTOMY W/ BILATERAL SALPINGOOPHORECTOMY      . TUBAL LIGATION               Family History  Problem  Relation Age of Onset  . Heart disease Mother          CAD/ valvular disease  . Kidney disease Mother          lost a kidney -  unknown cause  . Heart disease Brother          CAD  . COPD Father    . Heart disease Father 49        MI  . Heart disease Sister    . Cancer Other          Colon  . Heart disease Maternal Aunt    . Heart disease Maternal Grandmother    . Colon cancer Cousin      Social History  Substance Use Topics  . Smoking status: Never Smoker  . Smokeless tobacco: Never Used  . Alcohol use No      BP (!) 141/84   Pulse 95 respiratory rate 18   Physical Exam  Constitutional: She is oriented to person, place, and time. She appears well-developed and well-nourished.  Non-toxic appearance. She appears distressed.  Eyes: Conjunctivae, EOM and lids are normal. Right eye exhibits no discharge and no exudate. Left eye exhibits no discharge and no exudate. Right conjunctiva is not injected. Left conjunctiva is not injected.  Neck: Trachea normal and full passive range of motion without pain. Neck supple. No neck rigidity. No tracheal deviation and normal range of motion present. No thyroid mass and no thyromegaly present.  Cardiovascular: Intact distal pulses.   Pulses:      Radial pulses are 2+ on the right side, and 2+ on the left side.       Dorsalis pedis pulses are 2+ on the right side, and 2+ on the left side.  Musculoskeletal:  Right and left upper extremity normal range of motion stability strength alignment skin pulse without lymphadenopathy sensory deficit pathologic reflexes and normal coordination  Right lower extremity normal range of motion strength stability alignment skin pulse no lymphadenopathy normal sensation no pathologic reflexes normal coordination  Left leg is in a splint dorsal pedis normal sensory change dorsum of the foot none toes decreased sensation limb alignment normal range of motion deferred stability deferred muscle tone normal lymph  nodes deferred skin sensation as stated no pathologic reflexes and coordination balance deferred  Lymphadenopathy:    She has no cervical adenopathy.       Right cervical: No superficial cervical adenopathy present.      Left cervical: No superficial cervical adenopathy present.    She has no axillary adenopathy.       Right: No supraclavicular adenopathy present.  Left: No supraclavicular adenopathy present.  Neurological: She is alert and oriented to person, place, and time. She is not disoriented. She displays no atrophy and no tremor. A sensory deficit is present. She exhibits normal muscle tone. Gait abnormal. She displays no Babinski's sign on the right side. She displays no Babinski's sign on the left side.  Reflex Scores:      Bicep reflexes are 2+ on the right side and 2+ on the left side.      Patellar reflexes are 2+ on the right side and 2+ on the left side. Skin: Skin is warm, dry and intact. Capillary refill takes less than 2 seconds. No abrasion, no bruising, no ecchymosis, no laceration and no rash noted. She is not diaphoretic. No erythema.  Psychiatric: Her speech is normal and behavior is normal. Judgment and thought content normal.  Mood and affect flat depressed She is attentive.  Vitals reviewed.     Ortho Exam update Splint was removed and blisters remaining were released. The skin is a wrinkling on the dorsum of the foot. The medial blister is still not quite what I would like. The lateral side looks amenable to surgery   I re-x-rayed her ankle and put her in a new splint. The ankle is reduced in the mortise            Encounter Diagnoses  Name Primary?  . Closed fracture of left ankle, initial encounter Yes  . Trimalleolar fracture of ankle, closed, left, initial encounter      She has a trimalleolar fracture large posterior fragment   This will need CAT scan to determine if front and back surgery needed.       PLAN:    The patient is sent for CAT  scan at [facility]. Surgery scheduled for Thursday.   The procedure has been fully reviewed with the patient; The risks and benefits of surgery have been discussed and explained and understood. Alternative treatment has also been reviewed, questions were encouraged and answered. The postoperative plan is also been reviewed.   I told her ankle will not be normal she'll have stiffness you have changes when the weather occurs with pain and she will also have risk of arthritis   She will need further pain management and I took that overall she is having surgery. I increased her oxycodone 20 mg every 4

## 2017-07-12 ENCOUNTER — Ambulatory Visit (HOSPITAL_COMMUNITY): Payer: Medicaid Other | Admitting: Anesthesiology

## 2017-07-12 ENCOUNTER — Ambulatory Visit (HOSPITAL_COMMUNITY): Payer: Medicaid Other

## 2017-07-12 ENCOUNTER — Observation Stay (HOSPITAL_COMMUNITY)
Admission: RE | Admit: 2017-07-12 | Discharge: 2017-07-14 | Disposition: A | Discharge status: Home / Self Care | Payer: Medicaid Other | Source: Ambulatory Visit | Attending: Orthopedic Surgery | Admitting: Orthopedic Surgery

## 2017-07-12 ENCOUNTER — Encounter (HOSPITAL_COMMUNITY)
Admission: RE | Disposition: A | Discharge status: Home / Self Care | Payer: Self-pay | Source: Ambulatory Visit | Attending: Orthopedic Surgery

## 2017-07-12 ENCOUNTER — Encounter (HOSPITAL_COMMUNITY): Payer: Self-pay | Admitting: *Deleted

## 2017-07-12 DIAGNOSIS — S8262XA Displaced fracture of lateral malleolus of left fibula, initial encounter for closed fracture: Secondary | ICD-10-CM

## 2017-07-12 DIAGNOSIS — M1990 Unspecified osteoarthritis, unspecified site: Secondary | ICD-10-CM | POA: Insufficient documentation

## 2017-07-12 DIAGNOSIS — G894 Chronic pain syndrome: Secondary | ICD-10-CM | POA: Diagnosis not present

## 2017-07-12 DIAGNOSIS — F329 Major depressive disorder, single episode, unspecified: Secondary | ICD-10-CM | POA: Diagnosis not present

## 2017-07-12 DIAGNOSIS — S82852A Displaced trimalleolar fracture of left lower leg, initial encounter for closed fracture: Secondary | ICD-10-CM | POA: Diagnosis not present

## 2017-07-12 DIAGNOSIS — Z880 Allergy status to penicillin: Secondary | ICD-10-CM | POA: Diagnosis not present

## 2017-07-12 DIAGNOSIS — Z79899 Other long term (current) drug therapy: Secondary | ICD-10-CM | POA: Diagnosis not present

## 2017-07-12 DIAGNOSIS — M797 Fibromyalgia: Secondary | ICD-10-CM | POA: Diagnosis not present

## 2017-07-12 DIAGNOSIS — K219 Gastro-esophageal reflux disease without esophagitis: Secondary | ICD-10-CM | POA: Diagnosis not present

## 2017-07-12 DIAGNOSIS — S82852D Displaced trimalleolar fracture of left lower leg, subsequent encounter for closed fracture with routine healing: Secondary | ICD-10-CM

## 2017-07-12 DIAGNOSIS — F419 Anxiety disorder, unspecified: Secondary | ICD-10-CM | POA: Insufficient documentation

## 2017-07-12 HISTORY — PX: ORIF ANKLE FRACTURE: SHX5408

## 2017-07-12 SURGERY — OPEN REDUCTION INTERNAL FIXATION (ORIF) ANKLE FRACTURE
Anesthesia: General | Laterality: Left

## 2017-07-12 MED ORDER — ROCURONIUM BROMIDE 50 MG/5ML IV SOLN
INTRAVENOUS | Status: AC
  Filled 2017-07-12: qty 1

## 2017-07-12 MED ORDER — MIDAZOLAM HCL 2 MG/2ML IJ SOLN
INTRAMUSCULAR | Status: AC
  Filled 2017-07-12: qty 2

## 2017-07-12 MED ORDER — CYCLOBENZAPRINE HCL 10 MG PO TABS
10.0000 mg | ORAL_TABLET | Freq: Three times a day (TID) | ORAL | Status: DC | PRN
  Administered 2017-07-12 – 2017-07-14 (×5): 10 mg via ORAL
  Filled 2017-07-12 (×5): qty 1

## 2017-07-12 MED ORDER — FENTANYL CITRATE (PF) 250 MCG/5ML IJ SOLN
INTRAMUSCULAR | Status: AC
  Filled 2017-07-12: qty 5

## 2017-07-12 MED ORDER — IBUPROFEN 400 MG PO TABS
400.0000 mg | ORAL_TABLET | Freq: Four times a day (QID) | ORAL | Status: DC | PRN
  Administered 2017-07-12 – 2017-07-14 (×4): 400 mg via ORAL
  Filled 2017-07-12 (×4): qty 1

## 2017-07-12 MED ORDER — MIDAZOLAM HCL 5 MG/5ML IJ SOLN
INTRAMUSCULAR | Status: DC | PRN
  Administered 2017-07-12 (×2): 1 mg via INTRAVENOUS

## 2017-07-12 MED ORDER — ACETAMINOPHEN 325 MG PO TABS
650.0000 mg | ORAL_TABLET | Freq: Four times a day (QID) | ORAL | Status: DC | PRN
  Administered 2017-07-13: 650 mg via ORAL
  Filled 2017-07-12: qty 2

## 2017-07-12 MED ORDER — HYDROMORPHONE HCL 1 MG/ML IJ SOLN
0.2500 mg | INTRAMUSCULAR | Status: DC | PRN
  Administered 2017-07-12 (×2): 0.5 mg via INTRAVENOUS

## 2017-07-12 MED ORDER — OXYCODONE HCL 5 MG PO TABS
ORAL_TABLET | ORAL | Status: AC
Start: 2017-07-12 — End: ?
  Filled 2017-07-12: qty 3

## 2017-07-12 MED ORDER — OXYCODONE HCL 5 MG PO TABS
30.0000 mg | ORAL_TABLET | ORAL | Status: DC | PRN
  Administered 2017-07-12 – 2017-07-13 (×4): 30 mg via ORAL
  Filled 2017-07-12 (×5): qty 6

## 2017-07-12 MED ORDER — FENTANYL CITRATE (PF) 100 MCG/2ML IJ SOLN
INTRAMUSCULAR | Status: DC | PRN
  Administered 2017-07-12 (×10): 50 ug via INTRAVENOUS

## 2017-07-12 MED ORDER — VANCOMYCIN HCL IN DEXTROSE 1-5 GM/200ML-% IV SOLN
1000.0000 mg | Freq: Two times a day (BID) | INTRAVENOUS | Status: AC
  Administered 2017-07-12: 1000 mg via INTRAVENOUS
  Filled 2017-07-12: qty 200

## 2017-07-12 MED ORDER — ONDANSETRON HCL 4 MG/2ML IJ SOLN
4.0000 mg | Freq: Four times a day (QID) | INTRAMUSCULAR | Status: DC
  Administered 2017-07-12: 4 mg via INTRAVENOUS

## 2017-07-12 MED ORDER — PROPOFOL 10 MG/ML IV BOLUS
INTRAVENOUS | Status: DC | PRN
  Administered 2017-07-12: 200 mg via INTRAVENOUS

## 2017-07-12 MED ORDER — MENTHOL 3 MG MT LOZG: 1.0000 | LOZENGE | OROMUCOSAL | Status: DC | PRN

## 2017-07-12 MED ORDER — ZOLPIDEM TARTRATE 5 MG PO TABS
5.0000 mg | ORAL_TABLET | Freq: Every evening | ORAL | Status: DC | PRN
  Administered 2017-07-13: 5 mg via ORAL
  Filled 2017-07-12: qty 1

## 2017-07-12 MED ORDER — BUPIVACAINE-EPINEPHRINE (PF) 0.5% -1:200000 IJ SOLN
INTRAMUSCULAR | Status: DC | PRN
  Administered 2017-07-12: 60 mL

## 2017-07-12 MED ORDER — KETOROLAC TROMETHAMINE 30 MG/ML IJ SOLN
INTRAMUSCULAR | Status: AC
  Filled 2017-07-12: qty 1

## 2017-07-12 MED ORDER — 0.9 % SODIUM CHLORIDE (POUR BTL) OPTIME
TOPICAL | Status: DC | PRN
  Administered 2017-07-12: 1000 mL

## 2017-07-12 MED ORDER — ONDANSETRON HCL 4 MG/2ML IJ SOLN
INTRAMUSCULAR | Status: AC
  Filled 2017-07-12: qty 2

## 2017-07-12 MED ORDER — MIDAZOLAM HCL 2 MG/2ML IJ SOLN
1.0000 mg | INTRAMUSCULAR | Status: DC | PRN
  Administered 2017-07-12: 2 mg via INTRAVENOUS

## 2017-07-12 MED ORDER — METOCLOPRAMIDE HCL 5 MG/ML IJ SOLN: 5.0000 mg | Freq: Three times a day (TID) | INTRAMUSCULAR | Status: DC | PRN

## 2017-07-12 MED ORDER — FENTANYL CITRATE (PF) 100 MCG/2ML IJ SOLN
INTRAMUSCULAR | Status: AC
  Filled 2017-07-12: qty 2

## 2017-07-12 MED ORDER — OXYCODONE HCL 5 MG PO TABS
15.0000 mg | ORAL_TABLET | ORAL | Status: DC
  Administered 2017-07-12: 15 mg via ORAL

## 2017-07-12 MED ORDER — KETOROLAC TROMETHAMINE 30 MG/ML IJ SOLN
30.0000 mg | Freq: Four times a day (QID) | INTRAMUSCULAR | Status: DC
  Administered 2017-07-12: 30 mg via INTRAVENOUS

## 2017-07-12 MED ORDER — PHENOL 1.4 % MT LIQD: 1.0000 | OROMUCOSAL | Status: DC | PRN

## 2017-07-12 MED ORDER — METOCLOPRAMIDE HCL 10 MG PO TABS: 5.0000 mg | ORAL_TABLET | Freq: Three times a day (TID) | ORAL | Status: DC | PRN

## 2017-07-12 MED ORDER — ROCURONIUM 10MG/ML (10ML) SYRINGE FOR MEDFUSION PUMP - OPTIME
INTRAVENOUS | Status: DC | PRN
  Administered 2017-07-12: 40 mg via INTRAVENOUS

## 2017-07-12 MED ORDER — POVIDONE-IODINE 10 % EX SWAB: 2.0000 "application " | Freq: Once | CUTANEOUS | Status: DC

## 2017-07-12 MED ORDER — DEXAMETHASONE SODIUM PHOSPHATE 4 MG/ML IJ SOLN
4.0000 mg | INTRAMUSCULAR | Status: AC
  Administered 2017-07-12: 4 mg via INTRAVENOUS
  Filled 2017-07-12: qty 1

## 2017-07-12 MED ORDER — PROPOFOL 10 MG/ML IV BOLUS
INTRAVENOUS | Status: AC
  Filled 2017-07-12: qty 40

## 2017-07-12 MED ORDER — FENTANYL CITRATE (PF) 100 MCG/2ML IJ SOLN
25.0000 ug | INTRAMUSCULAR | Status: DC | PRN
  Administered 2017-07-12: 25 ug via INTRAVENOUS

## 2017-07-12 MED ORDER — LIDOCAINE HCL (CARDIAC) 10 MG/ML IV SOLN
INTRAVENOUS | Status: DC | PRN
  Administered 2017-07-12: 40 mg via INTRAVENOUS

## 2017-07-12 MED ORDER — HYDROMORPHONE HCL 1 MG/ML IJ SOLN
0.2500 mg | INTRAMUSCULAR | Status: DC | PRN
  Administered 2017-07-12 (×4): 0.5 mg via INTRAVENOUS
  Filled 2017-07-12 (×3): qty 1

## 2017-07-12 MED ORDER — ARTIFICIAL TEARS OPHTHALMIC OINT
TOPICAL_OINTMENT | OPHTHALMIC | Status: AC
  Filled 2017-07-12: qty 7

## 2017-07-12 MED ORDER — SODIUM CHLORIDE 0.9 % IV SOLN
INTRAVENOUS | Status: AC
  Administered 2017-07-12: 17:00:00 via INTRAVENOUS

## 2017-07-12 MED ORDER — VENLAFAXINE HCL ER 75 MG PO CP24
300.0000 mg | ORAL_CAPSULE | Freq: Every day | ORAL | Status: DC
  Administered 2017-07-13 – 2017-07-14 (×2): 300 mg via ORAL
  Filled 2017-07-12 (×2): qty 4

## 2017-07-12 MED ORDER — POLYETHYLENE GLYCOL 3350 17 G PO PACK: 17.0000 g | PACK | Freq: Every day | ORAL | Status: DC | PRN

## 2017-07-12 MED ORDER — ACETAMINOPHEN 650 MG RE SUPP: 650.0000 mg | Freq: Four times a day (QID) | RECTAL | Status: DC | PRN

## 2017-07-12 MED ORDER — VANCOMYCIN HCL IN DEXTROSE 1-5 GM/200ML-% IV SOLN
1000.0000 mg | INTRAVENOUS | Status: AC
  Administered 2017-07-12: 1000 mg via INTRAVENOUS
  Filled 2017-07-12: qty 200

## 2017-07-12 MED ORDER — ONDANSETRON HCL 4 MG/2ML IJ SOLN
4.0000 mg | Freq: Once | INTRAMUSCULAR | Status: AC
  Administered 2017-07-12: 4 mg via INTRAVENOUS

## 2017-07-12 MED ORDER — CHLORHEXIDINE GLUCONATE 4 % EX LIQD: 60.0000 mL | Freq: Once | CUTANEOUS | Status: DC

## 2017-07-12 MED ORDER — ASPIRIN EC 81 MG PO TBEC
81.0000 mg | DELAYED_RELEASE_TABLET | Freq: Every day | ORAL | Status: DC
  Administered 2017-07-12 – 2017-07-14 (×3): 81 mg via ORAL
  Filled 2017-07-12 (×4): qty 1

## 2017-07-12 MED ORDER — LIDOCAINE HCL (PF) 1 % IJ SOLN
INTRAMUSCULAR | Status: AC
  Filled 2017-07-12: qty 5

## 2017-07-12 MED ORDER — ONDANSETRON HCL 4 MG PO TABS: 8.0000 mg | ORAL_TABLET | ORAL | Status: DC | PRN

## 2017-07-12 MED ORDER — BUPIVACAINE-EPINEPHRINE (PF) 0.5% -1:200000 IJ SOLN
INTRAMUSCULAR | Status: AC
  Filled 2017-07-12: qty 60

## 2017-07-12 MED ORDER — DOCUSATE SODIUM 100 MG PO CAPS: 200.0000 mg | ORAL_CAPSULE | Freq: Every day | ORAL | Status: DC | PRN

## 2017-07-12 MED ORDER — LACTATED RINGERS IV SOLN
INTRAVENOUS | Status: DC
  Administered 2017-07-12 (×2): via INTRAVENOUS

## 2017-07-12 MED ORDER — HYDROMORPHONE HCL 1 MG/ML IJ SOLN
1.0000 mg | INTRAMUSCULAR | Status: DC | PRN
  Administered 2017-07-12 – 2017-07-13 (×7): 1 mg via INTRAVENOUS
  Filled 2017-07-12 (×7): qty 1

## 2017-07-12 SURGICAL SUPPLY — 66 items
BANDAGE ELASTIC 4 LF NS (GAUZE/BANDAGES/DRESSINGS) ×4 IMPLANT
BANDAGE ESMARK 4X12 BL STRL LF (DISPOSABLE) ×1 IMPLANT
BIT DRILL CANN 2.7 (BIT) ×1
BIT DRILL SRG 2.7XCANN AO CPLG (BIT) ×1 IMPLANT
BIT DRL SRG 2.7XCANN AO CPLNG (BIT) ×1
BLADE SURG SZ10 CARB STEEL (BLADE) ×2 IMPLANT
BNDG COHESIVE 4X5 TAN STRL (GAUZE/BANDAGES/DRESSINGS) ×2 IMPLANT
BNDG ESMARK 4X12 BLUE STRL LF (DISPOSABLE) ×2
CHLORAPREP W/TINT 26ML (MISCELLANEOUS) ×4 IMPLANT
CLOTH BEACON ORANGE TIMEOUT ST (SAFETY) ×2 IMPLANT
COVER LIGHT HANDLE STERIS (MISCELLANEOUS) ×4 IMPLANT
CUFF TOURNIQUET SINGLE 34IN LL (TOURNIQUET CUFF) ×2 IMPLANT
DECANTER SPIKE VIAL GLASS SM (MISCELLANEOUS) ×4 IMPLANT
DRAPE C-ARM FOLDED MOBILE STRL (DRAPES) ×2 IMPLANT
DRAPE HALF SHEET 40X57 (DRAPES) ×2 IMPLANT
DRAPE PROXIMA HALF (DRAPES) ×2 IMPLANT
DRILL 2.6X122MM WL AO SHAFT (BIT) ×2 IMPLANT
GAUZE SPONGE 4X4 12PLY STRL (GAUZE/BANDAGES/DRESSINGS) ×2 IMPLANT
GAUZE XEROFORM 5X9 LF (GAUZE/BANDAGES/DRESSINGS) ×2 IMPLANT
GLOVE BIOGEL PI IND STRL 7.0 (GLOVE) ×3 IMPLANT
GLOVE BIOGEL PI INDICATOR 7.0 (GLOVE) ×3
GLOVE SKINSENSE NS SZ8.0 LF (GLOVE) ×1
GLOVE SKINSENSE STRL SZ8.0 LF (GLOVE) ×1 IMPLANT
GLOVE SS N UNI LF 8.5 STRL (GLOVE) ×2 IMPLANT
GOWN STRL REUS W/TWL LRG LVL3 (GOWN DISPOSABLE) ×4 IMPLANT
GOWN STRL REUS W/TWL XL LVL3 (GOWN DISPOSABLE) ×2 IMPLANT
INST SET MINOR BONE (KITS) ×2 IMPLANT
K-WIRE 1.4X100 (WIRE) ×8
K-WIRE 1.6X150 (WIRE) ×2
K-WIRE FX150X1.6XKRSH (WIRE) ×2
KIT ROOM TURNOVER APOR (KITS) ×2 IMPLANT
KWIRE 1.4X100 (WIRE) ×4 IMPLANT
KWIRE FX150X1.6XKRSH (WIRE) ×2 IMPLANT
MANIFOLD NEPTUNE II (INSTRUMENTS) ×2 IMPLANT
NEEDLE HYPO 21X1.5 SAFETY (NEEDLE) ×2 IMPLANT
NS IRRIG 1000ML POUR BTL (IV SOLUTION) ×2 IMPLANT
PACK BASIC LIMB (CUSTOM PROCEDURE TRAY) ×2 IMPLANT
PAD ABD 5X9 TENDERSORB (GAUZE/BANDAGES/DRESSINGS) ×4 IMPLANT
PAD ARMBOARD 7.5X6 YLW CONV (MISCELLANEOUS) ×2 IMPLANT
PAD CAST 4YDX4 CTTN HI CHSV (CAST SUPPLIES) ×1 IMPLANT
PADDING CAST COTTON 4X4 STRL (CAST SUPPLIES) ×1
PLATE 6H 113MM (Plate) ×2 IMPLANT
SCREW 3.5X10MM (Screw) ×4 IMPLANT
SCREW 42X4.0MM (Screw) ×2 IMPLANT
SCREW 55X4.0MM (Screw) ×4 IMPLANT
SCREW BONE 14MMX3.5MM (Screw) ×4 IMPLANT
SCREW BONE 3.5X30MM (Screw) ×2 IMPLANT
SCREW BONE 3.5X32MM (Screw) ×2 IMPLANT
SCREW BONE 3.5X34 (Screw) ×2 IMPLANT
SCREW BONE NON-LCKING 3.5X12MM (Screw) ×4 IMPLANT
SCREW LOCK 3.5X14 (Screw) ×8 IMPLANT
SCREW LOCKING 3.5X12 (Screw) ×2 IMPLANT
SCREW LOCKING 3.5X18MM (Screw) ×2 IMPLANT
SCREW PART THR 4.0X38MM (Screw) ×2 IMPLANT
SET BASIN LINEN APH (SET/KITS/TRAYS/PACK) ×2 IMPLANT
SPLINT J IMMOBILIZER 4X20FT (CAST SUPPLIES) IMPLANT
SPLINT J PLASTER J 4INX20Y (CAST SUPPLIES)
SPONGE LAP 18X18 X RAY DECT (DISPOSABLE) ×2 IMPLANT
STAPLER VISISTAT 35W (STAPLE) ×2 IMPLANT
SUT MON AB 0 CT1 (SUTURE) ×2 IMPLANT
SUT MON AB 2-0 CT1 36 (SUTURE) IMPLANT
SUT VIC AB 1 CT1 27 (SUTURE) ×1
SUT VIC AB 1 CT1 27XBRD ANTBC (SUTURE) ×1 IMPLANT
SYR 30ML LL (SYRINGE) ×2 IMPLANT
SYR BULB IRRIGATION 50ML (SYRINGE) ×2 IMPLANT
kwire ×2 IMPLANT

## 2017-07-12 NOTE — Anesthesia Postprocedure Evaluation (Signed)
Anesthesia Post Note  Patient: [name] [name]  Procedure(s) Performed: OPEN REDUCTION INTERNAL FIXATION (ORIF) LEFT ANKLE FRACTURE (Left )  Patient location during evaluation: PACU Anesthesia Type: General Level of consciousness: awake and alert and oriented Pain management: pain level not controlled Vital Signs Assessment: post-procedure vital signs reviewed and stable Respiratory status: spontaneous breathing Cardiovascular status: blood pressure returned to baseline and stable Anesthetic complications: no Comments: Difficult controlling pain, continues to c/o high pain and intermittently dozing off.  Being admitted for pain management through the night.     Last Vitals:  Vitals:   07/12/17 1415 07/12/17 1442  BP: (!) 143/89   Pulse: (!) 116   Resp: 16   Temp:    SpO2:  95%    Last Pain:  Vitals:   07/12/17 1442  TempSrc:   PainSc: 8                  Shayden Gingrich

## 2017-07-12 NOTE — Brief Op Note (Signed)
07/12/2017  1:21 PM  [name]  61 y.o. female  PRE-OPERATIVE DIAGNOSIS:  Displaced Trimalleolar unstable fracture left ankle  POST-OPERATIVE DIAGNOSIS:  Displaced Trimalleolar unstable fracture left ankle  PROCEDURE:  Procedure(s): OPEN REDUCTION INTERNAL FIXATION (ORIF) LEFT ANKLE FRACTURE (Left)   Ankle solutions implants Lateral plate and screws Medial cannulated screw and 0.062 K wire 1 cannulated screw anterior to posterior and 1 fully threaded screw anterior to poster  SURGEON:  Surgeon(s) and Role:    * Carole Civil, MD - Primary  PHYSICIAN ASSISTANT:   ASSISTANTS: betty ashley    ANESTHESIA:   general  EBL:  40 mL   BLOOD ADMINISTERED:none  DRAINS: none   LOCAL MEDICATIONS USED:  MARCAINE     SPECIMEN:  No Specimen  DISPOSITION OF SPECIMEN:  N/A  COUNTS:  YES  TOURNIQUET:  152min/300mm hg  DICTATION: .Dragon Dictation   The patient was seen in the preop area. I checked her skin and it was wrinkling well blisters were all gone skin and real epithelialized. I reviewed her chart and dated the update and took her to surgery. She has several allergies we gave her vancomycin for IV antibiotics. She had general anesthesia. She was in the supine position with a 5 pound sandbag under the left hip.  After sterile prep and drape with Betadine timeout was completed. Implants were available x-rays were visualized.  We exsanguinated the limb with [place] 4 inch Esmarch elevated the tourniquet to 300 mm.  I made an incision over the fibula on the posterior edge divided the tissue down to bone performed a subperiosteal dissection and identified the fracture which was partially healed. This had to be taken down as it was shortened and externally rotated. I cleaned out the fracture regain length and held with a bone clamp. I took an x-ray to confirm length and mortise reduction. Once I was satisfied with that I placed a one third tubular cluster plate with  multiple screws first starting at the oblique end of the fracture placing the first screw nonlocking. And then coming back to the pointed or acute angle portion of the fracture placing a second nonlocking screw. Then I did a combination of locking and nonlocking screws depending on how soft the bone was.  X-rays confirmed mortise reduction.  I then turned my attention to the medial side where I made a posterior skin incision brought it around the medial malleolus. I opened the fracture which was comminuted irrigated the joint reduced it with a clamp and took an x-ray. Once the reduction was satisfactory I drilled a threaded guidewire into the malleolus confirmed its position and then measured drilled and placed a cannulated partially threaded screw. I attempted a second parallel screw but the bone was too comminuted I took that screw out placed a K wire Bennett buried in the bone  This reduced both of these fractures and then I turned my attention to the posterior malleolar fragment  First I used blunt dissection to gain access to the posterior part of the joint I placed a bone reduction clamp on the fracture and took a lateral x-ray to confirm reduction and then I placed 2 screws front to back 1 partially-threaded cannulated in one fully threaded. X-rays confirmed its position  Final x-rays confirmed that the mortise was reduced all fractures were reduced hardware was in good position  I then irrigated all the wounds out with copious amounts of saline.  I closed the medial wound with 2-0  Monocryl and staples the anterior room with 2-0 Monocryl and staples the lateral wound with #1 Vicryl and staples.  I injected 60 mL of Marcaine dividing it between the 3 wounds  I placed a sugar tong splint with the foot in neutral position after sterile dressings were applied  I let the tourniquet down before the splint was placed to allow for swelling  The patient was extubated and taken to the recovery  room in stable condition  She will be placed in observation for pain control as her pain medication requirements are quite high because she has a disc problem in her back and is on chronic oxycodone 20 mg every 4 hours with a pain contract.   PLAN OF CARE: Admit for overnight observation  PATIENT DISPOSITION:  PACU - hemodynamically stable.   Delay start of Pharmacological VTE agent (>24hrs) due to surgical blood loss or risk of bleeding: not applicable  [id number]

## 2017-07-12 NOTE — Interval H&P Note (Signed)
History and Physical Interval Note:  07/12/2017 10:39 AM  [name] [name]  has presented today for surgery, with the diagnosis of Displaced Trimalleolar unstable fracture left ankle  The various methods of treatment have been discussed with the patient and family. After consideration of risks, benefits and other options for treatment, the patient has consented to  Procedure(s): OPEN REDUCTION INTERNAL FIXATION (ORIF) LEFT ANKLE FRACTURE (Left) as a surgical intervention .  The patient's history has been reviewed, patient examined, no change in status, stable for surgery.  I have reviewed the patient's chart and labs.  Questions were answered to the patient's satisfaction.    Skin rechecked: skin has good wrinkling and there are no blisters   Arther Abbott

## 2017-07-12 NOTE — Anesthesia Preprocedure Evaluation (Signed)
Anesthesia Evaluation  Patient identified by MRN, date of birth, ID band Patient awake    Reviewed: Allergy & Precautions, H&P , NPO status , Patient's Chart, lab work & pertinent test results  History of Anesthesia Complications (+) PONV, AWARENESS UNDER ANESTHESIA and history of anesthetic complications  Airway Mallampati: II  TM Distance: >3 FB   Mouth opening: Limited Mouth Opening  Dental  (+) Teeth Intact, Dental Advisory Given, Chipped,    Pulmonary neg pulmonary ROS,    Pulmonary exam normal breath sounds clear to auscultation       Cardiovascular negative cardio ROS Normal cardiovascular exam Rhythm:Regular Rate:Normal     Neuro/Psych PSYCHIATRIC DISORDERS Depression Chronic pain  Neuromuscular disease negative neurological ROS     GI/Hepatic negative GI ROS, Neg liver ROS, GERD  Medicated,  Endo/Other  negative endocrine ROS  Renal/GU negative Renal ROS  negative genitourinary   Musculoskeletal  (+) Fibromyalgia -  Abdominal   Peds  Hematology negative hematology ROS (+)   Anesthesia Other Findings  Hx of intraoperative awareness during colonoscopy and upper endoscopy as well as D&C.  Spinal cord stimulator, hx chronic pain.  Reproductive/Obstetrics                             Anesthesia Physical Anesthesia Plan  ASA: II  Anesthesia Plan: General   Post-op Pain Management:    Induction: Intravenous, Rapid sequence and Cricoid pressure planned  PONV Risk Score and Plan:   Airway Management Planned: Oral ETT  Additional Equipment:   Intra-op Plan:   Post-operative Plan: Extubation in OR  Informed Consent: I have reviewed the patients History and Physical, chart, labs and discussed the procedure including the risks, benefits and alternatives for the proposed anesthesia with the patient or authorized representative who has indicated his/her understanding and  acceptance.     Plan Discussed with:   Anesthesia Plan Comments: (Have #6 cuffed ETT available.)        Anesthesia Quick Evaluation

## 2017-07-12 NOTE — Progress Notes (Signed)
Attempted report X2 

## 2017-07-12 NOTE — Anesthesia Procedure Notes (Addendum)
Procedure Name: Intubation Date/Time: 07/12/2017 10:54 AM Performed by: Tressie Stalker E Pre-anesthesia Checklist: Patient identified, Patient being monitored, Timeout performed, Emergency Drugs available and Suction available Patient Re-evaluated:Patient Re-evaluated prior to induction Oxygen Delivery Method: Circle system utilized Preoxygenation: Pre-oxygenation with 100% oxygen Induction Type: IV induction and Cricoid Pressure applied Ventilation: Mask ventilation without difficulty Laryngoscope Size: Mac and 3 Grade View: Grade I Tube type: Oral Tube size: 7.0 mm Number of attempts: 1 Airway Equipment and Method: Stylet Placement Confirmation: ETT inserted through vocal cords under direct vision,  positive ETCO2 and breath sounds checked- equal and bilateral Secured at: 21 cm Tube secured with: Tape Dental Injury: Teeth and Oropharynx as per pre-operative assessment  Comments: 2 looks with a Mac 3, grade 2 view, would suggest a Miller 2 or glidescope.  Just a little difficulty passing the 7ETT, and mildly anterior.

## 2017-07-12 NOTE — Telephone Encounter (Signed)
Oxycodone 30mg  has been approved for patient.

## 2017-07-12 NOTE — Discharge Instructions (Signed)
Ankle Fracture A fracture is a break in a bone. A cast or splint may be used to protect the ankle and heal the break. Sometimes, surgery is needed. Follow these instructions at home:  Use crutches as told by your doctor. It is very important that you use your crutches correctly.  Do not put weight or pressure on the injured ankle until told by your doctor.  Keep your ankle raised (elevated) when sitting or lying down.  Apply ice to the ankle: ? Put ice in a plastic bag. ? Place a towel between your cast and the bag. ? Leave the ice on for 20 minutes, 2-3 times a day.  If you have a plaster or fiberglass cast: ? Do not try to scratch under the cast with any objects. ? Check the skin around the cast every day. You may put lotion on red or sore areas. ? Keep your cast dry and clean.  If you have a plaster splint: ? Wear the splint as told by your doctor. ? You can loosen the elastic around the splint if your toes get numb, tingle, or turn cold or blue.  Do not put pressure on any part of your cast or splint. It may break. Rest your plaster splint or cast only on a pillow the first 24 hours until it is fully hardened.  Cover your cast or splint with a plastic bag during showers.  Do not lower your cast or splint into water.  Take medicine as told by your doctor.  Do not drive until your doctor says it is safe.  Follow-up with your doctor as told. It is very important that you go to your follow-up visits. Contact a doctor if: The swelling and discomfort gets worse. Get help right away if:  Your splint or cast breaks.  You continue to have very bad pain.  You have new pain or swelling after your splint or cast was put on.  Your skin or toes below the injured ankle: ? Turn blue or gray. ? Feel cold, numb, or you cannot feel them.  There is a bad smell or yellowish white fluid (pus) coming from under the splint or cast. This information is not intended to replace advice  given to you by your health care provider. Make sure you discuss any questions you have with your health care provider. Document Released: 07/09/2009 Document Revised: 02/17/2016 Document Reviewed: 04/10/2013 Elsevier Interactive Patient Education  2017 [employer]  

## 2017-07-12 NOTE — Transfer of Care (Signed)
Immediate Anesthesia Transfer of Care Note  Patient: [name] [name]  Procedure(s) Performed: OPEN REDUCTION INTERNAL FIXATION (ORIF) LEFT ANKLE FRACTURE (Left )  Patient Location: PACU  Anesthesia Type:General  Level of Consciousness: awake and alert   Airway & Oxygen Therapy: Patient Spontanous Breathing and Patient connected to nasal cannula oxygen  Post-op Assessment: Report given to RN and Post -op Vital signs reviewed and stable  Post vital signs: Reviewed and stable  Last Vitals:  Vitals:   07/12/17 1035 07/12/17 1040  BP: 137/77 140/88  Pulse:    Resp: (!) 21 (!) 22  Temp:    SpO2: 97% 99%    Last Pain:  Vitals:   07/12/17 0858  TempSrc: Oral         Complications: No apparent anesthesia complications

## 2017-07-12 NOTE — Op Note (Signed)
07/12/2017  1:21 PM  [name]  61 y.o. female  PRE-OPERATIVE DIAGNOSIS:  Displaced Trimalleolar unstable fracture left ankle  POST-OPERATIVE DIAGNOSIS:  Displaced Trimalleolar unstable fracture left ankle  PROCEDURE:  Procedure(s): OPEN REDUCTION INTERNAL FIXATION (ORIF) LEFT ANKLE FRACTURE (Left)   Ankle solutions implants Lateral plate and screws Medial cannulated screw and 0.062 K wire 1 cannulated screw anterior to posterior and 1 fully threaded screw anterior to poster  SURGEON:  Surgeon(s) and Role:    * Carole Civil, MD - Primary  PHYSICIAN ASSISTANT:   ASSISTANTS: betty ashley    ANESTHESIA:   general  EBL:  40 mL   BLOOD ADMINISTERED:none  DRAINS: none   LOCAL MEDICATIONS USED:  MARCAINE     SPECIMEN:  No Specimen  DISPOSITION OF SPECIMEN:  N/A  COUNTS:  YES  TOURNIQUET:  139min/300mm hg  DICTATION: .Dragon Dictation   The patient was seen in the preop area. I checked her skin and it was wrinkling well blisters were all gone skin and real epithelialized. I reviewed her chart and dated the update and took her to surgery. She has several allergies we gave her vancomycin for IV antibiotics. She had general anesthesia. She was in the supine position with a 5 pound sandbag under the left hip.  After sterile prep and drape with Betadine timeout was completed. Implants were available x-rays were visualized.  We exsanguinated the limb with [place] 4 inch Esmarch elevated the tourniquet to 300 mm.  I made an incision over the fibula on the posterior edge divided the tissue down to bone performed a subperiosteal dissection and identified the fracture which was partially healed. This had to be taken down as it was shortened and externally rotated. I cleaned out the fracture regain length and held with a bone clamp. I took an x-ray to confirm length and mortise reduction. Once I was satisfied with that I placed a one third tubular cluster plate with  multiple screws first starting at the oblique end of the fracture placing the first screw nonlocking. And then coming back to the pointed or acute angle portion of the fracture placing a second nonlocking screw. Then I did a combination of locking and nonlocking screws depending on how soft the bone was.  X-rays confirmed mortise reduction.  I then turned my attention to the medial side where I made a posterior skin incision brought it around the medial malleolus. I opened the fracture which was comminuted irrigated the joint reduced it with a clamp and took an x-ray. Once the reduction was satisfactory I drilled a threaded guidewire into the malleolus confirmed its position and then measured drilled and placed a cannulated partially threaded screw. I attempted a second parallel screw but the bone was too comminuted I took that screw out placed a K wire Bennett buried in the bone  This reduced both of these fractures and then I turned my attention to the posterior malleolar fragment  First I used blunt dissection to gain access to the posterior part of the joint I placed a bone reduction clamp on the fracture and took a lateral x-ray to confirm reduction and then I placed 2 screws front to back 1 partially-threaded cannulated in one fully threaded. X-rays confirmed its position  Final x-rays confirmed that the mortise was reduced all fractures were reduced hardware was in good position  I then irrigated all the wounds out with copious amounts of saline.  I closed the medial wound with 2-0  Monocryl and staples the anterior room with 2-0 Monocryl and staples the lateral wound with #1 Vicryl and staples.  I injected 60 mL of Marcaine dividing it between the 3 wounds  I placed a sugar tong splint with the foot in neutral position after sterile dressings were applied  I let the tourniquet down before the splint was placed to allow for swelling  The patient was extubated and taken to the recovery  room in stable condition  She will be placed in observation for pain control as her pain medication requirements are quite high because she has a disc problem in her back and is on chronic oxycodone 20 mg every 4 hours with a pain contract.   PLAN OF CARE: Admit for overnight observation  PATIENT DISPOSITION:  PACU - hemodynamically stable.   Delay start of Pharmacological VTE agent (>24hrs) due to surgical blood loss or risk of bleeding: not applicable  [id number]

## 2017-07-13 ENCOUNTER — Encounter (HOSPITAL_COMMUNITY): Payer: Self-pay | Admitting: Orthopedic Surgery

## 2017-07-13 DIAGNOSIS — S82852A Displaced trimalleolar fracture of left lower leg, initial encounter for closed fracture: Secondary | ICD-10-CM | POA: Diagnosis not present

## 2017-07-13 MED ORDER — OXYCODONE HCL 5 MG PO TABS
40.0000 mg | ORAL_TABLET | ORAL | Status: DC | PRN
  Administered 2017-07-13 – 2017-07-14 (×6): 40 mg via ORAL
  Filled 2017-07-13 (×6): qty 8

## 2017-07-13 MED ORDER — OXYCODONE HCL 30 MG PO TABS: 30.0000 mg | ORAL_TABLET | ORAL | 0 refills | Status: DC | PRN

## 2017-07-13 NOTE — Care Management Note (Signed)
Case Management Note  Patient Details  Name: [name] [name] MRN: [id number] Date of Birth: April 21, 1956  Subjective/Objective:                 S/p repair of trimalleolar fracture. Pt is from home, ind at baseline she has been using a WC for mobility, does not feel comfortable operating RW. She has worked with PT who recommends both RW and WC. Pt has wheelchair that was given to her, very deconditioned and does not have all of the working parts, also too large to use in home. Pt requests light weight WC be ordered as medicaid will not pay for both. Pt has no preference of DME provider and is agreeable to [facility]. Pt communicates no other needs or concerns about DC.    Action/Plan: DC home today. DME referral given to [facility] with [facility]. WC will be delivered to pt room prior to DC.   Expected Discharge Date:  07/13/17               Expected Discharge Plan:  Home/Self Care  In-House Referral:  NA  Discharge planning Services  CM Consult  Post Acute Care Choice:  Durable Medical Equipment Choice offered to:  Patient  DME Arranged:  Lightweight manual wheelchair with seat cushion DME Agency:  [place].  Status of Service:  Completed, signed off  Sherald Barge, RN 07/13/2017, 10:05 AM

## 2017-07-13 NOTE — Progress Notes (Signed)
OT Cancellation Note  Patient Details Name: [name] [name] MRN: [id number] DOB: 05-08-1956   Cancelled Treatment:    Reason Eval/Treat Not Completed: OT screened, no needs identified, will sign off. Chart reviewed, pt screened for OT needs. Pt currently limited in ADL completion due to NWB status. Pt will be receiving a light weight manual wheelchair prior to discharge today which will improve independence with ADL performance. Pt will have assistance in the home as needed for ADL completion. No further OT needs at this time.    Guadelupe Sabin, OTR/L  [phone] 07/13/2017, 10:20 AM

## 2017-07-13 NOTE — Care Management (Signed)
    Durable Medical Equipment        Start     Ordered   07/13/17 [phone]  For home use only DME lightweight manual wheelchair with seat cushion  Once    Comments:  Patient suffers from trimalleolar fracture which impairs their ability to perform daily activities like ambulating in the home.  A walker will not resolve  issue with performing activities of daily living. A wheelchair will allow patient to safely perform daily activities. Patient is not able to propel themselves in the home using a standard weight wheelchair due to weakness. Patient can self propel in the lightweight wheelchair.  Accessories: elevating leg rests (ELRs), wheel locks, extensions and anti-tippers.   07/13/17 1224

## 2017-07-13 NOTE — Addendum Note (Signed)
Addendum  created 07/13/17 1453 by Ollen Bowl, CRNA   Sign clinical note

## 2017-07-13 NOTE — Evaluation (Signed)
Physical Therapy Evaluation Patient Details Name: [name] [name] MRN: [id number] DOB: June 26, 1956 Today's Date: 07/13/2017   History of Present Illness  61 year old female fell on September 25 at her home. She has chronic pain syndrome, she has an unknown or nonfunctioning spinal stimulator. She is on oxycodone 15 mg every 6 hours when necessary for pain and sees a pain management clinic specialist    Clinical Impression  Patient demonstrates good return for maintaining NWB LLE during transfers/ttaking steps with RW and limited mostly due to left ankle pain - RN notified for pain medication.  Patient will benefit from use of RW for short distanced household gait.  Patient to be discharged to home today and discharged from physical therapy to nursing for out of bed as tolerated for length of stay in hospital.  Patient suffers from left ankle fracture which impairs her ability to perform daily activities like walking, self care, and routine ADLs in the home.  A walker alone will not resolve the issues with performing activities of daily living. A wheelchair will allow patient to safely perform daily activities that require longer distances to access like walking to dining room and going to appointments in community.  The patient can self propel in the home or has a caregiver who can provide assistance.   Patient will benefit from light weight wheelchair, preferably 16 inches wide or whatever will fit her appropriately that she can easily manage/propel self in her house.      Follow Up Recommendations Home health PT;Supervision for mobility/OOB    Equipment Recommendations  Wheelchair (measurements PT);Wheelchair cushion (measurements PT);Rolling walker with 5" wheels    Recommendations for Other Services       Precautions / Restrictions Precautions Precautions: Fall Restrictions Weight Bearing Restrictions: No      Mobility  Bed Mobility Overal bed mobility: Modified Independent                 Transfers Overall transfer level: Modified independent Equipment used: Rolling walker (2 wheeled)             General transfer comment: good return for using RW or side rail of bed and armrest of chair for stand pivot transfers   Ambulation/Gait Ambulation/Gait assistance: Supervision Ambulation Distance (Feet): 5 Feet Assistive device: Rolling walker (2 wheeled) Gait Pattern/deviations: Step-to pattern Gait velocity: slow Gait velocity interpretation: Below normal speed for age/gender General Gait Details: Patient demonstrates good return for using RW while maintaining NWB LLE, but limited due to increasing pain left ankle.  Stairs            Wheelchair Mobility    Modified Rankin (Stroke Patients Only)       Balance Overall balance assessment: Needs assistance Sitting-balance support: Feet supported;No upper extremity supported Sitting balance-Leahy Scale: Good     Standing balance support: Bilateral upper extremity supported;During functional activity Standing balance-Leahy Scale: Fair                               Pertinent Vitals/Pain Pain Assessment: 0-10 Pain Score: 7  Pain Location: left ankle Pain Descriptors / Indicators: Aching;Sharp Pain Intervention(s): Limited activity within patient's tolerance;Monitored during session;Patient requesting pain meds-RN notified    Home Living Family/patient expects to be discharged to:: Private residence Living Arrangements: Non-relatives/Friends Available Help at Discharge: Friend(s) (boy friend) Type of Home: House Home Access: [place]: One [place]: [place] - standard;Bedside commode;Shower  seat;Wheelchair - manual (wheelchair to big to fit through doors per patient, present walker has no wheels and hard to use)      Prior Function Level of Independence: Independent with assistive device(s)         Comments: since ankle fracture on  06/19/17 has been using wheelchair mostly and pick up walker for short distanced household gait     Hand Dominance        Extremity/Trunk Assessment   Upper Extremity Assessment Upper Extremity Assessment: Overall WFL for tasks assessed    Lower Extremity Assessment Lower Extremity Assessment: Overall WFL for tasks assessed;LLE deficits/detail LLE Deficits / Details: grossly -3/5 except left ankle/foot not tested due to surgery (in splint)    Cervical / Trunk Assessment Cervical / Trunk Assessment: Normal  Communication   Communication: No difficulties  Cognition Arousal/Alertness: Awake/alert Behavior During Therapy: WFL for tasks assessed/performed Overall Cognitive Status: Within Functional Limits for tasks assessed                                        General Comments      Exercises     Assessment/Plan    PT Assessment All further PT needs can be met in the next venue of care  PT Problem List Decreased range of motion;Decreased activity tolerance;Decreased balance;Decreased mobility;Decreased strength       PT Treatment Interventions      PT Goals (Current goals can be found in the Care Plan section)  Acute Rehab PT Goals Patient Stated Goal: return home  PT Goal Formulation: With patient Time For Goal Achievement: 07/13/17 Potential to Achieve Goals: Good    Frequency     Barriers to discharge        Co-evaluation               AM-PAC PT "6 Clicks" Daily Activity  Outcome Measure Difficulty turning over in bed (including adjusting bedclothes, sheets and blankets)?: None Difficulty moving from lying on back to sitting on the side of the bed? : None Difficulty sitting down on and standing up from a chair with arms (e.g., wheelchair, bedside commode, etc,.)?: None Help needed moving to and from a bed to chair (including a wheelchair)?: None Help needed walking in hospital room?: A Little Help needed climbing 3-5 steps with a  railing? : A Lot 6 Click Score: 21    End of Session   Activity Tolerance: Patient limited by pain Patient left: in bed;with call bell/phone within reach Nurse Communication: Mobility status PT Visit Diagnosis: Unsteadiness on feet (R26.81);Other abnormalities of gait and mobility (R26.89);Muscle weakness (generalized) (M62.81)    Time: 1572-6203 PT Time Calculation (min) (ACUTE ONLY): 29 min   Charges:   PT Evaluation $PT Eval Low Complexity: 1 Low PT Treatments $Therapeutic Activity: 23-37 mins   PT G Codes:   PT G-Codes **NOT FOR INPATIENT CLASS** Functional Assessment Tool Used: AM-PAC 6 Clicks Basic Mobility Functional Limitation: Mobility: Walking and moving around Mobility: Walking and Moving Around Current Status (T5974): At least 20 percent but less than 40 percent impaired, limited or restricted Mobility: Walking and Moving Around Goal Status [phone]): At least 20 percent but less than 40 percent impaired, limited or restricted Mobility: Walking and Moving Around Discharge Status [phone]): At least 20 percent but less than 40 percent impaired, limited or restricted    9:43 AM, 07/13/17 Lonell Grandchild, MPT Physical Therapist  with [facility] 336 [phone] office [phone] mobile phone

## 2017-07-13 NOTE — Discharge Summary (Addendum)
Physician Discharge Summary  Patient ID: [name] [name] MRN: [id number] DOB/AGE: 61-Sep-1957 61 y.o.  Admit date: 07/12/2017 Discharge date: 07/14/2017  Admission Diagnoses:Trimalleolar ankle fracture left  Discharge Diagnoses: Same Active Problems:   Closed trimalleolar fracture of left ankle   Trimalleolar fracture of ankle, closed, left, with routine healing, subsequent encounter   Discharged Condition: fair  Hospital Course:   Surgery was done on 07/12/2017. She underwent open treatment internal fixation of trimalleolar fracture with [employer] ankle solutions implants medial lateral anterior posterior  She was admitted for pain control as she has chronic pain she's on a chronic pain management contract.  Discharge Exam: Blood pressure 128/69, pulse 98, temperature 98.4 F (36.9 C), temperature source Oral, resp. rate 15, height 5\' 3"  (1.6 m), weight 160 lb (72.6 kg), SpO2 97 %. I think her pain is controlled as well as we can get it with oxycodone. She has swelling as expected nothing on the other narrowing. She's neurovascularly intact. Good capillary refill noted at the exposed digits in the splint  She said she cant go home so I kept her 1 more day   Today sat 10/20 I removed the splint as she really had pain over the anterior tibia, the splint was tight there I didn't see anything unusual. Her calf was soft as were the other compartments.  I gave her a new pain regiment and discharged her to home   Disposition: 01-Home or Self Care  Discharge Instructions    Call MD / Call 911    Complete by:  As directed    If you experience chest pain or shortness of breath, CALL 911 and be transported to the hospital emergency room.  If you develope a fever above 101 F, pus (white drainage) or increased drainage or redness at the wound, or calf pain, call your surgeon's office.   Constipation Prevention    Complete by:  As directed    Drink plenty of fluids.  Prune juice may be  helpful.  You may use a stool softener, such as Colace (over the counter) 100 mg twice a day.  Use MiraLax (over the counter) for constipation as needed.   Diet - low sodium heart healthy    Complete by:  As directed    Discharge instructions    Complete by:  As directed    Apply no weight on your operative extremity.  Keep the foot elevated when you're sitting or lying down. Apply ice packs for 30 minutes every 2 hours while awake   Increase activity slowly as tolerated    Complete by:  As directed      Allergies as of 07/14/2017      Reactions   Ciprofloxacin Itching, Palpitations   Ampicillin Other (See Comments)   Told by md not to take ampilciian due to penicillin allergy   Atorvastatin Other (See Comments)   Felt like had flu   Buspirone Hcl Other (See Comments)   REACTION: intolerance   Ezetimibe-simvastatin Other (See Comments)   Unknown   Latex Other (See Comments)   Redness and burning   Lyrica [pregabalin] Other (See Comments)   Caused swelling in legs and hands   Penicillins Other (See Comments)   Fever and itching   Sulfa Antibiotics Nausea Only      Medication List    TAKE these medications   acetaminophen 325 MG tablet Commonly known as:  TYLENOL Take 325 mg by mouth every 6 (six) hours as needed for moderate pain or headache.  amphetamine-dextroamphetamine 30 MG tablet Commonly known as:  ADDERALL Take 10 mg by mouth daily as needed (for attention).   aspirin EC 81 MG tablet Take 81 mg by mouth daily.   cyclobenzaprine 10 MG tablet Commonly known as:  FLEXERIL Take 10 mg by mouth 3 (three) times daily as needed for muscle spasms.   diclofenac sodium 1 % Gel Commonly known as:  VOLTAREN Apply 4 g topically 4 (four) times daily.   DULCOLAX 100 MG capsule Generic drug:  docusate sodium Take 200 mg by mouth daily as needed (for constipation).   gabapentin 100 MG capsule Commonly known as:  NEURONTIN Take 1 capsule (100 mg total) by mouth 3  (three) times daily.   ibuprofen 800 MG tablet Commonly known as:  ADVIL,MOTRIN Take 1 tablet (800 mg total) by mouth every 8 (eight) hours as needed. What changed:  medication strength  how much to take  when to take this  reasons to take this   ondansetron 8 MG tablet Commonly known as:  ZOFRAN Take 1 tablet (8 mg total) by mouth every 4 (four) hours as needed for nausea or vomiting.   oxycodone 30 MG immediate release tablet Commonly known as:  ROXICODONE 1 every 3 hours What changed:  how much to take  how to take this  when to take this  reasons to take this  additional instructions   oxyCODONE 20 mg 12 hr tablet Commonly known as:  OXYCONTIN Take 1 tablet (20 mg total) by mouth every 12 (twelve) hours. What changed:  You were already taking a medication with the same name, and this prescription was added. Make sure you understand how and when to take each.   polyethylene glycol powder powder Commonly known as:  GLYCOLAX/MIRALAX Take 17 g by mouth daily as needed for mild constipation or moderate constipation.   promethazine 25 MG suppository Commonly known as:  PHENERGAN UNWRAP AND INSERT ONE SUPPOSITORY RECTALLY EVERY 6 HOURS AS NEEDED FOR NAUSEA.   venlafaxine XR 150 MG 24 hr capsule Commonly known as:  EFFEXOR-XR Take 300 mg by mouth daily with breakfast.   zolpidem 5 MG tablet Commonly known as:  AMBIEN Take 5 mg by mouth at bedtime as needed for sleep.            Durable Medical Equipment        Start     Ordered   07/13/17 [phone]  For home use only DME lightweight manual wheelchair with seat cushion  Once    Comments:  Patient suffers from trimalleolar fracture which impairs their ability to perform daily activities like ambulating in the home.  A walker will not resolve  issue with performing activities of daily living. A wheelchair will allow patient to safely perform daily activities. Patient is not able to propel themselves in the home  using a standard weight wheelchair due to weakness. Patient can self propel in the lightweight wheelchair.  Accessories: elevating leg rests (ELRs), wheel locks, extensions and anti-tippers.   07/13/17 0931       Signed: Arther Abbott 07/13/2017, 7:26 AM

## 2017-07-13 NOTE — Anesthesia Postprocedure Evaluation (Signed)
Anesthesia Post Note  Patient: [name] [name]  Procedure(s) Performed: OPEN REDUCTION INTERNAL FIXATION (ORIF) LEFT ANKLE FRACTURE (Left )  Patient location during evaluation: PACU Anesthesia Type: General Level of consciousness: awake and alert and oriented Pain control: Oain control being managed by Dr. Aline Brochure. Vital Signs Assessment: post-procedure vital signs reviewed and stable Respiratory status: spontaneous breathing Cardiovascular status: blood pressure returned to baseline and stable Postop Assessment: adequate PO intake Anesthetic complications: no     Last Vitals:  Vitals:   07/12/17 2230 07/13/17 0700  BP: 114/74 128/69  Pulse: (!) 104 98  Resp: 17 15  Temp: 36.5 C 36.9 C  SpO2: 97% 97%    Last Pain:  Vitals:   07/13/17 1336  TempSrc:   PainSc: 9                  Morganna Styles

## 2017-07-13 NOTE — Clinical Social Work Note (Signed)
Per CM note patient is going home with HHPT services.    LCSW signing off.    Terryn Rosenkranz, Clydene Pugh, LCSW

## 2017-07-14 DIAGNOSIS — S82852A Displaced trimalleolar fracture of left lower leg, initial encounter for closed fracture: Secondary | ICD-10-CM | POA: Diagnosis not present

## 2017-07-14 MED ORDER — OXYCODONE HCL 30 MG PO TABS: ORAL_TABLET | ORAL | 0 refills | Status: DC

## 2017-07-14 MED ORDER — IBUPROFEN 800 MG PO TABS: 800.0000 mg | ORAL_TABLET | Freq: Three times a day (TID) | ORAL | 1 refills | Status: DC | PRN

## 2017-07-14 MED ORDER — OXYCODONE HCL ER 20 MG PO T12A: 20.0000 mg | EXTENDED_RELEASE_TABLET | Freq: Two times a day (BID) | ORAL | 0 refills | Status: DC

## 2017-07-14 MED ORDER — GABAPENTIN 100 MG PO CAPS: 100.0000 mg | ORAL_CAPSULE | Freq: Three times a day (TID) | ORAL | 2 refills | Status: DC

## 2017-07-14 NOTE — Progress Notes (Signed)
Pt IV removed per NT, tolerated well.  Reviewed discharge instructions with pt and family at bedside.  Answered all questions at this time.

## 2017-07-19 DIAGNOSIS — Z9889 Other specified postprocedural states: Secondary | ICD-10-CM | POA: Insufficient documentation

## 2017-07-19 DIAGNOSIS — Z8781 Personal history of (healed) traumatic fracture: Secondary | ICD-10-CM | POA: Insufficient documentation

## 2017-07-20 ENCOUNTER — Ambulatory Visit (INDEPENDENT_AMBULATORY_CARE_PROVIDER_SITE_OTHER): Payer: Medicaid Other | Admitting: Orthopedic Surgery

## 2017-07-20 VITALS — BP 130/90 | HR 100 | Ht 63.0 in | Wt 160.0 lb

## 2017-07-20 DIAGNOSIS — Z967 Presence of other bone and tendon implants: Secondary | ICD-10-CM

## 2017-07-20 DIAGNOSIS — S82852D Displaced trimalleolar fracture of left lower leg, subsequent encounter for closed fracture with routine healing: Secondary | ICD-10-CM | POA: Diagnosis not present

## 2017-07-20 DIAGNOSIS — Z8781 Personal history of (healed) traumatic fracture: Secondary | ICD-10-CM

## 2017-07-20 DIAGNOSIS — Z9889 Other specified postprocedural states: Secondary | ICD-10-CM

## 2017-07-20 MED ORDER — OXYCODONE HCL ER 20 MG PO T12A: 20.0000 mg | EXTENDED_RELEASE_TABLET | Freq: Two times a day (BID) | ORAL | 0 refills | Status: DC

## 2017-07-20 MED ORDER — OXYCODONE HCL 30 MG PO TABS: ORAL_TABLET | ORAL | 0 refills | Status: DC

## 2017-07-20 NOTE — Progress Notes (Signed)
[phone]  Chief Complaint  Patient presents with  . Follow-up    Recheck on left ankle fracture, DOS 07-12-17.   The patient is status post open treatment internal fixation trimalleolar fracture. She's here for splint change and wound check on postop day #8  She is on oxycodone long release 12 hour dosing and short release and is doing well in terms of pain relief she would like a different muscle relaxer other than Flexeril  Her wounds actually look very good considering all the blistering that we had prior to surgery and that we had to wait almost 3 weeks to do the surgery  We got her foot plantigrade in a sugar tong splint after redressing her wounds and I refilled her medication  Meds ordered this encounter  Medications  . oxyCODONE (OXYCONTIN) 20 mg 12 hr tablet    Sig: Take 1 tablet (20 mg total) by mouth every 12 (twelve) hours.    Dispense:  14 tablet    Refill:  0  . oxycodone (ROXICODONE) 30 MG immediate release tablet    Sig: 1 every 3 hours    Dispense:  35 tablet    Refill:  0   The dosing is very high secondary to the patient's long-standing oxycodone requirements for ongoing chronic pain. She did require extensive increases in her medication to get her pain under control including a long-acting medication to alleviate the periods of low blood levels of medication and opioid tolerance  She has Narcan at home of explaining this to her and her family member about the dangers of the medication doses that she is on and they are comfortable using the Narcan if needed she also has pill bottles with time release locks to keep her from take any medications and her than allowed.  Follow-up in a week for x-rays out of plaster and then cast or Cam Walker

## 2017-07-27 ENCOUNTER — Ambulatory Visit (INDEPENDENT_AMBULATORY_CARE_PROVIDER_SITE_OTHER): Payer: Medicaid Other

## 2017-07-27 ENCOUNTER — Ambulatory Visit (INDEPENDENT_AMBULATORY_CARE_PROVIDER_SITE_OTHER): Payer: Medicaid Other | Admitting: Orthopedic Surgery

## 2017-07-27 VITALS — BP 130/90 | HR 101 | Ht 63.0 in | Wt 160.0 lb

## 2017-07-27 DIAGNOSIS — S82852D Displaced trimalleolar fracture of left lower leg, subsequent encounter for closed fracture with routine healing: Secondary | ICD-10-CM | POA: Diagnosis not present

## 2017-07-27 DIAGNOSIS — Z8781 Personal history of (healed) traumatic fracture: Secondary | ICD-10-CM | POA: Diagnosis not present

## 2017-07-27 DIAGNOSIS — Z9889 Other specified postprocedural states: Secondary | ICD-10-CM

## 2017-07-27 DIAGNOSIS — Z967 Presence of other bone and tendon implants: Secondary | ICD-10-CM | POA: Diagnosis not present

## 2017-07-27 MED ORDER — OXYCODONE HCL 30 MG PO TABS: ORAL_TABLET | ORAL | 0 refills | Status: DC

## 2017-07-27 MED ORDER — OXYCODONE HCL ER 20 MG PO T12A: 20.0000 mg | EXTENDED_RELEASE_TABLET | Freq: Two times a day (BID) | ORAL | 0 refills | Status: DC

## 2017-07-27 NOTE — Patient Instructions (Signed)
No weight-bearing

## 2017-07-27 NOTE — Progress Notes (Signed)
Chief Complaint  Patient presents with  . Follow-up    Recheck on left ankle fracture, DOS 07-12-17.    pwp is 510  All of her incisions look good. Her x-ray looks good. We placed in a short leg cast with maximum dorsiflexion we could achieve and we refilled her pain medications  Meds ordered this encounter  Medications  . oxyCODONE (OXYCONTIN) 20 mg 12 hr tablet    Sig: Take 1 tablet (20 mg total) by mouth every 12 (twelve) hours.    Dispense:  14 tablet    Refill:  0  . oxycodone (ROXICODONE) 30 MG immediate release tablet    Sig: 1 every 3 hours    Dispense:  35 tablet    Refill:  0   No weightbearing

## 2017-07-31 ENCOUNTER — Telehealth: Payer: Self-pay | Admitting: Radiology

## 2017-07-31 NOTE — Telephone Encounter (Signed)
I have completed a prior authorization request for patient to have Oxycontin / faxed to [facility] today

## 2017-08-08 ENCOUNTER — Other Ambulatory Visit: Payer: Self-pay | Admitting: Orthopedic Surgery

## 2017-08-08 ENCOUNTER — Telehealth: Payer: Self-pay | Admitting: Orthopedic Surgery

## 2017-08-08 DIAGNOSIS — Z9889 Other specified postprocedural states: Secondary | ICD-10-CM

## 2017-08-08 DIAGNOSIS — S82852D Displaced trimalleolar fracture of left lower leg, subsequent encounter for closed fracture with routine healing: Secondary | ICD-10-CM

## 2017-08-08 DIAGNOSIS — Z8781 Personal history of (healed) traumatic fracture: Secondary | ICD-10-CM

## 2017-08-08 MED ORDER — OXYCODONE HCL ER 20 MG PO T12A: 20.0000 mg | EXTENDED_RELEASE_TABLET | Freq: Two times a day (BID) | ORAL | 0 refills | Status: DC

## 2017-08-08 NOTE — Telephone Encounter (Signed)
Oxycodone 20 mg   Take 1 tablet (20 mg total) by mouth every 12 (twelve) hours.  Patient is asking if you could do the 20 mg instead of 30 mg.

## 2017-08-08 NOTE — Telephone Encounter (Signed)
Yes, next time 10 mg!

## 2017-08-08 NOTE — Progress Notes (Signed)
pmp checked

## 2017-08-13 ENCOUNTER — Telehealth: Payer: Self-pay | Admitting: Radiology

## 2017-08-13 NOTE — Telephone Encounter (Signed)
I spoke to pain management today patient has gotten a Rx filled for Oxycodone 15mg  #120 from pain management. She did not pick up the OxyContin 20mg  from Dr Aline Brochure that he prescribed on 08/08/17, it has now been shredded. Pain management has advised they will take over her pain medications from this point forward. Patient agrees to this plan, states she is feeling better.

## 2017-08-24 ENCOUNTER — Ambulatory Visit (INDEPENDENT_AMBULATORY_CARE_PROVIDER_SITE_OTHER): Payer: Medicaid Other

## 2017-08-24 ENCOUNTER — Ambulatory Visit (INDEPENDENT_AMBULATORY_CARE_PROVIDER_SITE_OTHER): Payer: Medicaid Other | Admitting: Orthopedic Surgery

## 2017-08-24 ENCOUNTER — Encounter: Payer: Self-pay | Admitting: Orthopedic Surgery

## 2017-08-24 VITALS — BP 125/85 | HR 105 | Ht 63.0 in | Wt 160.0 lb

## 2017-08-24 DIAGNOSIS — Z8781 Personal history of (healed) traumatic fracture: Secondary | ICD-10-CM

## 2017-08-24 DIAGNOSIS — Z967 Presence of other bone and tendon implants: Secondary | ICD-10-CM

## 2017-08-24 DIAGNOSIS — Z9889 Other specified postprocedural states: Secondary | ICD-10-CM

## 2017-08-24 NOTE — Progress Notes (Signed)
Encounter Diagnosis  Name Primary?  . S/P ORIF (open reduction internal fixation) fracture 07/12/17  Yes    Chief Complaint  Patient presents with  . Post-op Follow-up    left ankle fracture, ORIF 07/12/17   6-week postop trimalleolar ankle fracture  Primary complaint is numbness of the foot it appears to be global.  Her skin looks clean dry and intact there is some scaling her x-ray shows intact lateral medial and anterior posterior fixation with what appears to me to be some impaction of the anterolateral corner of the distal tibia.  The ankle mortise appears to be intact.  The medial malleolus is healing nicely.  As is the lateral malleolus.  She has decreased sensation in all nerves of the foot was  She has hyper sensation medially  I think she has a component of complex regional pain syndrome which goes along with her overall chronic pain condition  Recommend weightbearing in a boot with a walker  Twice a day ankle exercises x-ray in 6 weeks.  We will try to get a better lateral x-ray next time.  Encounter Diagnosis  Name Primary?  . S/P ORIF (open reduction internal fixation) fracture 07/12/17  Yes

## 2017-08-24 NOTE — Patient Instructions (Signed)
Weight bearing in the cam walker   soak foot ice a day with Epson salt warm water and 25-50 ankle exercises as instructed

## 2017-09-25 DIAGNOSIS — M84373A Stress fracture, unspecified ankle, initial encounter for fracture: Secondary | ICD-10-CM | POA: Diagnosis not present

## 2017-10-05 ENCOUNTER — Ambulatory Visit: Payer: Medicaid Other | Admitting: Orthopedic Surgery

## 2017-10-12 ENCOUNTER — Encounter: Payer: Self-pay | Admitting: Orthopedic Surgery

## 2017-10-12 ENCOUNTER — Ambulatory Visit (INDEPENDENT_AMBULATORY_CARE_PROVIDER_SITE_OTHER): Payer: Medicaid Other | Admitting: Orthopedic Surgery

## 2017-10-12 ENCOUNTER — Ambulatory Visit (INDEPENDENT_AMBULATORY_CARE_PROVIDER_SITE_OTHER): Payer: Medicaid Other

## 2017-10-12 VITALS — BP 132/90 | HR 98 | Ht 63.0 in | Wt 160.0 lb

## 2017-10-12 DIAGNOSIS — Z9889 Other specified postprocedural states: Secondary | ICD-10-CM

## 2017-10-12 DIAGNOSIS — Z8781 Personal history of (healed) traumatic fracture: Secondary | ICD-10-CM

## 2017-10-12 DIAGNOSIS — Z967 Presence of other bone and tendon implants: Secondary | ICD-10-CM

## 2017-10-12 NOTE — Patient Instructions (Signed)
Start PT at [facility] outpatient

## 2017-10-12 NOTE — Progress Notes (Signed)
Postop appointment  Chief Complaint  Patient presents with  . Follow-up    Recheck on left ankle DOS 07-12-17.    3 months after open treatment internal fixation left ankle trimalleolar fracture  Patient complains of sensitivity in the foot primarily on the side of the where the great toe and dorsal foot.  Complains of heel pain.  X-rays show the fracture has healed reasonably good alignment.  No hardware complications.  Exam shows decreased sensation over the dorsum of the foot in line with a great and second toe and somewhat over the medial malleolar area  All incisions healed  Ankle motion is 5 degrees dorsiflexion 10 plantarflexion.  Recommend physical therapy twice a week for 6 weeks follow-up 6 weeks for recheck range of motion.

## 2017-10-12 NOTE — Addendum Note (Signed)
Addended byCandice Camp on: 10/12/2017 11:18 AM   Modules accepted: Orders

## 2017-10-23 ENCOUNTER — Telehealth (HOSPITAL_COMMUNITY): Payer: Self-pay

## 2017-10-23 NOTE — Telephone Encounter (Signed)
Pt had been contacted and l/m-pt called today and states MD office called to see why she had not scheduled. Pt states she wants to get apptment asap. NF 10/23/16

## 2017-10-24 ENCOUNTER — Telehealth (HOSPITAL_COMMUNITY): Payer: Self-pay

## 2017-10-24 ENCOUNTER — Ambulatory Visit (HOSPITAL_COMMUNITY): Payer: Medicaid Other

## 2017-10-24 NOTE — Telephone Encounter (Signed)
Called and spoke with pt addressing questions she had regarding what OPPT would entail. Explained benefits of PT and potential treatments that could be performed based off of her presentation during evaluation. Pt stating that she is just in so much pain she is not sure she can take anymore; after hearing PT's explanation of potential treatments, pt agreeable to reschedule her evaluation. Transferred pt's phone call up front to allow front office personnel to schedule patient.   Geraldine Solar PT, DPT

## 2017-10-24 NOTE — Telephone Encounter (Signed)
Patient called to cancel for now and will call back to reschedule when she is feeling better.

## 2017-10-26 DIAGNOSIS — M84373A Stress fracture, unspecified ankle, initial encounter for fracture: Secondary | ICD-10-CM | POA: Diagnosis not present

## 2017-10-31 ENCOUNTER — Ambulatory Visit (HOSPITAL_COMMUNITY): Payer: Medicaid Other | Admitting: Physical Therapy

## 2017-10-31 ENCOUNTER — Telehealth (HOSPITAL_COMMUNITY): Payer: Self-pay | Admitting: Family Medicine

## 2017-10-31 NOTE — Telephone Encounter (Signed)
10/31/17  pt called to cx said she was out of town and we rescheduled for 2/7

## 2017-11-01 ENCOUNTER — Other Ambulatory Visit: Payer: Self-pay

## 2017-11-01 ENCOUNTER — Ambulatory Visit (HOSPITAL_COMMUNITY): Payer: Medicaid Other | Attending: Orthopedic Surgery | Admitting: Physical Therapy

## 2017-11-01 ENCOUNTER — Encounter (HOSPITAL_COMMUNITY): Payer: Self-pay | Admitting: Physical Therapy

## 2017-11-01 DIAGNOSIS — M25672 Stiffness of left ankle, not elsewhere classified: Secondary | ICD-10-CM

## 2017-11-01 DIAGNOSIS — Z8781 Personal history of (healed) traumatic fracture: Secondary | ICD-10-CM | POA: Insufficient documentation

## 2017-11-01 DIAGNOSIS — R2689 Other abnormalities of gait and mobility: Secondary | ICD-10-CM

## 2017-11-01 DIAGNOSIS — Z967 Presence of other bone and tendon implants: Secondary | ICD-10-CM | POA: Insufficient documentation

## 2017-11-01 DIAGNOSIS — R531 Weakness: Secondary | ICD-10-CM | POA: Diagnosis present

## 2017-11-01 DIAGNOSIS — Z9889 Other specified postprocedural states: Secondary | ICD-10-CM

## 2017-11-01 NOTE — Therapy (Addendum)
[place], [place], [address] Phone: [phone]   Fax:  [phone]  Physical Therapy Evaluation  Patient Details  Name: [name] [name] MRN: [id number] Date of Birth: [birth year] Referring Provider: Arther Abbott MD   Encounter Date: 11/01/2017  PT End of Session - 11/01/17 1616    Visit Number  1    Number of Visits  13    Date for PT Re-Evaluation  11/23/17    Authorization Type  Medicaid [place] [facility]    Authorization Time Period  Requesting 3 visits    Authorization - Visit Number  0    Authorization - Number of Visits  3    PT Start Time  1558    PT Stop Time  1647    PT Time Calculation (min)  49 min    Equipment Utilized During Treatment  Other (comment) CAM boot    Activity Tolerance  Patient tolerated treatment well;Patient limited by pain    Behavior During Therapy  [facility] for tasks assessed/performed       Past Medical History:  Diagnosis Date  . ABDOMINAL PAIN, LOWER 02/16/2009  . APPENDECTOMY, HX OF 09/11/2007  . ATTENTION DEFICIT DISORDER, ADULT 02/02/2009  . BACK PAIN 07/16/2007   chronic  . Back pain    spinal stimulator in place  . CERVICAL RADICULOPATHY, RIGHT 07/16/2007  . CHEST PAIN, ACUTE 09/03/2008  . Chronic abdominal pain   . Chronic pain syndrome 09/11/2007  . Complication of anesthesia    woke up during surgeries few times and woke up during endoscopy and colonscopy few weeks ago, in terrible pain  . DDD (degenerative disc disease), lumbosacral   . DEPRESSION/ANXIETY 06/17/2010  . Dysuria 02/20/2008  . Elevated liver enzymes 1 and  1/2 months ago  . Family history of anesthesia complication    mother woke up during surgeries  . Fibromyalgia   . Functional GI symptoms   . FX, RAMUS NOS, CLOSED 07/16/2007  . GERD (gastroesophageal reflux disease)   . H/O failed conscious sedation    PT STATES SHE IS HARD TO SEDATE!  . HEMORRHOIDS 09/11/2007  . HIATAL HERNIA, HX OF 09/11/2007   . History of kidney stones   . HX, PERSONAL, MUSCULOSKELETAL DISORD NEC 07/16/2007  . HX, PERSONAL, URINARY CALCULI 07/16/2007  . Irritable bowel syndrome 09/11/2007  . NEOPLASM, SKIN, UNCERTAIN BEHAVIOR 09/29/4006  . OSTEOARTHRITIS 09/11/2007   oa  . OSTEOPENIA 10/23/2007  . PONV (postoperative nausea and vomiting)   . ROTATOR CUFF REPAIR, HX OF 09/11/2007  . Suicidal ideations   . SYMPTOM, PAIN, ABDOMINAL, RIGHT UP QUADRANT 07/16/2007  . TAH/BSO, HX OF 09/11/2007  . UTI 10/06/2010  . VAGINITIS, ATROPHIC, POSTMENOPAUSAL 07/16/2007    Past Surgical History:  Procedure Laterality Date  . ABDOMINAL HYSTERECTOMY    . APPENDECTOMY    . bone pushed back into  place after fracture Left 25-30 yrs ago   face  . cervical radiculopathy  20 yrs ago   RT  . CHOLECYSTECTOMY N/A 02/18/2014   Procedure: LAPAROSCOPIC CHOLECYSTECTOMY;  Surgeon: Harl Bowie, MD;  Location: [facility];  Service: General;  Laterality: N/A;  . COLONOSCOPY    . DILATION AND CURETTAGE OF UTERUS  age 50  . ESOPHAGOGASTRODUODENOSCOPY    . ORIF ANKLE FRACTURE Left 07/12/2017   Procedure: OPEN REDUCTION INTERNAL FIXATION (ORIF) LEFT ANKLE FRACTURE;  Surgeon: Carole Civil, MD;  Location: [facility];  Service: Orthopedics;  Laterality: Left;  . ROTATOR CUFF  REPAIR     right  . SPINAL CORD STIMULATOR IMPLANT  6-7 yrs ago   lower back, pt turned off 2-3 weeks ago due to causing pain  . TOTAL ABDOMINAL HYSTERECTOMY W/ BILATERAL SALPINGOOPHORECTOMY    . TUBAL LIGATION      There were no vitals filed for this visit.   Subjective Assessment - 11/01/17 1619    Subjective  Patient reported that she was walking outside and she she fell and her ankle was backwards which caused her ankle to break. She reported that her first surgery was canceled, but then in mid October, 2018 she received the surgery. She stated that at first she was walking with a boot and using the walker. Patient reported that she doesn't use the walker  now and that her physician stated she can take the boot off at home sometimes. Patient reported that when ambulating longer distances such as in stores she uses the wheelchair scooter. Patient reported that she is still having a lot of difficulty with her walking and her limp is making it difficult and that it is aggravating her back pain. Patient reported a history of back pain with a spinal stimulator which no longer works. Patient also reported that her left foot feels "numb". 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     Pertinent History  S/p ORIF for trimalleolar fracture of left ankle October 2018    Limitations  Lifting;Standing;Walking;House hold activities    How long can you sit comfortably?  Couple hours    How long can you stand comfortably?  20 minutes    How long can you walk comfortably?  20 minutes    Diagnostic tests  X-ray: Last x-ray inicated stable fixation of Left ankle without hardware complications    Patient Stated Goals  To have less pain     Currently in Pain?  Yes    Pain Score  5     Pain Location  Ankle    Pain Orientation  Left    Pain Descriptors / Indicators  Sharp;Burning    Pain Type  Chronic pain    Pain Radiating Towards  Radiates up to left knee    Pain Onset  More than a month ago    Pain Frequency  Constant    Aggravating Factors   Standing, walking     Pain Relieving Factors  Elevate, medicine    Effect of Pain on Daily Activities  Severely limited    Multiple Pain Sites  Yes    Pain Score  5    Pain Location  Back    Pain Orientation  Lower    Pain Descriptors / Indicators  Pressure    Pain Type  Chronic pain    Pain Radiating Towards  Down the right leg at times    Pain Onset  More than a month ago    Pain Frequency  Constant    Aggravating Factors   Standing, walking     Pain Relieving Factors  Medicine, laying down on heating pad    Effect of Pain on Daily Activities  Moderate  limitation         11/01/17 0001  Assessment  Medical Diagnosis S/p ankle fracture ORIF  Referring Provider Arther Abbott MD  Onset Date/Surgical Date 07/04/17  Next MD Visit 11/28/2017  Prior Therapy For back pain, but not for ankle  Precautions  Required Braces or  Orthoses Other Brace/Splint  Other Brace/Splint CAM Boot  Restrictions  Weight Bearing Restrictions Yes (WBAT in CAM Boot)  Balance Screen  Has the patient fallen in the past 6 months Yes  How many times? 5  Has the patient had a decrease in activity level because of a fear of falling?  Yes  Is the patient reluctant to leave their home because of a fear of falling?  Yes  Prior Function  Vocation Requirements Patient was previously a hairdresser  Clinical biochemist Status Within Functional Limits for tasks assessed  Observation/Other Assessments  Observations Coloring of left foot had a generally deeper purple color to it compared to opposite foot  Skin Integrity The incisions appeared healed  Observation/Other Assessments-Edema   Edema Figure 8  Figure 8 Edema  Figure 8 - Right  20 inches  Figure 8 - Left  21.5 inches  Sensation  Light Touch Impaired by gross assessment  Additional Comments Patient was able to identify where the therapist was touching her foot and legs on all tested locations bilaterally, however, patient reported that on the left side each tested location felt different than the right and that the medial side of the left foot felt very different. Patient also reported a "numbness" on the dorsum of her foot over toes.   AROM  AROM Assessment Site Ankle  Right/Left Ankle Right;Left  Right Ankle Dorsiflexion 2  Right Ankle Plantar Flexion 60  Right Ankle Inversion 39  Right Ankle Eversion 30  Left Ankle Dorsiflexion -3 (Lacking 3 degrees from 0)  Left Ankle Plantar Flexion 15  Left Ankle Inversion 10  Left Ankle Eversion 8  Strength  Overall Strength Comments All muscle testing  of left ankle was shakey  Right/Left Hip Right;Left  Right/Left Knee Right;Left  Right/Left Ankle Right;Left  Right Hip Flexion 4+/5  Right Hip Extension 4+/5  Right Hip ABduction 4/5  Left Hip Flexion 4+/5  Left Hip Extension 4+/5  Left Hip ABduction 4/5  Right Knee Flexion 4+/5  Right Knee Extension 5/5  Left Knee Flexion 4+/5  Left Knee Extension 5/5  Right Ankle Dorsiflexion 5/5  Right Ankle Plantar Flexion 5/5  Right Ankle Inversion 5/5  Right Ankle Eversion 5/5  Left Ankle Dorsiflexion 4/5  Left Ankle Plantar Flexion 2+/5 (Max resistance with gravity minimized with pain)  Left Ankle Inversion 4-/5  Left Ankle Eversion 4-/5  Ambulation/Gait  Ambulation/Gait Yes  Ambulation/Gait Assistance 6: Modified independent (Device/Increase time)  Ambulation Distance (Feet) 452 Feet (3MWT)  Assistive device (None (CAM Boot))  Gait Pattern Decreased step length - right;Decreased stance time - left;Decreased hip/knee flexion - left;Decreased dorsiflexion - left;Left circumduction;Antalgic;Wide base of support  Ambulation Surface Level  Gait velocity 0.76 m/s  Gait Comments Patient ambulated with left lower extremity out to the side during stance phase and antalgic gait with circumduction.  Standardized Balance Assessment  Five times sit to stand comments  Patient performed 5 sit to stands in 14 seconds            Objective measurements completed on examination: See above findings.              PT Education - 11/01/17 2226    Education provided  Yes    Education Details  Patient was educated on results of examination, plan of care, and one exercise for home exercise program.    Person(s) Educated  Patient    Methods  Explanation;Handout    Comprehension  Verbalized understanding  PT Short Term Goals - 11/01/17 2213      PT SHORT TERM GOAL #1   Title  Patient will demonstrate understanding and report regular compliance of HEP.     Time  3    Period   Weeks    Status  New    Target Date  11/22/17      PT SHORT TERM GOAL #2   Title  Patient will demonstrate improvement in active range of motion of 5 degrees in left ankle plantarflexion, dorsiflexion, inversion and eversion in order to improve patient's mechanics and tolerance with gait.    Baseline  Patient demonstrated -3 degrees of dorsiflexion, 15 degrees of plantarflexion, 10 degrees of inversion, and 8 degrees of eversion at evaluation.    Time  3    Period  Weeks    Status  New    Target Date  11/22/17      PT SHORT TERM GOAL #3   Title  Patient will demonstrate ability to ambulate at a velocity of at least 0.8 m/s during 3MWT and ambulating at least 500 feet as evidence of improved gait ambulation.      Baseline  Patient ambulated 452 feet this session at a 0.76 m/s velocity.     Time  3    Period  Weeks    Status  New    Target Date  11/22/17        PT Long Term Goals - 11/01/17 2214      PT LONG TERM GOAL #1   Title  Patient will demonstrate ability to perform 5 times sit to stand in less than 12 seconds as evidence of improvement in ability to perform sit to stands and decreased risk of falls.     Baseline  Patient performed 5 times sit to stand in 14 seconds at evaluation.     Time  6    Period  Weeks    Status  New    Target Date  12/13/17      PT LONG TERM GOAL #2   Title  Patient will demonstrate active dorsiflexion range of motion of the left ankle equal to that of the right ankle, and improvement in left ankle active range of motion in plantarflexion, inversion and eversion of 10 degrees, in order to assist patient with performance of ambulation.      Baseline  Patient demonstrated -3 degrees of dorsiflexion, 15 degrees of plantarflexion, 10 degrees of inversion, and 8 degrees of eversion at evaluation.    Time  6    Period  Weeks    Status  New    Target Date  12/13/17      PT LONG TERM GOAL #3   Title  Patient will demonstrate ability to ambulate at a  velocity of 1.0 m/s with 3 MWT without visible circumduction of left lower extremity and without exacerbation of pain as evidence of improved ambulation.    Baseline  Patient ambulated at a velocity of 0.76 m/s with CAM boot during 3 MWT, with visible circumduction of left lower extremity and reported pain.     Time  6    Period  Weeks    Status  New    Target Date  12/13/17      PT LONG TERM GOAL #4   Title  Patient will report ability to ambulate for 30 minutes in order to assist with ambulation through grocery store without use of motorized wheelchair scooter.     Baseline  Patient reported that she used the motorized wheelchair scooter whenever she goes to the store and cannot walk comfortably for more than 20 minutes.     Time  6    Period  Weeks    Status  New    Target Date  12/13/17      PT LONG TERM GOAL #5   Title  Patient will demonstrate improvement of 1 MMT or 5/5 MMT grade in all deficient planes as evidence of improved strength to assist with functional mobility.     Baseline  Patient is deficient in all planes of strength testing on the left ankle, knee flexion, hip abduction, hip extension, and knee flexion.     Time  6    Period  Weeks    Status  New    Target Date  12/13/17             Plan - 11/01/17 2212    Clinical Impression Statement  Patient is a 62 year old female who presented to physical therapy today s/p ORIF (open reduction internal fixation of trimalleolar fracture of left ankle on 07/04/17. Upon examination patient demonstrated decreased range of motion of left ankle in all planes, as well as decreased strength in all planes. Patient reported diminished sensation primarily on the dorsum of the left foot, and altered sensation through the calf and anterior lower leg, and globally around left foot with particular difficulty alteration in sensation of the medial side of the left foot. With figure 8 testing patient had a 1.5 inch increase in size on the  left foot compared to the right. The patient reported 5 falls in the last 5 months and demonstrated decreased balance with the 5 times sit to stand as she performed 5 sit to stands in 14 seconds. With assessment of ambulation patient ambulated at a decreased velocity of 0.76 m/s on 3MWT. With ambulation, the patient demonstrated circumduction of the left lower extremity, decreased stance time on the left lower extremity, decreased hip, knee, and ankle flexion on the left lower extremity, decreased step length of the right lower extremity, as well as antalgic gait and a limp. Patient would benefit from skilled physical therapy in order to address the abovementioned deficits in strength, range of motion, edema, balance, gait, and improve overall functional mobility.     History and Personal Factors relevant to plan of care:  S/p ORIF left ankle fracture 07/04/18; chronic back pain with spinal stimulator; Fibromyalgia; OA    Clinical Presentation  Stable    Clinical Presentation due to:  MMT, ROM, 3MWT, 5x STS, clinical judgement    Clinical Decision Making  Low    Rehab Potential  Fair    Clinical Impairments Affecting Rehab Potential  Positive: Patient motivation; Negative: comorbidities, severity of pain    PT Frequency  2x / week    PT Duration  6 weeks    PT Treatment/Interventions  ADLs/Self Care Home Management;Cryotherapy;DME Instruction;Gait training;Stair training;Functional mobility training;Therapeutic activities;Therapeutic exercise;Balance training;Neuromuscular re-education;Patient/family education;Orthotic Fit/Training;Manual techniques;Passive range of motion;Compression bandaging;Taping    PT Next Visit Plan  Review evaluation and HEP add 4-way hip exercise to HEP, and gentle AROM exercises for ankle. Focus on improving ROM and decreasing edema initially.     PT Home Exercise Plan  Ankle ABC's for Left ankle 1 x 2 times a day    Consulted and Agree with Plan of Care  Patient        Patient will benefit from skilled therapeutic intervention in  order to improve the following deficits and impairments:  Abnormal gait, Decreased balance, Decreased endurance, Decreased mobility, Difficulty walking, Impaired sensation, Decreased range of motion, Increased edema, Improper body mechanics, Decreased activity tolerance, Decreased strength, Pain  Visit Diagnosis: S/P ORIF (open reduction internal fixation) fracture  Other abnormalities of gait and mobility  Decreased range of motion of left ankle  Decreased strength     Problem List Patient Active Problem List   Diagnosis Date Noted  . S/P ORIF (open reduction internal fixation) fracture 07/12/17  07/19/2017  . Trimalleolar fracture of ankle, closed, left, with routine healing, subsequent encounter 07/12/2017  . Closed trimalleolar fracture of left ankle   . Insomnia 02/21/2012  . Routine health maintenance 07/25/2011  . DEPRESSION/ANXIETY 06/17/2010  . Attention deficit disorder 02/02/2009  . Osteopenia 10/23/2007  . CHRONIC PAIN SYNDROME 09/11/2007  . HEMORRHOIDS 09/11/2007  . IRRITABLE BOWEL SYNDROME 09/11/2007  . OSTEOARTHRITIS 09/11/2007    Clarene Critchley 11/02/2017, 12:36 PM  [place], [place], [address] Phone: [phone]   Fax:  [phone]  Name: [name] [name] MRN: [id number] Date of Birth: [birth year]

## 2017-11-01 NOTE — Patient Instructions (Signed)
  ANKLE ABC's While in a seated position, write out the alphabet in the air with your big toe. Your ankle should be moving as you perform This. Repeat 1 Time Hold 1 Second Complete 1 Set Perform 2 Time(s) a Day

## 2017-11-07 ENCOUNTER — Encounter (HOSPITAL_COMMUNITY): Payer: Self-pay

## 2017-11-07 ENCOUNTER — Ambulatory Visit (HOSPITAL_COMMUNITY): Payer: Medicaid Other

## 2017-11-07 DIAGNOSIS — Z9889 Other specified postprocedural states: Secondary | ICD-10-CM

## 2017-11-07 DIAGNOSIS — Z967 Presence of other bone and tendon implants: Secondary | ICD-10-CM | POA: Diagnosis not present

## 2017-11-07 DIAGNOSIS — Z8781 Personal history of (healed) traumatic fracture: Secondary | ICD-10-CM

## 2017-11-07 DIAGNOSIS — M25672 Stiffness of left ankle, not elsewhere classified: Secondary | ICD-10-CM

## 2017-11-07 DIAGNOSIS — R2689 Other abnormalities of gait and mobility: Secondary | ICD-10-CM

## 2017-11-07 DIAGNOSIS — R531 Weakness: Secondary | ICD-10-CM

## 2017-11-07 NOTE — Therapy (Addendum)
[place], [place], [address] Phone: [phone]   Fax:  [phone]  Physical Therapy Treatment  Patient Details  Name: [name] [name] MRN: [id number] Date of Birth: Jun 30, 1956 Referring Provider: Arther Abbott MD   Encounter Date: 11/07/2017  PT End of Session - 11/07/17 0834    Visit Number  2    Number of Visits  13    Date for PT Re-Evaluation  11/23/17    Authorization Type  Medicaid [place] A    Authorization Time Period  Requesting 3 visits 02/13--11/13/17    Authorization - Visit Number  1    Authorization - Number of Visits  3    PT Start Time  0830 Pt late for apt today    PT Stop Time  0902    PT Time Calculation (min)  32 min    Activity Tolerance  Patient tolerated treatment well;Patient limited by pain    Behavior During Therapy  [facility] for tasks assessed/performed       Past Medical History:  Diagnosis Date  . ABDOMINAL PAIN, LOWER 02/16/2009  . APPENDECTOMY, HX OF 09/11/2007  . ATTENTION DEFICIT DISORDER, ADULT 02/02/2009  . BACK PAIN 07/16/2007   chronic  . Back pain    spinal stimulator in place  . CERVICAL RADICULOPATHY, RIGHT 07/16/2007  . CHEST PAIN, ACUTE 09/03/2008  . Chronic abdominal pain   . Chronic pain syndrome 09/11/2007  . Complication of anesthesia    woke up during surgeries few times and woke up during endoscopy and colonscopy few weeks ago, in terrible pain  . DDD (degenerative disc disease), lumbosacral   . DEPRESSION/ANXIETY 06/17/2010  . Dysuria 02/20/2008  . Elevated liver enzymes 1 and  1/2 months ago  . Family history of anesthesia complication    mother woke up during surgeries  . Fibromyalgia   . Functional GI symptoms   . FX, RAMUS NOS, CLOSED 07/16/2007  . GERD (gastroesophageal reflux disease)   . H/O failed conscious sedation    PT STATES SHE IS HARD TO SEDATE!  . HEMORRHOIDS 09/11/2007  . HIATAL HERNIA, HX OF 09/11/2007  . History of kidney stones    . HX, PERSONAL, MUSCULOSKELETAL DISORD NEC 07/16/2007  . HX, PERSONAL, URINARY CALCULI 07/16/2007  . Irritable bowel syndrome 09/11/2007  . NEOPLASM, SKIN, UNCERTAIN BEHAVIOR 03/25/625  . OSTEOARTHRITIS 09/11/2007   oa  . OSTEOPENIA 10/23/2007  . PONV (postoperative nausea and vomiting)   . ROTATOR CUFF REPAIR, HX OF 09/11/2007  . Suicidal ideations   . SYMPTOM, PAIN, ABDOMINAL, RIGHT UP QUADRANT 07/16/2007  . TAH/BSO, HX OF 09/11/2007  . UTI 10/06/2010  . VAGINITIS, ATROPHIC, POSTMENOPAUSAL 07/16/2007    Past Surgical History:  Procedure Laterality Date  . ABDOMINAL HYSTERECTOMY    . APPENDECTOMY    . bone pushed back into  place after fracture Left 25-30 yrs ago   face  . cervical radiculopathy  20 yrs ago   RT  . CHOLECYSTECTOMY N/A 02/18/2014   Procedure: LAPAROSCOPIC CHOLECYSTECTOMY;  Surgeon: Harl Bowie, MD;  Location: [facility];  Service: General;  Laterality: N/A;  . COLONOSCOPY    . DILATION AND CURETTAGE OF UTERUS  age 37  . ESOPHAGOGASTRODUODENOSCOPY    . ORIF ANKLE FRACTURE Left 07/12/2017   Procedure: OPEN REDUCTION INTERNAL FIXATION (ORIF) LEFT ANKLE FRACTURE;  Surgeon: Carole Civil, MD;  Location: [facility];  Service: Orthopedics;  Laterality: Left;  . ROTATOR CUFF REPAIR     right  .  SPINAL CORD STIMULATOR IMPLANT  6-7 yrs ago   lower back, pt turned off 2-3 weeks ago due to causing pain  . TOTAL ABDOMINAL HYSTERECTOMY W/ BILATERAL SALPINGOOPHORECTOMY    . TUBAL LIGATION      There were no vitals filed for this visit.  Subjective Assessment - 11/07/17 0832    Subjective  Pt entered wearing CAM boot with reports of increased pain, reports she has been increased weight bearing for the last 5 weeks, pain scale 7/10.    Pertinent History  S/p ORIF for trimalleolar fracture of left ankle October 2018    Patient Stated Goals  To have less pain     Currently in Pain?  Yes    Pain Score  7     Pain Location  Ankle    Pain Orientation  Left    Pain  Descriptors / Indicators  Sore;Aching;Sharp    Pain Type  Chronic pain    Pain Onset  More than a month ago    Pain Frequency  Constant    Aggravating Factors   standing, walking    Pain Relieving Factors  elevate, medicine    Effect of Pain on Daily Activities  severly limited         [facility] PT Assessment - 11/07/17 0001      Assessment   Medical Diagnosis  S/p ankle fracture ORIF    Referring Provider  Arther Abbott MD    Onset Date/Surgical Date  07/04/17    Next MD Visit  11/28/2017    Prior Therapy  For back pain, but not for ankle      Precautions   Required Braces or Orthoses  Other Brace/Splint    Other Brace/Splint  CAM Boot                  [facility] Adult PT Treatment/Exercise - 11/07/17 0001      Exercises   Exercises  Ankle      Manual Therapy   Manual Therapy  Edema management;Joint mobilization;Passive ROM    Manual therapy comments  Manual complete separate than rest of tx    Edema Management  Retro massage with LE elevated    Joint Mobilization  talus mob during DF    Passive ROM  all directions as tolerated      Ankle Exercises: Seated   ABC's  1 rep    Ankle Circles/Pumps  10 reps    Towel Inversion/Eversion  1 rep             PT Education - 11/07/17 0900    Education provided  Yes    Education Details  Reviewed goals, assured compliance wiht HEP and copy of eval given to pt.      Person(s) Educated  Patient    Methods  Explanation;Verbal cues;Handout    Comprehension  Verbalized understanding;Returned demonstration;Need further instruction       PT Short Term Goals - 11/01/17 2213      PT SHORT TERM GOAL #1   Title  Patient will demonstrate understanding and report regular compliance of HEP.     Time  3    Period  Weeks    Status  New    Target Date  11/22/17      PT SHORT TERM GOAL #2   Title  Patient will demonstrate improvement in active range of motion of 5 degrees in left ankle plantarflexion, dorsiflexion,  inversion and eversion in order to improve patient's mechanics and tolerance  with gait.    Baseline  Patient demonstrated -3 degrees of dorsiflexion, 15 degrees of plantarflexion, 10 degrees of inversion, and 8 degrees of eversion at evaluation.    Time  3    Period  Weeks    Status  New    Target Date  11/22/17      PT SHORT TERM GOAL #3   Title  Patient will demonstrate ability to ambulate at a velocity of at least 0.8 m/s during 3MWT and ambulating at least 500 feet as evidence of improved gait ambulation.      Baseline  Patient ambulated 452 feet this session at a 0.76 m/s velocity.     Time  3    Period  Weeks    Status  New    Target Date  11/22/17        PT Long Term Goals - 11/01/17 2214      PT LONG TERM GOAL #1   Title  Patient will demonstrate ability to perform 5 times sit to stand in less than 12 seconds as evidence of improvement in ability to perform sit to stands and decreased risk of falls.     Baseline  Patient performed 5 times sit to stand in 14 seconds at evaluation.     Time  6    Period  Weeks    Status  New    Target Date  12/13/17      PT LONG TERM GOAL #2   Title  Patient will demonstrate active dorsiflexion range of motion of the left ankle equal to that of the right ankle, and improvement in left ankle active range of motion in plantarflexion, inversion and eversion of 10 degrees, in order to assist patient with performance of ambulation.      Baseline  Patient demonstrated -3 degrees of dorsiflexion, 15 degrees of plantarflexion, 10 degrees of inversion, and 8 degrees of eversion at evaluation.    Time  6    Period  Weeks    Status  New    Target Date  12/13/17      PT LONG TERM GOAL #3   Title  Patient will demonstrate ability to ambulate at a velocity of 1.0 m/s with 3 MWT without visible circumduction of left lower extremity and without exacerbation of pain as evidence of improved ambulation.    Baseline  Patient ambulated at a velocity of 0.76  m/s with CAM boot during 3 MWT, with visible circumduction of left lower extremity and reported pain.     Time  6    Period  Weeks    Status  New    Target Date  12/13/17      PT LONG TERM GOAL #4   Title  Patient will report ability to ambulate for 30 minutes in order to assist with ambulation through grocery store without use of motorized wheelchair scooter.     Baseline  Patient reported that she used the motorized wheelchair scooter whenever she goes to the store and cannot walk comfortably for more than 20 minutes.     Time  6    Period  Weeks    Status  New    Target Date  12/13/17      PT LONG TERM GOAL #5   Title  Patient will demonstrate improvement of 1 MMT or 5/5 MMT grade in all deficient planes as evidence of improved strength to assist with functional mobility.     Baseline  Patient is deficient in  all planes of strength testing on the left ankle, knee flexion, hip abduction, hip extension, and knee flexion.     Time  6    Period  Weeks    Status  New    Target Date  12/13/17            Plan - 11/07/17 0913    Clinical Impression Statement  Reviewed goals, assured compliance iwth HEP and copy of eval given to pt.  Minimal cueing for form with HEP to reduce compensation of knee movements and increase ankle movement..  Manual technqiues complete to addressed edema, PROM witj joint mobs complete to talus to reduce popping with DF movements.  Therex focus on ankle mobility.  Pt limited by pain through session.      Rehab Potential  Fair    Clinical Impairments Affecting Rehab Potential  Positive: Patient motivation; Negative: comorbidities, severity of pain    PT Frequency  2x / week    PT Duration  6 weeks    PT Treatment/Interventions  ADLs/Self Care Home Management;Cryotherapy;DME Instruction;Gait training;Stair training;Functional mobility training;Therapeutic activities;Therapeutic exercise;Balance training;Neuromuscular re-education;Patient/family education;Orthotic  Fit/Training;Manual techniques;Passive range of motion;Compression bandaging;Taping    PT Next Visit Plan  Next session add 4-way hip exercise to HEP, and gentle AROM exercises for ankle. Focus on improving ROM and decreasing edema initially.     PT Home Exercise Plan  Ankle ABC's for Left ankle 1 x 2 times a day       Patient will benefit from skilled therapeutic intervention in order to improve the following deficits and impairments:  Abnormal gait, Decreased balance, Decreased endurance, Decreased mobility, Difficulty walking, Impaired sensation, Decreased range of motion, Increased edema, Improper body mechanics, Decreased activity tolerance, Decreased strength, Pain  Visit Diagnosis: S/P ORIF (open reduction internal fixation) fracture  Other abnormalities of gait and mobility  Decreased range of motion of left ankle  Decreased strength     Problem List Patient Active Problem List   Diagnosis Date Noted  . S/P ORIF (open reduction internal fixation) fracture 07/12/17  07/19/2017  . Trimalleolar fracture of ankle, closed, left, with routine healing, subsequent encounter 07/12/2017  . Closed trimalleolar fracture of left ankle   . Insomnia 02/21/2012  . Routine health maintenance 07/25/2011  . DEPRESSION/ANXIETY 06/17/2010  . Attention deficit disorder 02/02/2009  . Osteopenia 10/23/2007  . CHRONIC PAIN SYNDROME 09/11/2007  . HEMORRHOIDS 09/11/2007  . IRRITABLE BOWEL SYNDROME 09/11/2007  . OSTEOARTHRITIS 09/11/2007   Ihor Austin, [place]; [place]  Aldona Lento 11/07/2017, 12:09 PM  [place], [place], [address] Phone: [phone]   Fax:  [phone]  Name: [name] [name] MRN: [id number] Date of Birth: [birth year]

## 2017-11-09 ENCOUNTER — Ambulatory Visit (HOSPITAL_COMMUNITY): Payer: Medicaid Other

## 2017-11-09 ENCOUNTER — Encounter (HOSPITAL_COMMUNITY): Payer: Self-pay

## 2017-11-09 DIAGNOSIS — Z9889 Other specified postprocedural states: Secondary | ICD-10-CM

## 2017-11-09 DIAGNOSIS — R2689 Other abnormalities of gait and mobility: Secondary | ICD-10-CM

## 2017-11-09 DIAGNOSIS — M25672 Stiffness of left ankle, not elsewhere classified: Secondary | ICD-10-CM

## 2017-11-09 DIAGNOSIS — Z967 Presence of other bone and tendon implants: Secondary | ICD-10-CM | POA: Diagnosis not present

## 2017-11-09 DIAGNOSIS — Z8781 Personal history of (healed) traumatic fracture: Secondary | ICD-10-CM

## 2017-11-09 DIAGNOSIS — R531 Weakness: Secondary | ICD-10-CM

## 2017-11-09 NOTE — Therapy (Signed)
[name] [name], [place], [address] Phone: [phone]   Fax:  [phone]  Physical Therapy Treatment  Patient Details  Name: [name] [name] MRN: [id number] Date of Birth: 11-Oct-1955 Referring Provider: Arther Abbott, MD   Encounter Date: 11/09/2017  PT End of Session - 11/09/17 1439    Visit Number  3    Number of Visits  13    Date for PT Re-Evaluation  11/23/17    Authorization Type  Medicaid [place] A    Authorization Time Period  Requesting 3 visits 02/13--11/13/17    Authorization - Visit Number  2    Authorization - Number of Visits  3    PT Start Time  5573    PT Stop Time  1512    PT Time Calculation (min)  38 min    Equipment Utilized During Treatment  -- CAM Boot    Activity Tolerance  Patient limited by pain    Behavior During Therapy  [facility] for tasks assessed/performed       Past Medical History:  Diagnosis Date  . ABDOMINAL PAIN, LOWER 02/16/2009  . APPENDECTOMY, HX OF 09/11/2007  . ATTENTION DEFICIT DISORDER, ADULT 02/02/2009  . BACK PAIN 07/16/2007   chronic  . Back pain    spinal stimulator in place  . CERVICAL RADICULOPATHY, RIGHT 07/16/2007  . CHEST PAIN, ACUTE 09/03/2008  . Chronic abdominal pain   . Chronic pain syndrome 09/11/2007  . Complication of anesthesia    woke up during surgeries few times and woke up during endoscopy and colonscopy few weeks ago, in terrible pain  . DDD (degenerative disc disease), lumbosacral   . DEPRESSION/ANXIETY 06/17/2010  . Dysuria 02/20/2008  . Elevated liver enzymes 1 and  1/2 months ago  . Family history of anesthesia complication    mother woke up during surgeries  . Fibromyalgia   . Functional GI symptoms   . FX, RAMUS NOS, CLOSED 07/16/2007  . GERD (gastroesophageal reflux disease)   . H/O failed conscious sedation    PT STATES SHE IS HARD TO SEDATE!  . HEMORRHOIDS 09/11/2007  . HIATAL HERNIA, HX OF 09/11/2007  . History of kidney stones   .  HX, PERSONAL, MUSCULOSKELETAL DISORD NEC 07/16/2007  . HX, PERSONAL, URINARY CALCULI 07/16/2007  . Irritable bowel syndrome 09/11/2007  . NEOPLASM, SKIN, UNCERTAIN BEHAVIOR 10/27/252  . OSTEOARTHRITIS 09/11/2007   oa  . OSTEOPENIA 10/23/2007  . PONV (postoperative nausea and vomiting)   . ROTATOR CUFF REPAIR, HX OF 09/11/2007  . Suicidal ideations   . SYMPTOM, PAIN, ABDOMINAL, RIGHT UP QUADRANT 07/16/2007  . TAH/BSO, HX OF 09/11/2007  . UTI 10/06/2010  . VAGINITIS, ATROPHIC, POSTMENOPAUSAL 07/16/2007    Past Surgical History:  Procedure Laterality Date  . ABDOMINAL HYSTERECTOMY    . APPENDECTOMY    . bone pushed back into  place after fracture Left 25-30 yrs ago   face  . cervical radiculopathy  20 yrs ago   RT  . CHOLECYSTECTOMY N/A 02/18/2014   Procedure: LAPAROSCOPIC CHOLECYSTECTOMY;  Surgeon: Harl Bowie, MD;  Location: [facility];  Service: General;  Laterality: N/A;  . COLONOSCOPY    . DILATION AND CURETTAGE OF UTERUS  age 37  . ESOPHAGOGASTRODUODENOSCOPY    . ORIF ANKLE FRACTURE Left 07/12/2017   Procedure: OPEN REDUCTION INTERNAL FIXATION (ORIF) LEFT ANKLE FRACTURE;  Surgeon: Carole Civil, MD;  Location: [facility];  Service: Orthopedics;  Laterality: Left;  . ROTATOR CUFF REPAIR  right  . SPINAL CORD STIMULATOR IMPLANT  6-7 yrs ago   lower back, pt turned off 2-3 weeks ago due to causing pain  . TOTAL ABDOMINAL HYSTERECTOMY W/ BILATERAL SALPINGOOPHORECTOMY    . TUBAL LIGATION      There were no vitals filed for this visit.  Subjective Assessment - 11/09/17 1432    Subjective  Pt stated she has increased pain today, pain scale 8/10 "feels like the skin is coming off the top of her foot".      Patient Stated Goals  To have less pain     Currently in Pain?  Yes    Pain Score  8     Pain Location  Ankle    Pain Orientation  Left;Medial    Pain Type  Chronic pain    Pain Onset  More than a month ago    Pain Frequency  Constant    Aggravating Factors    standing, walking    Pain Relieving Factors  elevate, medicine    Effect of Pain on Daily Activities  severly limited          [facility] PT Assessment - 11/09/17 0001      Assessment   Medical Diagnosis  S/p ankle fracture ORIF    Referring Provider  Arther Abbott, MD    Onset Date/Surgical Date  07/04/17    Next MD Visit  11/28/2017    Prior Therapy  For back pain, but not for ankle      Precautions   Required Braces or Orthoses  Other Brace/Splint    Other Brace/Splint  CAM Boot                  [facility] Adult PT Treatment/Exercise - 11/09/17 0001      Manual Therapy   Manual Therapy  Other (comment)    Other Manual Therapy  Held manual per request       Ankle Exercises: Supine   Other Supine Ankle Exercises  Prone and supine SLR 10x BLE (cueing for form)      Ankle Exercises: Sidelying   Other Sidelying Ankle Exercises  BLE hip abd/ add 10x      Ankle Exercises: Seated   Ankle Circles/Pumps  10 reps    Heel Raises  10 reps    Toe Raise  10 reps    BAPS  Sitting;Level 2;10 reps             PT Education - 11/09/17 1518    Education provided  Yes    Education Details  Desentivity techniques with different fabric, RICE for pain and edema control, gait training     Person(s) Educated  Patient    Methods  Explanation;Demonstration;Handout    Comprehension  Verbalized understanding;Returned demonstration;Need further instruction       PT Short Term Goals - 11/01/17 2213      PT SHORT TERM GOAL #1   Title  Patient will demonstrate understanding and report regular compliance of HEP.     Time  3    Period  Weeks    Status  New    Target Date  11/22/17      PT SHORT TERM GOAL #2   Title  Patient will demonstrate improvement in active range of motion of 5 degrees in left ankle plantarflexion, dorsiflexion, inversion and eversion in order to improve patient's mechanics and tolerance with gait.    Baseline  Patient demonstrated -3 degrees of dorsiflexion,  15 degrees of plantarflexion,  10 degrees of inversion, and 8 degrees of eversion at evaluation.    Time  3    Period  Weeks    Status  New    Target Date  11/22/17      PT SHORT TERM GOAL #3   Title  Patient will demonstrate ability to ambulate at a velocity of at least 0.8 m/s during 3MWT and ambulating at least 500 feet as evidence of improved gait ambulation.      Baseline  Patient ambulated 452 feet this session at a 0.76 m/s velocity.     Time  3    Period  Weeks    Status  New    Target Date  11/22/17        PT Long Term Goals - 11/01/17 2214      PT LONG TERM GOAL #1   Title  Patient will demonstrate ability to perform 5 times sit to stand in less than 12 seconds as evidence of improvement in ability to perform sit to stands and decreased risk of falls.     Baseline  Patient performed 5 times sit to stand in 14 seconds at evaluation.     Time  6    Period  Weeks    Status  New    Target Date  12/13/17      PT LONG TERM GOAL #2   Title  Patient will demonstrate active dorsiflexion range of motion of the left ankle equal to that of the right ankle, and improvement in left ankle active range of motion in plantarflexion, inversion and eversion of 10 degrees, in order to assist patient with performance of ambulation.      Baseline  Patient demonstrated -3 degrees of dorsiflexion, 15 degrees of plantarflexion, 10 degrees of inversion, and 8 degrees of eversion at evaluation.    Time  6    Period  Weeks    Status  New    Target Date  12/13/17      PT LONG TERM GOAL #3   Title  Patient will demonstrate ability to ambulate at a velocity of 1.0 m/s with 3 MWT without visible circumduction of left lower extremity and without exacerbation of pain as evidence of improved ambulation.    Baseline  Patient ambulated at a velocity of 0.76 m/s with CAM boot during 3 MWT, with visible circumduction of left lower extremity and reported pain.     Time  6    Period  Weeks    Status  New     Target Date  12/13/17      PT LONG TERM GOAL #4   Title  Patient will report ability to ambulate for 30 minutes in order to assist with ambulation through grocery store without use of motorized wheelchair scooter.     Baseline  Patient reported that she used the motorized wheelchair scooter whenever she goes to the store and cannot walk comfortably for more than 20 minutes.     Time  6    Period  Weeks    Status  New    Target Date  12/13/17      PT LONG TERM GOAL #5   Title  Patient will demonstrate improvement of 1 MMT or 5/5 MMT grade in all deficient planes as evidence of improved strength to assist with functional mobility.     Baseline  Patient is deficient in all planes of strength testing on the left ankle, knee flexion, hip abduction, hip extension, and knee  flexion.     Time  6    Period  Weeks    Status  New    Target Date  12/13/17            Plan - 11/09/17 1445    Clinical Impression Statement  Pt limited by high levels of ankle pain this session, reports increased intoleranced to even touching foot.  Pt educated on desentivity techqniues to improve tolerance.  Pt reports increased LBP with gait.  Pt educated on importance of appropriate gait mechanics (with CAM boot) to assist with foot and back pain.  Added hip strengthening to POC to improve gluteal strengthening to assist with gait, pt able to demonstrate appropriate form/techniques and given additional HEP.  Session foucs on ankle mobility as pt wished to hold manual technqiues this session.  No reoprts of increased pain, does continue to be limited by high pain scale through session.    Rehab Potential  Fair    Clinical Impairments Affecting Rehab Potential  Positive: Patient motivation; Negative: comorbidities, severity of pain    PT Frequency  2x / week    PT Duration  6 weeks    PT Treatment/Interventions  ADLs/Self Care Home Management;Cryotherapy;DME Instruction;Gait training;Stair training;Functional mobility  training;Therapeutic activities;Therapeutic exercise;Balance training;Neuromuscular re-education;Patient/family education;Orthotic Fit/Training;Manual techniques;Passive range of motion;Compression bandaging;Taping    PT Next Visit Plan  Continue wiht ankle mobilty and technqieus to assist with edema control.  F/U wiht compliance with additional HEP.      PT Home Exercise Plan  Ankle ABC's for Left ankle 1 x 2 times a day; 4 way SLR all directions.       Patient will benefit from skilled therapeutic intervention in order to improve the following deficits and impairments:  Abnormal gait, Decreased balance, Decreased endurance, Decreased mobility, Difficulty walking, Impaired sensation, Decreased range of motion, Increased edema, Improper body mechanics, Decreased activity tolerance, Decreased strength, Pain  Visit Diagnosis: S/P ORIF (open reduction internal fixation) fracture  Other abnormalities of gait and mobility  Decreased range of motion of left ankle  Decreased strength     Problem List Patient Active Problem List   Diagnosis Date Noted  . S/P ORIF (open reduction internal fixation) fracture 07/12/17  07/19/2017  . Trimalleolar fracture of ankle, closed, left, with routine healing, subsequent encounter 07/12/2017  . Closed trimalleolar fracture of left ankle   . Insomnia 02/21/2012  . Routine health maintenance 07/25/2011  . DEPRESSION/ANXIETY 06/17/2010  . Attention deficit disorder 02/02/2009  . Osteopenia 10/23/2007  . CHRONIC PAIN SYNDROME 09/11/2007  . HEMORRHOIDS 09/11/2007  . IRRITABLE BOWEL SYNDROME 09/11/2007  . OSTEOARTHRITIS 09/11/2007   Ihor Austin, [place]; [place]  Aldona Lento 11/09/2017, 3:44 PM  [place] [address] [place], [place], [address] Phone: [phone]   Fax:  [phone]  Name: [name] [name] MRN: [id number] Date of Birth: Feb 18, 1956

## 2017-11-09 NOTE — Patient Instructions (Signed)
HIP: Flexion / KNEE: Extension, Straight Leg Raise    Raise leg, keeping knee straight. Perform slowly. 10 reps per set, 1-2 sets per day.   Copyright  VHI. All rights reserved.   Straight Leg Raise (Prone)    Abdomen and head supported, keep left knee locked and raise leg at hip. Avoid arching low back. Repeat 10 times per set. Do 1-2 sets per session.   http://orth.exer.us/1113   Copyright  VHI. All rights reserved.   Abduction    Lift leg up toward ceiling. Return.  Repeat 10 times each leg. Do 1-2 sessions per day.  http://gt2.exer.us/386   Copyright  VHI. All rights reserved.

## 2017-11-12 ENCOUNTER — Encounter (HOSPITAL_COMMUNITY): Payer: Self-pay

## 2017-11-12 ENCOUNTER — Ambulatory Visit (HOSPITAL_COMMUNITY): Payer: Medicaid Other

## 2017-11-12 DIAGNOSIS — R2689 Other abnormalities of gait and mobility: Secondary | ICD-10-CM

## 2017-11-12 DIAGNOSIS — Z8781 Personal history of (healed) traumatic fracture: Secondary | ICD-10-CM

## 2017-11-12 DIAGNOSIS — Z9889 Other specified postprocedural states: Secondary | ICD-10-CM

## 2017-11-12 DIAGNOSIS — Z967 Presence of other bone and tendon implants: Secondary | ICD-10-CM | POA: Diagnosis not present

## 2017-11-12 DIAGNOSIS — M25672 Stiffness of left ankle, not elsewhere classified: Secondary | ICD-10-CM

## 2017-11-12 DIAGNOSIS — R531 Weakness: Secondary | ICD-10-CM

## 2017-11-12 NOTE — Patient Instructions (Signed)
Scar Tissue Massage    Place pad of fingertip on scar area. Apply steady downward pressure while moving in circular fashion. Use another fin-ger on top to assist. Repeat until entire scar has been covered. 12:00 to 6:00; 3:00 to 9:00; clockwise and counterclockwise Repeat 10____ times. Do _2___ sessions per day.  Copyright  VHI. All rights reserved.

## 2017-11-12 NOTE — Therapy (Addendum)
[place], [place], [address] Phone: [phone]   Fax:  [phone]  Physical Therapy Re-EvaluationTreatment  Patient Details  Name: [name] [name] MRN: [id number] Date of Birth: 1956/03/25 Referring Provider: Arther Abbott, MD   Encounter Date: 11/12/2017  PT End of Session - 11/12/17 1211    Visit Number  4    Number of Visits  13    Date for PT Re-Evaluation  11/23/17    Authorization Type  Medicaid [place] [facility]    Authorization Time Period  Requesting 3 visits 02/13--11/13/17    Authorization - Visit Number  3    Authorization - Number of Visits  3    PT Start Time  8546    PT Stop Time  1201    PT Time Calculation (min)  40 min    Equipment Utilized During Treatment  -- CAM Boot    Activity Tolerance  Patient limited by pain    Behavior During Therapy  [facility] for tasks assessed/performed       Past Medical History:  Diagnosis Date  . ABDOMINAL PAIN, LOWER 02/16/2009  . APPENDECTOMY, HX OF 09/11/2007  . ATTENTION DEFICIT DISORDER, ADULT 02/02/2009  . BACK PAIN 07/16/2007   chronic  . Back pain    spinal stimulator in place  . CERVICAL RADICULOPATHY, RIGHT 07/16/2007  . CHEST PAIN, ACUTE 09/03/2008  . Chronic abdominal pain   . Chronic pain syndrome 09/11/2007  . Complication of anesthesia    woke up during surgeries few times and woke up during endoscopy and colonscopy few weeks ago, in terrible pain  . DDD (degenerative disc disease), lumbosacral   . DEPRESSION/ANXIETY 06/17/2010  . Dysuria 02/20/2008  . Elevated liver enzymes 1 and  1/2 months ago  . Family history of anesthesia complication    mother woke up during surgeries  . Fibromyalgia   . Functional GI symptoms   . FX, RAMUS NOS, CLOSED 07/16/2007  . GERD (gastroesophageal reflux disease)   . H/O failed conscious sedation    PT STATES SHE IS HARD TO SEDATE!  . HEMORRHOIDS 09/11/2007  . HIATAL HERNIA, HX OF 09/11/2007  . History of  kidney stones   . HX, PERSONAL, MUSCULOSKELETAL DISORD NEC 07/16/2007  . HX, PERSONAL, URINARY CALCULI 07/16/2007  . Irritable bowel syndrome 09/11/2007  . NEOPLASM, SKIN, UNCERTAIN BEHAVIOR 11/01/348  . OSTEOARTHRITIS 09/11/2007   oa  . OSTEOPENIA 10/23/2007  . PONV (postoperative nausea and vomiting)   . ROTATOR CUFF REPAIR, HX OF 09/11/2007  . Suicidal ideations   . SYMPTOM, PAIN, ABDOMINAL, RIGHT UP QUADRANT 07/16/2007  . TAH/BSO, HX OF 09/11/2007  . UTI 10/06/2010  . VAGINITIS, ATROPHIC, POSTMENOPAUSAL 07/16/2007    Past Surgical History:  Procedure Laterality Date  . ABDOMINAL HYSTERECTOMY    . APPENDECTOMY    . bone pushed back into  place after fracture Left 25-30 yrs ago   face  . cervical radiculopathy  20 yrs ago   RT  . CHOLECYSTECTOMY N/A 02/18/2014   Procedure: LAPAROSCOPIC CHOLECYSTECTOMY;  Surgeon: Harl Bowie, MD;  Location: [facility];  Service: General;  Laterality: N/A;  . COLONOSCOPY    . DILATION AND CURETTAGE OF UTERUS  age 41  . ESOPHAGOGASTRODUODENOSCOPY    . ORIF ANKLE FRACTURE Left 07/12/2017   Procedure: OPEN REDUCTION INTERNAL FIXATION (ORIF) LEFT ANKLE FRACTURE;  Surgeon: Carole Civil, MD;  Location: [facility];  Service: Orthopedics;  Laterality: Left;  . ROTATOR CUFF REPAIR  right  . SPINAL CORD STIMULATOR IMPLANT  6-7 yrs ago   lower back, pt turned off 2-3 weeks ago due to causing pain  . TOTAL ABDOMINAL HYSTERECTOMY W/ BILATERAL SALPINGOOPHORECTOMY    . TUBAL LIGATION      There were no vitals filed for this visit.  Subjective Assessment - 11/12/17 1213    Subjective  Pt states she stills feels bruised and sore from massage the other day. Only wears CAM boot if leaving house because it hurts to wear it. Pain overall better today than last session.    Pertinent History  S/p ORIF for trimalleolar fracture of left ankle October 2018    Limitations  Lifting;Standing;Walking;House hold activities    How long can you sit comfortably?   Couple hours    How long can you stand comfortably?  20 minutes    How long can you walk comfortably?  20 minutes    Diagnostic tests  X-ray: Last x-ray indicated stable fixation of Left ankle without hardware complications    Patient Stated Goals  To have less pain     Currently in Pain?  Yes    Pain Score  5     Pain Location  Foot    Pain Orientation  Left;Medial    Pain Descriptors / Indicators  Sore;Sharp;Aching;Burning;Numbness    Pain Type  Chronic pain    Pain Onset  More than a month ago    Pain Frequency  Constant    Aggravating Factors   wearing boot - 5/10 today    Pain Relieving Factors  taking boot off - 4/10 today    Effect of Pain on Daily Activities  severely limited    Pain Score  5    Pain Location  Back    Pain Orientation  Lower    Pain Descriptors / Indicators  Pressure    Pain Type  Chronic pain    Pain Radiating Towards  Down the right leg a times    Pain Onset  More than a month ago    Pain Frequency  Constant    Aggravating Factors   standing, walking    Pain Relieving Factors  medicinee, laying down on heating pad    Effect of Pain on Daily Activities  moderate limitiation         [facility] PT Assessment - 11/12/17 0001      Assessment   Medical Diagnosis  S/p ankle fracture ORIF    Referring Provider  Arther Abbott, MD    Onset Date/Surgical Date  07/04/17    Next MD Visit  11/28/2017    Prior Therapy  For back pain, but not for ankle      Precautions   Required Braces or Orthoses  Other Brace/Splint    Other Brace/Splint  CAM Boot      Restrictions   Weight Bearing Restrictions  No when in CAM boot      Balance Screen   Has the patient fallen in the past 6 months  Yes    How many times?  5    Has the patient had a decrease in activity level because of a fear of falling?   Yes    Is the patient reluctant to leave their home because of a fear of falling?   Yes      Cognition   Overall Cognitive Status  Within Functional Limits for tasks  assessed      Observation/Other Assessments   Observations  patients left  ankle and lower shin are of purplish hue and shiny    Skin Integrity  3 surgical incisions are closed however medial incision appears to have an adhesion just posterior to medial malleolus      Sensation   Light Touch  Impaired by gross assessment      AROM   Left Ankle Dorsiflexion  -2    Left Ankle Inversion  10    Left Ankle Eversion  12      Flexibility   Soft Tissue Assessment /Muscle Length  --      Ambulation/Gait   Ambulation/Gait Assistance  7: Independent in CAM boot    Assistive device  None    Gait Pattern  Step-through pattern;Decreased step length - right;Decreased stance time - left;Decreased stride length;Decreased weight shift to left;Left circumduction    Ambulation Surface  Level      Functional Gait  Assessment   Gait assessed   --                  [facility] Adult PT Treatment/Exercise - 11/12/17 0001      Manual Therapy   Manual Therapy  Edema management    Manual therapy comments  scar tissue mobs and desensitization      Ankle Exercises: Seated   ABC's  --    Towel Inversion/Eversion  1 rep    Heel Raises  10 reps    Toe Raise  10 reps    BAPS  Sitting;Level 2;10 reps             PT Education - 11/12/17 1210    Education Details  Desensitivity and scar mobilization techniques on BLEs simultaneously with BUE to normalize sensation; educated on nerve damage and re-growth signs, symptoms and characteristics - ie, numb is bad and burning is better    Person(s) Educated  Patient    Methods  Explanation;Demonstration;Handout    Comprehension  Verbalized understanding       PT Short Term Goals - 11/12/17 1224      PT SHORT TERM GOAL #1   Title  Patient will demonstrate understanding and report regular compliance of HEP.     Time  3    Period  Weeks    Status  On-going      PT SHORT TERM GOAL #2   Title  Patient will demonstrate improvement in active range  of motion of 5 degrees in left ankle plantarflexion, dorsiflexion, inversion and eversion in order to improve patient's mechanics and tolerance with gait.    Baseline  Patient demonstrated -3 degrees of dorsiflexion, 15 degrees of plantarflexion, 10 degrees of inversion, and 8 degrees of eversion at evaluation.; 11/12/2017 - DF largely the same, INV - inc to 12; EVER - inc to 10    Time  3    Period  Weeks    Status  On-going      PT SHORT TERM GOAL #3   Title  Patient will demonstrate ability to ambulate at a velocity of at least 0.8 m/s during 3MWT and ambulating at least 500 feet as evidence of improved gait ambulation.      Baseline  Patient ambulated 452 feet this session at a 0.76 m/s velocity. 11/12/17 - patient continues to have relatively slow ambulation with deficits antaglia, step length, stride length and circumduction of LLE    Time  3    Period  Weeks    Status  On-going  PT Long Term Goals - 11/12/17 1224      PT LONG TERM GOAL #1   Title  Patient will demonstrate ability to perform 5 times sit to stand in less than 12 seconds as evidence of improvement in ability to perform sit to stands and decreased risk of falls.     Baseline  Patient performed 5 times sit to stand in 14 seconds at evaluation.     Time  6    Period  Weeks    Status  On-going      PT LONG TERM GOAL #2   Title  Patient will demonstrate active dorsiflexion range of motion of the left ankle equal to that of the right ankle, and improvement in left ankle active range of motion in plantarflexion, inversion and eversion of 10 degrees, in order to assist patient with performance of ambulation.      Baseline  Patient demonstrated -3 degrees of dorsiflexion, 15 degrees of plantarflexion, 10 degrees of inversion, and 8 degrees of eversion at evaluation.    Time  6    Period  Weeks    Status  On-going      PT LONG TERM GOAL #3   Title  Patient will demonstrate ability to ambulate at a velocity  of 1.0 m/s with 3 MWT without visible circumduction of left lower extremity and without exacerbation of pain as evidence of improved ambulation.    Baseline  Patient ambulated at a velocity of 0.76 m/s with CAM boot during 3 MWT, with visible circumduction of left lower extremity and reported pain.     Time  6    Period  Weeks    Status  On-going      PT LONG TERM GOAL #4   Title  Patient will report ability to ambulate for 30 minutes in order to assist with ambulation through grocery store without use of motorized wheelchair scooter.     Baseline  Patient reported that she used the motorized wheelchair scooter whenever she goes to the store and cannot walk comfortably for more than 20 minutes.     Time  6    Period  Weeks    Status  On-going      PT LONG TERM GOAL #5   Title  Patient will demonstrate improvement of 1 MMT or 5/5 MMT grade in all deficient planes as evidence of improved strength to assist with functional mobility.     Baseline  Patient is deficient in all planes of strength testing on the left ankle, knee flexion, hip abduction, hip extension, and knee flexion.     Time  6    Period  Weeks    Status  On-going            Plan - 11/12/17 1212    Clinical Impression Statement  Session not as limited by pain complaints. Patient's pain appears to be hypersensitive in nature in connection largely with her medial surgical icision that exhibits a few adhesions, particularly just posterior to the medial malleolus. There is concern regarding progression to CRPS (Compound Regional Pain Syndrome). She is hypersensitive here and also at the distal end of the incision. Spent much of the session also educating patient on deterimental effects of avoiding the neural signals. Spoke with patient about wearing a thicker soled shoe on RLE to decrease the postural imbalances of wearing the CAM boot. Gave patient wear progression instructions to increase time in CAM boot each day. Patient's  overall motion, strength, functional  balance, mobility and gait skills continue to be restricted.  Patient continues to exhibit decreased, ROM, strength, balance, coordination, functional mobility and gait skills.    History and Personal Factors relevant to plan of care:  s/p ORIF left ankle fracture 07/04/2017; chronic back pain with spinal stimulator; Fibromyalgia; OA    Clinical Presentation  Stable    Clinical Presentation due to:  MMT, ROM, 3 MWT, 5x STS, clinical judgement    Clinical Decision Making  Low    Rehab Potential  Fair    Clinical Impairments Affecting Rehab Potential  Positive: Patient motivation; Negative: comorbidities, severity of pain    PT Frequency  2x / week    PT Duration  6 weeks    PT Treatment/Interventions  ADLs/Self Care Home Management;Cryotherapy;DME Instruction;Gait training;Stair training;Functional mobility training;Therapeutic activities;Therapeutic exercise;Balance training;Neuromuscular re-education;Patient/family education;Orthotic Fit/Training;Manual techniques;Passive range of motion;Compression bandaging;Taping    PT Next Visit Plan  Continue wiht ankleand soft tissue mobilty and technqieus to assist with edema control and desensitization.  F/U wiht compliance with additional HEP, including hip strengthening, scar, and CAM boot wear.      PT Home Exercise Plan  Ankle ABC's for Left ankle 1 x 2 times a day; 4 way SLR all directions. 11/12/17 - scar tissue mobs, progressive CAM boot wearing schedule.       Patient will benefit from skilled therapeutic intervention in order to improve the following deficits and impairments:  Abnormal gait, Decreased balance, Decreased endurance, Decreased mobility, Difficulty walking, Impaired sensation, Decreased range of motion, Increased edema, Improper body mechanics, Decreased activity tolerance, Decreased strength, Pain  Visit Diagnosis: S/P ORIF (open reduction internal fixation) fracture  Other abnormalities of gait  and mobility  Decreased range of motion of left ankle  Decreased strength     Problem List Patient Active Problem List   Diagnosis Date Noted  . S/P ORIF (open reduction internal fixation) fracture 07/12/17  07/19/2017  . Trimalleolar fracture of ankle, closed, left, with routine healing, subsequent encounter 07/12/2017  . Closed trimalleolar fracture of left ankle   . Insomnia 02/21/2012  . Routine health maintenance 07/25/2011  . DEPRESSION/ANXIETY 06/17/2010  . Attention deficit disorder 02/02/2009  . Osteopenia 10/23/2007  . CHRONIC PAIN SYNDROME 09/11/2007  . HEMORRHOIDS 09/11/2007  . IRRITABLE BOWEL SYNDROME 09/11/2007  . OSTEOARTHRITIS 09/11/2007    Floria Raveling. Hartnett-Rands, MS, PT [facility] #[id number] 11/12/2017, 9:11 PM  [place] [address] [place], [place], [address] Phone: [phone]   Fax:  [phone]  Name: [name] [name] MRN: [id number] Date of Birth: 01-01-1956

## 2017-11-14 ENCOUNTER — Ambulatory Visit (HOSPITAL_COMMUNITY): Payer: Medicaid Other | Admitting: Physical Therapy

## 2017-11-15 ENCOUNTER — Telehealth (HOSPITAL_COMMUNITY): Payer: Self-pay | Admitting: Family Medicine

## 2017-11-15 ENCOUNTER — Ambulatory Visit (HOSPITAL_COMMUNITY): Payer: Medicaid Other

## 2017-11-15 NOTE — Telephone Encounter (Signed)
11/15/17  no approval back from [facility] on visits past 2/19... I called patient and she said that was fine because she hasn't been feeling that well

## 2017-11-20 ENCOUNTER — Telehealth (HOSPITAL_COMMUNITY): Payer: Self-pay | Admitting: Family Medicine

## 2017-11-20 NOTE — Telephone Encounter (Signed)
11/20/17  Pt left [place] a message to say she got a call from [place] to reminder her of tomorrow's appt but she hadn't heard back from [place] saying how many visits her insurance approved.  I called her back to tell her and she said that she couldn't come 2/27 because she had already made other plans.  We did confirm the 3/1 appt.

## 2017-11-21 ENCOUNTER — Ambulatory Visit (HOSPITAL_COMMUNITY): Payer: Medicaid Other | Admitting: Physical Therapy

## 2017-11-21 ENCOUNTER — Encounter (HOSPITAL_COMMUNITY): Payer: Medicaid Other | Admitting: Physical Therapy

## 2017-11-23 ENCOUNTER — Ambulatory Visit (HOSPITAL_COMMUNITY): Payer: Medicaid Other | Admitting: Physical Therapy

## 2017-11-23 ENCOUNTER — Telehealth (HOSPITAL_COMMUNITY): Payer: Self-pay | Admitting: Family Medicine

## 2017-11-23 DIAGNOSIS — M84373A Stress fracture, unspecified ankle, initial encounter for fracture: Secondary | ICD-10-CM | POA: Diagnosis not present

## 2017-11-23 NOTE — Telephone Encounter (Signed)
11/23/17  pt cx she is having vomiting and diarrhea and I told her I would just add on to the end of her schedule

## 2017-11-28 ENCOUNTER — Encounter: Payer: Self-pay | Admitting: Orthopedic Surgery

## 2017-11-28 ENCOUNTER — Telehealth (HOSPITAL_COMMUNITY): Payer: Self-pay | Admitting: Family Medicine

## 2017-11-28 ENCOUNTER — Ambulatory Visit: Payer: Medicaid Other | Admitting: Orthopedic Surgery

## 2017-11-28 ENCOUNTER — Ambulatory Visit (HOSPITAL_COMMUNITY): Payer: Medicaid Other | Admitting: Physical Therapy

## 2017-11-28 VITALS — BP 138/88 | HR 93 | Ht 63.0 in | Wt 163.0 lb

## 2017-11-28 DIAGNOSIS — S82852D Displaced trimalleolar fracture of left lower leg, subsequent encounter for closed fracture with routine healing: Secondary | ICD-10-CM | POA: Diagnosis not present

## 2017-11-28 DIAGNOSIS — Z967 Presence of other bone and tendon implants: Secondary | ICD-10-CM | POA: Diagnosis not present

## 2017-11-28 DIAGNOSIS — Z8781 Personal history of (healed) traumatic fracture: Secondary | ICD-10-CM

## 2017-11-28 DIAGNOSIS — Z9889 Other specified postprocedural states: Secondary | ICD-10-CM

## 2017-11-28 NOTE — Progress Notes (Signed)
Routine follow-up visit  Encounter Diagnoses  Name Primary?  . S/P ORIF (open reduction internal fixation) fracture 07/12/17  Yes  . Trimalleolar fracture of ankle, closed, left, with routine healing, subsequent encounter    BP 138/88   Pulse 93   Ht 5\' 3"  (1.6 m)   Wt 163 lb (73.9 kg)   BMI 28.87 kg/m   5 months after trimalleolar fracture dislocation of the ankle treated with open treatment internal fixation she developed some postoperative stiffness and has some dorsal numbness  She also developed some osteopenia and there was question if there was complex regional pain syndrome  After therapy she says she is much better she is now ambulatory with no assistive devices and no significant limping  Her exam shows excellent plantar flexion 5 degrees dorsiflexion hypersensitivity to touch over the anterior wound decreased sensation of the toes and dorsum of the foot color and capillary refill normal  Recommend follow-up in October for x-rays at one year follow-up

## 2017-11-28 NOTE — Patient Instructions (Signed)
Desensitization with toothbrush therapy  Follow-up in October

## 2017-11-28 NOTE — Telephone Encounter (Signed)
11/28/17  has an appt with Dr. Aline Brochure today at 2 and can't come here so we rescheduled appt for 3/7

## 2017-11-29 ENCOUNTER — Telehealth (HOSPITAL_COMMUNITY): Payer: Self-pay | Admitting: Family Medicine

## 2017-11-29 ENCOUNTER — Telehealth (HOSPITAL_COMMUNITY): Payer: Self-pay | Admitting: Physical Therapy

## 2017-11-29 ENCOUNTER — Ambulatory Visit (HOSPITAL_COMMUNITY): Payer: Medicaid Other | Attending: Orthopedic Surgery | Admitting: Physical Therapy

## 2017-11-29 DIAGNOSIS — R531 Weakness: Secondary | ICD-10-CM | POA: Diagnosis present

## 2017-11-29 DIAGNOSIS — Z967 Presence of other bone and tendon implants: Secondary | ICD-10-CM | POA: Insufficient documentation

## 2017-11-29 DIAGNOSIS — Z9889 Other specified postprocedural states: Secondary | ICD-10-CM

## 2017-11-29 DIAGNOSIS — R2689 Other abnormalities of gait and mobility: Secondary | ICD-10-CM | POA: Diagnosis present

## 2017-11-29 DIAGNOSIS — Z8781 Personal history of (healed) traumatic fracture: Secondary | ICD-10-CM

## 2017-11-29 DIAGNOSIS — M25672 Stiffness of left ankle, not elsewhere classified: Secondary | ICD-10-CM | POA: Diagnosis present

## 2017-11-29 NOTE — Therapy (Signed)
[place], [place], [address] Phone: [phone]   Fax:  [phone]  Physical Therapy Treatment  Patient Details  Name: [name] [name] MRN: [id number] Date of Birth: [birth year] Referring Provider: Arther Abbott, MD   Encounter Date: 11/29/2017  PT End of Session - 11/29/17 1711    Visit Number  5    Number of Visits  13    Date for PT Re-Evaluation  12/13/17    Authorization Type  Medicaid [place] [employer]    Authorization Time Period  10 units approved  2/21--12/19/17    Authorization - Visit Number  1    Authorization - Number of Visits  10    PT Start Time  1625    PT Stop Time  1710    PT Time Calculation (min)  45 min    Equipment Utilized During Treatment  -- CAM Boot    Activity Tolerance  Patient limited by pain    Behavior During Therapy  [facility] for tasks assessed/performed       Past Medical History:  Diagnosis Date  . ABDOMINAL PAIN, LOWER 02/16/2009  . APPENDECTOMY, HX OF 09/11/2007  . ATTENTION DEFICIT DISORDER, ADULT 02/02/2009  . BACK PAIN 07/16/2007   chronic  . Back pain    spinal stimulator in place  . CERVICAL RADICULOPATHY, RIGHT 07/16/2007  . CHEST PAIN, ACUTE 09/03/2008  . Chronic abdominal pain   . Chronic pain syndrome 09/11/2007  . Complication of anesthesia    woke up during surgeries few times and woke up during endoscopy and colonscopy few weeks ago, in terrible pain  . DDD (degenerative disc disease), lumbosacral   . DEPRESSION/ANXIETY 06/17/2010  . Dysuria 02/20/2008  . Elevated liver enzymes 1 and  1/2 months ago  . Family history of anesthesia complication    mother woke up during surgeries  . Fibromyalgia   . Functional GI symptoms   . FX, RAMUS NOS, CLOSED 07/16/2007  . GERD (gastroesophageal reflux disease)   . H/O failed conscious sedation    PT STATES SHE IS HARD TO SEDATE!  . HEMORRHOIDS 09/11/2007  . HIATAL HERNIA, HX OF 09/11/2007  . History of kidney stones   .  HX, PERSONAL, MUSCULOSKELETAL DISORD NEC 07/16/2007  . HX, PERSONAL, URINARY CALCULI 07/16/2007  . Irritable bowel syndrome 09/11/2007  . NEOPLASM, SKIN, UNCERTAIN BEHAVIOR 11/01/8339  . OSTEOARTHRITIS 09/11/2007   oa  . OSTEOPENIA 10/23/2007  . PONV (postoperative nausea and vomiting)   . ROTATOR CUFF REPAIR, HX OF 09/11/2007  . Suicidal ideations   . SYMPTOM, PAIN, ABDOMINAL, RIGHT UP QUADRANT 07/16/2007  . TAH/BSO, HX OF 09/11/2007  . UTI 10/06/2010  . VAGINITIS, ATROPHIC, POSTMENOPAUSAL 07/16/2007    Past Surgical History:  Procedure Laterality Date  . ABDOMINAL HYSTERECTOMY    . APPENDECTOMY    . bone pushed back into  place after fracture Left 25-30 yrs ago   face  . cervical radiculopathy  20 yrs ago   RT  . CHOLECYSTECTOMY N/A 02/18/2014   Procedure: LAPAROSCOPIC CHOLECYSTECTOMY;  Surgeon: Harl Bowie, MD;  Location: [facility];  Service: General;  Laterality: N/A;  . COLONOSCOPY    . DILATION AND CURETTAGE OF UTERUS  age 54  . ESOPHAGOGASTRODUODENOSCOPY    . ORIF ANKLE FRACTURE Left 07/12/2017   Procedure: OPEN REDUCTION INTERNAL FIXATION (ORIF) LEFT ANKLE FRACTURE;  Surgeon: Carole Civil, MD;  Location: [facility];  Service: Orthopedics;  Laterality: Left;  . ROTATOR CUFF REPAIR  right  . SPINAL CORD STIMULATOR IMPLANT  6-7 yrs ago   lower back, pt turned off 2-3 weeks ago due to causing pain  . TOTAL ABDOMINAL HYSTERECTOMY W/ BILATERAL SALPINGOOPHORECTOMY    . TUBAL LIGATION      There were no vitals filed for this visit.  Subjective Assessment - 11/29/17 1634    Subjective  Pt states she is hurting today at 3/10 and reports she accidently took an extra oxycodone before coming to her appt. today.     Currently in Pain?  Yes    Pain Score  3     Pain Location  Foot    Pain Orientation  Left;Medial    Pain Descriptors / Indicators  Sharp;Sore                      [facility] Adult PT Treatment/Exercise - 11/29/17 0001      Manual Therapy    Manual Therapy  Edema management    Manual therapy comments  scar tissue mobs and desensitization      Ankle Exercises: Seated   Towel Crunch  Limitations 2 minutes    Towel Inversion/Eversion  4 reps    Heel Raises  10 reps    Toe Raise  10 reps    BAPS  Sitting;10 reps;Level 3             PT Education - 11/29/17 1707    Education provided  Yes    Education Details  educated on compression stockings for edema control and help with desensitivity.   Measured and given information to order these.      Person(s) Educated  Patient    Methods  Explanation    Comprehension  Verbalized understanding       PT Short Term Goals - 11/12/17 1224      PT SHORT TERM GOAL #1   Title  Patient will demonstrate understanding and report regular compliance of HEP.     Time  3    Period  Weeks    Status  On-going      PT SHORT TERM GOAL #2   Title  Patient will demonstrate improvement in active range of motion of 5 degrees in left ankle plantarflexion, dorsiflexion, inversion and eversion in order to improve patient's mechanics and tolerance with gait.    Baseline  Patient demonstrated -3 degrees of dorsiflexion, 15 degrees of plantarflexion, 10 degrees of inversion, and 8 degrees of eversion at evaluation.; 11/12/2017 - DF largely the same, INV - inc to 12; EVER - inc to 10    Time  3    Period  Weeks    Status  On-going      PT SHORT TERM GOAL #3   Title  Patient will demonstrate ability to ambulate at a velocity of at least 0.8 m/s during 3MWT and ambulating at least 500 feet as evidence of improved gait ambulation.      Baseline  Patient ambulated 452 feet this session at a 0.76 m/s velocity. 11/12/17 - patient continues to have relatively slow ambulation with deficits antaglia, step length, stride length and circumduction of LLE    Time  3    Period  Weeks    Status  On-going      PT SHORT TERM GOAL #4   Title  --        PT Long Term Goals - 11/12/17 1224      PT LONG TERM GOAL  #1  Title  Patient will demonstrate ability to perform 5 times sit to stand in less than 12 seconds as evidence of improvement in ability to perform sit to stands and decreased risk of falls.     Baseline  Patient performed 5 times sit to stand in 14 seconds at evaluation.     Time  6    Period  Weeks    Status  On-going      PT LONG TERM GOAL #2   Title  Patient will demonstrate active dorsiflexion range of motion of the left ankle equal to that of the right ankle, and improvement in left ankle active range of motion in plantarflexion, inversion and eversion of 10 degrees, in order to assist patient with performance of ambulation.      Baseline  Patient demonstrated -3 degrees of dorsiflexion, 15 degrees of plantarflexion, 10 degrees of inversion, and 8 degrees of eversion at evaluation.    Time  6    Period  Weeks    Status  On-going      PT LONG TERM GOAL #3   Title  Patient will demonstrate ability to ambulate at a velocity of 1.0 m/s with 3 MWT without visible circumduction of left lower extremity and without exacerbation of pain as evidence of improved ambulation.    Baseline  Patient ambulated at a velocity of 0.76 m/s with CAM boot during 3 MWT, with visible circumduction of left lower extremity and reported pain.     Time  6    Period  Weeks    Status  On-going      PT LONG TERM GOAL #4   Title  Patient will report ability to ambulate for 30 minutes in order to assist with ambulation through grocery store without use of motorized wheelchair scooter.     Baseline  Patient reported that she used the motorized wheelchair scooter whenever she goes to the store and cannot walk comfortably for more than 20 minutes.     Time  6    Period  Weeks    Status  On-going      PT LONG TERM GOAL #5   Title  Patient will demonstrate improvement of 1 MMT or 5/5 MMT grade in all deficient planes as evidence of improved strength to assist with functional mobility.     Baseline  Patient is  deficient in all planes of strength testing on the left ankle, knee flexion, hip abduction, hip extension, and knee flexion.     Time  6    Period  Weeks    Status  On-going            Plan - 11/29/17 1713    Clinical Impression Statement  Pt returns to therapy today after nearly 2 weeks (last appt on 2/18).  States she conintues to have swelling and hypersensitivy to ankle.  Pt wearing slipper style shoes with noted swelling around ankle and dorsum of foot.  Encouraged purchase of compression stockings to help with sensitivity and swelling issues.  Pt verbalized understanding.  Able to progress to standing exercises without complaints, only cues for  posture.  Also worked on heel to toe gait with Lt LE to decrease antalgia.  Complete dwith manual to encourage reduced swelilng and pain.  Overall improvement in swelling and gait at end of session.      Rehab Potential  Fair    Clinical Impairments Affecting Rehab Potential  Positive: Patient motivation; Negative: comorbidities, severity of pain  PT Frequency  2x / week    PT Duration  6 weeks    PT Treatment/Interventions  ADLs/Self Care Home Management;Cryotherapy;DME Instruction;Gait training;Stair training;Functional mobility training;Therapeutic activities;Therapeutic exercise;Balance training;Neuromuscular re-education;Patient/family education;Orthotic Fit/Training;Manual techniques;Passive range of motion;Compression bandaging;Taping    PT Next Visit Plan  Continue with soft tissue mobilty and technqieus to assist with edema control and desensitization.  F/U wiht compliance with additional HEP and purchase of compression stockings.  Progress therex and improve ankle ROM as able.     PT Home Exercise Plan  Ankle ABC's for Left ankle 1 x 2 times a day; 4 way SLR all directions. 11/12/17 - scar tissue mobs, progressive CAM boot wearing schedule.       Patient will benefit from skilled therapeutic intervention in order to improve the  following deficits and impairments:  Abnormal gait, Decreased balance, Decreased endurance, Decreased mobility, Difficulty walking, Impaired sensation, Decreased range of motion, Increased edema, Improper body mechanics, Decreased activity tolerance, Decreased strength, Pain  Visit Diagnosis: S/P ORIF (open reduction internal fixation) fracture  Other abnormalities of gait and mobility  Decreased range of motion of left ankle  Decreased strength     Problem List Patient Active Problem List   Diagnosis Date Noted  . S/P ORIF (open reduction internal fixation) fracture 07/12/17  07/19/2017  . Trimalleolar fracture of ankle, closed, left, with routine healing, subsequent encounter 07/12/2017  . Closed trimalleolar fracture of left ankle   . Insomnia 02/21/2012  . Routine health maintenance 07/25/2011  . DEPRESSION/ANXIETY 06/17/2010  . Attention deficit disorder 02/02/2009  . Osteopenia 10/23/2007  . CHRONIC PAIN SYNDROME 09/11/2007  . HEMORRHOIDS 09/11/2007  . IRRITABLE BOWEL SYNDROME 09/11/2007  . OSTEOARTHRITIS 09/11/2007   Teena Irani, PTA/CLT [phone]  Teena Irani 11/29/2017, 5:20 PM  [place], [place], [address] Phone: [phone]   Fax:  [phone]  Name: [name] [name] MRN: [id number] Date of Birth: [birth year]

## 2017-11-29 NOTE — Telephone Encounter (Signed)
11/29/17  pt left a message to cx said she has an appt today and tomorrow and doesn't want to come back to back

## 2017-11-29 NOTE — Telephone Encounter (Signed)
10 units approved through [facility] 2/21-3/27/19  Teena Irani, PTA/CLT [phone]

## 2017-11-30 ENCOUNTER — Encounter (HOSPITAL_COMMUNITY): Payer: Medicaid Other | Admitting: Physical Therapy

## 2017-11-30 ENCOUNTER — Telehealth (HOSPITAL_COMMUNITY): Payer: Self-pay | Admitting: Physical Therapy

## 2017-11-30 NOTE — Telephone Encounter (Signed)
Patient is moving next week and needed to cancel she will call us the following week to reschedule

## 2017-12-05 ENCOUNTER — Encounter (HOSPITAL_COMMUNITY): Payer: Medicaid Other | Admitting: Physical Therapy

## 2017-12-24 ENCOUNTER — Telehealth (HOSPITAL_COMMUNITY): Payer: Self-pay | Admitting: Family Medicine

## 2017-12-24 DIAGNOSIS — M84373A Stress fracture, unspecified ankle, initial encounter for fracture: Secondary | ICD-10-CM | POA: Diagnosis not present

## 2017-12-24 NOTE — Telephone Encounter (Signed)
12/24/17  Pt called to make more appts but her approval dates had expired.  I put a note to Otho Ket to see if she could resubmit to get more dates approved.

## 2017-12-26 ENCOUNTER — Telehealth (HOSPITAL_COMMUNITY): Payer: Self-pay | Admitting: Family Medicine

## 2017-12-26 DIAGNOSIS — J029 Acute pharyngitis, unspecified: Secondary | ICD-10-CM | POA: Diagnosis not present

## 2017-12-26 DIAGNOSIS — J111 Influenza due to unidentified influenza virus with other respiratory manifestations: Secondary | ICD-10-CM | POA: Diagnosis not present

## 2017-12-26 DIAGNOSIS — J189 Pneumonia, unspecified organism: Secondary | ICD-10-CM | POA: Diagnosis not present

## 2017-12-26 NOTE — Telephone Encounter (Signed)
12/26/17  Pt had called and wanted to schedule but her visits had run out as far as the date goes.  I [employer] and she responded saying to schedule her as a re-eval visit (per [place]).  I called and left a message with a gentleman who said she was currently at urgent care.

## 2018-01-01 DIAGNOSIS — M150 Primary generalized (osteo)arthritis: Secondary | ICD-10-CM | POA: Insufficient documentation

## 2018-01-01 DIAGNOSIS — M5030 Other cervical disc degeneration, unspecified cervical region: Secondary | ICD-10-CM | POA: Insufficient documentation

## 2018-01-01 DIAGNOSIS — N3946 Mixed incontinence: Secondary | ICD-10-CM | POA: Insufficient documentation

## 2018-01-01 DIAGNOSIS — M159 Polyosteoarthritis, unspecified: Secondary | ICD-10-CM | POA: Insufficient documentation

## 2018-01-01 DIAGNOSIS — R413 Other amnesia: Secondary | ICD-10-CM | POA: Insufficient documentation

## 2018-01-29 ENCOUNTER — Encounter (HOSPITAL_COMMUNITY): Payer: Self-pay | Admitting: Physical Therapy

## 2018-01-29 ENCOUNTER — Ambulatory Visit (HOSPITAL_COMMUNITY): Payer: Medicaid Other | Attending: Orthopedic Surgery | Admitting: Physical Therapy

## 2018-01-29 ENCOUNTER — Other Ambulatory Visit: Payer: Self-pay

## 2018-01-29 DIAGNOSIS — R2689 Other abnormalities of gait and mobility: Secondary | ICD-10-CM

## 2018-01-29 DIAGNOSIS — R531 Weakness: Secondary | ICD-10-CM | POA: Diagnosis present

## 2018-01-29 DIAGNOSIS — Z8781 Personal history of (healed) traumatic fracture: Secondary | ICD-10-CM

## 2018-01-29 DIAGNOSIS — M25672 Stiffness of left ankle, not elsewhere classified: Secondary | ICD-10-CM | POA: Diagnosis present

## 2018-01-29 DIAGNOSIS — Z967 Presence of other bone and tendon implants: Secondary | ICD-10-CM | POA: Diagnosis present

## 2018-01-29 DIAGNOSIS — Z9889 Other specified postprocedural states: Secondary | ICD-10-CM

## 2018-01-29 NOTE — Therapy (Addendum)
[facility] [address] [place], [place], [address] Phone: [phone]   Fax:  [phone]  Physical Therapy Treatment / Re-evaluation  Patient Details  Name: [name] [name] MRN: [id number] Date of Birth: Feb 22, 1956 Referring Provider: Arther Abbott, MD   Encounter Date: 01/29/2018  PT End of Session - 01/29/18 1309    Visit Number  6    Number of Visits  22    Date for PT Re-Evaluation  02/26/18    Authorization Type  Medicaid [place]    Authorization Time Period NEW Requested 3 visits from 02/06/18 for 2 weeks. Certification 9/0/30 - 02/26/18   Authorization - Visit Number  0    Authorization - Number of Visits  3    PT Start Time  0905    PT Stop Time  0945    PT Time Calculation (min)  40 min    Equipment Utilized During Treatment  Other (comment) ACE wrap    Activity Tolerance  Patient limited by pain;Patient tolerated treatment well    Behavior During Therapy  [facility] for tasks assessed/performed       Past Medical History:  Diagnosis Date  . ABDOMINAL PAIN, LOWER 02/16/2009  . APPENDECTOMY, HX OF 09/11/2007  . ATTENTION DEFICIT DISORDER, ADULT 02/02/2009  . BACK PAIN 07/16/2007   chronic  . Back pain    spinal stimulator in place  . CERVICAL RADICULOPATHY, RIGHT 07/16/2007  . CHEST PAIN, ACUTE 09/03/2008  . Chronic abdominal pain   . Chronic pain syndrome 09/11/2007  . Complication of anesthesia    woke up during surgeries few times and woke up during endoscopy and colonscopy few weeks ago, in terrible pain  . DDD (degenerative disc disease), lumbosacral   . DEPRESSION/ANXIETY 06/17/2010  . Dysuria 02/20/2008  . Elevated liver enzymes 1 and  1/2 months ago  . Family history of anesthesia complication    mother woke up during surgeries  . Fibromyalgia   . Functional GI symptoms   . FX, RAMUS NOS, CLOSED 07/16/2007  . GERD (gastroesophageal reflux disease)   . H/O failed conscious sedation    PT STATES SHE IS HARD TO  SEDATE!  . HEMORRHOIDS 09/11/2007  . HIATAL HERNIA, HX OF 09/11/2007  . History of kidney stones   . HX, PERSONAL, MUSCULOSKELETAL DISORD NEC 07/16/2007  . HX, PERSONAL, URINARY CALCULI 07/16/2007  . Irritable bowel syndrome 09/11/2007  . NEOPLASM, SKIN, UNCERTAIN BEHAVIOR 0/05/2329  . OSTEOARTHRITIS 09/11/2007   oa  . OSTEOPENIA 10/23/2007  . PONV (postoperative nausea and vomiting)   . ROTATOR CUFF REPAIR, HX OF 09/11/2007  . Suicidal ideations   . SYMPTOM, PAIN, ABDOMINAL, RIGHT UP QUADRANT 07/16/2007  . TAH/BSO, HX OF 09/11/2007  . UTI 10/06/2010  . VAGINITIS, ATROPHIC, POSTMENOPAUSAL 07/16/2007    Past Surgical History:  Procedure Laterality Date  . ABDOMINAL HYSTERECTOMY    . APPENDECTOMY    . bone pushed back into  place after fracture Left 25-30 yrs ago   face  . cervical radiculopathy  20 yrs ago   RT  . CHOLECYSTECTOMY N/A 02/18/2014   Procedure: LAPAROSCOPIC CHOLECYSTECTOMY;  Surgeon: Harl Bowie, MD;  Location: [facility];  Service: General;  Laterality: N/A;  . COLONOSCOPY    . DILATION AND CURETTAGE OF UTERUS  age 49  . ESOPHAGOGASTRODUODENOSCOPY    . ORIF ANKLE FRACTURE Left 07/12/2017   Procedure: OPEN REDUCTION INTERNAL FIXATION (ORIF) LEFT ANKLE FRACTURE;  Surgeon: Carole Civil, MD;  Location: [facility];  Service: Orthopedics;  Laterality: Left;  . ROTATOR CUFF REPAIR     right  . SPINAL CORD STIMULATOR IMPLANT  6-7 yrs ago   lower back, pt turned off 2-3 weeks ago due to causing pain  . TOTAL ABDOMINAL HYSTERECTOMY W/ BILATERAL SALPINGOOPHORECTOMY    . TUBAL LIGATION      There were no vitals filed for this visit.  Subjective Assessment - 01/29/18 0917    Subjective  Pt states that she feels like therapy was helping before, but that she had to help her daughter with some stuff and she was moving and that's why she was unable to come to therapy. She stated she has not been wearing the boot anymore and it is not required. She stated she wears an ACE  wrap just for some support. She reported pain today that is a 3/10.    Pertinent History  S/p ORIF for trimalleolar fracture of left ankle October 2018    Limitations  Lifting;Standing;Walking;House hold activities    How long can you sit comfortably?  Not limited    How long can you stand comfortably?  20 minutes    How long can you walk comfortably?  10 minutes    Diagnostic tests  X-ray: Last x-ray indicated stable fixation of Left ankle without hardware complications    Patient Stated Goals  To have less pain and have less pain     Currently in Pain?  Yes    Pain Score  5     Pain Location  Foot    Pain Orientation  Left;Medial    Pain Descriptors / Indicators  Sore    Pain Type  Chronic pain    Pain Radiating Towards  Radiates up to left knee    Pain Onset  More than a month ago    Pain Frequency  Intermittent    Aggravating Factors   Walking and standing    Pain Relieving Factors  Medication; elevating it    Effect of Pain on Daily Activities  Severely limited    Multiple Pain Sites  No         [facility] PT Assessment - 01/29/18 0001      Assessment   Medical Diagnosis  S/p ankle fracture ORIF    Referring Provider  Arther Abbott, MD    Onset Date/Surgical Date  07/04/17    Next MD Visit  -- October 2019 Patient thinks    Prior Therapy  For back pain, but not for ankle      Precautions   Other Brace/Splint  Wears ACE bandage      Restrictions   Weight Bearing Restrictions  No      Balance Screen   Has the patient fallen in the past 6 months  Yes    How many times?  1    Has the patient had a decrease in activity level because of a fear of falling?   Yes    Is the patient reluctant to leave their home because of a fear of falling?   Yes      [place] residence    Living Arrangements  Spouse/significant other    Home Access  Stairs to enter    Entrance Stairs-Number of Steps  [place]  One level With basement 4 steps     [place] - 2 wheels;Bedside commode;Wheelchair - manual      Prior Function  Level of Independence  Independent;Independent with basic ADLs      Cognition   Overall Cognitive Status  Within Functional Limits for tasks assessed      Observation/Other Assessments   Observations  patients left ankle and lower shin are of purplish hue and shiny    Skin Integrity  3 surgical incisions are closed however medial incision appears to have an adhesion just posterior to medial malleolus      Sensation   Light Touch  Impaired by gross assessment      AROM   Right Ankle Dorsiflexion  2    Right Ankle Plantar Flexion  60    Right Ankle Inversion  39    Right Ankle Eversion  30    Left Ankle Dorsiflexion  -5    Left Ankle Plantar Flexion  30    Left Ankle Inversion  10    Left Ankle Eversion  15      Strength   Right Hip Flexion  4+/5    Right Hip Extension  4/5    Right Hip ABduction  4/5    Left Hip Flexion  4+/5    Left Hip Extension  4/5    Left Hip ABduction  4+/5    Right Knee Flexion  4+/5    Right Knee Extension  5/5    Left Knee Flexion  4+/5    Left Knee Extension  5/5    Right Ankle Dorsiflexion  5/5    Right Ankle Plantar Flexion  5/5    Right Ankle Inversion  5/5    Right Ankle Eversion  5/5    Left Ankle Dorsiflexion  4+/5    Left Ankle Plantar Flexion  2+/5    Left Ankle Inversion  4+/5    Left Ankle Eversion  4+/5      Palpation   Palpation comment  Patient reported tenderness to palpation all around left ankle and foot. Hypersensitivity to light touch.       Ambulation/Gait   Ambulation/Gait  Yes    Ambulation/Gait Assistance  7: Independent    Ambulation Distance (Feet)  509 Feet    Assistive device  None    Gait Pattern  Step-through pattern;Decreased step length - right;Decreased stance time - left;Decreased stride length;Decreased weight shift to left;Left circumduction    Ambulation Surface  Level    Gait velocity  0.86 m/s    Stairs   Yes    Stairs Assistance  5: Supervision    Stair Management Technique  One rail Right;Step to pattern;Alternating pattern;Other (comment) Intermittent step-to and alternating pattern with descent    Number of Stairs  12    Height of Stairs  6 inches      Balance   Balance Assessed  Yes      Static Standing Balance   Static Standing - Balance Support  No upper extremity supported    Static Standing - Level of Assistance  7: Independent    Static Standing Balance -  Activities   Single Leg Stance - Right Leg;Single Leg Stance - Left Leg Solid and foam    Static Standing - Comment/# of Minutes  SOLID SLS: Rt. 53.58 seconds, Lt. 8 seconds. FOAM SLS: Rt. 31.22 seconds, Lt. 5.23 seconds      Standardized Balance Assessment   Five times sit to stand comments   Patient performed 5 sit to stands in 10 seconds  PT Education - 01/29/18 1308    Education provided  Yes    Education Details  Educated patient on findings or re-assessment and plan of care.     Person(s) Educated  Patient    Methods  Explanation    Comprehension  Verbalized understanding       PT Short Term Goals - 01/29/18 1322      PT SHORT TERM GOAL #1   Title  Patient will demonstrate understanding and report regular compliance of HEP.     Time  2    Period  Weeks    Status  On-going      PT SHORT TERM GOAL #2   Title  Patient will demonstrate improvement in active range of motion of 5 degrees in left ankle plantarflexion, dorsiflexion, inversion and eversion in order to improve patient's mechanics and tolerance with gait.    Baseline  --    Time  2    Period  Weeks    Status  On-going      PT SHORT TERM GOAL #3   Title  Patient will demonstrate ability to ambulate at a velocity of at least 0.8 m/s during 3MWT and ambulating at least 500 feet as evidence of improved gait ambulation.      Baseline  01/29/18: Patient ambulated 509 feet at a velocity of 0.86 m/s    Time  3     Period  Weeks    Status  Achieved        PT Long Term Goals - 01/29/18 1326      PT LONG TERM GOAL #1   Title  Patient will demonstrate ability to perform 5 times sit to stand in less than 12 seconds as evidence of improvement in ability to perform sit to stands and decreased risk of falls.     Baseline  01/29/18: Patient performed 5xSTS in 10 seconds.     Time  4    Period  Weeks    Status  Achieved    Target Date  02/26/18      PT LONG TERM GOAL #2   Title  Patient will demonstrate ability to maintain single limb stance on each lower extremity on solid and foam for 10 seconds or greater indicating improved balance and decreased risk of falls.     Baseline  Old goal: Patient will demonstrate active dorsiflexion range of motion of the left ankle equal to that of the right ankle, and improvement in left ankle active range of motion in plantarflexion, inversion and eversion of 10 degrees, in order to assist patient with performance of ambulation.      Time  4    Period  Weeks    Status  Revised    Target Date  02/26/18      PT LONG TERM GOAL #3   Title  Patient will demonstrate ability to ambulate at a velocity of 1.0 m/s with 3 MWT without visible circumduction of left lower extremity and without exacerbation of pain as evidence of improved ambulation.    Baseline  12/30/17: Patient ambulated at a velocity of 0.86 m/s.     Time  4    Period  Weeks    Status  On-going    Target Date  02/26/18      PT LONG TERM GOAL #4   Title  Patient will report ability to ambulate for 30 minutes in order to assist with ambulation through grocery store without use of motorized wheelchair scooter.  Baseline  01/29/18: Patient reported that she used the motorized wheelchair scooter whenever she goes to the store and cannot walk comfortably for more than 10 minutes.     Time  4    Period  Weeks    Status  On-going    Target Date  02/26/18      PT LONG TERM GOAL #5   Title  Patient will  demonstrate improvement of 1 MMT or 5/5 MMT grade in all deficient planes as evidence of improved strength to assist with functional mobility.     Baseline  --    Time  4    Period  Weeks    Status  On-going    Target Date  02/26/18            Plan - 01/29/18 1338    Clinical Impression Statement  Patient returned to physical therapy today after about 2 months. Therapist performed a re-evaluation of patient's progress toward goals. Patient has achieved 1 short term goal of ambulating at a speed of at least 0.8 m/s. Patient has achieved 1 long term goal of performing the 5xSTS in less than 12 seconds. One goal was revised to address patient's single limb stance balance deficits. Patient continued to demonstrate deficits in strength, ROM, balance, and overall functional mobility. Patient demonstrated some hypersensitivity with palpation of left foot and ankle. Patient would benefit from continued skilled physical therapy in order to continue addressing the abovementioned deficits and help patient reach goals.     History and Personal Factors relevant to plan of care:  s/p ORIF left ankle fracture 07/04/17; chronic back pain with spinal stimulator, fibromyalgia, OA    Clinical Presentation  Stable    Clinical Presentation due to:  MMT, ROM, 3 MWT, 5x STS, clinical judgement    Clinical Decision Making  Low    Rehab Potential  Fair    Clinical Impairments Affecting Rehab Potential  Positive: Patient motivation; Negative: comorbidities, severity of pain    PT Frequency  2x / week    PT Duration  4 weeks 4 additional weeks    PT Treatment/Interventions  ADLs/Self Care Home Management;Cryotherapy;DME Instruction;Gait training;Stair training;Functional mobility training;Therapeutic activities;Therapeutic exercise;Balance training;Neuromuscular re-education;Patient/family education;Orthotic Fit/Training;Manual techniques;Passive range of motion;Compression bandaging;Taping    PT Next Visit Plan   Review new goals. Continue with soft tissue mobilty and technqieus to assist with edema control and desensitization.  F/U with which exercises patient has still been doing and update HEP.  Progress therex and improve ankle ROM as able.     PT Home Exercise Plan  Ankle ABC's for Left ankle 1 x 2 times a day; 4 way SLR all directions. 11/12/17 - scar tissue mobs, progressive CAM boot wearing schedule.    Consulted and Agree with Plan of Care  Patient       Patient will benefit from skilled therapeutic intervention in order to improve the following deficits and impairments:  Abnormal gait, Decreased balance, Decreased endurance, Decreased mobility, Difficulty walking, Impaired sensation, Decreased range of motion, Increased edema, Improper body mechanics, Decreased activity tolerance, Decreased strength, Pain  Visit Diagnosis: S/P ORIF (open reduction internal fixation) fracture  Other abnormalities of gait and mobility  Decreased range of motion of left ankle  Decreased strength     Problem List Patient Active Problem List   Diagnosis Date Noted  . S/P ORIF (open reduction internal fixation) fracture 07/12/17  07/19/2017  . Trimalleolar fracture of ankle, closed, left, with routine healing, subsequent encounter 07/12/2017  .  Closed trimalleolar fracture of left ankle   . Insomnia 02/21/2012  . Routine health maintenance 07/25/2011  . DEPRESSION/ANXIETY 06/17/2010  . Attention deficit disorder 02/02/2009  . Osteopenia 10/23/2007  . CHRONIC PAIN SYNDROME 09/11/2007  . HEMORRHOIDS 09/11/2007  . IRRITABLE BOWEL SYNDROME 09/11/2007  . OSTEOARTHRITIS 09/11/2007   Clarene Critchley PT, DPT 1:45 PM, 01/29/18 [place], [place], [address] Phone: [phone]   Fax:  [phone]  Name: Merleen Picazo MRN: [id number] Date of Birth: [birth year]

## 2018-02-13 ENCOUNTER — Ambulatory Visit (HOSPITAL_COMMUNITY): Payer: Medicaid Other | Admitting: Physical Therapy

## 2018-02-13 ENCOUNTER — Encounter (HOSPITAL_COMMUNITY): Payer: Self-pay | Admitting: Physical Therapy

## 2018-02-13 DIAGNOSIS — R531 Weakness: Secondary | ICD-10-CM

## 2018-02-13 DIAGNOSIS — M25672 Stiffness of left ankle, not elsewhere classified: Secondary | ICD-10-CM

## 2018-02-13 DIAGNOSIS — Z8781 Personal history of (healed) traumatic fracture: Secondary | ICD-10-CM

## 2018-02-13 DIAGNOSIS — Z9889 Other specified postprocedural states: Secondary | ICD-10-CM

## 2018-02-13 DIAGNOSIS — R2689 Other abnormalities of gait and mobility: Secondary | ICD-10-CM

## 2018-02-13 DIAGNOSIS — Z967 Presence of other bone and tendon implants: Secondary | ICD-10-CM | POA: Diagnosis not present

## 2018-02-13 NOTE — Therapy (Signed)
[facility] [address] [place], [place], [address] Phone: [phone]   Fax:  [phone]  Physical Therapy Treatment  Patient Details  Name: [name] [name] MRN: [id number] Date of Birth: 11/23/1955 Referring Provider: Arther Abbott, MD   Encounter Date: 02/13/2018  PT End of Session - 02/13/18 1212    Visit Number  7    Number of Visits  22    Date for PT Re-Evaluation  02/26/18    Authorization Type  Medicaid [place]    Authorization Time Period  NEW approved 3 visits from 02/06/2018 - 02/19/2018. Certification 09/30/15 - 02/26/18    Authorization - Visit Number  1    Authorization - Number of Visits  3    PT Start Time  1120 Patient arrived late    PT Stop Time  1200    PT Time Calculation (min)  40 min    Equipment Utilized During Treatment  Other (comment) ACE wrap    Activity Tolerance  Patient limited by pain;Patient tolerated treatment well    Behavior During Therapy  [facility] for tasks assessed/performed       Past Medical History:  Diagnosis Date  . ABDOMINAL PAIN, LOWER 02/16/2009  . APPENDECTOMY, HX OF 09/11/2007  . ATTENTION DEFICIT DISORDER, ADULT 02/02/2009  . BACK PAIN 07/16/2007   chronic  . Back pain    spinal stimulator in place  . CERVICAL RADICULOPATHY, RIGHT 07/16/2007  . CHEST PAIN, ACUTE 09/03/2008  . Chronic abdominal pain   . Chronic pain syndrome 09/11/2007  . Complication of anesthesia    woke up during surgeries few times and woke up during endoscopy and colonscopy few weeks ago, in terrible pain  . DDD (degenerative disc disease), lumbosacral   . DEPRESSION/ANXIETY 06/17/2010  . Dysuria 02/20/2008  . Elevated liver enzymes 1 and  1/2 months ago  . Family history of anesthesia complication    mother woke up during surgeries  . Fibromyalgia   . Functional GI symptoms   . FX, RAMUS NOS, CLOSED 07/16/2007  . GERD (gastroesophageal reflux disease)   . H/O failed conscious sedation    PT STATES SHE IS  HARD TO SEDATE!  . HEMORRHOIDS 09/11/2007  . HIATAL HERNIA, HX OF 09/11/2007  . History of kidney stones   . HX, PERSONAL, MUSCULOSKELETAL DISORD NEC 07/16/2007  . HX, PERSONAL, URINARY CALCULI 07/16/2007  . Irritable bowel syndrome 09/11/2007  . NEOPLASM, SKIN, UNCERTAIN BEHAVIOR 02/24/6072  . OSTEOARTHRITIS 09/11/2007   oa  . OSTEOPENIA 10/23/2007  . PONV (postoperative nausea and vomiting)   . ROTATOR CUFF REPAIR, HX OF 09/11/2007  . Suicidal ideations   . SYMPTOM, PAIN, ABDOMINAL, RIGHT UP QUADRANT 07/16/2007  . TAH/BSO, HX OF 09/11/2007  . UTI 10/06/2010  . VAGINITIS, ATROPHIC, POSTMENOPAUSAL 07/16/2007    Past Surgical History:  Procedure Laterality Date  . ABDOMINAL HYSTERECTOMY    . APPENDECTOMY    . bone pushed back into  place after fracture Left 25-30 yrs ago   face  . cervical radiculopathy  20 yrs ago   RT  . CHOLECYSTECTOMY N/A 02/18/2014   Procedure: LAPAROSCOPIC CHOLECYSTECTOMY;  Surgeon: Harl Bowie, MD;  Location: [facility];  Service: General;  Laterality: N/A;  . COLONOSCOPY    . DILATION AND CURETTAGE OF UTERUS  age 56  . ESOPHAGOGASTRODUODENOSCOPY    . ORIF ANKLE FRACTURE Left 07/12/2017   Procedure: OPEN REDUCTION INTERNAL FIXATION (ORIF) LEFT ANKLE FRACTURE;  Surgeon: Carole Civil, MD;  Location:  [facility];  Service: Orthopedics;  Laterality: Left;  . ROTATOR CUFF REPAIR     right  . SPINAL CORD STIMULATOR IMPLANT  6-7 yrs ago   lower back, pt turned off 2-3 weeks ago due to causing pain  . TOTAL ABDOMINAL HYSTERECTOMY W/ BILATERAL SALPINGOOPHORECTOMY    . TUBAL LIGATION      There were no vitals filed for this visit.  Subjective Assessment - 02/13/18 1127    Subjective  Patient reported that she feels like the new shoes she got have increased her foot/ankle pain. Patient reported that she does the ABC exercise at home.     Pertinent History  S/p ORIF for trimalleolar fracture of left ankle October 2018    Limitations   Lifting;Standing;Walking;House hold activities    How long can you sit comfortably?  Not limited    How long can you stand comfortably?  20 minutes    How long can you walk comfortably?  10 minutes    Diagnostic tests  X-ray: Last x-ray indicated stable fixation of Left ankle without hardware complications    Patient Stated Goals  To have less pain and have less pain     Currently in Pain?  Yes    Pain Score  5     Pain Location  Ankle and heel    Pain Orientation  Left    Pain Descriptors / Indicators  Sore    Pain Type  Chronic pain    Pain Onset  More than a month ago    Multiple Pain Sites  No See above                       [facility] Adult PT Treatment/Exercise - 02/13/18 0001      Knee/Hip Exercises: Supine   Straight Leg Raises  Strengthening;Right;Left;1 set;15 reps;Other (comment) 2# ankle weight. Review for HEP      Knee/Hip Exercises: Sidelying   Hip ABduction  Strengthening;Right;Left;1 set;15 reps;Other (comment) 2# ankle weights. Review for HEP      Knee/Hip Exercises: Prone   Straight Leg Raises  Strengthening;Right;Left;1 set;15 reps Review for HEP      Ankle Exercises: Seated   ABC's  1 rep Review for HEP. Left ankle    Heel Raises  Left;Other (comment) 2x15 repetitions with 2# ankle weight    BAPS  Sitting;Level 3;15 reps Left ankle    Other Seated Ankle Exercises  Ankle inversion, eversion and dorsiflexion strengthening with left ankle 2 x 15 repetitions with Red theraband      Ankle Exercises: Standing   BAPS  Standing;Level 3;15 reps;Other (comment) DF/PF, Lt./Rt., CCW rotations. Rest breaks as needed.              PT Education - 02/13/18 1211    Education provided  Yes    Education Details  Patient was educated about checking her feet for skin breakdown everyday as she reported numbness and reported that she has gotten a new pair of shoes. Patient was also educated on continuing her home exercises and on the purpose and technique of  exercises throughout the session.     Person(s) Educated  Patient    Methods  Explanation;Verbal cues;Tactile cues;Demonstration    Comprehension  Verbalized understanding;Returned demonstration;Verbal cues required;Tactile cues required       PT Short Term Goals - 01/29/18 1322      PT SHORT TERM GOAL #1   Title  Patient will demonstrate understanding and report regular compliance  of HEP.     Time  2    Period  Weeks    Status  On-going      PT SHORT TERM GOAL #2   Title  Patient will demonstrate improvement in active range of motion of 5 degrees in left ankle plantarflexion, dorsiflexion, inversion and eversion in order to improve patient's mechanics and tolerance with gait.    Baseline  --    Time  2    Period  Weeks    Status  On-going      PT SHORT TERM GOAL #3   Title  Patient will demonstrate ability to ambulate at a velocity of at least 0.8 m/s during 3MWT and ambulating at least 500 feet as evidence of improved gait ambulation.      Baseline  01/29/18: Patient ambulated 509 feet at a velocity of 0.86 m/s    Time  3    Period  Weeks    Status  Achieved        PT Long Term Goals - 01/29/18 1326      PT LONG TERM GOAL #1   Title  Patient will demonstrate ability to perform 5 times sit to stand in less than 12 seconds as evidence of improvement in ability to perform sit to stands and decreased risk of falls.     Baseline  01/29/18: Patient performed 5xSTS in 10 seconds.     Time  4    Period  Weeks    Status  Achieved    Target Date  02/26/18      PT LONG TERM GOAL #2   Title  Patient will demonstrate ability to maintain single limb stance on each lower extremity on solid and foam for 10 seconds or greater indicating improved balance and decreased risk of falls.     Baseline  Old goal: Patient will demonstrate active dorsiflexion range of motion of the left ankle equal to that of the right ankle, and improvement in left ankle active range of motion in plantarflexion,  inversion and eversion of 10 degrees, in order to assist patient with performance of ambulation.      Time  4    Period  Weeks    Status  Revised    Target Date  02/26/18      PT LONG TERM GOAL #3   Title  Patient will demonstrate ability to ambulate at a velocity of 1.0 m/s with 3 MWT without visible circumduction of left lower extremity and without exacerbation of pain as evidence of improved ambulation.    Baseline  12/30/17: Patient ambulated at a velocity of 0.86 m/s.     Time  4    Period  Weeks    Status  On-going    Target Date  02/26/18      PT LONG TERM GOAL #4   Title  Patient will report ability to ambulate for 30 minutes in order to assist with ambulation through grocery store without use of motorized wheelchair scooter.     Baseline  01/29/18: Patient reported that she used the motorized wheelchair scooter whenever she goes to the store and cannot walk comfortably for more than 10 minutes.     Time  4    Period  Weeks    Status  On-going    Target Date  02/26/18      PT LONG TERM GOAL #5   Title  Patient will demonstrate improvement of 1 MMT or 5/5 MMT grade in all deficient  planes as evidence of improved strength to assist with functional mobility.     Baseline  --    Time  4    Period  Weeks    Status  On-going    Target Date  02/26/18            Plan - 02/13/18 1215    Clinical Impression Statement  This session began by briefly reviewing patient's goals. Then patient began session by performing home exercises including ABC's with left ankle and 4-way hip strengthening. This session patient performed hip strengthening in bilateral lower extremities in order to improve patient's strength and improve form with gait mechanics bilaterally. This session added ankle strengthening with red theraband. This session also added seated heel raises with 2# ankle weights to improve dorsiflexion strengthening. Patient required several therapeutic rest breaks due to pain, however  patient was able to complete exercises following breaks. Patient would benefit from continued skilled physical therapy in order to continue progress toward goals.     Rehab Potential  Fair    Clinical Impairments Affecting Rehab Potential  Positive: Patient motivation; Negative: comorbidities, severity of pain    PT Frequency  2x / week    PT Duration  4 weeks 4 additional weeks    PT Treatment/Interventions  ADLs/Self Care Home Management;Cryotherapy;DME Instruction;Gait training;Stair training;Functional mobility training;Therapeutic activities;Therapeutic exercise;Balance training;Neuromuscular re-education;Patient/family education;Orthotic Fit/Training;Manual techniques;Passive range of motion;Compression bandaging;Taping    PT Next Visit Plan  Continue progression of ankle mobility exercises including BAPS board, continue strengthening exercises with red theraband, progress to standing plantarflexion when able, begin balance and SLS activities next session and compliant surface. Lower extremity functional strengthening with a focus on exercises that will also improve ankle mobility.    PT Home Exercise Plan  Ankle ABC's for Left ankle 1 x 2 times a day; 4 way SLR all directions. 11/12/17 - scar tissue mobs, progressive CAM boot wearing schedule.    Consulted and Agree with Plan of Care  Patient       Patient will benefit from skilled therapeutic intervention in order to improve the following deficits and impairments:  Abnormal gait, Decreased balance, Decreased endurance, Decreased mobility, Difficulty walking, Impaired sensation, Decreased range of motion, Increased edema, Improper body mechanics, Decreased activity tolerance, Decreased strength, Pain  Visit Diagnosis: S/P ORIF (open reduction internal fixation) fracture  Other abnormalities of gait and mobility  Decreased range of motion of left ankle  Decreased strength     Problem List Patient Active Problem List   Diagnosis Date  Noted  . S/P ORIF (open reduction internal fixation) fracture 07/12/17  07/19/2017  . Trimalleolar fracture of ankle, closed, left, with routine healing, subsequent encounter 07/12/2017  . Closed trimalleolar fracture of left ankle   . Insomnia 02/21/2012  . Routine health maintenance 07/25/2011  . DEPRESSION/ANXIETY 06/17/2010  . Attention deficit disorder 02/02/2009  . Osteopenia 10/23/2007  . CHRONIC PAIN SYNDROME 09/11/2007  . HEMORRHOIDS 09/11/2007  . IRRITABLE BOWEL SYNDROME 09/11/2007  . OSTEOARTHRITIS 09/11/2007   Clarene Critchley PT, DPT 12:25 PM, 02/13/18 [place], [place], [address] Phone: [phone]   Fax:  [phone]  Name: [name] [name] MRN: [id number] Date of Birth: [birth year]

## 2018-02-14 ENCOUNTER — Telehealth (HOSPITAL_COMMUNITY): Payer: Self-pay

## 2018-02-14 NOTE — Telephone Encounter (Signed)
HER SISITER IS IN ACUTE CARE AND SHE CAN NOT COME TOMORROW

## 2018-02-15 ENCOUNTER — Ambulatory Visit (HOSPITAL_COMMUNITY): Payer: Medicaid Other

## 2018-02-19 ENCOUNTER — Encounter (HOSPITAL_COMMUNITY): Payer: Self-pay | Admitting: Physical Therapy

## 2018-02-19 ENCOUNTER — Ambulatory Visit (HOSPITAL_COMMUNITY): Payer: Medicaid Other | Admitting: Physical Therapy

## 2018-02-19 DIAGNOSIS — R531 Weakness: Secondary | ICD-10-CM

## 2018-02-19 DIAGNOSIS — Z967 Presence of other bone and tendon implants: Secondary | ICD-10-CM | POA: Diagnosis not present

## 2018-02-19 DIAGNOSIS — Z8781 Personal history of (healed) traumatic fracture: Secondary | ICD-10-CM

## 2018-02-19 DIAGNOSIS — R2689 Other abnormalities of gait and mobility: Secondary | ICD-10-CM

## 2018-02-19 DIAGNOSIS — M25672 Stiffness of left ankle, not elsewhere classified: Secondary | ICD-10-CM

## 2018-02-19 DIAGNOSIS — Z9889 Other specified postprocedural states: Secondary | ICD-10-CM

## 2018-02-19 NOTE — Patient Instructions (Signed)
  BRIDGING While lying on your back with knees bent, tighten your lower abdominals, squeeze your buttocks and then raise your buttocks off the floor/bed as creating a "Bridge" with your body. Hold and then lower yourself and repeat. Repeat 10 Times Hold 1 Second Complete 2 Sets Perform 3 Time(s) a Week   SINGLE LEG STANCE - SLS Stand on one leg and maintain your balance. x 10 on each lower extremity. Holding for up to a minute  Repeat 5 Times Complete 1 Set Perform 1 Time(s) a Day   ELASTIC BAND PLANTARFLEXION - SEATED While seated, use an elastic band attached to your foot and press your foot downward and forward. Be sure to keep your heel in contact with the floor the entire time. Left foot Repeat 10 Times Hold 1 Second Complete 2 Sets Perform 3 Time(s) a Week    ELASTIC BAND EVERSION - SEATED Using an elastic band attached to your foot, hook it under your opposite foot and up to your hand. Next, draw the band outwards to the side. Be sure to keep your heel in contact with the floor the entire time. Left ankle  Repeat 10 Times Hold 1 Second Complete 2 Sets Perform 3 Time(s) a Week    ELASTIC BAND INVERSION - SEATED While seated, cross your legs and using an elastic band attached to your foot, hook it under your opposite foot and up to your hand. Next, draw your foot inward. Be sure to keep your heel in contact with the floor the entire time.  Repeat 10 Times Hold 1 Second Complete 2 Sets Perform 3 Time(s) a Week

## 2018-02-19 NOTE — Therapy (Signed)
[facility] [address] [place], [place], [address] Phone: [phone]   Fax:  [phone]  Physical Therapy Treatment / Re-assessment / Discharge Summary  Patient Details  Name: [name] [name] MRN: [id number] Date of Birth: 1955/12/26 Referring Provider: Arther Abbott, MD   Encounter Date: 02/19/2018  PT End of Session - 02/19/18 1045    Visit Number  8    Number of Visits  22    Date for PT Re-Evaluation  02/26/18    Authorization Type  Medicaid [place] [facility]    Authorization Time Period  NEW approved 3 visits from 02/06/2018 - 02/19/2018. Certification 5/0/53 - 02/26/18    Authorization - Visit Number  2    Authorization - Number of Visits  3    PT Start Time  [phone] Patient arrived late    PT Stop Time  3419    PT Time Calculation (min)  36 min    Equipment Utilized During Treatment  Other (comment) Ankle sleeve    Activity Tolerance  Patient limited by pain;Patient tolerated treatment well    Behavior During Therapy  [facility] for tasks assessed/performed       Past Medical History:  Diagnosis Date  . ABDOMINAL PAIN, LOWER 02/16/2009  . APPENDECTOMY, HX OF 09/11/2007  . ATTENTION DEFICIT DISORDER, ADULT 02/02/2009  . BACK PAIN 07/16/2007   chronic  . Back pain    spinal stimulator in place  . CERVICAL RADICULOPATHY, RIGHT 07/16/2007  . CHEST PAIN, ACUTE 09/03/2008  . Chronic abdominal pain   . Chronic pain syndrome 09/11/2007  . Complication of anesthesia    woke up during surgeries few times and woke up during endoscopy and colonscopy few weeks ago, in terrible pain  . DDD (degenerative disc disease), lumbosacral   . DEPRESSION/ANXIETY 06/17/2010  . Dysuria 02/20/2008  . Elevated liver enzymes 1 and  1/2 months ago  . Family history of anesthesia complication    mother woke up during surgeries  . Fibromyalgia   . Functional GI symptoms   . FX, RAMUS NOS, CLOSED 07/16/2007  . GERD (gastroesophageal reflux disease)   . H/O failed  conscious sedation    PT STATES SHE IS HARD TO SEDATE!  . HEMORRHOIDS 09/11/2007  . HIATAL HERNIA, HX OF 09/11/2007  . History of kidney stones   . HX, PERSONAL, MUSCULOSKELETAL DISORD NEC 07/16/2007  . HX, PERSONAL, URINARY CALCULI 07/16/2007  . Irritable bowel syndrome 09/11/2007  . NEOPLASM, SKIN, UNCERTAIN BEHAVIOR 11/30/9022  . OSTEOARTHRITIS 09/11/2007   oa  . OSTEOPENIA 10/23/2007  . PONV (postoperative nausea and vomiting)   . ROTATOR CUFF REPAIR, HX OF 09/11/2007  . Suicidal ideations   . SYMPTOM, PAIN, ABDOMINAL, RIGHT UP QUADRANT 07/16/2007  . TAH/BSO, HX OF 09/11/2007  . UTI 10/06/2010  . VAGINITIS, ATROPHIC, POSTMENOPAUSAL 07/16/2007    Past Surgical History:  Procedure Laterality Date  . ABDOMINAL HYSTERECTOMY    . APPENDECTOMY    . bone pushed back into  place after fracture Left 25-30 yrs ago   face  . cervical radiculopathy  20 yrs ago   RT  . CHOLECYSTECTOMY N/A 02/18/2014   Procedure: LAPAROSCOPIC CHOLECYSTECTOMY;  Surgeon: Harl Bowie, MD;  Location: [facility];  Service: General;  Laterality: N/A;  . COLONOSCOPY    . DILATION AND CURETTAGE OF UTERUS  age 53  . ESOPHAGOGASTRODUODENOSCOPY    . ORIF ANKLE FRACTURE Left 07/12/2017   Procedure: OPEN REDUCTION INTERNAL FIXATION (ORIF) LEFT ANKLE FRACTURE;  Surgeon: Aline Brochure,  Tim Lair, MD;  Location: [facility];  Service: Orthopedics;  Laterality: Left;  . ROTATOR CUFF REPAIR     right  . SPINAL CORD STIMULATOR IMPLANT  6-7 yrs ago   lower back, pt turned off 2-3 weeks ago due to causing pain  . TOTAL ABDOMINAL HYSTERECTOMY W/ BILATERAL SALPINGOOPHORECTOMY    . TUBAL LIGATION      There were no vitals filed for this visit.  Subjective Assessment - 02/19/18 0955    Subjective  Patient stated she had been doing her exercises, she stated that she is feeling like she is walking better and is ready to be discharged. Patient stated she was able to walk around the hospital without an issue.     Pertinent History  S/p  ORIF for trimalleolar fracture of left ankle October 2018    Limitations  Lifting;Standing;Walking;House hold activities    How long can you sit comfortably?  Not limited    How long can you stand comfortably?  30 minutes (was 20)    How long can you walk comfortably?  30 minutes (was 10)    Diagnostic tests  X-ray: Last x-ray indicated stable fixation of Left ankle without hardware complications    Patient Stated Goals  To have less pain and have less pain     Currently in Pain?  Yes    Pain Score  7     Pain Location  Ankle    Pain Orientation  Left    Pain Descriptors / Indicators  Sore;Tightness    Pain Type  Chronic pain    Pain Onset  More than a month ago    Aggravating Factors   walking and standing    Pain Relieving Factors  medication; elevating it     Effect of Pain on Daily Activities  minimally limited    Multiple Pain Sites  No         [facility] PT Assessment - 02/19/18 0001      Assessment   Medical Diagnosis  S/p ankle fracture ORIF    Referring Provider  Arther Abbott, MD    Onset Date/Surgical Date  07/04/17      Precautions   Other Brace/Splint  Ankle sleeve      Restrictions   Weight Bearing Restrictions  No      Home Environment   Living Environment  Private residence    Living Arrangements  Spouse/significant other    [place] to enter    Entrance Stairs-Number of Steps  [place]  One level with basement 4 steps    [place] - 2 wheels;Bedside commode;Wheelchair - manual      Prior Function   Level of Independence  Independent;Independent with basic ADLs      Cognition   Overall Cognitive Status  Within Functional Limits for tasks assessed      Observation/Other Assessments   Observations  patients left ankle and lower shin are of purplish hue and shiny      Sensation   Light Touch  Impaired by gross assessment      AROM   Right Ankle Dorsiflexion  7 was 2    Right Ankle Plantar Flexion  61 was 60    Right  Ankle Inversion  40 was 39    Right Ankle Eversion  30 was 30    Left Ankle Dorsiflexion  0 was -5    Left Ankle Plantar Flexion  35 was 30  Left Ankle Inversion  20 was 10    Left Ankle Eversion  21 was 15      Strength   Right Hip Flexion  5/5 was 4+/5    Right Hip Extension  4+/5 was 4/5    Right Hip ABduction  5/5 was 4/5    Left Hip Flexion  5/5 was 4+/5    Left Hip Extension  4+/5 was 4/5    Left Hip ABduction  5/5 was 4+/5    Right Knee Flexion  5/5 was 4+/5    Right Knee Extension  5/5    Left Knee Flexion  5/5 was 4+/5    Left Knee Extension  5/5    Right Ankle Dorsiflexion  5/5    Right Ankle Plantar Flexion  5/5    Right Ankle Inversion  5/5    Right Ankle Eversion  5/5    Left Ankle Dorsiflexion  5/5 was 4+/5    Left Ankle Plantar Flexion  3+/5 was 2+/5    Left Ankle Inversion  5/5 was 4+/5    Left Ankle Eversion  5/5 was 4+/5      Palpation   Palpation comment  Patient reported tenderness to palpation all around left ankle and foot. Hypersensitivity to light touch.       Ambulation/Gait   Ambulation/Gait  Yes    Ambulation/Gait Assistance  7: Independent    Ambulation Distance (Feet)  776 Feet 3MWT    Assistive device  None    Gait Pattern  Step-through pattern;Decreased stride length    Ambulation Surface  Level    Gait velocity  1.3 m/s      Balance   Balance Assessed  Yes      Static Standing Balance   Static Standing - Balance Support  No upper extremity supported    Static Standing - Level of Assistance  7: Independent    Static Standing Balance -  Activities   Single Leg Stance - Right Leg;Single Leg Stance - Left Leg solid and foam    Static Standing - Comment/# of Minutes  SOLID SLS: Rt. 31.56 secs, Lt. 10.75 secs. SLS FOAM: Rt. 12 secs; Lt. 10 secs      Standardized Balance Assessment   Five times sit to stand comments   Patient performed 5 sit to stands in 9.61 seconds                   [facility] Adult PT Treatment/Exercise -  02/19/18 0001      Ankle Exercises: Supine   Other Supine Ankle Exercises  Bridge 2 x 10             PT Education - 02/19/18 1044    Education provided  Yes    Education Details  Patient was educated on examination findings and updated HEP and about local senior center.     Person(s) Educated  Patient    Methods  Explanation;Handout;Demonstration;Verbal cues    Comprehension  Verbalized understanding;Returned demonstration       PT Short Term Goals - 02/19/18 1046      PT SHORT TERM GOAL #1   Title  Patient will demonstrate understanding and report regular compliance of HEP.     Baseline  02/19/18: Patient stated she has been doing her exercises as able.     Time  2    Period  Weeks    Status  Achieved      PT SHORT TERM GOAL #2   Title  Patient will demonstrate improvement in active range of motion of 5 degrees in left ankle plantarflexion, dorsiflexion, inversion and eversion in order to improve patient's mechanics and tolerance with gait.    Baseline  02/19/18: Patient improved with left ankle AROM by at least 5 degrees in all motions (see objective measures).     Time  2    Period  Weeks    Status  Achieved      PT SHORT TERM GOAL #3   Title  Patient will demonstrate ability to ambulate at a velocity of at least 0.8 m/s during 3MWT and ambulating at least 500 feet as evidence of improved gait ambulation.      Baseline  02/19/18: Patient ambulated at 1.3 m/s during the 3MWT and over 500 feet.     Time  3    Period  Weeks    Status  Achieved        PT Long Term Goals - 02/19/18 1048      PT LONG TERM GOAL #1   Title  Patient will demonstrate ability to perform 5 times sit to stand in less than 12 seconds as evidence of improvement in ability to perform sit to stands and decreased risk of falls.     Baseline  02/19/18: Patient performed 5xSTS in 9.61 seconds.     Time  4    Period  Weeks    Status  Achieved      PT LONG TERM GOAL #2   Title  Patient will  demonstrate ability to maintain single limb stance on each lower extremity on solid and foam for 10 seconds or greater indicating improved balance and decreased risk of falls.     Baseline  02/19/18: Patient performed SLS on solid and foam surface on each lower extremity for at least 10 seconds (see objective measures).     Time  4    Period  Weeks    Status  Achieved      PT LONG TERM GOAL #3   Title  Patient will demonstrate ability to ambulate at a velocity of 1.0 m/s with 3 MWT without visible circumduction of left lower extremity and without exacerbation of pain as evidence of improved ambulation.    Baseline  02/19/18: Patient ambulated at 1.3 m/s during the 3MWT and over 500 feet.     Time  4    Period  Weeks    Status  Achieved      PT LONG TERM GOAL #4   Title  Patient will report ability to ambulate for 30 minutes in order to assist with ambulation through grocery store without use of motorized wheelchair scooter.     Baseline  02/19/18: Patient reported that she is able to ambulate for at least 30 minutes at this time.     Time  4    Period  Weeks    Status  Achieved      PT LONG TERM GOAL #5   Title  Patient will demonstrate improvement of 1 MMT or 5/5 MMT grade in all deficient planes as evidence of improved strength to assist with functional mobility.     Baseline  02/19/18: Patient improved by 1 MMT grade of up to 5/5 MMT grade in most, but not all musculature (see objective measures).     Time  4    Period  Weeks    Status  Partially Met            Plan - 02/19/18  1055    Clinical Impression Statement  This session performed a re-assessment of patient's progress toward goals. Patient achieved 3 out of 3 short term goals. Patient achieved 4 out of 5 long term goals and partially met 1 long term goal. Patient continued to demonstrate deficits in bilateral hip extension strength, as well as left plantarflexion strength. Patient was provided updated HEP including SLS  practice, ankle strengthening, and bridges for hip extension strengthening. Patient was provided information on the local senior center as well. Patient is being discharged at this time as she reported that she is pleased with her current functional status. Therapist provided patient with contact information for clinic and told to call with any future questions or concerns.     Rehab Potential  Fair    Clinical Impairments Affecting Rehab Potential  Positive: Patient motivation; Negative: comorbidities, severity of pain    PT Frequency  2x / week    PT Duration  4 weeks 4 additional weeks    PT Treatment/Interventions  ADLs/Self Care Home Management;Cryotherapy;DME Instruction;Gait training;Stair training;Functional mobility training;Therapeutic activities;Therapeutic exercise;Balance training;Neuromuscular re-education;Patient/family education;Orthotic Fit/Training;Manual techniques;Passive range of motion;Compression bandaging;Taping    PT Next Visit Plan  Patient is being discharged at this time.     PT Home Exercise Plan  Ankle ABC's for Left ankle 1 x 2 times a day; 4 way SLR all directions. 11/12/17 - scar tissue mobs, progressive CAM boot wearing schedule. 02/19/18: Bridges, ankle strengthening with red theraband, and SLS at support surface.     Consulted and Agree with Plan of Care  Patient       Patient will benefit from skilled therapeutic intervention in order to improve the following deficits and impairments:  Abnormal gait, Decreased balance, Decreased endurance, Decreased mobility, Difficulty walking, Impaired sensation, Decreased range of motion, Increased edema, Improper body mechanics, Decreased activity tolerance, Decreased strength, Pain  Visit Diagnosis: S/P ORIF (open reduction internal fixation) fracture  Other abnormalities of gait and mobility  Decreased range of motion of left ankle  Decreased strength     Problem List Patient Active Problem List   Diagnosis Date  Noted  . S/P ORIF (open reduction internal fixation) fracture 07/12/17  07/19/2017  . Trimalleolar fracture of ankle, closed, left, with routine healing, subsequent encounter 07/12/2017  . Closed trimalleolar fracture of left ankle   . Insomnia 02/21/2012  . Routine health maintenance 07/25/2011  . DEPRESSION/ANXIETY 06/17/2010  . Attention deficit disorder 02/02/2009  . Osteopenia 10/23/2007  . CHRONIC PAIN SYNDROME 09/11/2007  . HEMORRHOIDS 09/11/2007  . IRRITABLE BOWEL SYNDROME 09/11/2007  . OSTEOARTHRITIS 09/11/2007   PHYSICAL THERAPY DISCHARGE SUMMARY  Visits from Start of Care: 8  Current functional level related to goals / functional outcomes: See above   Remaining deficits: See above   Education / Equipment: Updated HEP, continuing activity at a gym, visiting the senior center. See above for more detail.  Plan: Patient agrees to discharge.  Patient goals were partially met. Patient is being discharged due to being pleased with the current functional level.  ?????         Clarene Critchley PT, DPT 11:02 AM, 02/19/18 [place], [place], [address] Phone: [phone]   Fax:  [phone]  Name: Regis Wiland MRN: [id number] Date of Birth: Aug 04, 1956

## 2018-02-27 ENCOUNTER — Encounter (HOSPITAL_COMMUNITY): Payer: Medicaid Other | Admitting: Physical Therapy

## 2018-03-10 DIAGNOSIS — Z23 Encounter for immunization: Secondary | ICD-10-CM | POA: Diagnosis not present

## 2018-06-12 ENCOUNTER — Ambulatory Visit: Payer: BLUE CROSS/BLUE SHIELD | Admitting: Family Medicine

## 2018-06-12 ENCOUNTER — Encounter: Payer: Self-pay | Admitting: Family Medicine

## 2018-06-12 VITALS — BP 120/62 | HR 92 | Temp 97.9°F | Ht 62.75 in | Wt 154.7 lb

## 2018-06-12 DIAGNOSIS — K5909 Other constipation: Secondary | ICD-10-CM

## 2018-06-12 DIAGNOSIS — K589 Irritable bowel syndrome without diarrhea: Secondary | ICD-10-CM

## 2018-06-12 DIAGNOSIS — R011 Cardiac murmur, unspecified: Secondary | ICD-10-CM

## 2018-06-12 DIAGNOSIS — E785 Hyperlipidemia, unspecified: Secondary | ICD-10-CM

## 2018-06-12 DIAGNOSIS — M1991 Primary osteoarthritis, unspecified site: Secondary | ICD-10-CM | POA: Diagnosis not present

## 2018-06-12 DIAGNOSIS — G894 Chronic pain syndrome: Secondary | ICD-10-CM

## 2018-06-12 DIAGNOSIS — R5383 Other fatigue: Secondary | ICD-10-CM

## 2018-06-12 DIAGNOSIS — R350 Frequency of micturition: Secondary | ICD-10-CM

## 2018-06-12 DIAGNOSIS — M8580 Other specified disorders of bone density and structure, unspecified site: Secondary | ICD-10-CM

## 2018-06-12 DIAGNOSIS — Z79899 Other long term (current) drug therapy: Secondary | ICD-10-CM

## 2018-06-12 DIAGNOSIS — N3000 Acute cystitis without hematuria: Secondary | ICD-10-CM

## 2018-06-12 DIAGNOSIS — Z1231 Encounter for screening mammogram for malignant neoplasm of breast: Secondary | ICD-10-CM

## 2018-06-12 DIAGNOSIS — F988 Other specified behavioral and emotional disorders with onset usually occurring in childhood and adolescence: Secondary | ICD-10-CM

## 2018-06-12 DIAGNOSIS — Z1239 Encounter for other screening for malignant neoplasm of breast: Secondary | ICD-10-CM

## 2018-06-12 DIAGNOSIS — F341 Dysthymic disorder: Secondary | ICD-10-CM

## 2018-06-12 LAB — POCT URINALYSIS DIPSTICK
Bilirubin, UA: NEGATIVE
Glucose, UA: POSITIVE — AB
Ketones, UA: NEGATIVE
NITRITE UA: POSITIVE
PH UA: 6 (ref 5.0–8.0)
Protein, UA: POSITIVE — AB
Spec Grav, UA: >=1.03 — AB (ref 1.010–1.025)
UROBILINOGEN UA: 0.2 U/dL

## 2018-06-12 MED ORDER — NITROFURANTOIN MONOHYD MACRO 100 MG PO CAPS
100.0000 mg | ORAL_CAPSULE | Freq: Two times a day (BID) | ORAL | 0 refills | Status: DC
Start: 2018-06-12 — End: 2018-06-13
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
## 2018-06-12 NOTE — Patient Instructions (Signed)
I will call you with lab results when I get them.   Let us know if any worsening of urinary symptoms in meanwhile.

## 2018-06-13 ENCOUNTER — Other Ambulatory Visit: Payer: Self-pay

## 2018-06-13 ENCOUNTER — Ambulatory Visit: Payer: Self-pay | Admitting: Family Medicine

## 2018-06-13 MED ORDER — CEPHALEXIN 500 MG PO CAPS: ORAL_CAPSULE | ORAL | 0 refills | Status: DC

## 2018-06-13 NOTE — Telephone Encounter (Signed)
Spoke with patient she states that she would prefer we just change the medication. Rx has been sent in and macrobid added to allergies.

## 2018-06-13 NOTE — Telephone Encounter (Signed)
Pt. called to report she is certain she has reacted to the Nitrofurantoin.  Reported she took one dose at 5:00 PM, 9/18.  C/o onset of muscle cramps in her upper thigh, after she went to bed.  Reported the thigh cramping progressed to her back and neck.  Took Oxycodone at 11:00 PM for the muscle cramping.  Fell asleep. Woke up at some point with "migraine headache", and reported pain was 10/10.  C/o HA pain  in top of head, forehead, and surrounding her eyes.  Pt. unsure of timing, but reported she took dose of Tylenol ES, 1 tab, and Advil 200 mg, 2 tabs, during night. Reported after about 5-10 minutes, took a 2nd dose of Oxycodone; thought this was taken about 4:00 AM.  C/o nausea.   Reported taking Zofran, 1 tablet, at some point, during the night.  Denied any swelling of face, lips, tongue, or throat.  Denied any shortness of breath or chest pain.  C/o intermittent itching, but has not noticed a rash. Currently rates migraine pain at 7-8/10. Reported last week, she took a "Macrobid" of her daughters x 1, for her UTI symptoms, and had the same reaction.  Stated she didn't realize that she was prescribed the same medication yesterday, under a different name.   Called [facility]; spoke with Vita Barley, RN.  Was advised pt. Should be seen in the office to r/o other causes.  Advised pt. Can take Benadryl 50 mg. to attempt to reduce effects of reaction to Nitrofurantoin.  Pt. was advised of  above recommendations.  Advised of taking precautions with Benadryl, due to making her sleepy; should not drive after taking this.  Pt. Declined appt. Today, stating she does not have any transportation, and feels to bad to driver herself.  Appt. Given 06/14/18 @ 1:00 PM.  Strongly encouraged to go to ER if symptoms worsen.  Verb. Understanding; agrees with plan.             Reason for Disposition . Caller has URGENT medication question about med that PCP prescribed and triager unable to answer question  Answer Assessment - Initial  Assessment Questions 1. SYMPTOMS: "Do you have any symptoms?"     C/o muscle cramps in upper thighs during night that progressed to across the back, and neck cramps, and now c/o migraine headache; denied vision change. C/o nausea; denied swelling of lips, tongue or throat; denied shortness of breath or chest pain;  C/o generalized itching; unsure if she has a rash.  Took an Oxycodone for the pain in her back; about 11:00 PM; later woke up with migraine 10/10, in top of head, forehead, and around the eyes.       2. SEVERITY: If symptoms are present, ask "Are they mild, moderate or severe?"     7-8/10  Protocols used: MEDICATION QUESTION CALL-A-AH

## 2018-06-13 NOTE — Telephone Encounter (Signed)
So if her adverse reaction symptoms have resolved, then I am not sure I need to see her; we can just change antibiotics. If still not feeling well I'm happy to see her again.   Please add macrobid to allergy list.   We are limited with antibiotic choice due to allergies. My recommendation would be to try keflex 500mg  PO BID x 7 days. This is in a different category than her listed allergies, although there is a small crossover with penicillin, but typically not an issue.   Urine didn't get sent for culture, so we can either have her drop off urine to culture, OR if she wants to try the different antibiotic and see how symptoms do that would be ok as well.

## 2018-06-14 ENCOUNTER — Ambulatory Visit: Payer: BLUE CROSS/BLUE SHIELD | Admitting: Family Medicine

## 2018-06-14 NOTE — Telephone Encounter (Signed)
Pt appointment has been canceled

## 2018-06-14 NOTE — Telephone Encounter (Signed)
Noted. Can you take her off the schedule please?

## 2018-06-24 ENCOUNTER — Other Ambulatory Visit (INDEPENDENT_AMBULATORY_CARE_PROVIDER_SITE_OTHER): Payer: BLUE CROSS/BLUE SHIELD

## 2018-06-24 DIAGNOSIS — F341 Dysthymic disorder: Secondary | ICD-10-CM | POA: Diagnosis not present

## 2018-06-24 DIAGNOSIS — R5383 Other fatigue: Secondary | ICD-10-CM | POA: Diagnosis not present

## 2018-06-24 DIAGNOSIS — E785 Hyperlipidemia, unspecified: Secondary | ICD-10-CM | POA: Diagnosis not present

## 2018-06-24 DIAGNOSIS — Z79899 Other long term (current) drug therapy: Secondary | ICD-10-CM

## 2018-06-24 DIAGNOSIS — M8580 Other specified disorders of bone density and structure, unspecified site: Secondary | ICD-10-CM | POA: Diagnosis not present

## 2018-06-24 LAB — LIPID PANEL
CHOLESTEROL: 233 mg/dL — AB (ref 0–200)
HDL: 42.3 mg/dL (ref >39.00)
LDL Cholesterol: 153 mg/dL — ABNORMAL HIGH (ref 0–99)
NONHDL: 190.52
Total CHOL/HDL Ratio: 6
Triglycerides: 189 mg/dL — ABNORMAL HIGH (ref 0.0–149.0)
VLDL: 37.8 mg/dL (ref 0.0–40.0)

## 2018-06-24 LAB — COMPREHENSIVE METABOLIC PANEL
ALT: 10 U/L (ref 0–35)
AST: 13 U/L (ref 0–37)
Albumin: 4.1 g/dL (ref 3.5–5.2)
Alkaline Phosphatase: 71 U/L (ref 39–117)
BILIRUBIN TOTAL: 0.4 mg/dL (ref 0.2–1.2)
BUN: 11 mg/dL (ref 6–23)
CALCIUM: 9.5 mg/dL (ref 8.4–10.5)
CHLORIDE: 103 meq/L (ref 96–112)
CO2: 29 meq/L (ref 19–32)
Creatinine, Ser: 0.9 mg/dL (ref 0.40–1.20)
GFR: 67.33 mL/min (ref >60.00)
Glucose, Bld: 84 mg/dL (ref 70–99)
Potassium: 5 mEq/L (ref 3.5–5.1)
Sodium: 140 mEq/L (ref 135–145)
Total Protein: 6.7 g/dL (ref 6.0–8.3)

## 2018-06-24 LAB — CBC WITH DIFFERENTIAL/PLATELET
BASOS ABS: 0.1 10*3/uL (ref 0.0–0.1)
Basophils Relative: 1.1 % (ref 0.0–3.0)
Eosinophils Absolute: 0.6 10*3/uL (ref 0.0–0.7)
Eosinophils Relative: 9.6 % — ABNORMAL HIGH (ref 0.0–5.0)
HCT: 45.2 % (ref 36.0–46.0)
Hemoglobin: 15.3 g/dL — ABNORMAL HIGH (ref 12.0–15.0)
LYMPHS ABS: 2.3 10*3/uL (ref 0.7–4.0)
LYMPHS PCT: 37.5 % (ref 12.0–46.0)
MCHC: 34 g/dL (ref 30.0–36.0)
MCV: 94.2 fl (ref 78.0–100.0)
MONO ABS: 0.4 10*3/uL (ref 0.1–1.0)
Monocytes Relative: 6.5 % (ref 3.0–12.0)
NEUTROS ABS: 2.8 10*3/uL (ref 1.4–7.7)
NEUTROS PCT: 45.3 % (ref 43.0–77.0)
PLATELETS: 343 10*3/uL (ref 150.0–400.0)
RBC: 4.8 Mil/uL (ref 3.87–5.11)
RDW: 12.9 % (ref 11.5–15.5)
WBC: 6.2 10*3/uL (ref 4.0–10.5)

## 2018-06-24 LAB — VITAMIN D 25 HYDROXY (VIT D DEFICIENCY, FRACTURES): VITD: 14.9 ng/mL — AB (ref 30.00–100.00)

## 2018-06-24 LAB — TSH: TSH: 3.12 u[IU]/mL (ref 0.35–4.50)

## 2018-06-24 LAB — VITAMIN B12: Vitamin B-12: 258 pg/mL (ref 211–911)

## 2018-06-26 ENCOUNTER — Other Ambulatory Visit: Payer: Self-pay | Admitting: Family Medicine

## 2018-06-26 MED ORDER — FLUCONAZOLE 150 MG PO TABS
150.0000 mg | ORAL_TABLET | Freq: Once | ORAL | 0 refills | Status: AC
Start: 2018-06-26 — End: 2018-06-26

## 2018-07-03 ENCOUNTER — Ambulatory Visit: Payer: Self-pay | Admitting: Orthopedic Surgery

## 2018-07-10 ENCOUNTER — Telehealth: Payer: Self-pay | Admitting: Family Medicine

## 2018-07-10 ENCOUNTER — Ambulatory Visit (INDEPENDENT_AMBULATORY_CARE_PROVIDER_SITE_OTHER): Payer: BLUE CROSS/BLUE SHIELD | Admitting: Family Medicine

## 2018-07-10 ENCOUNTER — Encounter: Payer: Self-pay | Admitting: Family Medicine

## 2018-07-10 VITALS — BP 120/64 | HR 85 | Temp 97.5°F | Ht 63.0 in | Wt 153.2 lb

## 2018-07-10 DIAGNOSIS — M545 Low back pain, unspecified: Secondary | ICD-10-CM

## 2018-07-10 DIAGNOSIS — Z0000 Encounter for general adult medical examination without abnormal findings: Secondary | ICD-10-CM

## 2018-07-10 DIAGNOSIS — Z23 Encounter for immunization: Secondary | ICD-10-CM | POA: Diagnosis not present

## 2018-07-10 DIAGNOSIS — Z0001 Encounter for general adult medical examination with abnormal findings: Secondary | ICD-10-CM | POA: Diagnosis not present

## 2018-07-10 DIAGNOSIS — R350 Frequency of micturition: Secondary | ICD-10-CM

## 2018-07-10 DIAGNOSIS — N8110 Cystocele, unspecified: Secondary | ICD-10-CM

## 2018-07-10 LAB — POCT URINALYSIS DIPSTICK
BILIRUBIN UA: POSITIVE
GLUCOSE UA: NEGATIVE
Ketones, UA: POSITIVE
Nitrite, UA: NEGATIVE
PH UA: 6 (ref 5.0–8.0)
Protein, UA: POSITIVE — AB
RBC UA: NEGATIVE
UROBILINOGEN UA: 1 U/dL

## 2018-07-10 MED ORDER — SULFAMETHOXAZOLE-TRIMETHOPRIM 800-160 MG PO TABS: 1.0000 | ORAL_TABLET | Freq: Two times a day (BID) | ORAL | 0 refills | Status: AC

## 2018-07-10 MED ORDER — TIZANIDINE HCL 4 MG PO TABS: 4.0000 mg | ORAL_TABLET | Freq: Four times a day (QID) | ORAL | 0 refills | Status: DC | PRN

## 2018-07-10 MED ORDER — ZOSTER VAC RECOMB ADJUVANTED 50 MCG/0.5ML IM SUSR: 0.5000 mL | Freq: Once | INTRAMUSCULAR | 1 refills | Status: AC

## 2018-07-10 NOTE — Telephone Encounter (Signed)
Copied from Catherine [phone]. Topic: Quick Communication - See Telephone Encounter >> Jul 10, 2018 11:21 AM Conception Chancy, NT wrote: CRM for notification. See Telephone encounter for: 07/10/18.  Velna Hatchet is calling from the preservice center and is needing to speak to someone in regards to prior authorization. The patient is scheduled for an Echocardiogram on Monday 07/15/18.   Cb# [phone] ext [phone]

## 2018-07-10 NOTE — Progress Notes (Signed)
[name] [name] DOB: 05-17-1956 Encounter date: 07/10/2018  This is a 62 y.o. female who presents for complete physical   History of present illness/Additional concerns: At last visit recommended miralax for chronic constipation. Took too much and then had diarrhea as a result.  Mammogram ordered at last visit: Hasn't heard about scheduling. She does have some anxiety because of significant breast tenderness (chronic)  Echo ordered and has been scheduled for 10/21. Frequent urination at night; significant urge. Felt a little better after starting medication but never went away. Did get yeast infection. Thinks she has had some chills.   Flexeril helps some for back pain, but she was interested in trying something different. Friend uses zanaflex and has had good results; would like to try this.   Past Medical History:  Diagnosis Date  . ABDOMINAL PAIN, LOWER 02/16/2009  . APPENDECTOMY, HX OF 09/11/2007  . ATTENTION DEFICIT DISORDER, ADULT 02/02/2009  . BACK PAIN 07/16/2007   chronic  . Back pain    spinal stimulator in place  . CERVICAL RADICULOPATHY, RIGHT 07/16/2007  . CHEST PAIN, ACUTE 09/03/2008  . Chronic abdominal pain   . Chronic pain syndrome 09/11/2007  . Complication of anesthesia    woke up during surgeries few times and woke up during endoscopy and colonscopy few weeks ago, in terrible pain  . DDD (degenerative disc disease), lumbosacral   . DEPRESSION/ANXIETY 06/17/2010  . Dysuria 02/20/2008  . Elevated liver enzymes 1 and  1/2 months ago  . Family history of anesthesia complication    mother woke up during surgeries  . Fibromyalgia   . Functional GI symptoms   . FX, RAMUS NOS, CLOSED 07/16/2007  . GERD (gastroesophageal reflux disease)   . H/O failed conscious sedation    PT STATES SHE IS HARD TO SEDATE!  . HEMORRHOIDS 09/11/2007  . HIATAL HERNIA, HX OF 09/11/2007  . History of kidney stones   . HX, PERSONAL, MUSCULOSKELETAL DISORD NEC 07/16/2007  . HX, PERSONAL,  URINARY CALCULI 07/16/2007  . Irritable bowel syndrome 09/11/2007  . NEOPLASM, SKIN, UNCERTAIN BEHAVIOR 11/01/7822  . OSTEOARTHRITIS 09/11/2007   oa  . OSTEOPENIA 10/23/2007  . PONV (postoperative nausea and vomiting)   . ROTATOR CUFF REPAIR, HX OF 09/11/2007  . Suicidal ideations   . SYMPTOM, PAIN, ABDOMINAL, RIGHT UP QUADRANT 07/16/2007  . TAH/BSO, HX OF 09/11/2007  . UTI 10/06/2010  . VAGINITIS, ATROPHIC, POSTMENOPAUSAL 07/16/2007   Past Surgical History:  Procedure Laterality Date  . ABDOMINAL HYSTERECTOMY  1976   removed due to abnormal paps; no uterine cancer  . APPENDECTOMY  1969  . BILATERAL OOPHORECTOMY  1993  . bone pushed back into  place after fracture Left 25-30 yrs ago   face  . cervical radiculopathy  20 yrs ago   RT  . CHOLECYSTECTOMY N/A 02/18/2014   Procedure: LAPAROSCOPIC CHOLECYSTECTOMY;  Surgeon: Harl Bowie, MD;  Location: [facility];  Service: General;  Laterality: N/A;  . COLONOSCOPY    . DILATION AND CURETTAGE OF UTERUS  age 39  . ESOPHAGOGASTRODUODENOSCOPY    . ORIF ANKLE FRACTURE Left 07/12/2017   Procedure: OPEN REDUCTION INTERNAL FIXATION (ORIF) LEFT ANKLE FRACTURE;  Surgeon: Carole Civil, MD;  Location: [facility];  Service: Orthopedics;  Laterality: Left;  . [place]   right  . SPINAL CORD STIMULATOR IMPLANT  2005   lower back, pt turned off 2-3 weeks ago due to causing pain  . TUBAL LIGATION     Allergies  Allergen  Reactions  . Ciprofloxacin Itching and Palpitations    migraine  . Macrobid [Nitrofurantoin Macrocrystal] Other (See Comments)    Body aches and Migraine headache  . Ampicillin Other (See Comments)    Told by md not to take ampilciian due to penicillin allergy  . Atorvastatin Other (See Comments)    Felt like had flu  . Buspirone Hcl Other (See Comments)    REACTION: intolerance  . Ezetimibe-Simvastatin Other (See Comments)    Unknown  . Latex Other (See Comments)    Redness and burning  . Lyrica  [Pregabalin] Other (See Comments)    Caused swelling in legs and hands  . Penicillins Other (See Comments)    Fever and itching  . Sulfa Antibiotics Nausea Only   Current Meds  Medication Sig  . acetaminophen (TYLENOL) 325 MG tablet Take 325 mg by mouth every 6 (six) hours as needed for moderate pain or headache.  . amphetamine-dextroamphetamine (ADDERALL) 30 MG tablet Take 10 mg by mouth daily as needed (for attention).   Marland Kitchen aspirin EC 81 MG tablet Take 81 mg by mouth daily.  . diclofenac sodium (VOLTAREN) 1 % GEL Apply 4 g topically 4 (four) times daily.   Marland Kitchen docusate sodium (DULCOLAX) 100 MG capsule Take 200 mg by mouth daily as needed (for constipation).   . gabapentin (NEURONTIN) 100 MG capsule Take 1 capsule (100 mg total) by mouth 3 (three) times daily.  Marland Kitchen ibuprofen (ADVIL,MOTRIN) 800 MG tablet Take 1 tablet (800 mg total) by mouth every 8 (eight) hours as needed.  . ondansetron (ZOFRAN) 8 MG tablet Take 1 tablet (8 mg total) by mouth every 4 (four) hours as needed for nausea or vomiting.  Marland Kitchen oxyCODONE (ROXICODONE) 15 MG immediate release tablet Take 15 mg by mouth.  . polyethylene glycol powder (GLYCOLAX/MIRALAX) powder Take 17 g by mouth daily as needed for mild constipation or moderate constipation.  . promethazine (PHENERGAN) 25 MG suppository UNWRAP AND INSERT ONE SUPPOSITORY RECTALLY EVERY 6 HOURS AS NEEDED FOR NAUSEA.  . [DISCONTINUED] cephALEXin (KEFLEX) 500 MG capsule Take one capsule by mouth twice a day for 7 days.  . [DISCONTINUED] cyclobenzaprine (FLEXERIL) 10 MG tablet Take 10 mg by mouth 3 (three) times daily as needed for muscle spasms.    Social History   Tobacco Use  . Smoking status: Never Smoker  . Smokeless tobacco: Never Used  Substance Use Topics  . Alcohol use: No   Family History  Problem Relation Age of Onset  . Heart disease Mother        CAD/ valvular disease  . Kidney disease Mother        lost a kidney -  unknown cause  . COPD Mother   . High  Cholesterol Brother        CAD  . High blood pressure Brother   . COPD Father   . Heart disease Father 71       MI  . Heart disease Sister 49  . Other Sister        complications from pain medications  . Cancer Other        Colon  . Heart disease Maternal Aunt   . Heart disease Maternal Grandmother   . Colon cancer Cousin 33       paternal  . Stroke Maternal Grandfather   . Alcoholism Brother   . Brain cancer Maternal Aunt      Review of Systems  CBC:  Lab Results  Component Value Date  WBC 6.2 06/24/2018   HGB 15.3 (H) 06/24/2018   HCT 45.2 06/24/2018   MCH 32.0 06/21/2017   MCHC 34.0 06/24/2018   RDW 12.9 06/24/2018   PLT 343.0 06/24/2018   CMP: Lab Results  Component Value Date   NA 140 06/24/2018   K 5.0 06/24/2018   CL 103 06/24/2018   CO2 29 06/24/2018   ANIONGAP 9 06/21/2017   GLUCOSE 84 06/24/2018   BUN 11 06/24/2018   CREATININE 0.90 06/24/2018   GFRAA >60 06/21/2017   CALCIUM 9.5 06/24/2018   PROT 6.7 06/24/2018   BILITOT 0.4 06/24/2018   ALKPHOS 71 06/24/2018   ALT 10 06/24/2018   AST 13 06/24/2018   LIPID: Lab Results  Component Value Date   CHOL 233 (H) 06/24/2018   TRIG 189.0 (H) 06/24/2018   HDL 42.30 06/24/2018   LDLCALC 153 (H) 06/24/2018    Objective:  BP 120/64 (BP Location: Left Arm, Patient Position: Sitting, Cuff Size: Normal)   Pulse 85   Temp (!) 97.5 F (36.4 C) (Oral)   Ht 5\' 3"  (1.6 m)   Wt 153 lb 3.2 oz (69.5 kg)   SpO2 100%   BMI 27.14 kg/m   Weight: 153 lb 3.2 oz (69.5 kg)   BP Readings from Last 3 Encounters:  07/10/18 120/64  06/12/18 120/62  11/28/17 138/88   Wt Readings from Last 3 Encounters:  07/10/18 153 lb 3.2 oz (69.5 kg)  06/12/18 154 lb 11.2 oz (70.2 kg)  11/28/17 163 lb (73.9 kg)    Physical Exam  Constitutional: She is oriented to person, place, and time. She appears well-developed and well-nourished. No distress.  HENT:  Head: Normocephalic and atraumatic.  Right Ear: External ear  normal.  Left Ear: External ear normal.  Mouth/Throat: Oropharynx is clear and moist. No oropharyngeal exudate.  Eyes: Pupils are equal, round, and reactive to light. Conjunctivae are normal.  Neck: Normal range of motion. Neck supple. No thyromegaly present.  Cardiovascular: Normal rate, regular rhythm and normal heart sounds. Exam reveals no gallop and no friction rub.  No murmur heard. Pulmonary/Chest: Effort normal and breath sounds normal. Right breast exhibits tenderness. Right breast exhibits no mass, no nipple discharge and no skin change. Left breast exhibits tenderness. Left breast exhibits no mass, no nipple discharge and no skin change. Breasts are symmetrical.  Abdominal: Soft. Bowel sounds are normal. She exhibits no distension and no mass. There is no tenderness. There is no guarding. No hernia.  Genitourinary: Pelvic exam was performed with patient supine.  Musculoskeletal: Normal range of motion. She exhibits no edema, tenderness or deformity.  Lymphadenopathy:    She has no cervical adenopathy.  Neurological: She is alert and oriented to person, place, and time. She has normal strength. She displays normal reflexes.  Reflex Scores:      Tricep reflexes are 2+ on the right side and 2+ on the left side.      Bicep reflexes are 2+ on the right side and 2+ on the left side.      Brachioradialis reflexes are 2+ on the right side and 2+ on the left side.      Patellar reflexes are 2+ on the right side and 2+ on the left side. Skin: Skin is warm and dry. No rash noted.  Psychiatric: She has a normal mood and affect. Her speech is normal and behavior is normal. Thought content normal.    Assessment/Plan: Health Maintenance Due  Topic Date Due  . Hepatitis C  Screening  04-11-1956  . HIV Screening  01/14/1971  . MAMMOGRAM  06/25/2008   Health Maintenance reviewed - shingrix, mammogram, flu. 1. Preventative health care Number given for mammogram scheduling.   2. Urinary  frequency Minimal urine but will try to send for culture. She is willing to try bactrim since side effect was nausea and she feels she will be able to tolerate this. - POCT urinalysis dipstick - Urine Culture - sulfamethoxazole-trimethoprim (BACTRIM DS,SEPTRA DS) 800-160 MG tablet; Take 1 tablet by mouth 2 (two) times daily for 5 days.  Dispense: 10 tablet; Refill: 0  3. Low back pain, unspecified back pain laterality, unspecified chronicity, unspecified whether sciatica present Trial zanaflex, but warned that cost may prohibit. - tiZANidine (ZANAFLEX) 4 MG tablet; Take 1 tablet (4 mg total) by mouth every 6 (six) hours as needed for muscle spasms.  Dispense: 30 tablet; Refill: 0  4. Female cystocele She was previously told that she needed surgery. She put this off due to cost issues. Now with recurrent UTI sx she is interested in pursuing. - Ambulatory referral to Urogynecology  5. Need for influenza vaccination - Flu Vaccine QUAD 36+ mos IM  Return pending urine cx.  Micheline Rough, MD

## 2018-07-11 LAB — URINE CULTURE
MICRO NUMBER: 91244247
SPECIMEN QUALITY:: ADEQUATE

## 2018-07-11 NOTE — Telephone Encounter (Signed)
Based on the Date of Service entered, the selected member is currently not eligible for an Order ID. Please contact the health plan

## 2018-07-15 ENCOUNTER — Ambulatory Visit (HOSPITAL_COMMUNITY): Payer: BLUE CROSS/BLUE SHIELD

## 2018-07-17 ENCOUNTER — Encounter: Payer: Self-pay | Admitting: Orthopedic Surgery

## 2018-07-17 ENCOUNTER — Ambulatory Visit (INDEPENDENT_AMBULATORY_CARE_PROVIDER_SITE_OTHER): Payer: BLUE CROSS/BLUE SHIELD

## 2018-07-17 ENCOUNTER — Ambulatory Visit: Payer: BLUE CROSS/BLUE SHIELD | Admitting: Orthopedic Surgery

## 2018-07-17 VITALS — BP 98/65 | HR 69 | Ht 63.0 in | Wt 149.0 lb

## 2018-07-17 DIAGNOSIS — Z9889 Other specified postprocedural states: Secondary | ICD-10-CM

## 2018-07-17 DIAGNOSIS — Z8781 Personal history of (healed) traumatic fracture: Secondary | ICD-10-CM | POA: Diagnosis not present

## 2018-07-17 NOTE — Progress Notes (Signed)
Chief Complaint  Patient presents with  . Routine Post Op    07/12/17 left ankle ORIF improving still has pain at times     49F  1-year follow-up trimalleolar fracture left ankle last year July 12, 2017 complains of some medial pain some sensory loss in the dorsum and medial aspect of the foot with swelling when she is walking although now that she is lost about 15 pounds it does not swell as much when she walks  System review nothing to add other than the numbness and tingling  Allergies  Allergen Reactions  . Ciprofloxacin Itching and Palpitations    migraine  . Macrobid [Nitrofurantoin Macrocrystal] Other (See Comments)    Body aches and Migraine headache  . Ampicillin Other (See Comments)    Told by md not to take ampilciian due to penicillin allergy  . Atorvastatin Other (See Comments)    Felt like had flu  . Buspirone Hcl Other (See Comments)    REACTION: intolerance  . Ezetimibe-Simvastatin Other (See Comments)    Unknown  . Latex Other (See Comments)    Redness and burning  . Lyrica [Pregabalin] Other (See Comments)    Caused swelling in legs and hands  . Penicillins Other (See Comments)    Fever and itching  . Sulfa Antibiotics Nausea Only    Current Meds  Medication Sig  . acetaminophen (TYLENOL) 325 MG tablet Take 325 mg by mouth every 6 (six) hours as needed for moderate pain or headache.  . amphetamine-dextroamphetamine (ADDERALL) 30 MG tablet Take 10 mg by mouth daily as needed (for attention).   Marland Kitchen aspirin EC 81 MG tablet Take 81 mg by mouth daily.  . diclofenac sodium (VOLTAREN) 1 % GEL Apply 4 g topically 4 (four) times daily.   Marland Kitchen docusate sodium (DULCOLAX) 100 MG capsule Take 200 mg by mouth daily as needed (for constipation).   . gabapentin (NEURONTIN) 100 MG capsule Take 1 capsule (100 mg total) by mouth 3 (three) times daily.  Marland Kitchen ibuprofen (ADVIL,MOTRIN) 800 MG tablet Take 1 tablet (800 mg total) by mouth every 8 (eight) hours as needed.  . ondansetron  (ZOFRAN) 8 MG tablet Take 1 tablet (8 mg total) by mouth every 4 (four) hours as needed for nausea or vomiting.  Marland Kitchen oxyCODONE (ROXICODONE) 15 MG immediate release tablet Take 15 mg by mouth.  . polyethylene glycol powder (GLYCOLAX/MIRALAX) powder Take 17 g by mouth daily as needed for mild constipation or moderate constipation.  . promethazine (PHENERGAN) 25 MG suppository UNWRAP AND INSERT ONE SUPPOSITORY RECTALLY EVERY 6 HOURS AS NEEDED FOR NAUSEA.  Marland Kitchen tiZANidine (ZANAFLEX) 4 MG tablet Take 1 tablet (4 mg total) by mouth every 6 (six) hours as needed for muscle spasms.     BP 98/65   Pulse 69   Ht 5\' 3"  (1.6 m)   Wt 149 lb (67.6 kg)   BMI 26.39 kg/m   Physical Exam  Musculoskeletal:       Feet:  Psychiatric: Her affect is blunt. She exhibits a depressed mood.   Gait detected slight limp on the left side   Imaging was obtained 3 views left ankle see dictated report  Summary of x-ray intact hardware ankle mortise intact mild degenerative changes lateral tibial talar surface  Offered the patient a chance to if she wanted to have the medial pin taken out but recommended against that unless this becomes unbearable on the medial side  Otherwise follow-up as needed

## 2019-01-10 ENCOUNTER — Other Ambulatory Visit: Payer: Self-pay

## 2019-02-19 DIAGNOSIS — M8949 Other hypertrophic osteoarthropathy, multiple sites: Secondary | ICD-10-CM | POA: Diagnosis not present

## 2019-02-19 DIAGNOSIS — M5030 Other cervical disc degeneration, unspecified cervical region: Secondary | ICD-10-CM | POA: Diagnosis not present

## 2019-02-19 DIAGNOSIS — G894 Chronic pain syndrome: Secondary | ICD-10-CM | POA: Diagnosis not present

## 2019-02-19 DIAGNOSIS — M5137 Other intervertebral disc degeneration, lumbosacral region: Secondary | ICD-10-CM | POA: Diagnosis not present

## 2019-03-20 DIAGNOSIS — M5030 Other cervical disc degeneration, unspecified cervical region: Secondary | ICD-10-CM | POA: Diagnosis not present

## 2019-03-20 DIAGNOSIS — M5137 Other intervertebral disc degeneration, lumbosacral region: Secondary | ICD-10-CM | POA: Diagnosis not present

## 2019-03-20 DIAGNOSIS — M533 Sacrococcygeal disorders, not elsewhere classified: Secondary | ICD-10-CM | POA: Diagnosis not present

## 2019-03-20 DIAGNOSIS — M8949 Other hypertrophic osteoarthropathy, multiple sites: Secondary | ICD-10-CM | POA: Diagnosis not present

## 2019-04-16 DIAGNOSIS — M8949 Other hypertrophic osteoarthropathy, multiple sites: Secondary | ICD-10-CM | POA: Diagnosis not present

## 2019-04-16 DIAGNOSIS — M5137 Other intervertebral disc degeneration, lumbosacral region: Secondary | ICD-10-CM | POA: Diagnosis not present

## 2019-05-14 DIAGNOSIS — G894 Chronic pain syndrome: Secondary | ICD-10-CM | POA: Diagnosis not present

## 2019-05-14 DIAGNOSIS — M533 Sacrococcygeal disorders, not elsewhere classified: Secondary | ICD-10-CM | POA: Diagnosis not present

## 2019-05-14 DIAGNOSIS — M5137 Other intervertebral disc degeneration, lumbosacral region: Secondary | ICD-10-CM | POA: Diagnosis not present

## 2019-05-14 DIAGNOSIS — M5030 Other cervical disc degeneration, unspecified cervical region: Secondary | ICD-10-CM | POA: Diagnosis not present

## 2019-05-30 DIAGNOSIS — Z789 Other specified health status: Secondary | ICD-10-CM | POA: Diagnosis not present

## 2019-05-30 DIAGNOSIS — R350 Frequency of micturition: Secondary | ICD-10-CM | POA: Diagnosis not present

## 2019-05-30 DIAGNOSIS — Z6839 Body mass index (BMI) 39.0-39.9, adult: Secondary | ICD-10-CM | POA: Diagnosis not present

## 2019-05-30 DIAGNOSIS — F419 Anxiety disorder, unspecified: Secondary | ICD-10-CM | POA: Diagnosis not present

## 2019-05-30 DIAGNOSIS — R110 Nausea: Secondary | ICD-10-CM | POA: Diagnosis not present

## 2019-05-30 DIAGNOSIS — Z299 Encounter for prophylactic measures, unspecified: Secondary | ICD-10-CM | POA: Diagnosis not present

## 2019-05-30 DIAGNOSIS — N390 Urinary tract infection, site not specified: Secondary | ICD-10-CM | POA: Diagnosis not present

## 2019-05-30 DIAGNOSIS — B1920 Unspecified viral hepatitis C without hepatic coma: Secondary | ICD-10-CM | POA: Diagnosis not present

## 2019-06-18 DIAGNOSIS — M545 Low back pain: Secondary | ICD-10-CM | POA: Diagnosis not present

## 2019-06-18 DIAGNOSIS — G894 Chronic pain syndrome: Secondary | ICD-10-CM | POA: Diagnosis not present

## 2019-07-14 DIAGNOSIS — G894 Chronic pain syndrome: Secondary | ICD-10-CM | POA: Diagnosis not present

## 2019-07-14 DIAGNOSIS — M533 Sacrococcygeal disorders, not elsewhere classified: Secondary | ICD-10-CM | POA: Diagnosis not present

## 2019-07-14 DIAGNOSIS — M5030 Other cervical disc degeneration, unspecified cervical region: Secondary | ICD-10-CM | POA: Diagnosis not present

## 2019-07-14 DIAGNOSIS — M797 Fibromyalgia: Secondary | ICD-10-CM | POA: Diagnosis not present

## 2019-08-08 DIAGNOSIS — H5213 Myopia, bilateral: Secondary | ICD-10-CM | POA: Diagnosis not present

## 2019-08-08 DIAGNOSIS — G894 Chronic pain syndrome: Secondary | ICD-10-CM | POA: Diagnosis not present

## 2019-08-08 DIAGNOSIS — H5202 Hypermetropia, left eye: Secondary | ICD-10-CM | POA: Diagnosis not present

## 2019-08-12 DIAGNOSIS — G894 Chronic pain syndrome: Secondary | ICD-10-CM | POA: Diagnosis not present

## 2019-08-12 DIAGNOSIS — Z01812 Encounter for preprocedural laboratory examination: Secondary | ICD-10-CM | POA: Diagnosis not present

## 2019-08-12 DIAGNOSIS — Z20828 Contact with and (suspected) exposure to other viral communicable diseases: Secondary | ICD-10-CM | POA: Diagnosis not present

## 2019-08-12 DIAGNOSIS — I498 Other specified cardiac arrhythmias: Secondary | ICD-10-CM | POA: Diagnosis not present

## 2019-08-19 DIAGNOSIS — G894 Chronic pain syndrome: Secondary | ICD-10-CM | POA: Diagnosis not present

## 2019-08-19 DIAGNOSIS — Z969 Presence of functional implant, unspecified: Secondary | ICD-10-CM | POA: Diagnosis not present

## 2019-08-19 DIAGNOSIS — M4716 Other spondylosis with myelopathy, lumbar region: Secondary | ICD-10-CM | POA: Diagnosis not present

## 2019-08-19 DIAGNOSIS — Z462 Encounter for fitting and adjustment of other devices related to nervous system and special senses: Secondary | ICD-10-CM | POA: Diagnosis not present

## 2019-08-19 DIAGNOSIS — T85112A Breakdown (mechanical) of implanted electronic neurostimulator (electrode) of spinal cord, initial encounter: Secondary | ICD-10-CM | POA: Diagnosis not present

## 2019-09-04 DIAGNOSIS — R110 Nausea: Secondary | ICD-10-CM | POA: Diagnosis not present

## 2019-09-04 DIAGNOSIS — N644 Mastodynia: Secondary | ICD-10-CM | POA: Diagnosis not present

## 2019-09-04 DIAGNOSIS — K5909 Other constipation: Secondary | ICD-10-CM | POA: Diagnosis not present

## 2019-09-08 DIAGNOSIS — H25813 Combined forms of age-related cataract, bilateral: Secondary | ICD-10-CM | POA: Diagnosis not present

## 2019-09-08 DIAGNOSIS — H35371 Puckering of macula, right eye: Secondary | ICD-10-CM | POA: Diagnosis not present

## 2019-09-10 DIAGNOSIS — H524 Presbyopia: Secondary | ICD-10-CM | POA: Diagnosis not present

## 2019-09-16 DIAGNOSIS — M545 Low back pain: Secondary | ICD-10-CM | POA: Diagnosis not present

## 2019-09-24 DIAGNOSIS — N644 Mastodynia: Secondary | ICD-10-CM | POA: Diagnosis not present

## 2019-11-12 DIAGNOSIS — Z79891 Long term (current) use of opiate analgesic: Secondary | ICD-10-CM | POA: Diagnosis not present

## 2019-11-12 DIAGNOSIS — G894 Chronic pain syndrome: Secondary | ICD-10-CM | POA: Diagnosis not present

## 2019-12-11 DIAGNOSIS — M5416 Radiculopathy, lumbar region: Secondary | ICD-10-CM | POA: Diagnosis not present

## 2019-12-11 DIAGNOSIS — G894 Chronic pain syndrome: Secondary | ICD-10-CM | POA: Diagnosis not present

## 2020-01-08 DIAGNOSIS — G894 Chronic pain syndrome: Secondary | ICD-10-CM | POA: Diagnosis not present

## 2020-01-08 DIAGNOSIS — M5416 Radiculopathy, lumbar region: Secondary | ICD-10-CM | POA: Diagnosis not present

## 2020-02-05 DIAGNOSIS — I10 Essential (primary) hypertension: Secondary | ICD-10-CM | POA: Diagnosis not present

## 2020-02-05 DIAGNOSIS — M5416 Radiculopathy, lumbar region: Secondary | ICD-10-CM | POA: Diagnosis not present

## 2020-02-05 DIAGNOSIS — Z6827 Body mass index (BMI) 27.0-27.9, adult: Secondary | ICD-10-CM | POA: Diagnosis not present

## 2020-02-05 DIAGNOSIS — G894 Chronic pain syndrome: Secondary | ICD-10-CM | POA: Diagnosis not present

## 2020-02-26 ENCOUNTER — Telehealth: Payer: Self-pay | Admitting: Orthopedic Surgery

## 2020-02-26 NOTE — Telephone Encounter (Signed)
error 

## 2020-03-11 DIAGNOSIS — G894 Chronic pain syndrome: Secondary | ICD-10-CM | POA: Diagnosis not present

## 2020-03-11 DIAGNOSIS — M5416 Radiculopathy, lumbar region: Secondary | ICD-10-CM | POA: Diagnosis not present

## 2020-03-19 ENCOUNTER — Ambulatory Visit: Payer: BLUE CROSS/BLUE SHIELD | Admitting: Family Medicine

## 2020-03-19 ENCOUNTER — Other Ambulatory Visit: Payer: Self-pay

## 2020-03-19 ENCOUNTER — Ambulatory Visit (INDEPENDENT_AMBULATORY_CARE_PROVIDER_SITE_OTHER): Payer: Medicaid Other | Admitting: Family Medicine

## 2020-03-19 ENCOUNTER — Encounter: Payer: Self-pay | Admitting: Family Medicine

## 2020-03-19 VITALS — BP 102/58 | HR 91 | Temp 97.7°F | Ht 63.5 in | Wt 159.3 lb

## 2020-03-19 DIAGNOSIS — N390 Urinary tract infection, site not specified: Secondary | ICD-10-CM

## 2020-03-19 DIAGNOSIS — Z0000 Encounter for general adult medical examination without abnormal findings: Secondary | ICD-10-CM | POA: Diagnosis not present

## 2020-03-19 DIAGNOSIS — R5383 Other fatigue: Secondary | ICD-10-CM

## 2020-03-19 DIAGNOSIS — N8110 Cystocele, unspecified: Secondary | ICD-10-CM

## 2020-03-19 DIAGNOSIS — Z833 Family history of diabetes mellitus: Secondary | ICD-10-CM | POA: Diagnosis not present

## 2020-03-19 DIAGNOSIS — R7989 Other specified abnormal findings of blood chemistry: Secondary | ICD-10-CM

## 2020-03-19 DIAGNOSIS — E559 Vitamin D deficiency, unspecified: Secondary | ICD-10-CM

## 2020-03-19 DIAGNOSIS — E538 Deficiency of other specified B group vitamins: Secondary | ICD-10-CM | POA: Diagnosis not present

## 2020-03-19 DIAGNOSIS — M2550 Pain in unspecified joint: Secondary | ICD-10-CM | POA: Diagnosis not present

## 2020-03-19 DIAGNOSIS — R05 Cough: Secondary | ICD-10-CM | POA: Diagnosis not present

## 2020-03-19 DIAGNOSIS — E785 Hyperlipidemia, unspecified: Secondary | ICD-10-CM

## 2020-03-19 DIAGNOSIS — R059 Cough, unspecified: Secondary | ICD-10-CM

## 2020-03-19 LAB — CBC WITH DIFFERENTIAL/PLATELET
Basophils Absolute: 0.1 10*3/uL (ref 0.0–0.1)
Basophils Relative: 0.8 % (ref 0.0–3.0)
Eosinophils Absolute: 0.1 10*3/uL (ref 0.0–0.7)
Eosinophils Relative: 2.1 % (ref 0.0–5.0)
HCT: 44.1 % (ref 36.0–46.0)
Hemoglobin: 15.2 g/dL — ABNORMAL HIGH (ref 12.0–15.0)
Lymphocytes Relative: 33.1 % (ref 12.0–46.0)
Lymphs Abs: 2.2 10*3/uL (ref 0.7–4.0)
MCHC: 34.4 g/dL (ref 30.0–36.0)
MCV: 94.9 fl (ref 78.0–100.0)
Monocytes Absolute: 0.4 10*3/uL (ref 0.1–1.0)
Monocytes Relative: 6.8 % (ref 3.0–12.0)
Neutro Abs: 3.7 10*3/uL (ref 1.4–7.7)
Neutrophils Relative %: 57.2 % (ref 43.0–77.0)
Platelets: 304 10*3/uL (ref 150.0–400.0)
RBC: 4.65 Mil/uL (ref 3.87–5.11)
RDW: 12.9 % (ref 11.5–15.5)
WBC: 6.5 10*3/uL (ref 4.0–10.5)

## 2020-03-19 LAB — URINALYSIS
Bilirubin Urine: NEGATIVE
Hgb urine dipstick: NEGATIVE
Ketones, ur: NEGATIVE
Leukocytes,Ua: NEGATIVE
Nitrite: NEGATIVE
Specific Gravity, Urine: 1.025 (ref 1.000–1.030)
Total Protein, Urine: NEGATIVE
Urine Glucose: NEGATIVE
Urobilinogen, UA: 0.2 (ref 0.0–1.0)
pH: 5.5 (ref 5.0–8.0)

## 2020-03-19 LAB — COMPREHENSIVE METABOLIC PANEL
ALT: 19 U/L (ref 0–35)
AST: 20 U/L (ref 0–37)
Albumin: 4.4 g/dL (ref 3.5–5.2)
Alkaline Phosphatase: 97 U/L (ref 39–117)
BUN: 14 mg/dL (ref 6–23)
CO2: 28 mEq/L (ref 19–32)
Calcium: 9.2 mg/dL (ref 8.4–10.5)
Chloride: 101 mEq/L (ref 96–112)
Creatinine, Ser: 1.02 mg/dL (ref 0.40–1.20)
GFR: 54.53 mL/min — ABNORMAL LOW (ref >60.00)
Glucose, Bld: 95 mg/dL (ref 70–99)
Potassium: 4.3 mEq/L (ref 3.5–5.1)
Sodium: 137 mEq/L (ref 135–145)
Total Bilirubin: 0.6 mg/dL (ref 0.2–1.2)
Total Protein: 6.9 g/dL (ref 6.0–8.3)

## 2020-03-19 LAB — LIPID PANEL
Cholesterol: 274 mg/dL — ABNORMAL HIGH (ref 0–200)
HDL: 45.3 mg/dL (ref >39.00)
NonHDL: 228.41
Total CHOL/HDL Ratio: 6
Triglycerides: 211 mg/dL — ABNORMAL HIGH (ref 0.0–149.0)
VLDL: 42.2 mg/dL — ABNORMAL HIGH (ref 0.0–40.0)

## 2020-03-19 LAB — VITAMIN D 25 HYDROXY (VIT D DEFICIENCY, FRACTURES): VITD: 23.23 ng/mL — ABNORMAL LOW (ref 30.00–100.00)

## 2020-03-19 LAB — HEMOGLOBIN A1C: Hgb A1c MFr Bld: 5.1 % (ref 4.6–6.5)

## 2020-03-19 LAB — TSH: TSH: 5.34 u[IU]/mL — ABNORMAL HIGH (ref 0.35–4.50)

## 2020-03-19 LAB — VITAMIN B12: Vitamin B-12: 341 pg/mL (ref 211–911)

## 2020-03-19 LAB — C-REACTIVE PROTEIN: CRP: <1 mg/dL (ref 0.5–20.0)

## 2020-03-19 LAB — URIC ACID: Uric Acid, Serum: 5.2 mg/dL (ref 2.4–7.0)

## 2020-03-19 LAB — SEDIMENTATION RATE: Sed Rate: 12 mm/hr (ref 0–30)

## 2020-03-19 LAB — LDL CHOLESTEROL, DIRECT: Direct LDL: 183 mg/dL

## 2020-03-19 NOTE — Progress Notes (Signed)
[name] [name] DOB: [birth year] Encounter date: 03/19/2020  This is a 64 y.o. female who presents for complete physical   History of present illness/Additional concerns: Has had a cough for years - sometimes sounds like she is going to cough up a lung. More here than not. Started noticing in about 2008. Felt like it started related to air conditioning. Parents smoked in house but she has never smoked. Not short of breath. Does feel like there is drainage in back of throat. Thinks allergic to regular things; just doesn't want to take more if not needed. Does have acid reflux; better since gallbladder was taken out. Doesn't feel like she needs medication for this. Doesn't feel short of breath or wheezy unless she is around a smoker. Usually cough is dry.   Hard to walk due to back pain. Seeing pain mgmt and prescribed belbuca - makes her itch but is doing better with lower dose now. Does have with her pain. Also has had back stimulator fixed in December - this has made a big difference for her.   Psychiatrist started her on the viibrid which has helped with mood.   Sleeping better in last few weeks.   Foot is giving her a fit. Knows there is some arthritis in left foot setting in. Wants to loose weight; knows it will help her foot.  Knows she has UTI; seem to come right back even after treatment. Just feels like she always has one.   Last visit with me was 06/2018. Was referred to urogyn for cystocele at that time. Was going through divorce at that time so ended up not being able to go through.   Mammogram completed in [place]. Will request results.   Next colonoscopy due 01/2024 (normal in 2015)  Past Medical History:  Diagnosis Date  . ABDOMINAL PAIN, LOWER 02/16/2009  . APPENDECTOMY, HX OF 09/11/2007  . ATTENTION DEFICIT DISORDER, ADULT 02/02/2009  . BACK PAIN 07/16/2007   chronic  . Back pain    spinal stimulator in place  . CERVICAL RADICULOPATHY, RIGHT 07/16/2007  . CHEST PAIN, ACUTE  09/03/2008  . Chronic abdominal pain   . Chronic pain syndrome 09/11/2007  . Complication of anesthesia    woke up during surgeries few times and woke up during endoscopy and colonscopy few weeks ago, in terrible pain  . DDD (degenerative disc disease), lumbosacral   . DEPRESSION/ANXIETY 06/17/2010  . Dysuria 02/20/2008  . Elevated liver enzymes 1 and  1/2 months ago  . Family history of anesthesia complication    mother woke up during surgeries  . Fibromyalgia   . Functional GI symptoms   . FX, RAMUS NOS, CLOSED 07/16/2007  . GERD (gastroesophageal reflux disease)   . H/O failed conscious sedation    PT STATES SHE IS HARD TO SEDATE!  . HEMORRHOIDS 09/11/2007  . HIATAL HERNIA, HX OF 09/11/2007  . History of kidney stones   . HX, PERSONAL, MUSCULOSKELETAL DISORD NEC 07/16/2007  . HX, PERSONAL, URINARY CALCULI 07/16/2007  . Irritable bowel syndrome 09/11/2007  . NEOPLASM, SKIN, UNCERTAIN BEHAVIOR 0/09/7492  . OSTEOARTHRITIS 09/11/2007   oa  . OSTEOPENIA 10/23/2007  . PONV (postoperative nausea and vomiting)   . ROTATOR CUFF REPAIR, HX OF 09/11/2007  . Suicidal ideations   . SYMPTOM, PAIN, ABDOMINAL, RIGHT UP QUADRANT 07/16/2007  . TAH/BSO, HX OF 09/11/2007  . UTI 10/06/2010  . VAGINITIS, ATROPHIC, POSTMENOPAUSAL 07/16/2007   Past Surgical History:  Procedure Laterality Date  . ABDOMINAL HYSTERECTOMY  1976  removed due to abnormal paps; no uterine cancer  . APPENDECTOMY  1969  . BILATERAL OOPHORECTOMY  1993  . bone pushed back into  place after fracture Left 25-30 yrs ago   face  . cervical radiculopathy  20 yrs ago   RT  . CHOLECYSTECTOMY N/A 02/18/2014   Procedure: LAPAROSCOPIC CHOLECYSTECTOMY;  Surgeon: Harl Bowie, MD;  Location: [facility];  Service: General;  Laterality: N/A;  . COLONOSCOPY    . DILATION AND CURETTAGE OF UTERUS  age 80  . ESOPHAGOGASTRODUODENOSCOPY    . ORIF ANKLE FRACTURE Left 07/12/2017   Procedure: OPEN REDUCTION INTERNAL FIXATION (ORIF) LEFT  ANKLE FRACTURE;  Surgeon: Carole Civil, MD;  Location: [facility];  Service: Orthopedics;  Laterality: Left;  . [place]   right  . SPINAL CORD STIMULATOR IMPLANT  2005   lower back, pt turned off 2-3 weeks ago due to causing pain  . TUBAL LIGATION     Allergies  Allergen Reactions  . Ciprofloxacin Itching and Palpitations    migraine  . Macrobid [Nitrofurantoin Macrocrystal] Other (See Comments)    Body aches and Migraine headache  . Ampicillin Other (See Comments)    Told by md not to take ampilciian due to penicillin allergy  . Atorvastatin Other (See Comments)    Felt like had flu  . Buspirone Hcl Other (See Comments)    REACTION: intolerance  . Ezetimibe-Simvastatin Other (See Comments)    Unknown  . Latex Other (See Comments)    Redness and burning  . Lyrica [Pregabalin] Other (See Comments)    Caused swelling in legs and hands  . Penicillins Other (See Comments)    Fever and itching  . Sulfa Antibiotics Nausea Only   Current Meds  Medication Sig  . amphetamine-dextroamphetamine (ADDERALL) 30 MG tablet Take 15 mg by mouth daily as needed (for attention).   . BELBUCA 600 MCG FILM Take 1 strip by mouth 2 (two) times daily.  . cyclobenzaprine (FLEXERIL) 10 MG tablet Take 10 mg by mouth 4 (four) times daily as needed.  . diclofenac sodium (VOLTAREN) 1 % GEL Apply 4 g topically 4 (four) times daily.   Marland Kitchen docusate sodium (DULCOLAX) 100 MG capsule Take 200 mg by mouth daily as needed (for constipation).   . hydrOXYzine (ATARAX/VISTARIL) 25 MG tablet TAKE 1 TABLET BY MOUTH TWICE DAILY AS NEEDED FOR ITCHING  . ondansetron (ZOFRAN) 8 MG tablet Take 1 tablet (8 mg total) by mouth every 4 (four) hours as needed for nausea or vomiting.  Marland Kitchen oxyCODONE (OXY IR/ROXICODONE) 5 MG immediate release tablet Take 5 mg by mouth every 4 (four) hours as needed.  . polyethylene glycol powder (GLYCOLAX/MIRALAX) powder Take 17 g by mouth daily as needed for mild constipation or  moderate constipation.  . promethazine (PHENERGAN) 25 MG suppository UNWRAP AND INSERT ONE SUPPOSITORY RECTALLY EVERY 6 HOURS AS NEEDED FOR NAUSEA.  . Vilazodone HCl (VIIBRYD) 40 MG TABS Take 0.5 tablets (20 mg total) by mouth at bedtime.   Social History   Tobacco Use  . Smoking status: Never Smoker  . Smokeless tobacco: Never Used  Substance Use Topics  . Alcohol use: No   Family History  Problem Relation Age of Onset  . Heart disease Mother        CAD/ valvular disease  . Kidney disease Mother        lost a kidney -  unknown cause  . COPD Mother   . High Cholesterol Brother  CAD  . High blood pressure Brother   . COPD Father   . Heart disease Father 38       MI  . Heart disease Sister 31  . Other Sister        complications from pain medications  . Emphysema Sister   . Cancer Other        Colon  . Heart disease Maternal Aunt   . Heart disease Maternal Grandmother   . Colon cancer Cousin 58       paternal  . Stroke Maternal Grandfather   . Alcoholism Brother   . Brain cancer Maternal Aunt      Review of Systems  Constitutional: Negative for chills, fatigue and fever.  Respiratory: Positive for cough. Negative for chest tightness, shortness of breath and wheezing.   Cardiovascular: Negative for chest pain, palpitations and leg swelling.  Musculoskeletal: Positive for arthralgias, back pain and gait problem.    CBC:  Lab Results  Component Value Date   WBC 6.2 06/24/2018   HGB 15.3 (H) 06/24/2018   HCT 45.2 06/24/2018   MCH 32.0 06/21/2017   MCHC 34.0 06/24/2018   RDW 12.9 06/24/2018   PLT 343.0 06/24/2018   CMP: Lab Results  Component Value Date   NA 140 06/24/2018   K 5.0 06/24/2018   CL 103 06/24/2018   CO2 29 06/24/2018   ANIONGAP 9 06/21/2017   GLUCOSE 84 06/24/2018   BUN 11 06/24/2018   CREATININE 0.90 06/24/2018   GFRAA >60 06/21/2017   CALCIUM 9.5 06/24/2018   PROT 6.7 06/24/2018   BILITOT 0.4 06/24/2018   ALKPHOS 71 06/24/2018    ALT 10 06/24/2018   AST 13 06/24/2018   LIPID: Lab Results  Component Value Date   CHOL 233 (H) 06/24/2018   TRIG 189.0 (H) 06/24/2018   HDL 42.30 06/24/2018   LDLCALC 153 (H) 06/24/2018    Objective:  BP (!) 102/58 (BP Location: Left Arm, Patient Position: Sitting, Cuff Size: Normal)   Pulse 91   Temp 97.7 F (36.5 C) (Temporal)   Ht 5' 3.5" (1.613 m)   Wt 159 lb 4.8 oz (72.3 kg)   BMI 27.78 kg/m   Weight: 159 lb 4.8 oz (72.3 kg)   BP Readings from Last 3 Encounters:  03/19/20 (!) 102/58  07/17/18 98/65  07/10/18 120/64   Wt Readings from Last 3 Encounters:  03/19/20 159 lb 4.8 oz (72.3 kg)  07/17/18 149 lb (67.6 kg)  07/10/18 153 lb 3.2 oz (69.5 kg)    Physical Exam Constitutional:      General: She is not in acute distress.    Appearance: She is well-developed.  Cardiovascular:     Rate and Rhythm: Normal rate and regular rhythm.     Heart sounds: Murmur heard.  Crescendo decrescendo systolic murmur is present with a grade of 3/6.  No friction rub.  Pulmonary:     Effort: Pulmonary effort is normal. No respiratory distress.     Breath sounds: Normal breath sounds. No wheezing or rales.  Musculoskeletal:     Right lower leg: No edema.     Left lower leg: No edema.  Neurological:     Mental Status: She is alert and oriented to person, place, and time.  Psychiatric:        Behavior: Behavior normal.     Assessment/Plan: There are no preventive care reminders to display for this patient. Health Maintenance reviewed.  1. Preventative health care Encouraged healthy eating and discussed  tips for weight loss. Discussed portance of regular exercise.  2. Hyperlipidemia, unspecified hyperlipidemia type We will recheck lipids.  Has allergy listed to ezetimibe-simvastatin. - Lipid panel; Future - Lipid panel  3. Cough Needs further evaluation.  This is been going on for a long time.  I do not see any medications that might be triggering this.  We  discussed etiologies for chronic cough including lung abnormalities, asthma/COPD,, reflux.  Further evaluation pending chest x-ray results. - DG Chest 2 View; Future  4. Cystocele, unspecified (CODE) Patient was concerned that since she has had a hysterectomy she would not be able to get any benefit from surgical intervention. - Ambulatory referral to Urogynecology  5. Frequent UTI - Ambulatory referral to Urogynecology - Urinalysis; Future - Culture, Urine; Future - Culture, Urine - Urinalysis  6. Vitamin D deficiency - VITAMIN D 25 Hydroxy (Vit-D Deficiency, Fractures); Future - VITAMIN D 25 Hydroxy (Vit-D Deficiency, Fractures)  7. Low serum vitamin B12 - Vitamin B12; Future - Vitamin B12  8. Arthralgia, unspecified joint - Sedimentation rate; Future - ANA; Future - Rheumatoid factor; Future - Uric acid; Future - C-reactive protein; Future - C-reactive protein - Uric acid - Rheumatoid factor - ANA - Sedimentation rate  9. Fatigue, unspecified type - CBC with Differential/Platelet; Future - Comprehensive metabolic panel; Future - TSH; Future - TSH - Comprehensive metabolic panel - CBC with Differential/Platelet  10. Family history of diabetes mellitus - Hemoglobin A1c; Future - Hemoglobin A1c  Return for pending bloodwork.  Micheline Rough, MD   Patient did not complete echo previously.  I do feel that murmur needs baseline echo as I suspect aortic stenosis.  We will look into this.

## 2020-03-21 LAB — URINE CULTURE
MICRO NUMBER:: 10635555
SPECIMEN QUALITY:: ADEQUATE

## 2020-03-22 ENCOUNTER — Ambulatory Visit (INDEPENDENT_AMBULATORY_CARE_PROVIDER_SITE_OTHER)
Admission: RE | Admit: 2020-03-22 | Discharge: 2020-03-22 | Disposition: A | Discharge status: Home / Self Care | Payer: Medicaid Other | Source: Ambulatory Visit | Attending: Family Medicine | Admitting: Family Medicine

## 2020-03-22 ENCOUNTER — Other Ambulatory Visit: Payer: Medicaid Other

## 2020-03-22 ENCOUNTER — Telehealth: Payer: Self-pay | Admitting: *Deleted

## 2020-03-22 ENCOUNTER — Other Ambulatory Visit: Payer: Self-pay

## 2020-03-22 DIAGNOSIS — R05 Cough: Secondary | ICD-10-CM

## 2020-03-22 DIAGNOSIS — R059 Cough, unspecified: Secondary | ICD-10-CM

## 2020-03-22 NOTE — Telephone Encounter (Signed)
Unable to leave a message at the pts voicemail due to memory being full.

## 2020-03-22 NOTE — Telephone Encounter (Signed)
-----   Message from Caren Macadam, MD sent at 03/21/2020  7:26 PM EDT ----- I sent her message, but I am not sure she checks my chart.  I wanted to see if she ever completed the cardiac echo that was ordered?  She had mentioned history of murmur, and I did hear 1 on exam.  I do feel like this would be good for [place] to obtain.  If she had it completed please see if you can get results.  If she has not, please reorder 2D echo with diagnosis of murmur.

## 2020-03-23 LAB — ANA: Anti Nuclear Antibody (ANA): NEGATIVE

## 2020-03-23 LAB — RHEUMATOID FACTOR: Rheumatoid fact SerPl-aCnc: <14 IU/mL (ref <14)

## 2020-04-05 ENCOUNTER — Other Ambulatory Visit: Payer: Self-pay | Admitting: *Deleted

## 2020-04-05 ENCOUNTER — Telehealth: Payer: Self-pay | Admitting: *Deleted

## 2020-04-05 MED ORDER — PANTOPRAZOLE SODIUM 20 MG PO TBEC
20.0000 mg | DELAYED_RELEASE_TABLET | Freq: Every day | ORAL | 0 refills | Status: DC
Start: 2020-04-05 — End: 2020-11-05

## 2020-04-05 NOTE — Telephone Encounter (Signed)
-----   Message from Caren Macadam, MD sent at 03/21/2020  7:26 PM EDT ----- I sent her message, but I am not sure she checks my chart.  I wanted to see if she ever completed the cardiac echo that was ordered?  She had mentioned history of murmur, and I did hear 1 on exam.  I do feel like this would be good for [place] to obtain.  If she had it completed please see if you can get results.  If she has not, please reorder 2D echo with diagnosis of murmur.

## 2020-04-05 NOTE — Telephone Encounter (Signed)
Patient called back and was informed of the message below.  Patient stated she does not recall having an echo, is aware the order will be entered by Dr Ethlyn Gallery and someone will call with appt info.  Message sent to PCP due to questions needed for order.

## 2020-04-05 NOTE — Addendum Note (Signed)
Addended by: Agnes Lawrence on: 04/05/2020 04:12 PM   Modules accepted: Orders

## 2020-04-07 ENCOUNTER — Other Ambulatory Visit: Payer: Self-pay | Admitting: Family Medicine

## 2020-04-07 DIAGNOSIS — R011 Cardiac murmur, unspecified: Secondary | ICD-10-CM

## 2020-04-07 NOTE — Telephone Encounter (Signed)
Order placed

## 2020-04-28 ENCOUNTER — Ambulatory Visit (HOSPITAL_COMMUNITY): Payer: Medicaid Other | Attending: Cardiovascular Disease

## 2020-04-28 ENCOUNTER — Other Ambulatory Visit: Payer: Self-pay

## 2020-04-28 DIAGNOSIS — R011 Cardiac murmur, unspecified: Secondary | ICD-10-CM | POA: Insufficient documentation

## 2020-04-28 LAB — ECHOCARDIOGRAM COMPLETE
AR max vel: 0.64 cm2
AV Area VTI: 0.69 cm2
AV Area mean vel: 0.71 cm2
AV Mean grad: 33.2 mmHg
AV Peak grad: 65 mmHg
Ao pk vel: 4.03 m/s
Area-P 1/2: 4.52 cm2
P 1/2 time: 398 msec
S' Lateral: 2.5 cm

## 2020-06-23 ENCOUNTER — Other Ambulatory Visit: Payer: Self-pay | Admitting: Family Medicine

## 2020-07-07 ENCOUNTER — Ambulatory Visit: Payer: Medicaid Other | Admitting: Family Medicine

## 2020-07-07 ENCOUNTER — Encounter: Payer: Self-pay | Admitting: Family Medicine

## 2020-07-07 ENCOUNTER — Other Ambulatory Visit: Payer: Self-pay

## 2020-07-07 VITALS — BP 138/80 | HR 85 | Temp 98.5°F | Wt 158.8 lb

## 2020-07-07 DIAGNOSIS — E559 Vitamin D deficiency, unspecified: Secondary | ICD-10-CM

## 2020-07-07 DIAGNOSIS — R350 Frequency of micturition: Secondary | ICD-10-CM

## 2020-07-07 DIAGNOSIS — E039 Hypothyroidism, unspecified: Secondary | ICD-10-CM | POA: Diagnosis not present

## 2020-07-07 DIAGNOSIS — N3001 Acute cystitis with hematuria: Secondary | ICD-10-CM

## 2020-07-07 DIAGNOSIS — R413 Other amnesia: Secondary | ICD-10-CM | POA: Diagnosis not present

## 2020-07-07 LAB — POCT URINALYSIS DIPSTICK
Bilirubin, UA: NEGATIVE
Glucose, UA: NEGATIVE
Ketones, UA: NEGATIVE
Leukocytes, UA: NEGATIVE
Nitrite, UA: POSITIVE
Protein, UA: NEGATIVE
Spec Grav, UA: 1.015 (ref 1.010–1.025)
Urobilinogen, UA: 0.2 E.U./dL
pH, UA: 6.5 (ref 5.0–8.0)

## 2020-07-07 MED ORDER — SULFAMETHOXAZOLE-TRIMETHOPRIM 800-160 MG PO TABS: 1.0000 | ORAL_TABLET | Freq: Two times a day (BID) | ORAL | 0 refills | Status: DC

## 2020-07-07 NOTE — Addendum Note (Signed)
Addended by: Marrion Coy on: 07/07/2020 02:31 PM   Modules accepted: Orders

## 2020-07-07 NOTE — Progress Notes (Addendum)
[name] [name] DOB: Jun 13, 1956 Encounter date: 07/07/2020  This is a 64 y.o. female who presents with Chief Complaint  Patient presents with  . Pain    patient complains of pelvic pain x2 weeks, denies vaginal discharge  . Memory Loss  . Back Pain  . Urinary Frequency    History of present illness: Feeling like she has infection. Just feels bad. Not sleeping and when she does she isn't rested at all. Just hasn't felt well for a couple of months.   Worried about memory; maybe because she isn't sleeping. Forgot 4-number passcode to bank account that she has used for years; just scared her. Has had other little memory impairments - like forgetting what she went into room for.   Sometimes discomfort with urination. Has sometimes needed to use heating pad to help with discomfort. Has some lower back pain on both sides. Not had fevers, but usually runs low with temp. Had some night sweats a couple of weeks ago; feeling particularly bad then.   No blood in urine, no vaginal discharge. Gets constipated easily, and uses linzess just occasionally, but hasn't been taking it regularly due to not wanting to go to bathroom.  Not sleeping well. Waking very frequently through night. Can't turn mind off. Sometimes it is ache or pain, but sometimes just can't turn off mind. Not taking any medication for the bladder now. Used to take azo in past, but this made her sick.   With anti-depressant she does tend to tolerate her pain better. She feels this has made a difference, just that recently not feeling as well. Will have crying spells if she misses dose.   Allergies  Allergen Reactions  . Ciprofloxacin Itching and Palpitations    migraine  . Macrobid [Nitrofurantoin Macrocrystal] Other (See Comments)    Body aches and Migraine headache  . Ampicillin Other (See Comments)    Told by md not to take ampilciian due to penicillin allergy  . Atorvastatin Other (See Comments)    Felt like had flu  .  Buspirone Hcl Other (See Comments)    REACTION: intolerance  . Ezetimibe-Simvastatin Other (See Comments)    Unknown  . Latex Other (See Comments)    Redness and burning  . Lyrica [Pregabalin] Other (See Comments)    Caused swelling in legs and hands  . Penicillins Other (See Comments)    Fever and itching  . Sulfa Antibiotics Nausea Only   Current Meds  Medication Sig  . amphetamine-dextroamphetamine (ADDERALL) 30 MG tablet Take 15 mg by mouth daily as needed (for attention).   . BELBUCA 600 MCG FILM Take 1 strip by mouth 2 (two) times daily.  . cyclobenzaprine (FLEXERIL) 10 MG tablet Take 10 mg by mouth 4 (four) times daily as needed.  . diclofenac sodium (VOLTAREN) 1 % GEL Apply 4 g topically 4 (four) times daily.   Marland Kitchen docusate sodium (DULCOLAX) 100 MG capsule Take 200 mg by mouth daily as needed (for constipation).   . hydrOXYzine (ATARAX/VISTARIL) 25 MG tablet TAKE 1 TABLET BY MOUTH TWICE DAILY AS NEEDED FOR ITCHING  . ondansetron (ZOFRAN) 8 MG tablet Take 1 tablet (8 mg total) by mouth every 4 (four) hours as needed for nausea or vomiting.  . ondansetron (ZOFRAN-ODT) 4 MG disintegrating tablet DISSOLVE 1 TABLET BY MOUTH EVERY 8 HOURS AS NEEDED FOR NAUSEA  . oxyCODONE (OXY IR/ROXICODONE) 5 MG immediate release tablet Take 5 mg by mouth every 4 (four) hours as needed.  . pantoprazole (PROTONIX) 20  MG tablet Take 1 tablet (20 mg total) by mouth daily.  . polyethylene glycol powder (GLYCOLAX/MIRALAX) powder Take 17 g by mouth daily as needed for mild constipation or moderate constipation.  . promethazine (PHENERGAN) 25 MG suppository UNWRAP AND INSERT ONE SUPPOSITORY RECTALLY EVERY 6 HOURS AS NEEDED FOR NAUSEA.  . Vilazodone HCl (VIIBRYD) 40 MG TABS Take 0.5 tablets (20 mg total) by mouth at bedtime.    Review of Systems  Constitutional: Negative for chills, fatigue and fever.  Respiratory: Negative for cough, chest tightness, shortness of breath and wheezing.   Cardiovascular:  Negative for chest pain, palpitations and leg swelling.    Objective:  BP 138/80 (BP Location: Left Arm, Patient Position: Sitting, Cuff Size: Normal)   Pulse 85   Temp 98.5 F (36.9 C) (Oral)   Wt 158 lb 12.8 oz (72 kg)   BMI 27.69 kg/m   Weight: 158 lb 12.8 oz (72 kg)   BP Readings from Last 3 Encounters:  07/07/20 138/80  03/19/20 (!) 102/58  07/17/18 98/65   Wt Readings from Last 3 Encounters:  07/07/20 158 lb 12.8 oz (72 kg)  03/19/20 159 lb 4.8 oz (72.3 kg)  07/17/18 149 lb (67.6 kg)    Physical Exam Constitutional:      General: She is not in acute distress.    Appearance: She is well-developed.  Cardiovascular:     Rate and Rhythm: Normal rate and regular rhythm.     Heart sounds: Murmur heard.  Crescendo systolic murmur is present with a grade of 3/6.  No friction rub.  Pulmonary:     Effort: Pulmonary effort is normal. No respiratory distress.     Breath sounds: Normal breath sounds. No wheezing or rales.  Abdominal:     General: Abdomen is flat. Bowel sounds are normal.     Tenderness: There is abdominal tenderness in the suprapubic area. There is right CVA tenderness and left CVA tenderness.  Musculoskeletal:     Right lower leg: No edema.     Left lower leg: No edema.  Neurological:     Mental Status: She is alert and oriented to person, place, and time.  Psychiatric:        Behavior: Behavior normal.     Assessment/Plan  1. Urinary frequency Nitrate +, trace blood. Will treat with bactrim (listed as allergy but she can tolerate better than others); will send for culture. - POC Urinalysis Dipstick - sulfamethoxazole-trimethoprim (BACTRIM DS) 800-160 MG tablet; Take 1 tablet by mouth 2 (two) times daily.  Dispense: 10 tablet; Refill: 0  2. Acute cystitis with hematuria See above - sulfamethoxazole-trimethoprim (BACTRIM DS) 800-160 MG tablet; Take 1 tablet by mouth 2 (two) times daily.  Dispense: 10 tablet; Refill: 0 - CBC with  Differential/Platelet; Future - Comprehensive metabolic panel; Future  3. Hypothyroidism, unspecified type labwork today. She is not feeling well and may have thyroid issue on top of UTI.  - TSH; Future - T4, free; Future - T3, free; Future  4. Vitamin D deficiency - VITAMIN D 25 Hydroxy (Vit-D Deficiency, Fractures); Future  5. Memory change - we discussed working on treating infection first, rechecking bloodwork since thyroid issues/lack of sleep can affect memory. Further eval pending results/progress.   Return for pending bloodwork, urine culture .  *note addended to add murmur on exam; this murmur was present on previous visits and noted/substantial to warrant echo.     Micheline Rough, MD

## 2020-07-08 ENCOUNTER — Other Ambulatory Visit: Payer: Medicaid Other

## 2020-07-08 LAB — COMPREHENSIVE METABOLIC PANEL
AG Ratio: 1.8 (calc) (ref 1.0–2.5)
ALT: 14 U/L (ref 6–29)
AST: 17 U/L (ref 10–35)
Albumin: 4.4 g/dL (ref 3.6–5.1)
Alkaline phosphatase (APISO): 91 U/L (ref 37–153)
BUN/Creatinine Ratio: 7 (calc) (ref 6–22)
BUN: 8 mg/dL (ref 7–25)
CO2: 28 mmol/L (ref 20–32)
Calcium: 9.1 mg/dL (ref 8.6–10.4)
Chloride: 102 mmol/L (ref 98–110)
Creat: 1.12 mg/dL — ABNORMAL HIGH (ref 0.50–0.99)
Globulin: 2.5 g/dL (calc) (ref 1.9–3.7)
Glucose, Bld: 86 mg/dL (ref 65–99)
Potassium: 4.4 mmol/L (ref 3.5–5.3)
Sodium: 140 mmol/L (ref 135–146)
Total Bilirubin: 0.5 mg/dL (ref 0.2–1.2)
Total Protein: 6.9 g/dL (ref 6.1–8.1)

## 2020-07-08 LAB — CBC WITH DIFFERENTIAL/PLATELET
Absolute Monocytes: 584 {cells}/uL (ref 200–950)
Basophils Absolute: 58 {cells}/uL (ref 0–200)
Basophils Relative: 0.8 %
Eosinophils Absolute: 197 {cells}/uL (ref 15–500)
Eosinophils Relative: 2.7 %
HCT: 45 % (ref 35.0–45.0)
Hemoglobin: 15.5 g/dL (ref 11.7–15.5)
Lymphs Abs: 2898 {cells}/uL (ref 850–3900)
MCH: 32.2 pg (ref 27.0–33.0)
MCHC: 34.4 g/dL (ref 32.0–36.0)
MCV: 93.4 fL (ref 80.0–100.0)
MPV: 9.3 fL (ref 7.5–12.5)
Monocytes Relative: 8 %
Neutro Abs: 3562 {cells}/uL (ref 1500–7800)
Neutrophils Relative %: 48.8 %
Platelets: 341 10*3/uL (ref 140–400)
RBC: 4.82 Million/uL (ref 3.80–5.10)
RDW: 12.2 % (ref 11.0–15.0)
Total Lymphocyte: 39.7 %
WBC: 7.3 10*3/uL (ref 3.8–10.8)

## 2020-07-08 LAB — T4, FREE: Free T4: 1.1 ng/dL (ref 0.8–1.8)

## 2020-07-08 LAB — TSH: TSH: 2.45 mIU/L (ref 0.40–4.50)

## 2020-07-08 LAB — VITAMIN D 25 HYDROXY (VIT D DEFICIENCY, FRACTURES): Vit D, 25-Hydroxy: 15 ng/mL — ABNORMAL LOW (ref 30–100)

## 2020-07-08 LAB — T3, FREE: T3, Free: 3.1 pg/mL (ref 2.3–4.2)

## 2020-07-13 MED ORDER — VITAMIN D (ERGOCALCIFEROL) 1.25 MG (50000 UNIT) PO CAPS: 50000.0000 [IU] | ORAL_CAPSULE | ORAL | 1 refills | Status: DC

## 2020-07-13 NOTE — Addendum Note (Signed)
Addended by: Agnes Lawrence on: 07/13/2020 11:25 AM   Modules accepted: Orders

## 2020-07-14 ENCOUNTER — Encounter: Payer: Self-pay | Admitting: Internal Medicine

## 2020-07-14 ENCOUNTER — Ambulatory Visit (INDEPENDENT_AMBULATORY_CARE_PROVIDER_SITE_OTHER): Payer: Medicaid Other | Admitting: Internal Medicine

## 2020-07-14 ENCOUNTER — Other Ambulatory Visit: Payer: Self-pay

## 2020-07-14 ENCOUNTER — Other Ambulatory Visit (INDEPENDENT_AMBULATORY_CARE_PROVIDER_SITE_OTHER): Payer: Medicaid Other

## 2020-07-14 VITALS — BP 136/80 | HR 94 | Ht 64.0 in | Wt 159.4 lb

## 2020-07-14 DIAGNOSIS — I350 Nonrheumatic aortic (valve) stenosis: Secondary | ICD-10-CM | POA: Diagnosis not present

## 2020-07-14 DIAGNOSIS — R350 Frequency of micturition: Secondary | ICD-10-CM | POA: Diagnosis not present

## 2020-07-14 DIAGNOSIS — E785 Hyperlipidemia, unspecified: Secondary | ICD-10-CM

## 2020-07-14 DIAGNOSIS — R011 Cardiac murmur, unspecified: Secondary | ICD-10-CM | POA: Diagnosis not present

## 2020-07-14 MED ORDER — ROSUVASTATIN CALCIUM 20 MG PO TABS: 20.0000 mg | ORAL_TABLET | Freq: Every day | ORAL | 3 refills | Status: DC

## 2020-07-14 NOTE — Patient Instructions (Addendum)
Medication Instructions:  START crestor 20mg  daily  *If you need a refill on your cardiac medications before your next appointment, please call your pharmacy*   Lab Work: FASTING labs in 3 months (before your next visit with Dr. Debara Pickett)  If you have labs (blood work) drawn today and your tests are completely normal, you will receive your results only by: Marland Kitchen MyChart Message (if you have MyChart) OR . A paper copy in the mail If you have any lab test that is abnormal or we need to change your treatment, we will call you to review the results.   Testing/Procedures: Echocardiogram @ [address] - 3rd Floor (due August 2022)   Follow-Up: At [facility], you and your health needs are our priority.  As part of our continuing mission to provide you with exceptional heart care, we have created designated Provider Care Teams.  These Care Teams include your primary Cardiologist (physician) and Advanced Practice Providers (APPs -  Physician Assistants and Nurse Practitioners) who all work together to provide you with the care you need, when you need it.  We recommend signing up for the patient portal called "MyChart".  Sign up information is provided on this After Visit Summary.  MyChart is used to connect with patients for Virtual Visits (Telemedicine).  Patients are able to view lab/test results, encounter notes, upcoming appointments, etc.  Non-urgent messages can be sent to your provider as well.   To learn more about what you can do with MyChart, go to [email or web address].    Your next appointment:   3 month(s) - lipid clinic  The format for your next appointment:   In Person  Provider:   K. Mali Hilty, MD   Other Instructions

## 2020-07-14 NOTE — Progress Notes (Signed)
OFFICE CONSULT NOTE  Chief Complaint:  Dyslipidemia, murmur  Primary Care Physician: Caren Macadam, MD  HPI:  [name] [name] is a 64 y.o. female who is being seen today for the evaluation of dyslipidemia at the request of Koberlein, Steele Berg, MD.  This is a pleasant 64 year old female kindly referred for evaluation and management of dyslipidemia.  She was sent to the lipid clinic for persistently elevated cholesterol.  More recently in June her total cholesterol is 274, HDL 45, LDL 183 and triglycerides 211.  Hemoglobin A1c had been 5.1.  In the past she has tried statins including atorvastatin which caused her flulike symptoms and Vytorin which caused some unknown side effects.  She is not currently on any therapies.  She does report a significant history of heart disease in her family including her mother (who died at age 23 of heart disease) and her sisters.  She reported a younger brother who had what sounds like an enlarged heart.  She also notes that she has had a murmur since she was a child.  She was told at age 1 that she had a heart murmur which almost kept her from getting a job that she wanted.  Since then she has had very little work-up about this.  According to her PCPs notes recently the patient told her she had a murmur and it apparently was not auscultated.  An echocardiogram was ordered and performed in August.  I personally reviewed these images along with the report which does demonstrate significant calcification of the aortic valve and what appears to possibly be a bicuspid valve with 2 fused raphe.  There is probably moderate aortic stenosis with a mean gradient of 22 mmHg and mild to moderate aortic insufficiency.  When I discussed these echo findings with her today she told me that she was either not told of this or did not recall knowing about the echo findings.  PMHx:  Past Medical History:  Diagnosis Date  . ABDOMINAL PAIN, LOWER 02/16/2009  . APPENDECTOMY, HX OF  09/11/2007  . ATTENTION DEFICIT DISORDER, ADULT 02/02/2009  . BACK PAIN 07/16/2007   chronic  . Back pain    spinal stimulator in place  . CERVICAL RADICULOPATHY, RIGHT 07/16/2007  . CHEST PAIN, ACUTE 09/03/2008  . Chronic abdominal pain   . Chronic pain syndrome 09/11/2007  . Complication of anesthesia    woke up during surgeries few times and woke up during endoscopy and colonscopy few weeks ago, in terrible pain  . DDD (degenerative disc disease), lumbosacral   . DEPRESSION/ANXIETY 06/17/2010  . Dysuria 02/20/2008  . Elevated liver enzymes 1 and  1/2 months ago  . Family history of anesthesia complication    mother woke up during surgeries  . Fibromyalgia   . Functional GI symptoms   . FX, RAMUS NOS, CLOSED 07/16/2007  . GERD (gastroesophageal reflux disease)   . H/O failed conscious sedation    PT STATES SHE IS HARD TO SEDATE!  . HEMORRHOIDS 09/11/2007  . HIATAL HERNIA, HX OF 09/11/2007  . History of kidney stones   . HX, PERSONAL, MUSCULOSKELETAL DISORD NEC 07/16/2007  . HX, PERSONAL, URINARY CALCULI 07/16/2007  . Irritable bowel syndrome 09/11/2007  . NEOPLASM, SKIN, UNCERTAIN BEHAVIOR 03/28/813  . OSTEOARTHRITIS 09/11/2007   oa  . OSTEOPENIA 10/23/2007  . PONV (postoperative nausea and vomiting)   . ROTATOR CUFF REPAIR, HX OF 09/11/2007  . Suicidal ideations   . SYMPTOM, PAIN, ABDOMINAL, RIGHT UP QUADRANT 07/16/2007  .  TAH/BSO, HX OF 09/11/2007  . UTI 10/06/2010  . VAGINITIS, ATROPHIC, POSTMENOPAUSAL 07/16/2007    Past Surgical History:  Procedure Laterality Date  . ABDOMINAL HYSTERECTOMY  1976   removed due to abnormal paps; no uterine cancer  . APPENDECTOMY  1969  . BILATERAL OOPHORECTOMY  1993  . bone pushed back into  place after fracture Left 25-30 yrs ago   face  . cervical radiculopathy  20 yrs ago   RT  . CHOLECYSTECTOMY N/A 02/18/2014   Procedure: LAPAROSCOPIC CHOLECYSTECTOMY;  Surgeon: Harl Bowie, MD;  Location: [facility];  Service: General;   Laterality: N/A;  . COLONOSCOPY    . DILATION AND CURETTAGE OF UTERUS  age 58  . ESOPHAGOGASTRODUODENOSCOPY    . ORIF ANKLE FRACTURE Left 07/12/2017   Procedure: OPEN REDUCTION INTERNAL FIXATION (ORIF) LEFT ANKLE FRACTURE;  Surgeon: Carole Civil, MD;  Location: [facility];  Service: Orthopedics;  Laterality: Left;  . [place]   right  . SPINAL CORD STIMULATOR IMPLANT  2005   lower back, pt turned off 2-3 weeks ago due to causing pain  . TUBAL LIGATION      FAMHx:  Family History  Problem Relation Age of Onset  . Heart disease Mother        CAD/ valvular disease  . Kidney disease Mother        lost a kidney -  unknown cause  . COPD Mother   . High Cholesterol Brother        CAD  . High blood pressure Brother   . COPD Father   . Heart disease Father 29       MI  . Heart disease Sister 58  . Other Sister        complications from pain medications  . Emphysema Sister   . Cancer Other        Colon  . Heart disease Maternal Aunt   . Heart disease Maternal Grandmother   . Colon cancer Cousin 66       paternal  . Stroke Maternal Grandfather   . Alcoholism Brother   . Brain cancer Maternal Aunt     SOCHx:   reports that she has never smoked. She has never used smokeless tobacco. She reports that she does not drink alcohol and does not use drugs.  ALLERGIES:  Allergies  Allergen Reactions  . Ciprofloxacin Itching and Palpitations    migraine  . Macrobid [Nitrofurantoin Macrocrystal] Other (See Comments)    Body aches and Migraine headache  . Ampicillin Other (See Comments)    Told by md not to take ampilciian due to penicillin allergy  . Atorvastatin Other (See Comments)    Felt like had flu  . Buspirone Hcl Other (See Comments)    REACTION: intolerance  . Ezetimibe-Simvastatin Other (See Comments)    Unknown  . Latex Other (See Comments)    Redness and burning  . Lyrica [Pregabalin] Other (See Comments)    Caused swelling in legs and hands  .  Penicillins Other (See Comments)    Fever and itching  . Sulfa Antibiotics Nausea Only    ROS: Pertinent items noted in HPI and remainder of comprehensive ROS otherwise negative.  HOME MEDS: Current Outpatient Medications on File Prior to Visit  Medication Sig Dispense Refill  . amphetamine-dextroamphetamine (ADDERALL) 30 MG tablet Take 15 mg by mouth daily as needed (for attention).     . cyclobenzaprine (FLEXERIL) 10 MG tablet Take 10 mg by  mouth 4 (four) times daily as needed.    . diclofenac sodium (VOLTAREN) 1 % GEL Apply 4 g topically 4 (four) times daily.     Marland Kitchen docusate sodium (DULCOLAX) 100 MG capsule Take 200 mg by mouth daily as needed (for constipation).     . hydrOXYzine (ATARAX/VISTARIL) 25 MG tablet TAKE 1 TABLET BY MOUTH TWICE DAILY AS NEEDED FOR ITCHING    . ondansetron (ZOFRAN-ODT) 4 MG disintegrating tablet DISSOLVE 1 TABLET BY MOUTH EVERY 8 HOURS AS NEEDED FOR NAUSEA 90 tablet 0  . oxyCODONE (OXY IR/ROXICODONE) 5 MG immediate release tablet Take 5 mg by mouth every 4 (four) hours as needed.    . pantoprazole (PROTONIX) 20 MG tablet Take 1 tablet (20 mg total) by mouth daily. 30 tablet 0  . promethazine (PHENERGAN) 25 MG suppository UNWRAP AND INSERT ONE SUPPOSITORY RECTALLY EVERY 6 HOURS AS NEEDED FOR NAUSEA. 12 suppository 0  . Vilazodone HCl (VIIBRYD) 40 MG TABS Take 0.5 tablets (20 mg total) by mouth at bedtime. 30 tablet 0  . Vitamin D, Ergocalciferol, (DRISDOL) 1.25 MG (50000 UNIT) CAPS capsule Take 1 capsule (50,000 Units total) by mouth every 7 (seven) days. 12 capsule 1  . gabapentin (NEURONTIN) 100 MG capsule Take 1 capsule (100 mg total) by mouth 3 (three) times daily. 90 capsule 2   No current facility-administered medications on file prior to visit.    LABS/IMAGING: No results found for this or any previous visit (from the past 48 hour(s)). No results found.  LIPID PANEL:    Component Value Date/Time   CHOL 274 (H) 03/19/2020 1137   TRIG 211.0 (H)  03/19/2020 1137   HDL 45.30 03/19/2020 1137   CHOLHDL 6 03/19/2020 1137   VLDL 42.2 (H) 03/19/2020 1137   LDLCALC 153 (H) 06/24/2018 1059   LDLDIRECT 183.0 03/19/2020 1137    WEIGHTS: Wt Readings from Last 3 Encounters:  07/14/20 159 lb 6.4 oz (72.3 kg)  07/07/20 158 lb 12.8 oz (72 kg)  03/19/20 159 lb 4.8 oz (72.3 kg)    VITALS: BP 136/80   Pulse 94   Ht 5\' 4"  (1.626 m)   Wt 159 lb 6.4 oz (72.3 kg)   SpO2 99%   BMI 27.36 kg/m   EXAM: General appearance: alert and no distress Neck: no carotid bruit, no JVD and thyroid not enlarged, symmetric, no tenderness/mass/nodules Lungs: clear to auscultation bilaterally Heart: regular rate and rhythm, S1, S2 normal, systolic murmur: systolic ejection 3/6, crescendo at 2nd right intercostal space and diastolic murmur: early diastolic 2/6, blowing at lower left sternal border Abdomen: soft, non-tender; bowel sounds normal; no masses,  no organomegaly Extremities: extremities normal, atraumatic, no cyanosis or edema Pulses: 2+ and symmetric Skin: Skin color, texture, turgor normal. No rashes or lesions Neurologic: Grossly normal Psych: Anxious  EKG: Deferred  ASSESSMENT: 1. Mixed dyslipidemia 2. Possible statin intolerance-myalgias 3. Aortic stenosis/insufficiency-suspect bicuspid or functionally bicuspid aortic valve   PLAN: 1.   Ms. Cinquemani was referred for a mixed dyslipidemia and has been statin intolerant, but has not yet been trialed on rosuvastatin.  I did recommend starting 20 mg of rosuvastatin daily.  We will repeat lipids in 3 months if tolerated.  Additionally, she was found to have what appears to be aortic stenosis that is calcific and may have a bicuspid or functionally bicuspid valve.  The degree of stenosis is at least moderate with mild to moderate aortic insufficiency.  This will need to be followed closely with a repeat echo  in a year.  It does sound like there is a family history of heart disease.  In addition with  her high cholesterol she could have a high LP(a), which is associated with aortic valve disease as well.  Other newer treatments may be available for this if it is elevated.  I will continue to follow her clinically both for her valvular heart disease and dyslipidemia.  Thanks again for the kind referral.  Pixie Casino, MD, FACC, [facility] Director of the [facility] Diplomate of the American Board of Clinical Lipidology Attending Cardiologist  Direct Dial: [phone]  Fax: [phone]  Website:  [email or web address] 07/14/2020, 1:13 PM

## 2020-07-15 LAB — URINE CULTURE
MICRO NUMBER:: 11096504
Result:: NO GROWTH
SPECIMEN QUALITY:: ADEQUATE

## 2020-07-28 NOTE — Addendum Note (Signed)
Addended by: Agnes Lawrence on: 07/28/2020 04:55 PM   Modules accepted: Orders

## 2020-08-13 ENCOUNTER — Other Ambulatory Visit: Payer: Self-pay | Admitting: Family Medicine

## 2020-09-02 ENCOUNTER — Telehealth: Payer: Self-pay | Admitting: Family Medicine

## 2020-09-02 NOTE — Telephone Encounter (Signed)
pt need Rx for  air-activated heating  patches   non medicated  [place], [place] [facility] [place] Phone:  [phone] Fax:  [phone]

## 2020-09-02 NOTE — Telephone Encounter (Signed)
We will need diagnosis for this and I am ok with calling in if insurance covers this. Just check with her for frequency of use; typically those are once or twice daily.

## 2020-09-03 MED ORDER — HEATING PAD PADS: MEDICATED_PAD | 0 refills | Status: AC

## 2020-09-03 NOTE — Telephone Encounter (Signed)
Spoke with the pt and she stated she needs the patches for low back pain and uses these three times a week.  Patient was advised the Rx was sent to [facility] as below and we do not know if this is covered by her insurance or not.

## 2020-09-28 DIAGNOSIS — N390 Urinary tract infection, site not specified: Secondary | ICD-10-CM | POA: Diagnosis not present

## 2020-09-28 DIAGNOSIS — R351 Nocturia: Secondary | ICD-10-CM | POA: Diagnosis not present

## 2020-09-28 DIAGNOSIS — N3946 Mixed incontinence: Secondary | ICD-10-CM | POA: Diagnosis not present

## 2020-09-28 DIAGNOSIS — R350 Frequency of micturition: Secondary | ICD-10-CM | POA: Diagnosis not present

## 2020-10-04 ENCOUNTER — Other Ambulatory Visit: Payer: Self-pay | Admitting: Family Medicine

## 2020-10-05 NOTE — Telephone Encounter (Signed)
We should be able to just do OTC vitamin D at this point - 2000 units daily since she completed the 50,000 weekly rx previously sent in. She should no longer need the high dose.

## 2020-10-07 ENCOUNTER — Telehealth: Payer: Medicaid Other | Admitting: Internal Medicine

## 2020-10-15 ENCOUNTER — Other Ambulatory Visit: Payer: Self-pay | Admitting: Family Medicine

## 2020-10-19 DIAGNOSIS — M25562 Pain in left knee: Secondary | ICD-10-CM | POA: Diagnosis not present

## 2020-10-20 DIAGNOSIS — R351 Nocturia: Secondary | ICD-10-CM | POA: Diagnosis not present

## 2020-10-20 DIAGNOSIS — N3946 Mixed incontinence: Secondary | ICD-10-CM | POA: Diagnosis not present

## 2020-10-20 DIAGNOSIS — R350 Frequency of micturition: Secondary | ICD-10-CM | POA: Diagnosis not present

## 2020-10-20 DIAGNOSIS — N139 Obstructive and reflux uropathy, unspecified: Secondary | ICD-10-CM | POA: Diagnosis not present

## 2020-10-26 ENCOUNTER — Telehealth (INDEPENDENT_AMBULATORY_CARE_PROVIDER_SITE_OTHER): Payer: Medicaid Other | Admitting: Family Medicine

## 2020-10-26 DIAGNOSIS — F419 Anxiety disorder, unspecified: Secondary | ICD-10-CM

## 2020-10-26 NOTE — Progress Notes (Signed)
Virtual Visit via Telephone Note  I connected with [name] [name] on 10/26/20 at  6:00 PM EST by telephone and verified that I am speaking with the correct person using two identifiers.   I discussed the limitations, risks, security and privacy concerns of performing an evaluation and management service by telephone and the availability of in person appointments. I also discussed with the patient that there may be a patient responsible charge related to this service. The patient expressed understanding and agreed to proceed.  Location patient: home, [name] Location provider: work or home office Participants present for the call: patient, provider Patient did not have a visit with me in the prior 7 days to address this/these issue(s).   History of Present Illness:  Acute telemedicine visit for bowel concerns: -reports chronic paranoia/fear of worms - she sees psychiatry for this and they said there is nothing else they can do for her unless she touches a worm, which she reports she would never do -in the past,  she thinks she accidentally ate her soup out of one of the paper bowls she fed her dog in -she had fed her dog human grade meat several months ago that she bought at the grocery store and she is worried if her dogs could have gotten worms from the meat and if she could get worms -she is not having any symptoms, her dog is currently fine and the vet checked her dog and said did not have worms and patient has not seen any worms, but is asking for worm treatment -reports her dog is check frequently by vet and is up to date on care    Observations/Objective: Patient sounds cheerful and well on the phone. I do not appreciate any SOB. Speech and thought processing are grossly intact. Patient reported vitals:  Assessment and Plan:  Anxiety  -we discussed possible serious and likely etiologies, options for evaluation and workup, limitations of telemedicine visit vs in person visit,  treatment, treatment risks and precautions. Pt prefers to treat via telemedicine empirically rather than in person at this moment.  Patient currently without symptoms.  Did discuss that it is unlikely she has worms.  Discussed signs and symptoms of worms and advised that she follow-up with her primary care doctor if any symptoms should develop.  Did also advised that she follow-up with her psychiatrist to see if there is any counseling that could be done to help her with the anxiety regarding the situation.  She reports she already has had follow-up with psychiatry about this, is on medications and is followed closely.  Advised follow-up with primary care doctor for any further questions or concerns or any symptoms develop.  Follow Up Instructions:  I did not refer this patient for an OV with me in the next 24 hours for this/these issue(s).  I discussed the assessment and treatment plan with the patient. The patient was provided an opportunity to ask questions and all were answered. The patient agreed with the plan and demonstrated an understanding of the instructions.   I spent 20 minutes on the date of this visit in the care of this patient. See summary of tasks completed to properly care for this patient in the detailed notes above which also included counseling of above, review of PMH, medications, allergies, evaluation of the patient and ordering and/or  instructing patient on testing and care options.     [name] Kern, DO

## 2020-11-03 DIAGNOSIS — E785 Hyperlipidemia, unspecified: Secondary | ICD-10-CM | POA: Diagnosis not present

## 2020-11-04 LAB — LIPOPROTEIN A (LPA): Lipoprotein (a): <8.4 nmol/L (ref <75.0)

## 2020-11-04 LAB — LIPID PANEL
Chol/HDL Ratio: 2.5 ratio (ref 0.0–4.4)
Cholesterol, Total: 151 mg/dL (ref 100–199)
HDL: 60 mg/dL (ref >39)
LDL Chol Calc (NIH): 67 mg/dL (ref 0–99)
Triglycerides: 139 mg/dL (ref 0–149)
VLDL Cholesterol Cal: 24 mg/dL (ref 5–40)

## 2020-11-05 ENCOUNTER — Telehealth (INDEPENDENT_AMBULATORY_CARE_PROVIDER_SITE_OTHER): Payer: Medicaid Other | Admitting: Internal Medicine

## 2020-11-05 ENCOUNTER — Encounter: Payer: Self-pay | Admitting: Internal Medicine

## 2020-11-05 DIAGNOSIS — R0600 Dyspnea, unspecified: Secondary | ICD-10-CM | POA: Diagnosis not present

## 2020-11-05 DIAGNOSIS — R0609 Other forms of dyspnea: Secondary | ICD-10-CM

## 2020-11-05 DIAGNOSIS — E785 Hyperlipidemia, unspecified: Secondary | ICD-10-CM | POA: Diagnosis not present

## 2020-11-05 DIAGNOSIS — I350 Nonrheumatic aortic (valve) stenosis: Secondary | ICD-10-CM | POA: Diagnosis not present

## 2020-11-05 NOTE — Progress Notes (Signed)
Virtual Visit via Telephone Note   This visit type was conducted due to national recommendations for restrictions regarding the COVID-19 Pandemic (e.g. social distancing) in an effort to limit this patient's exposure and mitigate transmission in our community.  Due to her co-morbid illnesses, this patient is at least at moderate risk for complications without adequate follow up.  This format is felt to be most appropriate for this patient at this time.  The patient did not have access to video technology/had technical difficulties with video requiring transitioning to audio format only (telephone).  All issues noted in this document were discussed and addressed.  No physical exam could be performed with this format.  Please refer to the patient's chart for her  consent to telehealth for [facility].   Date:  11/05/2020   ID:  [name] [name], DOB [birth year], MRN [id number] The patient was identified using 2 identifiers.  Evaluation Performed:  Follow-Up Visit  Patient Location:  [place] [address]  Provider location:   [address] [place], [place] [address]  PCP:  Caren Macadam, MD  Cardiologist:  No primary care provider on file. Electrophysiologist:  None   Chief Complaint: Follow-up dyslipidemia  History of Present Illness:    [name] [name] is a 65 y.o. female who presents via audio/video conferencing for a telehealth visit today.  This is a pleasant 65 year old female kindly referred for evaluation and management of dyslipidemia.  She was sent to the lipid clinic for persistently elevated cholesterol.  More recently in June her total cholesterol is 274, HDL 45, LDL 183 and triglycerides 211.  Hemoglobin A1c had been 5.1.  In the past she has tried statins including atorvastatin which caused her flulike symptoms and Vytorin which caused some unknown side effects.  She is not currently on any therapies.  She does report a significant history of  heart disease in her family including her mother (who died at age 26 of heart disease) and her sisters.  She reported a younger brother who had what sounds like an enlarged heart.  She also notes that she has had a murmur since she was a child.  She was told at age 39 that she had a heart murmur which almost kept her from getting a job that she wanted.  Since then she has had very little work-up about this.  According to her PCPs notes recently the patient told her she had a murmur and it apparently was not auscultated.  An echocardiogram was ordered and performed in August.  I personally reviewed these images along with the report which does demonstrate significant calcification of the aortic valve and what appears to possibly be a bicuspid valve with 2 fused raphe.  There is probably moderate aortic stenosis with a mean gradient of 22 mmHg and mild to moderate aortic insufficiency.  When I discussed these echo findings with her today she told me that she was either not told of this or did not recall knowing about the echo findings.  11/05/2020  [name] [name] returns today for follow-up of dyslipidemia.  She seems to be tolerating high potency rosuvastatin 20 mg daily.  He has had an excellent response to this.  Down to 151, triglycerides 139, HDL 60, up from 45 and LDL 67.  This puts her at target.  Unfortunately, she does report progressive symptoms of fatigue and shortness of breath.  She was noted to have moderately severe aortic stenosis and mild to moderate aortic insufficiency  by echo in August.  She is due actually now for repeat echo which was recommended in 6 months.  It is very possible she has had progressive aortic stenosis and may now need valve replacement.  The patient does not have symptoms concerning for COVID-19 infection (fever, chills, cough, or new SHORTNESS OF BREATH).    Prior CV studies:   The following studies were reviewed today:  Reviewed, lab work  PMHx:  Past Medical  History:  Diagnosis Date  . ABDOMINAL PAIN, LOWER 02/16/2009  . APPENDECTOMY, HX OF 09/11/2007  . ATTENTION DEFICIT DISORDER, ADULT 02/02/2009  . BACK PAIN 07/16/2007   chronic  . Back pain    spinal stimulator in place  . CERVICAL RADICULOPATHY, RIGHT 07/16/2007  . CHEST PAIN, ACUTE 09/03/2008  . Chronic abdominal pain   . Chronic pain syndrome 09/11/2007  . Complication of anesthesia    woke up during surgeries few times and woke up during endoscopy and colonscopy few weeks ago, in terrible pain  . DDD (degenerative disc disease), lumbosacral   . DEPRESSION/ANXIETY 06/17/2010  . Dysuria 02/20/2008  . Elevated liver enzymes 1 and  1/2 months ago  . Family history of anesthesia complication    mother woke up during surgeries  . Fibromyalgia   . Functional GI symptoms   . FX, RAMUS NOS, CLOSED 07/16/2007  . GERD (gastroesophageal reflux disease)   . H/O failed conscious sedation    PT STATES SHE IS HARD TO SEDATE!  . HEMORRHOIDS 09/11/2007  . HIATAL HERNIA, HX OF 09/11/2007  . History of kidney stones   . HX, PERSONAL, MUSCULOSKELETAL DISORD NEC 07/16/2007  . HX, PERSONAL, URINARY CALCULI 07/16/2007  . Irritable bowel syndrome 09/11/2007  . NEOPLASM, SKIN, UNCERTAIN BEHAVIOR 0/04/1447  . OSTEOARTHRITIS 09/11/2007   oa  . OSTEOPENIA 10/23/2007  . PONV (postoperative nausea and vomiting)   . ROTATOR CUFF REPAIR, HX OF 09/11/2007  . Suicidal ideations   . SYMPTOM, PAIN, ABDOMINAL, RIGHT UP QUADRANT 07/16/2007  . TAH/BSO, HX OF 09/11/2007  . UTI 10/06/2010  . VAGINITIS, ATROPHIC, POSTMENOPAUSAL 07/16/2007    Past Surgical History:  Procedure Laterality Date  . ABDOMINAL HYSTERECTOMY  1976   removed due to abnormal paps; no uterine cancer  . APPENDECTOMY  1969  . BILATERAL OOPHORECTOMY  1993  . bone pushed back into  place after fracture Left 25-30 yrs ago   face  . cervical radiculopathy  20 yrs ago   RT  . CHOLECYSTECTOMY N/A 02/18/2014   Procedure: LAPAROSCOPIC  CHOLECYSTECTOMY;  Surgeon: Harl Bowie, MD;  Location: [facility];  Service: General;  Laterality: N/A;  . COLONOSCOPY    . DILATION AND CURETTAGE OF UTERUS  age 71  . ESOPHAGOGASTRODUODENOSCOPY    . ORIF ANKLE FRACTURE Left 07/12/2017   Procedure: OPEN REDUCTION INTERNAL FIXATION (ORIF) LEFT ANKLE FRACTURE;  Surgeon: Carole Civil, MD;  Location: [facility];  Service: Orthopedics;  Laterality: Left;  . [place]   right  . SPINAL CORD STIMULATOR IMPLANT  2005   lower back, pt turned off 2-3 weeks ago due to causing pain  . TUBAL LIGATION      FAMHx:  Family History  Problem Relation Age of Onset  . Heart disease Mother        CAD/ valvular disease  . Kidney disease Mother        lost a kidney -  unknown cause  . COPD Mother   . High Cholesterol Brother  CAD  . High blood pressure Brother   . COPD Father   . Heart disease Father 67       MI  . Heart disease Sister 33  . Other Sister        complications from pain medications  . Emphysema Sister   . Cancer Other        Colon  . Heart disease Maternal Aunt   . Heart disease Maternal Grandmother   . Colon cancer Cousin 63       paternal  . Stroke Maternal Grandfather   . Alcoholism Brother   . Brain cancer Maternal Aunt     SOCHx:   reports that she has never smoked. She has never used smokeless tobacco. She reports that she does not drink alcohol and does not use drugs.  ALLERGIES:  Allergies  Allergen Reactions  . Ciprofloxacin Itching and Palpitations    migraine  . Macrobid [Nitrofurantoin Macrocrystal] Other (See Comments)    Body aches and Migraine headache  . Ampicillin Other (See Comments)    Told by md not to take ampilciian due to penicillin allergy  . Atorvastatin Other (See Comments)    Felt like had flu  . Buspirone Hcl Other (See Comments)    REACTION: intolerance  . Ezetimibe-Simvastatin Other (See Comments)    Unknown  . Latex Other (See Comments)    Redness and  burning  . Lyrica [Pregabalin] Other (See Comments)    Caused swelling in legs and hands  . Penicillins Other (See Comments)    Fever and itching  . Sulfa Antibiotics Nausea Only    MEDS:  Current Meds  Medication Sig  . amphetamine-dextroamphetamine (ADDERALL) 30 MG tablet Take 15 mg by mouth daily as needed (for attention).   . cyclobenzaprine (FLEXERIL) 10 MG tablet Take 10 mg by mouth 4 (four) times daily as needed.  . diclofenac sodium (VOLTAREN) 1 % GEL Apply 4 g topically 4 (four) times daily.   Marland Kitchen gabapentin (NEURONTIN) 100 MG capsule Take 1 capsule (100 mg total) by mouth 3 (three) times daily.  Marland Kitchen Heating Pad PADS Use 3 times weekly  . hydrOXYzine (ATARAX/VISTARIL) 25 MG tablet TAKE 1 TABLET BY MOUTH TWICE DAILY AS NEEDED FOR ITCHING  . ondansetron (ZOFRAN-ODT) 4 MG disintegrating tablet DISSOLVE 1 TABLET BY MOUTH EVERY 8 HOURS AS NEEDED FOR NAUSEA  . oxyCODONE (OXY IR/ROXICODONE) 5 MG immediate release tablet Take 5 mg by mouth every 4 (four) hours as needed.  . promethazine (PHENERGAN) 25 MG suppository UNWRAP AND INSERT ONE SUPPOSITORY RECTALLY EVERY 6 HOURS AS NEEDED FOR NAUSEA.  . rosuvastatin (CRESTOR) 20 MG tablet Take 1 tablet (20 mg total) by mouth daily.  Marland Kitchen VIIBRYD 40 MG TABS TAKE 1 TABLET BY MOUTH DAILY WITH FULL MEAL.  Marland Kitchen Vitamin D, Ergocalciferol, (DRISDOL) 1.25 MG (50000 UNIT) CAPS capsule Take 50,000 Units by mouth every 7 (seven) days.  . [DISCONTINUED] Vitamin D, Ergocalciferol, (DRISDOL) 1.25 MG (50000 UNIT) CAPS capsule Take 1 capsule (50,000 Units total) by mouth every 7 (seven) days.     ROS: Pertinent items noted in HPI and remainder of comprehensive ROS otherwise negative.  Labs/Other Tests and Data Reviewed:    Recent Labs: 07/07/2020: ALT 14; BUN 8; Creat 1.12; Hemoglobin 15.5; Platelets 341; Potassium 4.4; Sodium 140; TSH 2.45   Recent Lipid Panel Lab Results  Component Value Date/Time   CHOL 151 11/03/2020 03:46 PM   TRIG 139 11/03/2020 03:46  PM   HDL 60 11/03/2020 03:46 PM  CHOLHDL 2.5 11/03/2020 03:46 PM   CHOLHDL 6 03/19/2020 11:37 AM   LDLCALC 67 11/03/2020 03:46 PM   LDLDIRECT 183.0 03/19/2020 11:37 AM    Wt Readings from Last 3 Encounters:  07/14/20 159 lb 6.4 oz (72.3 kg)  07/07/20 158 lb 12.8 oz (72 kg)  03/19/20 159 lb 4.8 oz (72.3 kg)     Exam:    Vital Signs:  There were no vitals taken for this visit.   Exam not performed due to telephone visit  ASSESSMENT & PLAN:    1. Mixed dyslipidemia 2. Possible statin intolerance-myalgias 3. Aortic stenosis/insufficiency-suspect bicuspid or functionally bicuspid aortic valve  [name] [name] has had marked improvement in her dyslipidemia and now is at goal LDL less than 70 with generally a very normal lipid profile across the board.  Unfortunately, she still has some worsening symptoms including fatigue, shortness of breath and decreased energy levels which may be related to her aortic stenosis and insufficiency.  Echo showed she was moderately severe in August and a repeat echo is necessary at this time.  If her gradient has worsened or aortic insufficiency is worse, she will likely need referral for evaluation of aortic valve replacement.  I will contact her with those results and otherwise plan follow-up in 6 months or sooner as necessary.  COVID-19 Education: The signs and symptoms of COVID-19 were discussed with the patient and how to seek care for testing (follow up with PCP or arrange E-visit).  The importance of social distancing was discussed today.  Patient Risk:   After full review of this patients clinical status, I feel that they are at least moderate risk at this time.  Time:   Today, I have spent 25 minutes with the patient with telehealth technology discussing dyslipidemia, aortic stenosis/insufficiency.     Medication Adjustments/Labs and Tests Ordered: Current medicines are reviewed at length with the patient today.  Concerns regarding medicines are  outlined above.   Tests Ordered: Orders Placed This Encounter  Procedures  . ECHOCARDIOGRAM COMPLETE    Medication Changes: No orders of the defined types were placed in this encounter.   Disposition:  in 6 month(s)  Pixie Casino, MD, [facility], [place] Director of the Advanced Lipid Disorders &  Cardiovascular Risk Reduction Clinic Diplomate of the American Board of Clinical Lipidology Attending Cardiologist  Direct Dial: [phone]  Fax: [phone]  Website:  [email or web address]  Pixie Casino, MD  11/05/2020 9:48 AM

## 2020-11-05 NOTE — Patient Instructions (Signed)
Medication Instructions:  Your Physician recommend you continue on your current medication as directed.    *If you need a refill on your cardiac medications before your next appointment, please call your pharmacy*   Lab Work: None   Testing/Procedures: Your physician has requested that you have an echocardiogram. Echocardiography is a painless test that uses sound waves to create images of your heart. It provides your doctor with information about the size and shape of your heart and how well your heart's chambers and valves are working. This procedure takes approximately one hour. There are no restrictions for this procedure. [address]   Follow-Up: At [facility], you and your health needs are our priority.  As part of our continuing mission to provide you with exceptional heart care, we have created designated Provider Care Teams.  These Care Teams include your primary Cardiologist (physician) and Advanced Practice Providers (APPs -  Physician Assistants and Nurse Practitioners) who all work together to provide you with the care you need, when you need it.  We recommend signing up for the patient portal called "MyChart".  Sign up information is provided on this After Visit Summary.  MyChart is used to connect with patients for Virtual Visits (Telemedicine).  Patients are able to view lab/test results, encounter notes, upcoming appointments, etc.  Non-urgent messages can be sent to your provider as well.   To learn more about what you can do with MyChart, go to [email or web address].    Your next appointment:   6 month(s)  The format for your next appointment:   In Person  Provider:   K. Mali Hilty, MD

## 2020-11-18 ENCOUNTER — Encounter: Payer: Self-pay | Admitting: Internal Medicine

## 2020-11-29 ENCOUNTER — Telehealth: Payer: Self-pay | Admitting: Internal Medicine

## 2020-11-29 NOTE — Telephone Encounter (Signed)
Yes, if possible  Dr Lemmie Evens

## 2020-11-29 NOTE — Telephone Encounter (Signed)
[name] [name] is calling wanting to know if she can be given oxygen until her current issues are resolved. She states her oxygen level today is 77. It normally ranges between 82-87 and gets as high as 92-94. Please advise.

## 2020-11-29 NOTE — Telephone Encounter (Signed)
Returned the call to the patient. She stated that her oxygen level has been low for a while. She stated that is was 14 today and has been in the 80's normally. She denies shortness of breath and discoloration on her lips and fingertips.  She was asked to check it while on the phone and she said it was 84. She was then asked to give the other number on the pulse ox and stated that it was 94. She was educated on the difference between the oxygen level and heart rate number. She stated she did see the the percentage sign next to the 94 now.  She was also calling to see if she should have her echo done before August 1

## 2020-11-30 NOTE — Telephone Encounter (Signed)
Message routed to [facility] pool to arrange sooner echo appointment, per MD

## 2020-12-02 ENCOUNTER — Other Ambulatory Visit: Payer: Self-pay | Admitting: Family Medicine

## 2020-12-06 NOTE — Telephone Encounter (Signed)
Staff message sent to [facility] pool

## 2020-12-14 NOTE — Telephone Encounter (Signed)
Contacted pt to schedule for sooner per message. Echo has been rescheduled for 01/10/21.

## 2020-12-17 ENCOUNTER — Other Ambulatory Visit: Payer: Self-pay

## 2020-12-17 ENCOUNTER — Encounter: Payer: Self-pay | Admitting: Family Medicine

## 2020-12-17 ENCOUNTER — Ambulatory Visit: Payer: Medicaid Other | Admitting: Family Medicine

## 2020-12-17 VITALS — BP 124/70 | HR 70 | Temp 97.9°F | Ht 64.0 in | Wt 148.8 lb

## 2020-12-17 DIAGNOSIS — F988 Other specified behavioral and emotional disorders with onset usually occurring in childhood and adolescence: Secondary | ICD-10-CM

## 2020-12-17 DIAGNOSIS — F341 Dysthymic disorder: Secondary | ICD-10-CM

## 2020-12-17 DIAGNOSIS — E785 Hyperlipidemia, unspecified: Secondary | ICD-10-CM | POA: Diagnosis not present

## 2020-12-17 MED ORDER — QUETIAPINE FUMARATE 25 MG PO TABS: 25.0000 mg | ORAL_TABLET | Freq: Every day | ORAL | 1 refills | Status: DC

## 2020-12-17 NOTE — Progress Notes (Signed)
[name] [name] DOB: Jun 25, 1956 Encounter date: 12/17/2020  This is a 65 y.o. female who presents with Chief Complaint  Patient presents with  . Depression    History of present illness: Last visit with me was 06/2020.  At that time she had urinary tract infection, was not sleeping well, and had concerns about memory.  Vibryd was miracle for her when she was on this. Just felt like whole world improved. Then about a year ago felt like things were getting worse. Had to move in with son around [place] and feels like all news has been bad recently. She is a chronic Research officer, trade union. Not sleeping at night. Can stay awake for a couple of days and then will catch up during day time after those spells.   Has got to point now where joints, arms, hands, foot throbs and hurts badly. Hates even getting up out of bed because of pain.   Was put on abx from urology to see if that would calm down bladder. Noticed improvement in abd discomfort. Tried without the abx for a week. Not sure what antibiotic is.   Neighbor has noticed something different. She used to go places with neighbor and now hasn't gone anywhere; she just feels worse when she hears this because she isn't doing anything. Just doing what she has to in order to get by.  Hasn't seen grandaughter in 3+ months due to not feeling like driving there; 1.5 hours away.   Does have xanax but if she takes this she will space out from taking pain medications.   She does seen pain mgmt. Taking oxycodone 10mg  but breaks in half and takes this 4 times/day. Takes half whenever she can.   Had been seeing Dr. Toy Care - but she no longer takes her insurance. She only has about 10 of her xanax left. Called nurse line other day for chest pain - thinks anxiety attack. Thinks worrying about everything. Sometimes feels like she can't get whole breath.   Doesn't remember what happened with cymbalta, wellbutrin that she took in the past. Did well with effexor in the past,  but not as well as viibryd.    Allergies  Allergen Reactions  . Ciprofloxacin Itching and Palpitations    migraine  . Macrobid [Nitrofurantoin Macrocrystal] Other (See Comments)    Body aches and Migraine headache  . Ampicillin Other (See Comments)    Told by md not to take ampilciian due to penicillin allergy  . Atorvastatin Other (See Comments)    Felt like had flu  . Buspirone Hcl Other (See Comments)    REACTION: intolerance  . Ezetimibe-Simvastatin Other (See Comments)    Unknown  . Latex Other (See Comments)    Redness and burning  . Lyrica [Pregabalin] Other (See Comments)    Caused swelling in legs and hands  . Penicillins Other (See Comments)    Fever and itching  . Sulfa Antibiotics Nausea Only   Current Meds  Medication Sig  . ALPRAZolam (XANAX PO) Take by mouth as needed.  Marland Kitchen amphetamine-dextroamphetamine (ADDERALL) 30 MG tablet Take 15 mg by mouth daily as needed (for attention).   . cephALEXin (KEFLEX) 250 MG capsule Take 250 mg by mouth at bedtime.  . cyclobenzaprine (FLEXERIL) 10 MG tablet 4 (four) times daily as needed.  . diclofenac sodium (VOLTAREN) 1 % GEL Apply 4 g topically 4 (four) times daily.   Marland Kitchen Heating Pad PADS Use 3 times weekly  . hydrOXYzine (ATARAX/VISTARIL) 25 MG tablet TAKE 1 TABLET  BY MOUTH TWICE DAILY AS NEEDED FOR ITCHING  . ondansetron (ZOFRAN-ODT) 4 MG disintegrating tablet DISSOLVE 1 TABLET BY MOUTH EVERY 8 HOURS AS NEEDED FOR NAUSEA  . oxyCODONE (OXY IR/ROXICODONE) 5 MG immediate release tablet Take 5 mg by mouth every 4 (four) hours as needed.  . promethazine (PHENERGAN) 25 MG suppository UNWRAP AND INSERT ONE SUPPOSITORY RECTALLY EVERY 6 HOURS AS NEEDED FOR NAUSEA.  . QUEtiapine (SEROQUEL) 25 MG tablet Take 1 tablet (25 mg total) by mouth at bedtime.  Marland Kitchen VIIBRYD 40 MG TABS TAKE 1 TABLET BY MOUTH DAILY WITH FULL MEAL.  Marland Kitchen Vitamin D, Ergocalciferol, (DRISDOL) 1.25 MG (50000 UNIT) CAPS capsule TAKE 1 CAPSULE BY MOUTH ONCE WEEKLY.    Review  of Systems  Constitutional: Negative for chills, fatigue and fever.  Respiratory: Negative for cough, chest tightness, shortness of breath and wheezing.   Cardiovascular: Negative for chest pain, palpitations and leg swelling.  Psychiatric/Behavioral: Positive for decreased concentration and sleep disturbance. Negative for suicidal ideas. The patient is nervous/anxious.     Objective:  BP 124/70 (BP Location: Left Arm, Patient Position: Sitting, Cuff Size: Normal)   Pulse 70   Temp 97.9 F (36.6 C) (Oral)   Ht 5\' 4"  (1.626 m)   Wt 148 lb 12.8 oz (67.5 kg)   BMI 25.54 kg/m   Weight: 148 lb 12.8 oz (67.5 kg)   BP Readings from Last 3 Encounters:  12/17/20 124/70  07/14/20 136/80  07/07/20 138/80   Wt Readings from Last 3 Encounters:  12/17/20 148 lb 12.8 oz (67.5 kg)  07/14/20 159 lb 6.4 oz (72.3 kg)  07/07/20 158 lb 12.8 oz (72 kg)    Physical Exam Constitutional:      General: She is not in acute distress.    Appearance: She is well-developed.  Cardiovascular:     Rate and Rhythm: Normal rate and regular rhythm.     Heart sounds: Normal heart sounds. No murmur heard. No friction rub.  Pulmonary:     Effort: Pulmonary effort is normal. No respiratory distress.     Breath sounds: Normal breath sounds. No wheezing or rales.  Musculoskeletal:     Right lower leg: No edema.     Left lower leg: No edema.  Neurological:     Mental Status: She is alert and oriented to person, place, and time.  Psychiatric:        Attention and Perception: Attention normal.        Mood and Affect: Mood is depressed. Mood is not anxious.        Speech: Speech normal.        Behavior: Behavior normal.        Cognition and Memory: Cognition normal.    PHQ9 SCORE ONLY 12/17/2020 03/19/2020 06/12/2018  PHQ-9 Total Score 20 0 18   GAD 7 : Generalized Anxiety Score 12/17/2020  Nervous, Anxious, on Edge 2  Control/stop worrying 3  Worry too much - different things 3  Trouble relaxing 3   Restless 0  Easily annoyed or irritable 2  Afraid - awful might happen 2  Total GAD 7 Score 15     Assessment/Plan  1. Hyperlipidemia, unspecified hyperlipidemia type Continue with Crestor 20 mg daily.  2. Attention deficit disorder, unspecified hyperactivity presence Continue with Adderall 15 mg daily  3. DEPRESSION/ANXIETY Currently taking Viibryd.  I am going to add Seroquel at bedtime to help with sleep as well as better anxiety control and mood control overall. Discussed new medication(s) today  with patient. Discussed potential side effects and patient verbalized understanding.  - QUEtiapine (SEROQUEL) 25 MG tablet; Take 1 tablet (25 mg total) by mouth at bedtime.  Dispense: 90 tablet; Refill: 1   Return in about 3 weeks (around 01/07/2021) for Chronic condition visit.  45 minutes spent in time with patient, discussion of medication treatment options, discussion of current symptoms, chart review, and charting.   Micheline Rough, MD

## 2020-12-20 ENCOUNTER — Telehealth: Payer: Self-pay | Admitting: Family Medicine

## 2020-12-20 NOTE — Telephone Encounter (Signed)
cyclobenzaprine (FLEXERIL) 10 MG tablet  [place], [place] Phone:  [phone]  Fax:  [phone]

## 2020-12-20 NOTE — Telephone Encounter (Signed)
Left a message for the pt to return my call.  

## 2020-12-20 NOTE — Telephone Encounter (Signed)
I don't usually fill this for her; does she typically get through pain mgmt? As she turns 65, we also need to consider dose of medication. If she needs me to write this I would be ok with that, but would suggest we try 5mg  and limit to TID prn.

## 2020-12-21 ENCOUNTER — Telehealth: Payer: Self-pay | Admitting: *Deleted

## 2020-12-21 NOTE — Telephone Encounter (Signed)
[facility] faxed a request for a prior authorization on Quetiapine 25mg .  Due to insurance, request must be completed via [employer] form and this was forwarded to PCP for completion.

## 2020-12-22 NOTE — Telephone Encounter (Signed)
Form faxed to [place] at [phone].

## 2020-12-22 NOTE — Telephone Encounter (Signed)
Form completed.

## 2020-12-23 ENCOUNTER — Telehealth: Payer: Self-pay | Admitting: Family Medicine

## 2020-12-23 NOTE — Telephone Encounter (Signed)
Left a message for the pt to return my call.  

## 2020-12-23 NOTE — Telephone Encounter (Signed)
See prior note

## 2020-12-23 NOTE — Telephone Encounter (Signed)
Pt call and stated she need a refill on  cyclobenzaprine (FLEXERIL) 10 MG tablet sent to  [place], [place] Phone:  [phone]  Fax:  [phone]

## 2020-12-28 DIAGNOSIS — R350 Frequency of micturition: Secondary | ICD-10-CM | POA: Diagnosis not present

## 2020-12-31 DIAGNOSIS — M5416 Radiculopathy, lumbar region: Secondary | ICD-10-CM | POA: Diagnosis not present

## 2020-12-31 DIAGNOSIS — G894 Chronic pain syndrome: Secondary | ICD-10-CM | POA: Diagnosis not present

## 2021-01-10 ENCOUNTER — Other Ambulatory Visit: Payer: Self-pay

## 2021-01-10 ENCOUNTER — Ambulatory Visit (HOSPITAL_COMMUNITY): Payer: Medicare Other | Attending: Cardiology

## 2021-01-10 DIAGNOSIS — I350 Nonrheumatic aortic (valve) stenosis: Secondary | ICD-10-CM

## 2021-01-10 LAB — ECHOCARDIOGRAM COMPLETE
AR max vel: 0.7 cm2
AV Area VTI: 0.94 cm2
AV Area mean vel: 0.74 cm2
AV Mean grad: 34 mmHg
AV Peak grad: 52.4 mmHg
Ao pk vel: 3.62 m/s
Area-P 1/2: 4.68 cm2
P 1/2 time: 356 msec
S' Lateral: 2.4 cm

## 2021-01-17 ENCOUNTER — Other Ambulatory Visit: Payer: Self-pay | Admitting: *Deleted

## 2021-01-17 DIAGNOSIS — I350 Nonrheumatic aortic (valve) stenosis: Secondary | ICD-10-CM

## 2021-01-25 ENCOUNTER — Other Ambulatory Visit: Payer: Self-pay

## 2021-01-25 ENCOUNTER — Ambulatory Visit (INDEPENDENT_AMBULATORY_CARE_PROVIDER_SITE_OTHER): Payer: Medicare Other | Admitting: Cardiovascular Disease

## 2021-01-25 ENCOUNTER — Encounter: Payer: Self-pay | Admitting: Cardiovascular Disease

## 2021-01-25 VITALS — BP 124/76 | HR 93 | Ht 64.0 in | Wt 157.0 lb

## 2021-01-25 DIAGNOSIS — Z01812 Encounter for preprocedural laboratory examination: Secondary | ICD-10-CM | POA: Diagnosis not present

## 2021-01-25 DIAGNOSIS — I350 Nonrheumatic aortic (valve) stenosis: Secondary | ICD-10-CM | POA: Diagnosis not present

## 2021-01-25 LAB — BASIC METABOLIC PANEL
BUN/Creatinine Ratio: 9 — ABNORMAL LOW (ref 12–28)
BUN: 9 mg/dL (ref 8–27)
CO2: 19 mmol/L — ABNORMAL LOW (ref 20–29)
Calcium: 9.1 mg/dL (ref 8.7–10.3)
Chloride: 102 mmol/L (ref 96–106)
Creatinine, Ser: 1.01 mg/dL — ABNORMAL HIGH (ref 0.57–1.00)
Glucose: 104 mg/dL — ABNORMAL HIGH (ref 65–99)
Potassium: 4.3 mmol/L (ref 3.5–5.2)
Sodium: 139 mmol/L (ref 134–144)
eGFR: 62 mL/min/{1.73_m2} (ref >59)

## 2021-01-25 LAB — CBC
Hematocrit: 45.9 % (ref 34.0–46.6)
Hemoglobin: 15.8 g/dL (ref 11.1–15.9)
MCH: 31.9 pg (ref 26.6–33.0)
MCHC: 34.4 g/dL (ref 31.5–35.7)
MCV: 93 fL (ref 79–97)
Platelets: 310 10*3/uL (ref 150–450)
RBC: 4.96 x10E6/uL (ref 3.77–5.28)
RDW: 12.7 % (ref 11.7–15.4)
WBC: 7.5 10*3/uL (ref 3.4–10.8)
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
## 2021-01-25 NOTE — Patient Instructions (Addendum)
Medication Instructions:  No changes *If you need a refill on your cardiac medications before your next appointment, please call your pharmacy*   Lab Work: Today: BMET/CBC  Testing/Procedures: Cath - see instructions below  Other [place] OFFICE [place], [place] [address] Dept: [place]: [place]  01/25/2021  You are scheduled for a Cardiac Catheterization on Thursday, May 12 with Dr. Lauree Chandler.  1. Please arrive at the [facility] (Main Entrance A) at [facility]: [address] [place], [place] [address] at 5:30 AM (This time is two hours before your procedure to ensure your preparation). Free valet parking service is available.   Special note: Every effort is made to have your procedure done on time. Please understand that emergencies sometimes delay scheduled procedures.  2. Diet: Do not eat solid foods after midnight.  The patient may have clear liquids until 5am upon the day of the procedure.  3. Labs: You will need to have blood drawn on Tuesday, May 3 at [facility]. [address]  Due to recent COVID-19 restrictions implemented by our local and state authorities and in an effort to keep both patients and staff as safe as possible, our hospital system requires COVID-19 testing prior to certain scheduled hospital procedures.  Please go to [facility]. [place],  [id number] on 02/01/21 at 10:35 am.  This is a drive up testing site.  You will not need to exit your vehicle.  You will not be billed at the time of testing but may receive a bill later depending on your insurance. You must agree to self-quarantine from the time of your testing until the procedure date on 02/03/21.  This should included staying home with ONLY the people you live with.  Avoid take-out, grocery  store shopping or leaving the house for any non-emergent reason.  Failure to have your COVID-19 test done on the date and time you have been scheduled will result in cancellation of your procedure.  Please call our office at [phone] if you have any questions.   4. Medication instructions in preparation for your procedure:   Contrast Allergy: No  On the morning of your procedure, take your Aspirin 81 mg and any morning medicines NOT listed above.  You may use sips of water.  5. Plan for one night stay--bring personal belongings. 6. Bring a current list of your medications and current insurance cards. 7. You MUST have a responsible person to drive you home. 8. Someone MUST be with you the first 24 hours after you arrive home or your discharge will be delayed. 9. Please wear clothes that are easy to get on and off and wear slip-on shoes.  Thank you for allowing [place] to care for you!   -- [facility] [place] [facility]\

## 2021-01-27 DIAGNOSIS — M5416 Radiculopathy, lumbar region: Secondary | ICD-10-CM | POA: Diagnosis not present

## 2021-01-27 DIAGNOSIS — G894 Chronic pain syndrome: Secondary | ICD-10-CM | POA: Diagnosis not present

## 2021-02-01 ENCOUNTER — Other Ambulatory Visit (HOSPITAL_COMMUNITY)
Admission: RE | Admit: 2021-02-01 | Discharge: 2021-02-01 | Disposition: A | Discharge status: Home / Self Care | Payer: Medicare Other | Source: Ambulatory Visit | Attending: Cardiovascular Disease | Admitting: Cardiovascular Disease

## 2021-02-01 ENCOUNTER — Telehealth: Payer: Self-pay | Admitting: *Deleted

## 2021-02-01 DIAGNOSIS — Z01812 Encounter for preprocedural laboratory examination: Secondary | ICD-10-CM | POA: Diagnosis not present

## 2021-02-01 DIAGNOSIS — Z20822 Contact with and (suspected) exposure to covid-19: Secondary | ICD-10-CM | POA: Diagnosis not present

## 2021-02-01 LAB — SARS CORONAVIRUS 2 (TAT 6-24 HRS): SARS Coronavirus 2: NEGATIVE

## 2021-02-01 NOTE — Telephone Encounter (Addendum)
Pt contacted pre-catheterization scheduled at [facility] for: Thursday Feb 03, 2021 7:30 AM Verified arrival time and place: [facility]) at: 5:30 AM   No solid food after midnight prior to cath, clear liquids until 5 AM day of procedure.   AM meds can be  taken pre-cath with sips of water including: ASA 81 mg   Confirmed patient has responsible adult to drive home post procedure and be with patient first 24 hours after arriving home: yes  You are allowed ONE visitor in the waiting room during the time you are at the hospital for your procedure. Both you and your visitor must wear a mask once you enter the hospital.   Reviewed procedure/mask/visitor instructions with patient.

## 2021-02-03 ENCOUNTER — Other Ambulatory Visit: Payer: Self-pay

## 2021-02-03 ENCOUNTER — Encounter (HOSPITAL_COMMUNITY)
Admission: RE | Disposition: A | Discharge status: Home / Self Care | Payer: Self-pay | Source: Ambulatory Visit | Attending: Cardiovascular Disease

## 2021-02-03 ENCOUNTER — Ambulatory Visit (HOSPITAL_COMMUNITY)
Admission: RE | Admit: 2021-02-03 | Discharge: 2021-02-03 | Disposition: A | Discharge status: Home / Self Care | Payer: Medicare Other | Source: Ambulatory Visit | Attending: Cardiovascular Disease | Admitting: Cardiovascular Disease

## 2021-02-03 DIAGNOSIS — E785 Hyperlipidemia, unspecified: Secondary | ICD-10-CM | POA: Insufficient documentation

## 2021-02-03 DIAGNOSIS — Z880 Allergy status to penicillin: Secondary | ICD-10-CM | POA: Insufficient documentation

## 2021-02-03 DIAGNOSIS — M797 Fibromyalgia: Secondary | ICD-10-CM | POA: Insufficient documentation

## 2021-02-03 DIAGNOSIS — F32A Depression, unspecified: Secondary | ICD-10-CM | POA: Diagnosis not present

## 2021-02-03 DIAGNOSIS — Z888 Allergy status to other drugs, medicaments and biological substances status: Secondary | ICD-10-CM | POA: Diagnosis not present

## 2021-02-03 DIAGNOSIS — Z882 Allergy status to sulfonamides status: Secondary | ICD-10-CM | POA: Diagnosis not present

## 2021-02-03 DIAGNOSIS — Z79899 Other long term (current) drug therapy: Secondary | ICD-10-CM | POA: Diagnosis not present

## 2021-02-03 DIAGNOSIS — Z91040 Latex allergy status: Secondary | ICD-10-CM | POA: Insufficient documentation

## 2021-02-03 DIAGNOSIS — I352 Nonrheumatic aortic (valve) stenosis with insufficiency: Secondary | ICD-10-CM | POA: Diagnosis not present

## 2021-02-03 DIAGNOSIS — K219 Gastro-esophageal reflux disease without esophagitis: Secondary | ICD-10-CM | POA: Insufficient documentation

## 2021-02-03 DIAGNOSIS — I350 Nonrheumatic aortic (valve) stenosis: Secondary | ICD-10-CM

## 2021-02-03 HISTORY — PX: RIGHT/LEFT HEART CATH AND CORONARY ANGIOGRAPHY: CATH118266

## 2021-02-03 LAB — POCT I-STAT 7, (LYTES, BLD GAS, ICA,H+H)
Acid-Base Excess: 1 mmol/L (ref 0.0–2.0)
Bicarbonate: 27.5 mmol/L (ref 20.0–28.0)
Calcium, Ion: 1.16 mmol/L (ref 1.15–1.40)
HCT: 39 % (ref 36.0–46.0)
Hemoglobin: 13.3 g/dL (ref 12.0–15.0)
O2 Saturation: 99 %
Potassium: 4 mmol/L (ref 3.5–5.1)
Sodium: 143 mmol/L (ref 135–145)
TCO2: 29 mmol/L (ref 22–32)
pCO2 arterial: 51.5 mmHg — ABNORMAL HIGH (ref 32.0–48.0)
pH, Arterial: 7.335 — ABNORMAL LOW (ref 7.350–7.450)
pO2, Arterial: 138 mmHg — ABNORMAL HIGH (ref 83.0–108.0)

## 2021-02-03 LAB — POCT I-STAT EG7
Acid-Base Excess: 1 mmol/L (ref 0.0–2.0)
Bicarbonate: 29 mmol/L — ABNORMAL HIGH (ref 20.0–28.0)
Calcium, Ion: 1.19 mmol/L (ref 1.15–1.40)
HCT: 40 % (ref 36.0–46.0)
Hemoglobin: 13.6 g/dL (ref 12.0–15.0)
O2 Saturation: 73 %
Potassium: 3.9 mmol/L (ref 3.5–5.1)
Sodium: 143 mmol/L (ref 135–145)
TCO2: 31 mmol/L (ref 22–32)
pCO2, Ven: 57.6 mmHg (ref 44.0–60.0)
pH, Ven: 7.31 (ref 7.250–7.430)
pO2, Ven: 43 mmHg (ref 32.0–45.0)

## 2021-02-03 SURGERY — RIGHT/LEFT HEART CATH AND CORONARY ANGIOGRAPHY
Anesthesia: LOCAL

## 2021-02-03 MED ORDER — VERAPAMIL HCL 2.5 MG/ML IV SOLN
INTRAVENOUS | Status: AC
  Filled 2021-02-03: qty 2

## 2021-02-03 MED ORDER — LIDOCAINE HCL (PF) 1 % IJ SOLN
INTRAMUSCULAR | Status: DC | PRN
  Administered 2021-02-03 (×2): 2 mL

## 2021-02-03 MED ORDER — HYDRALAZINE HCL 20 MG/ML IJ SOLN: 10.0000 mg | INTRAMUSCULAR | Status: DC | PRN

## 2021-02-03 MED ORDER — ASPIRIN 81 MG PO CHEW: 81.0000 mg | CHEWABLE_TABLET | ORAL | Status: DC

## 2021-02-03 MED ORDER — SODIUM CHLORIDE 0.9% FLUSH: 3.0000 mL | INTRAVENOUS | Status: DC | PRN

## 2021-02-03 MED ORDER — HEPARIN SODIUM (PORCINE) 1000 UNIT/ML IJ SOLN
INTRAMUSCULAR | Status: AC
  Filled 2021-02-03: qty 1

## 2021-02-03 MED ORDER — SODIUM CHLORIDE 0.9 % IV SOLN: 250.0000 mL | INTRAVENOUS | Status: DC | PRN

## 2021-02-03 MED ORDER — SODIUM CHLORIDE 0.9% FLUSH: 3.0000 mL | Freq: Two times a day (BID) | INTRAVENOUS | Status: DC

## 2021-02-03 MED ORDER — LIDOCAINE HCL (PF) 1 % IJ SOLN
INTRAMUSCULAR | Status: AC
  Filled 2021-02-03: qty 30

## 2021-02-03 MED ORDER — ACETAMINOPHEN 325 MG PO TABS: 650.0000 mg | ORAL_TABLET | ORAL | Status: DC | PRN

## 2021-02-03 MED ORDER — FENTANYL CITRATE (PF) 100 MCG/2ML IJ SOLN
INTRAMUSCULAR | Status: AC
  Filled 2021-02-03: qty 2

## 2021-02-03 MED ORDER — LABETALOL HCL 5 MG/ML IV SOLN: 10.0000 mg | INTRAVENOUS | Status: DC | PRN

## 2021-02-03 MED ORDER — MIDAZOLAM HCL 2 MG/2ML IJ SOLN
INTRAMUSCULAR | Status: AC
  Filled 2021-02-03: qty 2

## 2021-02-03 MED ORDER — VERAPAMIL HCL 2.5 MG/ML IV SOLN
INTRAVENOUS | Status: DC | PRN
  Administered 2021-02-03: 10 mL via INTRA_ARTERIAL

## 2021-02-03 MED ORDER — MIDAZOLAM HCL 2 MG/2ML IJ SOLN
INTRAMUSCULAR | Status: DC | PRN
  Administered 2021-02-03 (×3): 1 mg via INTRAVENOUS

## 2021-02-03 MED ORDER — ONDANSETRON HCL 4 MG/2ML IJ SOLN: 4.0000 mg | Freq: Four times a day (QID) | INTRAMUSCULAR | Status: DC | PRN

## 2021-02-03 MED ORDER — HEPARIN (PORCINE) IN NACL 1000-0.9 UT/500ML-% IV SOLN
INTRAVENOUS | Status: DC | PRN
  Administered 2021-02-03 (×2): 500 mL

## 2021-02-03 MED ORDER — HEPARIN (PORCINE) IN NACL 1000-0.9 UT/500ML-% IV SOLN
INTRAVENOUS | Status: AC
  Filled 2021-02-03: qty 1000

## 2021-02-03 MED ORDER — SODIUM CHLORIDE 0.9 % IV SOLN: INTRAVENOUS | Status: AC

## 2021-02-03 MED ORDER — FENTANYL CITRATE (PF) 100 MCG/2ML IJ SOLN
INTRAMUSCULAR | Status: DC | PRN
  Administered 2021-02-03 (×3): 25 ug via INTRAVENOUS

## 2021-02-03 MED ORDER — SODIUM CHLORIDE 0.9 % WEIGHT BASED INFUSION: 1.0000 mL/kg/h | INTRAVENOUS | Status: DC

## 2021-02-03 MED ORDER — IOHEXOL 350 MG/ML SOLN
INTRAVENOUS | Status: DC | PRN
  Administered 2021-02-03: 35 mL via INTRA_ARTERIAL

## 2021-02-03 MED ORDER — SODIUM CHLORIDE 0.9 % WEIGHT BASED INFUSION
3.0000 mL/kg/h | INTRAVENOUS | Status: AC
  Administered 2021-02-03: 3 mL/kg/h via INTRAVENOUS

## 2021-02-03 MED ORDER — HEPARIN SODIUM (PORCINE) 1000 UNIT/ML IJ SOLN
INTRAMUSCULAR | Status: DC | PRN
  Administered 2021-02-03: 4000 [IU] via INTRAVENOUS

## 2021-02-03 SURGICAL SUPPLY — 12 items
CATH 5FR JL3.5 JR4 ANG PIG MP (CATHETERS) ×2 IMPLANT
CATH BALLN WEDGE 5F 110CM (CATHETERS) ×2 IMPLANT
DEVICE RAD COMP TR BAND LRG (VASCULAR PRODUCTS) ×2 IMPLANT
GLIDESHEATH SLEND SS 6F .021 (SHEATH) ×2 IMPLANT
GUIDEWIRE INQWIRE 1.5J.035X260 (WIRE) ×1 IMPLANT
INQWIRE 1.5J .035X260CM (WIRE) ×2
KIT HEART LEFT (KITS) ×2 IMPLANT
PACK CARDIAC CATHETERIZATION (CUSTOM PROCEDURE TRAY) ×2 IMPLANT
SHEATH GLIDE SLENDER 4/5FR (SHEATH) ×2 IMPLANT
SHEATH PROBE COVER 6X72 (BAG) ×2 IMPLANT
TRANSDUCER W/STOPCOCK (MISCELLANEOUS) ×2 IMPLANT
TUBING CIL FLEX 10 FLL-RA (TUBING) ×2 IMPLANT

## 2021-02-03 NOTE — Discharge Instructions (Signed)
Radial Site Care  This sheet gives you information about how to care for yourself after your procedure. Your health care provider may also give you more specific instructions. If you have problems or questions, contact your health care provider. What can I expect after the procedure? After the procedure, it is common to have:  Bruising and tenderness at the catheter insertion area. Follow these instructions at home: Medicines  Take over-the-counter and prescription medicines only as told by your health care provider. Insertion site care 1. Follow instructions from your health care provider about how to take care of your insertion site. Make sure you: ? Wash your hands with soap and water before you remove your bandage (dressing). If soap and water are not available, use hand sanitizer. ? May remove dressing in 24 hours. 2. Check your insertion site every day for signs of infection. Check for: ? Redness, swelling, or pain. ? Fluid or blood. ? Pus or a bad smell. ? Warmth. 3. Do no take baths, swim, or use a hot tub for 5 days. 4. You may shower 24-48 hours after the procedure. ? Remove the dressing and gently wash the site with plain soap and water. ? Pat the area dry with a clean towel. ? Do not rub the site. That could cause bleeding. 5. Do not apply powder or lotion to the site. Activity  1. For 24 hours after the procedure, or as directed by your health care provider: ? Do not flex or bend the affected arm. ? Do not push or pull heavy objects with the affected arm. ? Do not drive yourself home from the hospital or clinic. You may drive 24 hours after the procedure. ? Do not operate machinery or power tools. ? KEEP ARM ELEVATED THE REMAINDER OF THE DAY. 2. Do not push, pull or lift anything that is heavier than 10 lb for 5 days. 3. Ask your health care provider when it is okay to: ? Return to work or school. ? Resume usual physical activities or sports. ? Resume sexual  activity. General instructions  If the catheter site starts to bleed, raise your arm and put firm pressure on the site. If the bleeding does not stop, get help right away. This is a medical emergency.  DRINK PLENTY OF FLUIDS FOR THE NEXT 2-3 DAYS.  No alcohol consumption for 24 hours after receiving sedation.  If you went home on the same day as your procedure, a responsible adult should be with you for the first 24 hours after you arrive home.  Keep all follow-up visits as told by your health care provider. This is important. Contact a health care provider if:  You have a fever.  You have redness, swelling, or yellow drainage around your insertion site. Get help right away if:  You have unusual pain at the radial site.  The catheter insertion area swells very fast.  The insertion area is bleeding, and the bleeding does not stop when you hold steady pressure on the area.  Your arm or hand becomes pale, cool, tingly, or numb. These symptoms may represent a serious problem that is an emergency. Do not wait to see if the symptoms will go away. Get medical help right away. Call your local emergency services (911 in the [place] Do not drive yourself to the hospital. Summary  After the procedure, it is common to have bruising and tenderness at the site.  Follow instructions from your health care provider about how to take care   of your radial site wound. Check the wound every day for signs of infection.  This information is not intended to replace advice given to you by your health care provider. Make sure you discuss any questions you have with your health care provider. Document Revised: 10/17/2017 Document Reviewed: 10/17/2017 Elsevier Patient Education  2020 [employer] 

## 2021-02-03 NOTE — Interval H&P Note (Signed)
History and Physical Interval Note:  02/03/2021 8:34 AM  [name] [name]  has presented today for surgery, with the diagnosis of aortic stenosis.  The various methods of treatment have been discussed with the patient and family. After consideration of risks, benefits and other options for treatment, the patient has consented to  Procedure(s): RIGHT/LEFT HEART CATH AND CORONARY ANGIOGRAPHY (N/A) as a surgical intervention.  The patient's history has been reviewed, patient examined, no change in status, stable for surgery.  I have reviewed the patient's chart and labs.  Questions were answered to the patient's satisfaction.    Cath Lab Visit (complete for each Cath Lab visit)  Clinical Evaluation Leading to the Procedure:   ACS: No.  Non-ACS:    Anginal Classification: CCS II  Anti-ischemic medical therapy: No Therapy  Non-Invasive Test Results: No non-invasive testing performed  Prior CABG: No previous CABG        Lauree Chandler

## 2021-02-03 NOTE — Progress Notes (Signed)
Discharge instructions reviewed with pt and her son (via telephone) both voice understanding.  

## 2021-02-04 ENCOUNTER — Encounter (HOSPITAL_COMMUNITY): Payer: Self-pay | Admitting: Cardiovascular Disease

## 2021-02-04 ENCOUNTER — Other Ambulatory Visit: Payer: Self-pay

## 2021-02-04 MED ORDER — METOPROLOL TARTRATE 100 MG PO TABS: ORAL_TABLET | ORAL | 0 refills | Status: DC

## 2021-02-10 ENCOUNTER — Ambulatory Visit (HOSPITAL_COMMUNITY)
Admission: RE | Admit: 2021-02-10 | Discharge: 2021-02-10 | Disposition: A | Discharge status: Home / Self Care | Payer: Medicare Other | Source: Ambulatory Visit | Attending: Cardiovascular Disease | Admitting: Cardiovascular Disease

## 2021-02-10 ENCOUNTER — Ambulatory Visit (HOSPITAL_BASED_OUTPATIENT_CLINIC_OR_DEPARTMENT_OTHER)
Admission: RE | Admit: 2021-02-10 | Discharge: 2021-02-10 | Disposition: A | Discharge status: Home / Self Care | Payer: Medicare Other | Source: Ambulatory Visit | Attending: Cardiovascular Disease | Admitting: Cardiovascular Disease

## 2021-02-10 ENCOUNTER — Other Ambulatory Visit: Payer: Self-pay

## 2021-02-10 DIAGNOSIS — I350 Nonrheumatic aortic (valve) stenosis: Secondary | ICD-10-CM

## 2021-02-10 DIAGNOSIS — I700 Atherosclerosis of aorta: Secondary | ICD-10-CM | POA: Diagnosis not present

## 2021-02-10 MED ORDER — IOHEXOL 350 MG/ML SOLN
100.0000 mL | Freq: Once | INTRAVENOUS | Status: AC | PRN
  Administered 2021-02-10: 100 mL via INTRAVENOUS

## 2021-02-10 NOTE — Progress Notes (Signed)
VASCULAR LAB    Carotid duplex has been performed.  See CV proc for preliminary results.   Sabir Charters, RVT 02/10/2021, 9:54 AM

## 2021-02-11 ENCOUNTER — Ambulatory Visit: Payer: Medicaid Other | Admitting: Family Medicine

## 2021-02-14 ENCOUNTER — Telehealth: Payer: Self-pay | Admitting: Cardiovascular Disease

## 2021-02-14 ENCOUNTER — Other Ambulatory Visit: Payer: Self-pay | Admitting: Physician Assistant

## 2021-02-14 NOTE — Telephone Encounter (Signed)
    Pt is calling to follow up carotid result, she ask when it will be available

## 2021-02-16 NOTE — Telephone Encounter (Signed)
I spoke with the pt and made her aware of carotid results.

## 2021-02-18 DIAGNOSIS — M5416 Radiculopathy, lumbar region: Secondary | ICD-10-CM | POA: Diagnosis not present

## 2021-02-18 DIAGNOSIS — G894 Chronic pain syndrome: Secondary | ICD-10-CM | POA: Diagnosis not present

## 2021-02-23 ENCOUNTER — Encounter: Payer: Medicare Other | Admitting: Thoracic Surgery (Cardiothoracic Vascular Surgery)

## 2021-02-24 ENCOUNTER — Other Ambulatory Visit: Payer: Self-pay

## 2021-02-24 ENCOUNTER — Institutional Professional Consult (permissible substitution) (INDEPENDENT_AMBULATORY_CARE_PROVIDER_SITE_OTHER): Payer: Medicare Other | Admitting: Thoracic Surgery (Cardiothoracic Vascular Surgery)

## 2021-02-24 VITALS — BP 150/78 | HR 88 | Resp 20 | Ht 64.0 in | Wt 155.0 lb

## 2021-02-24 DIAGNOSIS — I350 Nonrheumatic aortic (valve) stenosis: Secondary | ICD-10-CM

## 2021-02-24 NOTE — Patient Instructions (Signed)

## 2021-02-24 NOTE — Progress Notes (Signed)
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 was placed in the right radial artery. 3 mg Verapamil was given through the sheath. 4000 units IV heparin was given. Standard  diagnostic catheters were used to perform selective coronary angiography. The aortic valve was crossed  with the JR4 catheter and a J wire. LV pressures measured. No LV gram. The sheath was removed from the right radial artery and a Terumo hemostasis band was applied at the arteriotomy site on the right wrist.    Estimated blood loss <50 mL.   During this procedure medications were administered to achieve and maintain moderate conscious sedation while the patient's heart rate, blood pressure, and oxygen saturation were continuously monitored and I was present face-to-face 100% of this time.   Medications (Filter: Administrations occurring from [phone] to 0933 on 02/03/21) (important) Continuous medications are totaled by the amount administered until 02/03/21 0933.    Heparin (Porcine) in NaCl 1000-0.9 UT/500ML-% SOLN (mL) Total volume:  1,000 mL  Date/Time Rate/Dose/Volume Action   02/03/21 0841 500 mL Given   0841 500 mL Given    midazolam (VERSED) injection (mg) Total dose:  3 mg  Date/Time Rate/Dose/Volume Action   02/03/21 0844 1 mg Given   0904 1 mg Given   0909 1 mg Given    fentaNYL (SUBLIMAZE) injection (mcg) Total dose:  75 mcg  Date/Time Rate/Dose/Volume Action   02/03/21 0844 25 mcg Given   0904 25 mcg Given   0909 25 mcg Given    lidocaine (PF) (XYLOCAINE) 1 % injection (mL) Total volume:  4 mL  Date/Time Rate/Dose/Volume Action   02/03/21 0904 2 mL Given   0906 2 mL Given    Radial Cocktail/Verapamil only (mL) Total volume:  10 mL  Date/Time Rate/Dose/Volume Action   02/03/21 0910 10 mL Given    heparin sodium (porcine) injection (Units) Total dose:  4,000 Units  Date/Time Rate/Dose/Volume Action   02/03/21 0916 4,000 Units Given    iohexol (OMNIPAQUE) 350 MG/ML injection (mL) Total volume:  35 mL  Date/Time Rate/Dose/Volume Action   02/03/21 0929 35 mL Given    Sedation Time  Sedation Time Physician-1: 39 minutes 42 seconds   Contrast  Medication Name Total Dose  iohexol (OMNIPAQUE) 350 MG/ML injection 35 mL     Radiation/Fluoro  Fluoro time: 3.5 (min) DAP: 7496 (mGycm2) Cumulative Air Kerma: 637 (mGy)   Complications   Complications documented before study signed (02/03/2021 9:33 AM)     RIGHT/LEFT HEART CATH AND CORONARY ANGIOGRAPHY  None Documented by Burnell Blanks, MD 02/03/2021 9:31 AM  Date Found: 02/03/2021  Time Range: Intraprocedure       Coronary Findings   Diagnostic Dominance: Right  Left Anterior Descending  Vessel is large.  Ramus Intermedius  Vessel is moderate in size.  Left Circumflex  Vessel is large.  Right Coronary Artery  Vessel is large.   Intervention   No interventions have been documented.  Coronary Diagrams   Diagnostic Dominance: Right    Intervention    Implants    No implant documentation for this case.    Syngo Images  Show images for CARDIAC CATHETERIZATION  Images on Long Term Storage  Show images for Bufford Lope to Procedure Log  Procedure Log     Hemo Data  Flowsheet Row Most Recent Value  Fick Cardiac Output 4.2 L/min  Fick Cardiac Output Index 2.38 (L/min)/BSA  Aortic Mean Gradient 58.1 mmHg  Aortic Peak Gradient 59 mmHg  Aortic Valve Area 0.50  Aortic Value Area Index 0.28 cm2/BSA  RA A Wave 16 mmHg  RA V Wave 12 mmHg  RA Mean 11 mmHg  RV Systolic Pressure  29 mmHg  RV Diastolic Pressure 9 mmHg  RV EDP 13 mmHg  PA Systolic Pressure 32 mmHg  PA Diastolic Pressure 14 mmHg  PA Mean 21 mmHg  PW A Wave 14 mmHg  PW V Wave 12 mmHg  PW Mean 10 mmHg  AO Systolic Pressure 027 mmHg  AO Diastolic Pressure 72 mmHg  AO Mean 95 mmHg  LV Systolic Pressure 741 mmHg  LV Diastolic Pressure 14 mmHg  LV EDP 22 mmHg  AOp Systolic Pressure 287 mmHg  AOp Diastolic Pressure 79 mmHg  AOp Mean Pressure 867 mmHg  LVp Systolic Pressure 672 mmHg  LVp Diastolic Pressure 12 mmHg  LVp EDP Pressure 16 mmHg  QP/QS 1  TPVR Index 8.81 HRUI  TSVR Index 39.86 HRUI  PVR SVR Ratio 0.13   TPVR/TSVR Ratio 0.22     EKG: NSR w/out significant AV conduction delay (01/25/21)    Cardiac TAVR CT  TECHNIQUE: A non-contrast, gated CT scan was obtained with axial slices of 3 mm through the heart for aortic valve calcium scoring. A 90 kV retrospective, gated, contrast cardiac scan was obtained. Gantry rotation speed was 250 msecs and collimation was 0.6 mm. Nitroglycerin was not given. The 3D data set was reconstructed in 5% intervals of the 0-95% of the R-R cycle. Systolic and diastolic phases were analyzed on a dedicated workstation using MPR, MIP, and VRT modes. The patient received 100 cc of contrast.  FINDINGS: Image quality: Excellent.  Noise artifact is: Limited.  Valve Morphology: Tricuspid aortic valve with severely thickened leaflets. The leaflets are thickened and retracted with incomplete coaptation consistent with at least moderate aortic regurgitation. There is moderate diffuse calcifications present. The LCC is severely restricted in systole.  Aortic Valve area: 1.20-1.30 cm2  Aortic Valve Calcium score: 530  Aortic annular dimension:  Phase assessed: 10%  Annular area: 326 mm2  Annular perimeter: 65.0 mm  Max diameter: 22.1 mm  Min diameter: 18.9 mm  Annular and subannular calcification: None.  Optimal coplanar projection: LAO 15 CAU 4  Coronary Artery Height above Annulus:  Left Main: 11.5 mm  Right Coronary: 16.3 mm  Sinus of Valsalva Measurements:  Non-coronary: 25 mm  Right-coronary: 25 mm  Left-coronary: 25 mm  Sinus of Valsalva Height:  Non-coronary: 17.2 mm  Right-coronary: 19.8 mm  Left-coronary: 16.6 mm  Sinotubular Junction: 24 mm  Ascending Thoracic Aorta: 28 mm  Coronary Arteries: Normal coronary origin. Right dominance. The study was performed without use of NTG and is insufficient for plaque evaluation. Please refer to recent cardiac catheterization for coronary  assessment.  Cardiac Morphology:  Right Atrium: Right atrial size is within normal limits.  Right Ventricle: The right ventricular cavity is within normal limits.  Left Atrium: Left atrial size is normal in size with no left atrial appendage filling defect.  Left Ventricle: The ventricular cavity size is within normal limits. There are no stigmata of prior infarction. There is no abnormal filling defect. Left ventricular function is hyperdynamic, LVEF=84%. No regional wall motion abnormalities.  Pulmonary arteries: Normal in size without proximal filling defect.  Pulmonary veins: Normal pulmonary venous drainage.  Pericardium: Normal thickness with no significant effusion or calcium present.  Mitral Valve: The mitral valve is normal structure without significant calcification.  Extra-cardiac findings: See attached radiology report for non-cardiac structures.  IMPRESSION: 1. Tricuspid aortic valve with severely thickened leaflets and moderate calcifications.  2. Annular measurements are small (326 mm2). Would consider 26 mm Evolut Pro.  3. No significant annular or  subannular calcifications.  4. Sufficient coronary to annulus distance.  4. Optimal Fluoroscopic Angle for Delivery: LAO 15 CAU 4  [place] T. Audie Box, MD   Electronically Signed   By: Eleonore Chiquito   On: 02/13/2021 19:38   CT ANGIOGRAPHY CHEST, ABDOMEN AND PELVIS  TECHNIQUE: Multidetector CT imaging through the chest, abdomen and pelvis was performed using the standard protocol during bolus administration of intravenous contrast. Multiplanar reconstructed images and MIPs were obtained and reviewed to evaluate the vascular anatomy.  CONTRAST:  146mL OMNIPAQUE IOHEXOL 350 MG/ML SOLN  COMPARISON:  CT the abdomen and pelvis 12/23/2013.  FINDINGS: CTA CHEST FINDINGS  Cardiovascular: Heart size is normal. There is no significant pericardial fluid, thickening or pericardial  calcification. No atherosclerotic calcifications in the thoracic aorta or the coronary arteries. Severe thickening and calcification of the aortic valve.  Mediastinum/Lymph Nodes: No pathologically enlarged mediastinal or hilar lymph nodes. Please note that accurate exclusion of hilar adenopathy is limited on noncontrast CT scans. Esophagus is unremarkable in appearance. No axillary lymphadenopathy.  Lungs/Pleura: No suspicious pulmonary nodules or masses are noted. No acute consolidative airspace disease. No pleural effusions.  Musculoskeletal/Soft Tissues: There are no aggressive appearing lytic or blastic lesions noted in the visualized portions of the skeleton. Spinal cord stimulator in the midthoracic region incidentally noted.  CTA ABDOMEN AND PELVIS FINDINGS  Hepatobiliary: No suspicious cystic or solid hepatic lesions. No intra or extrahepatic biliary ductal dilatation. Status post cholecystectomy.  Pancreas: No pancreatic mass. No pancreatic ductal dilatation. No pancreatic or peripancreatic fluid collections or inflammatory changes.  Spleen: Unremarkable.  Adrenals/Urinary Tract: Bilateral kidneys and bilateral adrenal glands are normal in appearance. No hydroureteronephrosis.  Stomach/Bowel: Normal appearance of the stomach. No pathologic dilatation of small bowel or colon. The appendix is not confidently identified and may be surgically absent. Regardless, there are no inflammatory changes noted adjacent to the cecum to suggest the presence of an acute appendicitis at this time.  Vascular/Lymphatic: Minimal aortic atherosclerosis, without evidence of aneurysm or dissection noted in the abdominal or pelvic vasculature. Vascular findings and measurements pertinent to potential TAVR procedure, as detailed below. No lymphadenopathy noted in the abdomen or pelvis.  Reproductive: Status post hysterectomy. Ovaries are not confidently identified may be  surgically absent or atrophic.  Other: No significant volume of ascites.  No pneumoperitoneum.  Musculoskeletal: There are no aggressive appearing lytic or blastic lesions noted in the visualized portions of the skeleton.  VASCULAR MEASUREMENTS PERTINENT TO TAVR:  AORTA:  Minimal Aortic Diameter-11 x 12 mm  Severity of Aortic Calcification-mild  RIGHT PELVIS:  Right Common Iliac Artery -  Minimal Diameter-7.3 x 6.6 mm  Tortuosity - mild  Calcification-none  Right External Iliac Artery -  Minimal Diameter-5.5 x 6.1 mm  Tortuosity - mild  Calcification - none  Right Common Femoral Artery -  Minimal Diameter-5.6 x 6.0 mm  Tortuosity - mild  Calcification - none  LEFT PELVIS:  Left Common Iliac Artery -  Minimal Diameter-6.6 x 7.1 mm  Tortuosity - mild  Calcification - none  Left External Iliac Artery -  Minimal Diameter-5.8 x 6.1 mm  Tortuosity - mild  Calcification - none  Left Common Femoral Artery -  Minimal Diameter-6.5 x 6.0 mm  Tortuosity-mild  Calcification - none  Review of the MIP images confirms the above findings.  IMPRESSION: 1. Vascular findings and measurements pertinent to potential TAVR procedure, as detailed above. 2. Severe thickening calcification of the aortic valve, compatible with reported clinical history of severe aortic  stenosis. 3. Mild aortic atherosclerosis. 4. Additional incidental findings, as above.   Electronically Signed   By: Vinnie Langton M.D.   On: 02/10/2021 13:20    Impression:  Patient has stage D1 severe symptomatic aortic stenosis.  She describes chronic progression of symptoms of exertional shortness of breath and fatigue consistent with chronic diastolic congestive heart failure, [place] Heart Association functional class II.  I have personally reviewed the patient's recent transthoracic echocardiogram, diagnostic cardiac catheterization, EKG, and CT  angiograms.  Echocardiogram performed January 10, 2021 reveals normal left ventricular systolic function with ejection fraction estimated 65 to 70%.  The aortic valve is trileaflet with moderate to severe thickening and calcification involving all 3 leaflets.  Peak velocity across aortic valve measured 3.6 m/s corresponding to mean transvalvular gradient estimated 34 mmHg and aortic valve area calculated 0.94 cm by VTI.  Stroke-volume index was 38 and DVI reported 0.37.  Patient had concomitant moderate aortic insufficiency with aortic valve pressure half-time reported 356 ms.  Of note, the aortic valve diameter is relatively small.  Diagnostic cardiac catheterization performed Feb 03, 2021 revealed nonobstructive coronary artery disease but confirmed the presence of severe aortic stenosis with mean transvalvular gradient measured at catheterization reported 58 mmHg corresponding to aortic valve area calculated only 0.50 cm.  EKG reveals sinus rhythm with no significant AV conduction delay.  Cardiac-gated CTA of the heart reveals anatomical characteristics consistent with aortic stenosis suitable for treatment by transcatheter aortic valve replacement without any significant complicating features other than the presence of a relatively small sized aortic root and aortic annulus. CTA of the aorta and iliac vessels demonstrate what appears to be adequate pelvic vascular access to facilitate a transfemoral approach.  I agree the patient would benefit from aortic valve replacement.  Risks associated with conventional surgery would be relatively low although somewhat elevated by the fact that aortic root enlargement or root replacement might be necessary to avoid the development of patient prosthesis mismatch using conventional aortic valve replacement with a stented bioprosthetic tissue valve or a mechanical valve.  Under the circumstances it would be reasonable to consider transcatheter aortic valve replacement using  a self-expanding valve such as the [employer]tronic evolute pro transcatheter heart valve.  The patient's femoral arteries are relatively small in diameter and may not accommodate a dry seal sheath, requiring use of inline sheath for valve delivery and appointment.    Plan:  The patient and her friend Leland Johns were counseled at length regarding treatment alternatives for management of severe aortic stenosis including continued medical therapy versus proceeding with aortic valve replacement in the near future.  The natural history of aortic stenosis was reviewed, as was long term prognosis with medical therapy alone.  Surgical options were discussed at length including conventional surgical aortic valve replacement through either a full median sternotomy or using minimally invasive techniques.  Other alternatives including transcatheter aortic valve replacement, conventional surgery with patch enlargement of the aortic root, stentless porcine aortic root replacement were discussed.  The patient is not interested in conventional surgery and desires to proceed with transcatheter aortic valve replacement in the near future.  We also discussed the importance of dental hygiene and the need for long-term antibiotic prophylaxis following valve replacement for any dental procedures including routine cleanings.  The patient has a broken tooth without any obvious signs or symptoms of ongoing infection.  We discussed the timing of surgery and at this point the patient desires to contact her dentist to have the  remaining portion of her broken tooth removed prior to proceeding with elective valve replacement.  Once this has been accomplished we will make plans to proceed with elective transcatheter aortic valve replacement in the near future.      I spent in excess of 90 minutes during the conduct of this office consultation and >50% of this time involved direct face-to-face encounter with the patient for counseling and/or  coordination of their care.      Valentina Gu. Roxy Manns, MD 02/24/2021 11:26 AM

## 2021-03-04 ENCOUNTER — Telehealth: Payer: Self-pay

## 2021-03-04 NOTE — Telephone Encounter (Signed)
  [facility]   MULTIDISCIPLINARY HEART VALVE TEAM   I contacted the pt in regards to getting an update on her dental treatment.  At this time the pt does not want her tooth pulled.  She is continuing to contact dental offices to see if she can find a dentist who will treat her tooth.  The pt will contact me once she has had her broken tooth treated.

## 2021-03-17 NOTE — Telephone Encounter (Signed)
I contacted the pt and she was evaluated yesterday by dentist Dr Aquilla Solian with [facility] in [place] [phone]).  The pt said she has a number of teeth that have infection and that Dr Andree Elk has made a phone call to a dentist here at the hospital in regards to treatment and he is awaiting a return call to discuss plan.  The pt is aware that Dr Roxy Manns will be leaving [facility] in July and I advised her that the last date he will be performing TAVR is 7/12.  The pt would like to have her dental treatment completed by that time if possible but I advised her if she cannot then the TAVR team will still be able to complete her surgery with another physician. Pt was very appreciative of call and said that she will contact me once she knows the recommendation from Dr Andree Elk.

## 2021-03-21 ENCOUNTER — Other Ambulatory Visit: Payer: Self-pay

## 2021-03-21 DIAGNOSIS — I350 Nonrheumatic aortic (valve) stenosis: Secondary | ICD-10-CM

## 2021-03-21 DIAGNOSIS — K089 Disorder of teeth and supporting structures, unspecified: Secondary | ICD-10-CM

## 2021-03-23 DIAGNOSIS — M5416 Radiculopathy, lumbar region: Secondary | ICD-10-CM | POA: Diagnosis not present

## 2021-03-23 DIAGNOSIS — G894 Chronic pain syndrome: Secondary | ICD-10-CM | POA: Diagnosis not present

## 2021-03-23 DIAGNOSIS — I10 Essential (primary) hypertension: Secondary | ICD-10-CM | POA: Diagnosis not present

## 2021-03-24 ENCOUNTER — Other Ambulatory Visit (HOSPITAL_COMMUNITY): Payer: Medicare Other | Admitting: Dentistry

## 2021-03-29 ENCOUNTER — Ambulatory Visit (INDEPENDENT_AMBULATORY_CARE_PROVIDER_SITE_OTHER): Payer: 59 | Admitting: Dentistry

## 2021-03-29 ENCOUNTER — Other Ambulatory Visit: Payer: Self-pay

## 2021-03-29 ENCOUNTER — Encounter (HOSPITAL_COMMUNITY): Payer: Self-pay | Admitting: Dentistry

## 2021-03-29 DIAGNOSIS — F40232 Fear of other medical care: Secondary | ICD-10-CM

## 2021-03-29 DIAGNOSIS — Z01818 Encounter for other preprocedural examination: Secondary | ICD-10-CM | POA: Diagnosis not present

## 2021-03-29 DIAGNOSIS — M264 Malocclusion, unspecified: Secondary | ICD-10-CM

## 2021-03-29 DIAGNOSIS — K0530 Chronic periodontitis, unspecified: Secondary | ICD-10-CM

## 2021-03-29 DIAGNOSIS — K036 Deposits [accretions] on teeth: Secondary | ICD-10-CM

## 2021-03-29 DIAGNOSIS — K0889 Other specified disorders of teeth and supporting structures: Secondary | ICD-10-CM

## 2021-03-29 DIAGNOSIS — K117 Disturbances of salivary secretion: Secondary | ICD-10-CM

## 2021-03-29 DIAGNOSIS — K031 Abrasion of teeth: Secondary | ICD-10-CM

## 2021-03-29 DIAGNOSIS — K029 Dental caries, unspecified: Secondary | ICD-10-CM

## 2021-03-29 DIAGNOSIS — K032 Erosion of teeth: Secondary | ICD-10-CM

## 2021-03-29 DIAGNOSIS — K083 Retained dental root: Secondary | ICD-10-CM

## 2021-03-29 DIAGNOSIS — K08109 Complete loss of teeth, unspecified cause, unspecified class: Secondary | ICD-10-CM

## 2021-03-29 DIAGNOSIS — K06020 Generalized gingival recession, unspecified: Secondary | ICD-10-CM

## 2021-03-29 DIAGNOSIS — I350 Nonrheumatic aortic (valve) stenosis: Secondary | ICD-10-CM | POA: Diagnosis not present

## 2021-03-29 DIAGNOSIS — K045 Chronic apical periodontitis: Secondary | ICD-10-CM

## 2021-03-29 DIAGNOSIS — K0850 Unsatisfactory restoration of tooth, unspecified: Secondary | ICD-10-CM

## 2021-03-29 DIAGNOSIS — K030 Excessive attrition of teeth: Secondary | ICD-10-CM
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
## 2021-03-29 NOTE — Patient Instructions (Signed)
Department of Dental Medicine [facility] B. Ronni Osterberg, D.M.D. Phone: [phone] Fax: [phone]   It was a pleasure seeing you today!  Please refer to the information below regarding your dental visit with us.  Call us if any questions or concerns come up after you leave.   Thank you for letting us provide care for you.  If there is anything we can do for you, please let us know.    HEART VALVES AND MOUTH CARE   FACTS: If you have any infection in your mouth, it can infect your heart valve. If you heart valve is infected, you will be seriously ill. Infections in the mouth can be SILENT and do not always cause pain. Examples of infections in the mouth are gum disease, dental cavities, and abscesses. Some possible signs of infection are: Bad breath, bleeding gums, or teeth that are sensitive to sweets, hot, and/or cold. There are many other signs as well.   WHAT YOU HAVE TO DO: Brush your teeth after meals and at bedtime.  Spend at least 2 minutes brushing well, especially behind your back teeth and all around your teeth that stand alone.  Brush at the gumline also. Do not go to bed without brushing your teeth and flossing. If your gums bleed when you brush or floss, do NOT stop brushing or flossing.  Bleeding can be a sign of inflammation or irritation from bacteria.  It usually means that your gums need more attention and better cleaning.  If your dentist or Dr. Gwenevere Goga gave you a prescription mouthwash to use, make sure to use it as directed. If you run out of the medication, get a refill at the pharmacy. If you were given any other medications or directions by your dentist, please follow them.  If you did not understand the directions or forget what you were told, please call.  We will be happy to refresh your memory. If you need antibiotics before dental procedures, make sure you take them one hour prior to every dental visit as directed.  Get a dental check-up every 4-6  months in order to keep your mouth healthy, or to find and treat any new infection. You will most likely need your teeth cleaned or gums treated at the same time. If you are not able to come in for your scheduled appointment, call your dentist as soon as possible to reschedule. If you have a problem in between dental visits, call your dentist.   QUESTIONS? Call our office during office hours [phone].    WE ARE A TEAM.  OUR GOAL IS:  HEALTHY MOUTH, HEALTHY HEART   

## 2021-03-30 ENCOUNTER — Encounter (HOSPITAL_COMMUNITY): Payer: Self-pay | Admitting: Physician Assistant

## 2021-03-30 ENCOUNTER — Encounter (HOSPITAL_COMMUNITY): Payer: Self-pay | Admitting: Dentistry

## 2021-03-30 ENCOUNTER — Telehealth: Payer: Self-pay | Admitting: Physician Assistant

## 2021-03-30 NOTE — Telephone Encounter (Signed)
  [place] VALVE TEAM  Patient is scheduled for dental extractions tomorrow but called in saying she is feeling sick to her stomach and wants to cancel. She has had diarrhea and nausea. She also feels very emotional about losing her teeth and says that she will have "no dignity." I have asked her to call Dr. Raynelle Dick office to cancel her extractions and r/s. I have also explained that she will not be able to proceed with valve surgery until she has completed dental extraction. She voiced understanding.    Angelena Form PA-C  [facility]

## 2021-03-30 NOTE — Progress Notes (Signed)
DUE TO COVID-19 ONLY ONE VISITOR IS ALLOWED TO COME WITH YOU AND STAY IN THE WAITING ROOM ONLY DURING PRE OP AND PROCEDURE DAY OF SURGERY.   PCP - Dr Ethlyn Gallery Cardiologist - n/a  CT Chest x-ray - 02/10/21 EKG - 01/25/21 Stress Test - n/a ECHO - 01/10/21 Cardiac Cath - 02/03/21  Sleep Study -  n/a CPAP - none  Anesthesia review: Yes  STOP now taking any Aspirin (unless otherwise instructed by your surgeon), Aleve, Naproxen, Ibuprofen, Motrin, Advil, Goody's, BC's, all herbal medications, fish oil, and all vitamins.   Coronavirus Screening Covid test n/a - Ambulatory Surgery  Do you have any of the following symptoms:  Cough yes/no: No Fever (>100.15F)  yes/no: No Runny nose yes/no: No Sore throat yes/no: No Difficulty breathing/shortness of breath  yes/no: No  Have you traveled in the last 14 days and where? yes/no: Yes  [place]  Patient verbalized understanding of instructions that were given via phone.

## 2021-03-31 ENCOUNTER — Encounter (HOSPITAL_COMMUNITY): Admission: RE | Payer: Self-pay | Source: Home / Self Care

## 2021-03-31 ENCOUNTER — Ambulatory Visit (HOSPITAL_COMMUNITY): Admission: RE | Admit: 2021-03-31 | Payer: Medicare Other | Source: Home / Self Care | Admitting: Dentistry

## 2021-03-31 HISTORY — DX: Anxiety disorder, unspecified: F41.9

## 2021-03-31 HISTORY — DX: Hyperlipidemia, unspecified: E78.5

## 2021-03-31 SURGERY — MULTIPLE EXTRACTION WITH ALVEOLOPLASTY
Anesthesia: General

## 2021-04-08 ENCOUNTER — Ambulatory Visit: Payer: Medicare Other | Admitting: Physical Therapy

## 2021-04-08 ENCOUNTER — Other Ambulatory Visit (HOSPITAL_COMMUNITY): Payer: Medicare Other

## 2021-04-20 DIAGNOSIS — M5416 Radiculopathy, lumbar region: Secondary | ICD-10-CM | POA: Diagnosis not present

## 2021-04-20 DIAGNOSIS — I10 Essential (primary) hypertension: Secondary | ICD-10-CM | POA: Diagnosis not present

## 2021-04-20 DIAGNOSIS — G894 Chronic pain syndrome: Secondary | ICD-10-CM | POA: Diagnosis not present

## 2021-04-25 ENCOUNTER — Other Ambulatory Visit (HOSPITAL_COMMUNITY): Payer: Medicaid Other

## 2021-05-02 ENCOUNTER — Telehealth: Payer: Self-pay

## 2021-05-02 NOTE — Telephone Encounter (Signed)
  [facility]   MULTIDISCIPLINARY HEART VALVE TEAM  I attempted to reach the pt to get an update on her plans in regards to dental extraction.  The pt had planned to get this rescheduled but I do not see any documentation that she contacted Dr Raynelle Dick office.  I left the pt a message to call the office to discuss plans in regards to extraction and TAVR.

## 2021-05-10 ENCOUNTER — Other Ambulatory Visit: Payer: Self-pay | Admitting: Internal Medicine

## 2021-05-12 ENCOUNTER — Encounter: Payer: Self-pay | Admitting: Physician Assistant

## 2021-05-12 ENCOUNTER — Ambulatory Visit (INDEPENDENT_AMBULATORY_CARE_PROVIDER_SITE_OTHER): Payer: Medicare Other | Admitting: Physician Assistant

## 2021-05-12 ENCOUNTER — Other Ambulatory Visit: Payer: Self-pay

## 2021-05-12 VITALS — BP 110/70 | HR 87 | Ht 64.0 in | Wt 156.0 lb

## 2021-05-12 DIAGNOSIS — E785 Hyperlipidemia, unspecified: Secondary | ICD-10-CM

## 2021-05-12 DIAGNOSIS — K089 Disorder of teeth and supporting structures, unspecified: Secondary | ICD-10-CM | POA: Diagnosis not present

## 2021-05-12 DIAGNOSIS — I350 Nonrheumatic aortic (valve) stenosis: Secondary | ICD-10-CM | POA: Diagnosis not present
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
## 2021-05-12 NOTE — Patient Instructions (Signed)
Medication Instructions:  No changes *If you need a refill on your cardiac medications before your next appointment, please call your pharmacy*   Lab Work: none If you have labs (blood work) drawn today and your tests are completely normal, you will receive your results only by: . MyChart Message (if you have MyChart) OR . A paper copy in the mail If you have any lab test that is abnormal or we need to change your treatment, we will call you to review the results.   Testing/Procedures: none   Follow-Up: As planned  Other Instructions   

## 2021-05-14 ENCOUNTER — Other Ambulatory Visit: Payer: Self-pay | Admitting: Family Medicine

## 2021-05-19 DIAGNOSIS — H905 Unspecified sensorineural hearing loss: Secondary | ICD-10-CM | POA: Diagnosis not present

## 2021-05-23 DIAGNOSIS — M5416 Radiculopathy, lumbar region: Secondary | ICD-10-CM | POA: Diagnosis not present

## 2021-05-23 DIAGNOSIS — Z79891 Long term (current) use of opiate analgesic: Secondary | ICD-10-CM | POA: Diagnosis not present

## 2021-05-23 DIAGNOSIS — G894 Chronic pain syndrome: Secondary | ICD-10-CM | POA: Diagnosis not present

## 2021-05-23 DIAGNOSIS — Z79899 Other long term (current) drug therapy: Secondary | ICD-10-CM | POA: Diagnosis not present

## 2021-05-24 IMAGING — CT CT HEART MORP W/ CTA COR W/ SCORE W/ CA W/CM &/OR W/O CM
2 of 7 series · 11 of 20 positions shown, 13 images · IV contrast (APPLIED)
Comparison: None.
COMPARISON: None.

Addendum:
EXAM:
OVER-READ INTERPRETATION  CT CHEST

The following report is an over-read performed by radiologist Dr.
Abdolali Err [[email or web address]] on 02/10/2021. This
over-read does not include interpretation of cardiac or coronary
anatomy or pathology. The coronary calcium score/coronary CTA
interpretation by the cardiologist is attached.
CLINICAL DATA: Severe Aortic Stenosis.
Cardiac TAVR CT
TECHNIQUE: A non-contrast, gated CT scan was obtained with axial slices of 3 mm
through the heart for aortic valve calcium scoring. A 90 kV
retrospective, gated, contrast cardiac scan was obtained. Gantry
rotation speed was 250 msecs and collimation was 0.6 mm.
Nitroglycerin was not given. The 3D data set was reconstructed in 5%
intervals of the 0-95% of the R-R cycle. Systolic and diastolic
phases were analyzed on a dedicated workstation using MPR, MIP, and
VRT modes. The patient received 100 cc of contrast.

[Series 7: 0-90% · axial · 0.39mm/px · z∈[+1063,+1180]mm · 5 of 2910 slices shown]
[im 485/2910  vessel]
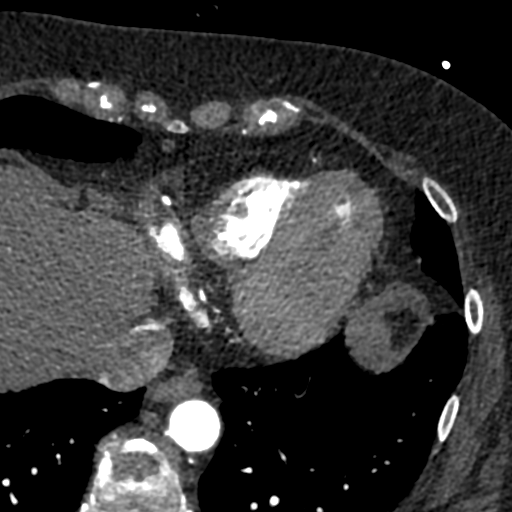
[im 970/2910  vessel]
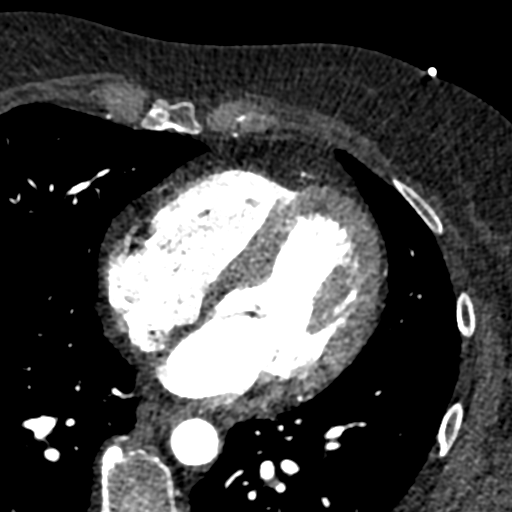
[im 1455/2910  vessel]
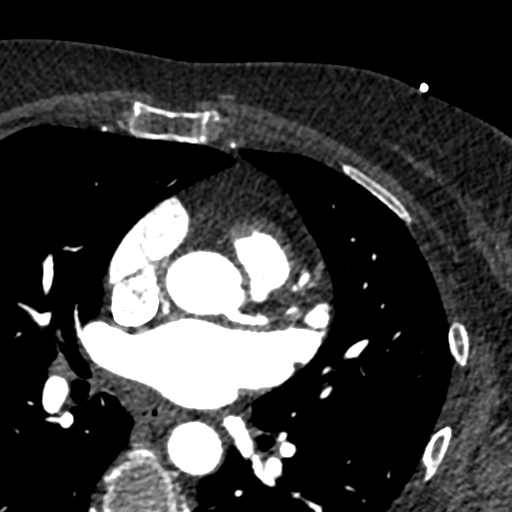
[im 1940/2910  vessel]
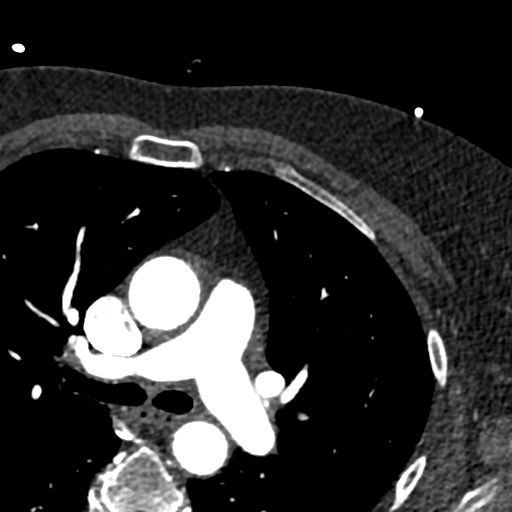
[im 2425/2910  vessel]
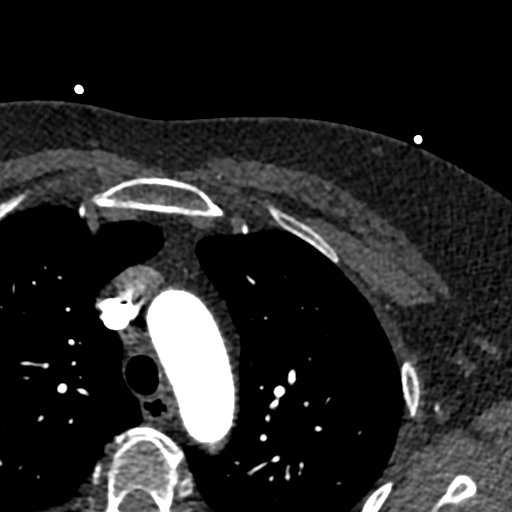

[Series 8: 5-95% · axial · 0.39mm/px · z∈[+1059,+1184]mm · 6 of 2910 slices shown, 8 images]
[im 416/2910  vessel]
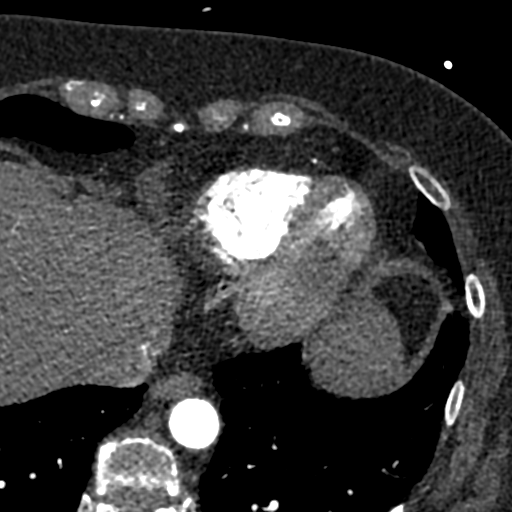
[im 416/2910  lung]
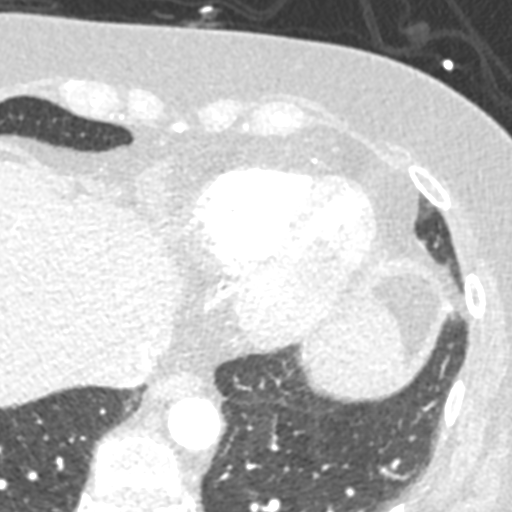
[im 832/2910  vessel]
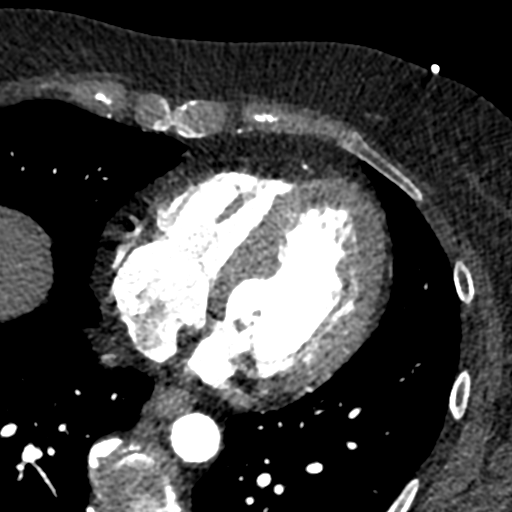
[im 1247/2910  vessel]
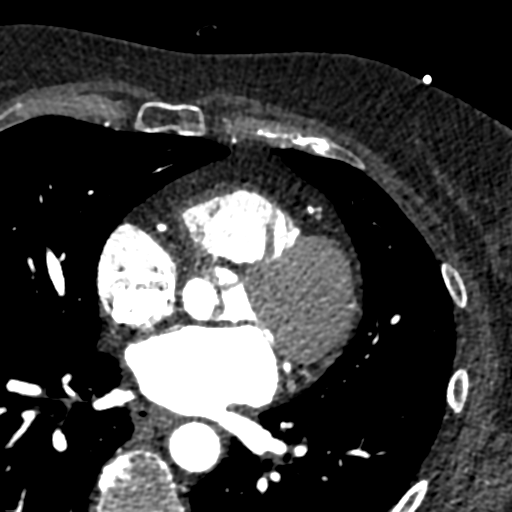
[im 1663/2910  vessel]
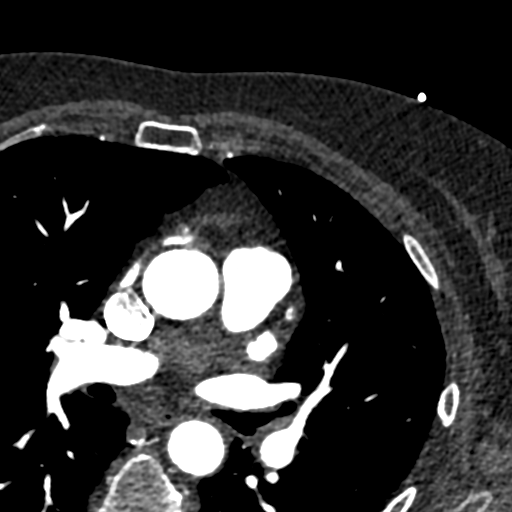
[im 2078/2910  vessel]
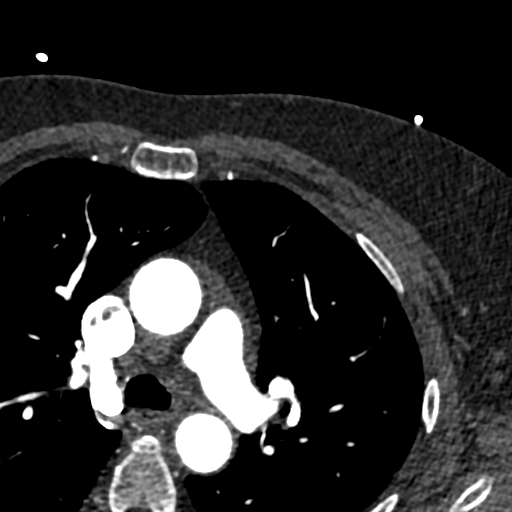
[im 2078/2910  lung]
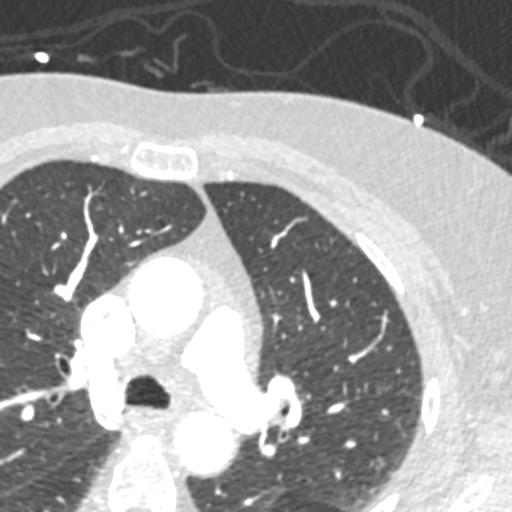
[im 2494/2910  vessel]
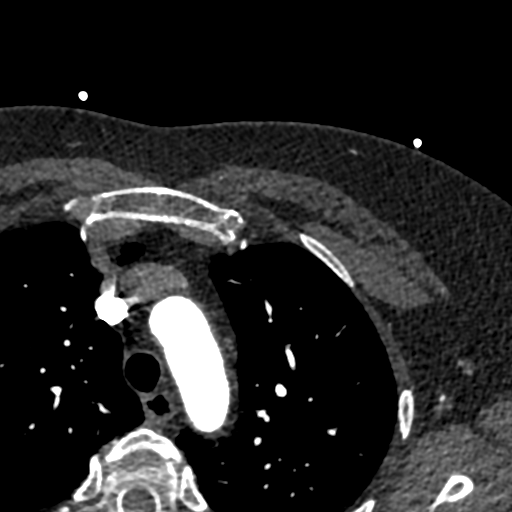

[11 of 20 positions shown; findings below may reference images not displayed]

FINDINGS: Extracardiac findings will be described separately under dictation
for contemporaneously obtained CTA chest, abdomen and pelvis.
IMPRESSION: Please see separate dictation for contemporaneously obtained CTA
chest, abdomen and pelvis dated 02/10/2021 for full description of
relevant extracardiac findings.
FINDINGS: Image quality: Excellent.

Noise artifact is: Limited.

Valve Morphology: Tricuspid aortic valve with severely thickened
leaflets. The leaflets are thickened and retracted with incomplete
coaptation consistent with at least moderate aortic regurgitation.
There is moderate diffuse calcifications present. The LCC is
severely restricted in systole.

Aortic Valve area: 1.20-1.30 cm2

Aortic Valve Calcium score: 530

Aortic annular dimension:

Phase assessed: 10%

Annular area: 326 mm2

Annular perimeter: 65.0 mm

Max diameter: 22.1 mm

Min diameter: 18.9 mm

Annular and subannular calcification: None.

Optimal coplanar projection: LAO 15 SHEDRICK 4

Coronary Artery Height above Annulus:

Left Main: 11.5 mm

Right Coronary: 16.3 mm

Sinus of Valsalva Measurements:

Non-coronary: 25 mm

Right-coronary: 25 mm

Left-coronary: 25 mm

Sinus of Valsalva Height:

Non-coronary: 17.2 mm

Right-coronary: 19.8 mm

Left-coronary: 16.6 mm

Sinotubular Junction: 24 mm

Ascending Thoracic Aorta: 28 mm

Coronary Arteries: Normal coronary origin. Right dominance. The
study was performed without use of NTG and is insufficient for
plaque evaluation. Please refer to recent cardiac catheterization
for coronary assessment.

Cardiac Morphology:

Right Atrium: Right atrial size is within normal limits.

Right Ventricle: The right ventricular cavity is within normal
limits.

Left Atrium: Left atrial size is normal in size with no left atrial
appendage filling defect.

Left Ventricle: The ventricular cavity size is within normal limits.
There are no stigmata of prior infarction. There is no abnormal
filling defect. Left ventricular function is hyperdynamic, LVEF=84%.
No regional wall motion abnormalities.

Pulmonary arteries: Normal in size without proximal filling defect.

Pulmonary veins: Normal pulmonary venous drainage.

Pericardium: Normal thickness with no significant effusion or
calcium present.

Mitral Valve: The mitral valve is normal structure without
significant calcification.

Extra-cardiac findings: See attached radiology report for
non-cardiac structures.
IMPRESSION: 1. Tricuspid aortic valve with severely thickened leaflets and
moderate calcifications.

2. Annular measurements are small (326 mm2). Would consider 26 mm
Evolut Pro.

3. No significant annular or subannular calcifications.

4. Sufficient coronary to annulus distance.

4. Optimal Fluoroscopic Angle for Delivery: LAO 15 SHEDRICK 4

*** End of Addendum ***
EXAM:
OVER-READ INTERPRETATION  CT CHEST

The following report is an over-read performed by radiologist Dr.
Abdolali Err [REDACTED] on 02/10/2021. This
over-read does not include interpretation of cardiac or coronary
anatomy or pathology. The coronary calcium score/coronary CTA
interpretation by the cardiologist is attached.
FINDINGS: Extracardiac findings will be described separately under dictation
for contemporaneously obtained CTA chest, abdomen and pelvis.
IMPRESSION: Please see separate dictation for contemporaneously obtained CTA
chest, abdomen and pelvis dated 02/10/2021 for full description of
relevant extracardiac findings.

## 2021-05-25 ENCOUNTER — Encounter (HOSPITAL_COMMUNITY): Payer: Self-pay | Admitting: Dentistry

## 2021-05-25 NOTE — Progress Notes (Signed)
Anesthesia Chart Review: [name] [name]   Case: [id number] Date/Time: 05/26/21 0715   Procedure: MULTIPLE EXTRACTION WITH ALVEOLOPLASTY   Anesthesia type: General   Pre-op diagnosis: DENTAL CARIES   Location: [facility] OR ROOM 08 / [place] [facility]   Surgeons: Charlaine Dalton, DMD       DISCUSSION: Patient is a 65 year old female scheduled for the above procedure.  She is being considered for TAVR for severe AS.  H&P completed 05/12/2021 by Angelena Form, PA-C.  History includes never smoker, postoperative N/V, severe AS, HLD, GERD, hiatal hernia, IBS, fibromyalgia, anxiety, depression (with history of SI), elevated LFTs (2015). She reported failed conscious sedation (woke up during procedure such as endoscopy and colonoscopy), back surgery (spinal cord implant 2005).  She is for labs as indicated and anesthesia team evaluation on the day of procedure.   VS:  BP Readings from Last 3 Encounters:  05/12/21 110/70  03/29/21 (!) 152/81  02/24/21 (!) 150/78   Pulse Readings from Last 3 Encounters:  05/12/21 87  03/29/21 92  02/24/21 88      PROVIDERS: Caren Macadam, MD is PCP  Pixie Casino, MD is primary cardiologist Lauree Chandler, MD is structural heart cardiologist   LABS: Last lab results include: Lab Results  Component Value Date   WBC 7.5 01/25/2021   HGB 13.6 02/03/2021   HCT 40.0 02/03/2021   PLT 310 01/25/2021   GLUCOSE 104 (H) 01/25/2021   NA 143 02/03/2021   K 3.9 02/03/2021   CL 102 01/25/2021   CREATININE 1.01 (H) 01/25/2021   BUN 9 01/25/2021   CO2 19 (L) 01/25/2021    IMAGES: CTA chest/abd/pelvis 02/10/21: IMPRESSION: 1. Vascular findings and measurements pertinent to potential TAVR procedure, as detailed above. [See full report] 2. Severe thickening calcification of the aortic valve, compatible with reported clinical history of severe aortic stenosis. 3. Mild aortic atherosclerosis. 4. Additional incidental findings, as above. [See full  report]    EKG: 01/25/21: SR   CV: US Carotid 02/10/21: Summary:  - Right Carotid: The extracranial vessels were near-normal with only minimal wall thickening or plaque.  - Left Carotid: The extracranial vessels were near-normal with only minimal wall thickening or plaque.  - Vertebrals:  Bilateral vertebral arteries demonstrate antegrade flow.  - Subclavians: Normal flow hemodynamics were seen in bilateral subclavian arteries.   CT Coronary 02/10/21: IMPRESSION: 1. Tricuspid aortic valve with severely thickened leaflets and moderate calcifications. 2. Annular measurements are small (326 mm2). Would consider 26 mm Evolut Pro. 3. No significant annular or subannular calcifications. 4. Sufficient coronary to annulus distance. 5. Optimal Fluoroscopic Angle for Delivery: LAO 15 CAU 4  Cardiac cath 02/03/21: 1. No angiographic evidence of CAD 2. Severe aortic stenosis with mean gradient 58 mmHg, peak to peak gradient 59 mmHg, AVA 0.50 cm2 - Will continue workup for TAVR  Echo 01/10/21: IMPRESSIONS   1. Left ventricular ejection fraction, by estimation, is 65 to 70%. The  left ventricle has normal function. The left ventricle has no regional  wall motion abnormalities. There is mild left ventricular hypertrophy.  Left ventricular diastolic parameters  are consistent with Grade I diastolic dysfunction (impaired relaxation).  The average left ventricular global longitudinal strain is -13.8 %. The  global longitudinal strain is abnormal.   2. Right ventricular systolic function is normal. The right ventricular  size is normal. Tricuspid regurgitation signal is inadequate for assessing  PA pressure.   3. The mitral valve is normal in structure. No evidence of  mitral valve  regurgitation. No evidence of mitral stenosis.   4. The aortic valve is tricuspid. Aortic valve regurgitation is mild.  Severe aortic valve stenosis (severe by continuity equation AVA, moderate  by mean gradient).  Aortic valve area, by VTI measures 0.94 cm. Aortic  valve mean gradient measures 34.0  mmHg.   5. The inferior vena cava is normal in size with greater than 50%  respiratory variability, suggesting right atrial pressure of 3 mmHg.   Past Medical History:  Diagnosis Date   ABDOMINAL PAIN, LOWER 02/16/2009   Anxiety    APPENDECTOMY, HX OF 09/11/2007   ATTENTION DEFICIT DISORDER, ADULT 02/02/2009   BACK PAIN 07/16/2007   chronic   Back pain    spinal stimulator in place   CERVICAL RADICULOPATHY, RIGHT 07/16/2007   CHEST PAIN, ACUTE 09/03/2008   Chronic abdominal pain    Chronic pain syndrome [id number]   Complication of anesthesia    woke up during surgeries few times and woke up during endoscopy and colonscopy few weeks ago, in terrible pain   DDD (degenerative disc disease), lumbosacral    DEPRESSION/ANXIETY 06/17/2010   Dysuria 02/20/2008   Elevated liver enzymes 1 and  1/2 months ago   Family history of anesthesia complication    mother woke up during surgeries   Fibromyalgia    Functional GI symptoms    FX, RAMUS NOS, CLOSED 07/16/2007   GERD (gastroesophageal reflux disease)    no current problems   H/O failed conscious sedation    PT STATES SHE IS HARD TO SEDATE!   HEMORRHOIDS 09/11/2007   HIATAL HERNIA, HX OF 09/11/2007   History of kidney stones    passed stones   HX, PERSONAL, MUSCULOSKELETAL DISORD NEC 07/16/2007   HX, PERSONAL, URINARY CALCULI 07/16/2007   Hyperlipidemia    Irritable bowel syndrome 09/11/2007   NEOPLASM, SKIN, UNCERTAIN BEHAVIOR [id number]   OSTEOARTHRITIS 09/11/2007   oa   OSTEOPENIA 10/23/2007   PONV (postoperative nausea and vomiting)    ROTATOR CUFF REPAIR, HX OF 09/11/2007   Suicidal ideations    SYMPTOM, PAIN, ABDOMINAL, RIGHT UP QUADRANT 07/16/2007   TAH/BSO, HX OF 09/11/2007   UTI 10/06/2010   VAGINITIS, ATROPHIC, POSTMENOPAUSAL 07/16/2007    Past Surgical History:  Procedure Laterality Date   ABDOMINAL HYSTERECTOMY  1976    removed due to abnormal paps; no uterine cancer   [place]   bone pushed back into  place after fracture Left 25-30 yrs ago   face   cervical radiculopathy  20 yrs ago   RT   CHOLECYSTECTOMY N/A 02/18/2014   Procedure: LAPAROSCOPIC CHOLECYSTECTOMY;  Surgeon: Harl Bowie, MD;  Location: [facility];  Service: General;  Laterality: N/A;   COLONOSCOPY     DILATION AND CURETTAGE OF UTERUS  age 66   ESOPHAGOGASTRODUODENOSCOPY     ORIF ANKLE FRACTURE Left 07/12/2017   Procedure: OPEN REDUCTION INTERNAL FIXATION (ORIF) LEFT ANKLE FRACTURE;  Surgeon: Carole Civil, MD;  Location: [facility];  Service: Orthopedics;  Laterality: Left;   RIGHT/LEFT HEART CATH AND CORONARY ANGIOGRAPHY N/A 02/03/2021   Procedure: RIGHT/LEFT HEART CATH AND CORONARY ANGIOGRAPHY;  Surgeon: Burnell Blanks, MD;  Location: [facility];  Service: Cardiovascular;  Laterality: N/A;   [place]   right   SPINAL CORD STIMULATOR IMPLANT  2005   lower back, pt turned off 2-3 weeks ago due to causing pain   TUBAL LIGATION  MEDICATIONS: No current facility-administered medications for this encounter.    ALPRAZolam (XANAX) 1 MG tablet   amphetamine-dextroamphetamine (ADDERALL) 30 MG tablet   cyclobenzaprine (FLEXERIL) 10 MG tablet   diclofenac Sodium (VOLTAREN) 1 % GEL   docusate sodium (COLACE) 100 MG capsule   gabapentin (NEURONTIN) 300 MG capsule   guaiFENesin (MUCINEX) 600 MG 12 hr tablet   hydrOXYzine (ATARAX/VISTARIL) 25 MG tablet   ibuprofen (ADVIL) 200 MG tablet   ondansetron (ZOFRAN-ODT) 4 MG disintegrating tablet   Oxycodone HCl 10 MG TABS   polyethylene glycol (MIRALAX / GLYCOLAX) 17 g packet   promethazine (PHENERGAN) 25 MG suppository   rosuvastatin (CRESTOR) 20 MG tablet   VIIBRYD 40 MG TABS   Heating Pad PADS   metoprolol tartrate (LOPRESSOR) 100 MG tablet    Myra Gianotti, PA-C Surgical Short Stay/Anest[facility]iology [facility]  Phone [phone] [facility] Phone [phone] 05/25/2021 12:39 PM

## 2021-05-25 NOTE — Anesthesia Preprocedure Evaluation (Addendum)
Anesthesia Evaluation  Patient identified by MRN, date of birth, ID band Patient awake    Reviewed: Allergy & Precautions, NPO status , Patient's Chart, lab work & pertinent test results, reviewed documented beta blocker date and time   History of Anesthesia Complications Negative for: history of anesthetic complications  Airway Mallampati: II  TM Distance: >3 FB Neck ROM: Full    Dental  (+) Poor Dentition, Dental Advisory Given, Missing, Chipped, Implants   Pulmonary neg pulmonary ROS,    Pulmonary exam normal breath sounds clear to auscultation       Cardiovascular (-) CAD + Valvular Problems/Murmurs AS  Rhythm:Regular Rate:Normal + Systolic murmurs  Cath 0000000: no CAD, severe AS (MG 58, AVA 0.50)  Echo 01/10/21: EF 65-70%, g1dd, normal RV function, normal MV, mild AI, severe AS (MG 34, AVA 0.94)   Neuro/Psych Anxiety Depression S/p SCS    GI/Hepatic Neg liver ROS, hiatal hernia, GERD  ,  Endo/Other  negative endocrine ROS  Renal/GU negative Renal ROS  negative genitourinary   Musculoskeletal  (+) Fibromyalgia -, narcotic dependent  Abdominal   Peds  Hematology negative hematology ROS (+)   Anesthesia Other Findings   Reproductive/Obstetrics                          Anesthesia Physical Anesthesia Plan  ASA: 4  Anesthesia Plan: General   Post-op Pain Management:    Induction: Intravenous  PONV Risk Score and Plan: 3 and Ondansetron, Dexamethasone, Treatment may vary due to age or medical condition and Midazolam  Airway Management Planned: Nasal ETT  Additional Equipment:   Intra-op Plan:   Post-operative Plan: Extubation in OR  Informed Consent: I have reviewed the patients History and Physical, chart, labs and discussed the procedure including the risks, benefits and alternatives for the proposed anesthesia with the patient or authorized representative who has indicated  his/her understanding and acceptance.     Dental advisory given  Plan Discussed with:   Anesthesia Plan Comments: ([place] for hemodynamic monitoring)       Anesthesia Quick Evaluation

## 2021-05-25 NOTE — Progress Notes (Signed)
DUE TO COVID-19 ONLY ONE VISITOR IS ALLOWED TO COME WITH YOU AND STAY IN THE WAITING ROOM ONLY DURING PRE OP AND PROCEDURE DAY OF SURGERY.   PCP - Dr Micheline Rough Cardiologist - Angelena Form, PA-C  CT Chest x-ray - 02/10/21 EKG - 01/25/21 Stress Test - n/a ECHO - 01/10/21 Cardiac Cath - 02/04/11  ICD Pacemaker/Loop - n/a  Sleep Study -  n/a CPAP - none  Anesthesia review: Yes  STOP now taking any Aspirin (unless otherwise instructed by your surgeon), Aleve, Naproxen, Ibuprofen, Motrin, Advil, Goody's, BC's, all herbal medications, fish oil, and all vitamins.   Coronavirus Screening Covid test n/a - Ambulatory Surgery Do you have any of the following symptoms:  Cough - Yes Fever (>100.4F)  yes/no: No Runny nose yes/no: No Sore throat yes/no: No Difficulty breathing/shortness of breath  yes/no: No  Have you traveled in the last 14 days and where? yes/no: No  Patient verbalized understanding of instructions that were via phone.

## 2021-05-26 ENCOUNTER — Other Ambulatory Visit: Payer: Self-pay

## 2021-05-26 ENCOUNTER — Encounter (HOSPITAL_COMMUNITY): Payer: Self-pay | Admitting: Dentistry

## 2021-05-26 ENCOUNTER — Ambulatory Visit (HOSPITAL_COMMUNITY): Payer: Medicare Other | Admitting: Vascular Surgery

## 2021-05-26 ENCOUNTER — Encounter (HOSPITAL_COMMUNITY)
Admission: RE | Disposition: A | Discharge status: Home / Self Care | Payer: Self-pay | Source: Home / Self Care | Attending: Dentistry

## 2021-05-26 ENCOUNTER — Ambulatory Visit (HOSPITAL_COMMUNITY)
Admission: RE | Admit: 2021-05-26 | Discharge: 2021-05-26 | Disposition: A | Discharge status: Home / Self Care | Payer: Medicare Other | Attending: Dentistry | Admitting: Dentistry

## 2021-05-26 DIAGNOSIS — K056 Periodontal disease, unspecified: Secondary | ICD-10-CM

## 2021-05-26 DIAGNOSIS — Z79899 Other long term (current) drug therapy: Secondary | ICD-10-CM | POA: Diagnosis not present

## 2021-05-26 DIAGNOSIS — G894 Chronic pain syndrome: Secondary | ICD-10-CM | POA: Insufficient documentation

## 2021-05-26 DIAGNOSIS — Z882 Allergy status to sulfonamides status: Secondary | ICD-10-CM | POA: Insufficient documentation

## 2021-05-26 DIAGNOSIS — K219 Gastro-esophageal reflux disease without esophagitis: Secondary | ICD-10-CM | POA: Insufficient documentation

## 2021-05-26 DIAGNOSIS — Z880 Allergy status to penicillin: Secondary | ICD-10-CM | POA: Diagnosis not present

## 2021-05-26 DIAGNOSIS — Z8249 Family history of ischemic heart disease and other diseases of the circulatory system: Secondary | ICD-10-CM | POA: Diagnosis not present

## 2021-05-26 DIAGNOSIS — I352 Nonrheumatic aortic (valve) stenosis with insufficiency: Secondary | ICD-10-CM | POA: Diagnosis not present

## 2021-05-26 DIAGNOSIS — E785 Hyperlipidemia, unspecified: Secondary | ICD-10-CM | POA: Insufficient documentation

## 2021-05-26 DIAGNOSIS — Z881 Allergy status to other antibiotic agents status: Secondary | ICD-10-CM | POA: Insufficient documentation

## 2021-05-26 DIAGNOSIS — I350 Nonrheumatic aortic (valve) stenosis: Secondary | ICD-10-CM | POA: Diagnosis not present

## 2021-05-26 DIAGNOSIS — K029 Dental caries, unspecified: Secondary | ICD-10-CM | POA: Diagnosis not present

## 2021-05-26 HISTORY — PX: MULTIPLE EXTRACTIONS WITH ALVEOLOPLASTY: SHX5342

## 2021-05-26 HISTORY — DX: Cerebral infarction, unspecified: I63.9

## 2021-05-26 HISTORY — DX: Other amnesia: R41.3

## 2021-05-26 HISTORY — DX: Nonrheumatic aortic (valve) stenosis: I35.0

## 2021-05-26 LAB — CBC
HCT: 41.7 % (ref 36.0–46.0)
Hemoglobin: 13.9 g/dL (ref 12.0–15.0)
MCH: 31.4 pg (ref 26.0–34.0)
MCHC: 33.3 g/dL (ref 30.0–36.0)
MCV: 94.3 fL (ref 80.0–100.0)
Platelets: 242 K/uL (ref 150–400)
RBC: 4.42 MIL/uL (ref 3.87–5.11)
RDW: 12.3 % (ref 11.5–15.5)
WBC: 6.5 K/uL (ref 4.0–10.5)
nRBC: 0 % (ref 0.0–0.2)

## 2021-05-26 LAB — COMPREHENSIVE METABOLIC PANEL
ALT: 18 U/L (ref 0–44)
AST: 21 U/L (ref 15–41)
Albumin: 3.6 g/dL (ref 3.5–5.0)
Alkaline Phosphatase: 71 U/L (ref 38–126)
Anion gap: 7 (ref 5–15)
BUN: 12 mg/dL (ref 8–23)
CO2: 25 mmol/L (ref 22–32)
Calcium: 8.6 mg/dL — ABNORMAL LOW (ref 8.9–10.3)
Chloride: 104 mmol/L (ref 98–111)
Creatinine, Ser: 1.02 mg/dL — ABNORMAL HIGH (ref 0.44–1.00)
GFR, Estimated: >60 mL/min (ref >60)
Glucose, Bld: 101 mg/dL — ABNORMAL HIGH (ref 70–99)
Potassium: 3.4 mmol/L — ABNORMAL LOW (ref 3.5–5.1)
Sodium: 136 mmol/L (ref 135–145)
Total Bilirubin: 0.6 mg/dL (ref 0.3–1.2)
Total Protein: 6.2 g/dL — ABNORMAL LOW (ref 6.5–8.1)

## 2021-05-26 SURGERY — MULTIPLE EXTRACTION WITH ALVEOLOPLASTY
Anesthesia: General | Site: Mouth

## 2021-05-26 MED ORDER — SUGAMMADEX SODIUM 200 MG/2ML IV SOLN
INTRAVENOUS | Status: DC | PRN
  Administered 2021-05-26: 200 mg via INTRAVENOUS

## 2021-05-26 MED ORDER — LIDOCAINE 2% (20 MG/ML) 5 ML SYRINGE
INTRAMUSCULAR | Status: DC | PRN
  Administered 2021-05-26: 100 mg via INTRAVENOUS

## 2021-05-26 MED ORDER — PROPOFOL 10 MG/ML IV BOLUS
INTRAVENOUS | Status: AC
  Filled 2021-05-26: qty 20

## 2021-05-26 MED ORDER — BUPIVACAINE-EPINEPHRINE (PF) 0.5% -1:200000 IJ SOLN
INTRAMUSCULAR | Status: AC
  Filled 2021-05-26: qty 3.6

## 2021-05-26 MED ORDER — AMISULPRIDE (ANTIEMETIC) 5 MG/2ML IV SOLN: 10.0000 mg | Freq: Once | INTRAVENOUS | Status: DC | PRN

## 2021-05-26 MED ORDER — ROCURONIUM BROMIDE 10 MG/ML (PF) SYRINGE
PREFILLED_SYRINGE | INTRAVENOUS | Status: DC | PRN
  Administered 2021-05-26: 60 mg via INTRAVENOUS

## 2021-05-26 MED ORDER — SODIUM CHLORIDE 0.9 % IV SOLN
INTRAVENOUS | Status: DC | PRN
  Administered 2021-05-26: 45 ug/min via INTRAVENOUS

## 2021-05-26 MED ORDER — LIDOCAINE-EPINEPHRINE 2 %-1:100000 IJ SOLN
INTRAMUSCULAR | Status: AC
  Filled 2021-05-26: qty 10.2

## 2021-05-26 MED ORDER — SODIUM CHLORIDE 0.9 % IV SOLN
2.0000 g | INTRAVENOUS | Status: AC
  Administered 2021-05-26: 2 g via INTRAVENOUS
  Filled 2021-05-26: qty 2

## 2021-05-26 MED ORDER — OXYMETAZOLINE HCL 0.05 % NA SOLN
NASAL | Status: AC
  Filled 2021-05-26: qty 30

## 2021-05-26 MED ORDER — OXYMETAZOLINE HCL 0.05 % NA SOLN
NASAL | Status: DC | PRN
  Administered 2021-05-26: 2 via NASAL

## 2021-05-26 MED ORDER — OXYCODONE HCL 5 MG PO TABS
5.0000 mg | ORAL_TABLET | Freq: Once | ORAL | Status: AC | PRN
  Administered 2021-05-26: 5 mg via ORAL

## 2021-05-26 MED ORDER — CHLORHEXIDINE GLUCONATE 0.12 % MT SOLN
15.0000 mL | Freq: Once | OROMUCOSAL | Status: AC
  Administered 2021-05-26: 15 mL via OROMUCOSAL
  Filled 2021-05-26: qty 15

## 2021-05-26 MED ORDER — ONDANSETRON HCL 4 MG/2ML IJ SOLN
4.0000 mg | Freq: Once | INTRAMUSCULAR | Status: AC | PRN
  Administered 2021-05-26: 4 mg via INTRAVENOUS

## 2021-05-26 MED ORDER — HYDROMORPHONE HCL 1 MG/ML IJ SOLN
INTRAMUSCULAR | Status: AC
  Filled 2021-05-26: qty 1

## 2021-05-26 MED ORDER — LIDOCAINE-EPINEPHRINE 2 %-1:100000 IJ SOLN
INTRAMUSCULAR | Status: DC | PRN
  Administered 2021-05-26: 5.1 mL

## 2021-05-26 MED ORDER — LACTATED RINGERS IV SOLN: INTRAVENOUS | Status: DC

## 2021-05-26 MED ORDER — DEXAMETHASONE SODIUM PHOSPHATE 10 MG/ML IJ SOLN
INTRAMUSCULAR | Status: AC
  Filled 2021-05-26: qty 1

## 2021-05-26 MED ORDER — FENTANYL CITRATE (PF) 250 MCG/5ML IJ SOLN
INTRAMUSCULAR | Status: DC | PRN
  Administered 2021-05-26: 150 ug via INTRAVENOUS

## 2021-05-26 MED ORDER — THROMBIN 5000 UNITS EX SOLR
CUTANEOUS | Status: AC
  Filled 2021-05-26: qty 5000

## 2021-05-26 MED ORDER — EPHEDRINE 5 MG/ML INJ
INTRAVENOUS | Status: AC
  Filled 2021-05-26: qty 5

## 2021-05-26 MED ORDER — DEXAMETHASONE SODIUM PHOSPHATE 10 MG/ML IJ SOLN
INTRAMUSCULAR | Status: DC | PRN
Start: 2021-05-26 — End: 2021-05-26
  Administered 2021-05-26: 10 mg via INTRAVENOUS

## 2021-05-26 MED ORDER — OXYMETAZOLINE HCL 0.05 % NA SOLN
NASAL | Status: DC | PRN
  Administered 2021-05-26: 1 via TOPICAL

## 2021-05-26 MED ORDER — OXYCODONE HCL 5 MG PO TABS
ORAL_TABLET | ORAL | Status: AC
  Filled 2021-05-26: qty 1

## 2021-05-26 MED ORDER — ONDANSETRON HCL 4 MG/2ML IJ SOLN
INTRAMUSCULAR | Status: AC
  Filled 2021-05-26: qty 2

## 2021-05-26 MED ORDER — MIDAZOLAM HCL 2 MG/2ML IJ SOLN
INTRAMUSCULAR | Status: DC | PRN
  Administered 2021-05-26: 2 mg via INTRAVENOUS

## 2021-05-26 MED ORDER — FENTANYL CITRATE (PF) 250 MCG/5ML IJ SOLN
INTRAMUSCULAR | Status: AC
  Filled 2021-05-26: qty 5

## 2021-05-26 MED ORDER — LIDOCAINE 2% (20 MG/ML) 5 ML SYRINGE
INTRAMUSCULAR | Status: AC
  Filled 2021-05-26: qty 5

## 2021-05-26 MED ORDER — MIDAZOLAM HCL 2 MG/2ML IJ SOLN
INTRAMUSCULAR | Status: AC
  Filled 2021-05-26: qty 2

## 2021-05-26 MED ORDER — 0.9 % SODIUM CHLORIDE (POUR BTL) OPTIME
TOPICAL | Status: DC | PRN
  Administered 2021-05-26: 1000 mL

## 2021-05-26 MED ORDER — ORAL CARE MOUTH RINSE: 15.0000 mL | Freq: Once | OROMUCOSAL | Status: AC

## 2021-05-26 MED ORDER — OXYCODONE HCL 5 MG/5ML PO SOLN: 5.0000 mg | Freq: Once | ORAL | Status: AC | PRN

## 2021-05-26 MED ORDER — ROCURONIUM BROMIDE 10 MG/ML (PF) SYRINGE
PREFILLED_SYRINGE | INTRAVENOUS | Status: AC
  Filled 2021-05-26: qty 10

## 2021-05-26 MED ORDER — PROPOFOL 10 MG/ML IV BOLUS
INTRAVENOUS | Status: DC | PRN
  Administered 2021-05-26: 50 mg via INTRAVENOUS

## 2021-05-26 MED ORDER — PHENYLEPHRINE HCL (PRESSORS) 10 MG/ML IV SOLN
INTRAVENOUS | Status: DC | PRN
  Administered 2021-05-26: 40 ug via INTRAVENOUS

## 2021-05-26 MED ORDER — ONDANSETRON HCL 4 MG/2ML IJ SOLN
INTRAMUSCULAR | Status: DC | PRN
  Administered 2021-05-26: 4 mg via INTRAVENOUS

## 2021-05-26 MED ORDER — LACTATED RINGERS IV SOLN: INTRAVENOUS | Status: DC | PRN

## 2021-05-26 MED ORDER — HYDROMORPHONE HCL 1 MG/ML IJ SOLN
0.2500 mg | INTRAMUSCULAR | Status: DC | PRN
  Administered 2021-05-26: 0.25 mg via INTRAVENOUS

## 2021-05-26 MED ORDER — HEMOSTATIC AGENTS (NO CHARGE) OPTIME
TOPICAL | Status: DC | PRN
  Administered 2021-05-26: 1 via TOPICAL

## 2021-05-26 SURGICAL SUPPLY — 34 items
ALCOHOL 70% 16 OZ (MISCELLANEOUS) ×2 IMPLANT
BAG COUNTER SPONGE SURGICOUNT (BAG) ×2 IMPLANT
BLADE SURG 15 STRL LF DISP TIS (BLADE) ×1 IMPLANT
BLADE SURG 15 STRL SS (BLADE) ×2
COVER SURGICAL LIGHT HANDLE (MISCELLANEOUS) ×2 IMPLANT
GAUZE 4X4 16PLY ~~LOC~~+RFID DBL (SPONGE) ×2 IMPLANT
GAUZE PACKING FOLDED 2  STR (GAUZE/BANDAGES/DRESSINGS) ×2
GAUZE PACKING FOLDED 2 STR (GAUZE/BANDAGES/DRESSINGS) ×1 IMPLANT
GLOVE SURG ENC MOIS LTX SZ6.5 (GLOVE) ×2 IMPLANT
GLOVE SURG POLYISO LF SZ6 (GLOVE) ×2 IMPLANT
GOWN STRL REUS W/ TWL LRG LVL3 (GOWN DISPOSABLE) ×2 IMPLANT
GOWN STRL REUS W/TWL LRG LVL3 (GOWN DISPOSABLE) ×4
KIT BASIN OR (CUSTOM PROCEDURE TRAY) ×2 IMPLANT
KIT TURNOVER KIT B (KITS) ×2 IMPLANT
MANIFOLD NEPTUNE II (INSTRUMENTS) ×2 IMPLANT
NEEDLE BLUNT 16X1.5 OR ONLY (NEEDLE) ×2 IMPLANT
NEEDLE DENTAL 27 LONG (NEEDLE) ×4 IMPLANT
NS IRRIG 1000ML POUR BTL (IV SOLUTION) ×2 IMPLANT
PACK EENT II TURBAN DRAPE (CUSTOM PROCEDURE TRAY) ×2 IMPLANT
PAD ARMBOARD 7.5X6 YLW CONV (MISCELLANEOUS) ×2 IMPLANT
SPONGE SURGIFOAM ABS GEL 100 (HEMOSTASIS) IMPLANT
SPONGE SURGIFOAM ABS GEL 12-7 (HEMOSTASIS) IMPLANT
SPONGE SURGIFOAM ABS GEL SZ50 (HEMOSTASIS) ×2 IMPLANT
SUCTION FRAZIER HANDLE 10FR (MISCELLANEOUS) ×2
SUCTION TUBE FRAZIER 10FR DISP (MISCELLANEOUS) ×1 IMPLANT
SUT CHROMIC 3 0 PS 2 (SUTURE) ×2 IMPLANT
SUT CHROMIC 4 0 P 3 18 (SUTURE) IMPLANT
SYR 50ML SLIP (SYRINGE) ×2 IMPLANT
SYR BULB IRRIG 60ML STRL (SYRINGE) ×2 IMPLANT
TOWEL GREEN STERILE FF (TOWEL DISPOSABLE) ×2 IMPLANT
TUBE CONNECTING 12X1/4 (SUCTIONS) ×2 IMPLANT
WATER STERILE IRR 1000ML POUR (IV SOLUTION) ×2 IMPLANT
WATER TABLETS ICX (MISCELLANEOUS) ×2 IMPLANT
YANKAUER SUCT BULB TIP NO VENT (SUCTIONS) ×2 IMPLANT

## 2021-05-26 NOTE — Progress Notes (Signed)
Per Dr. Benson Norway, ok to give Cefotan with patient's listed allergies.

## 2021-05-26 NOTE — Discharge Instructions (Signed)
[facility] Ar[facility] Gillen B. Mattox Schorr, D.M.D. Phone: [phone] Fax: [phone]    MOUTH CARE AFTER SURGERY   FACTS: Ice used in ice bag helps keep the swelling down, and can help lessen the pain. It is easier to treat pain BEFORE it happens. Spitting disturbs the clot and may cause bleeding to start again, or to get worse. Smoking delays healing and can cause complications. Sharing prescriptions can be dangerous.  Do not take medications not recently prescribed for you. Antibiotics may stop birth control pills from working.  Use other means of birth control while on antibiotics. Warm salt water rinses after the first 24 hours will help lessen the swelling:  Use 1/2 teaspoonful of table salt per oz.of water.  DO NOT: Do not spit.   Do not drink through a straw. Strongly advised not to smoke, dip snuff or chew tobacco at least for 3 days. Do not eat sharp or crunchy foods.  Avoid the area of surgery when chewing. Do not stop your antibiotics before your instructions say to do so. Do not eat hot foods until bleeding has stopped.  If you need to, let your food cool down to room temperature.  EXPECT: Some swelling, especially first 2-3 days. Soreness or discomfort in varying degrees.  Follow your dentist's instructions about how to handle pain before it starts. Pinkish saliva or light blood in saliva, or on your pillow in the morning.  This can last around 24 hours. Bruising inside or outside the mouth.  This may not show up until 2-3 days after surgery.  Don't worry, it will go away in time. Pieces of "bone" may work themselves loose.  It's OK.  If they bother you, let us know.    WHAT TO DO IMMEDIATELY AFTER SURGERY: Bite on gauze with steady pressure for 30-45 minutes at a time.  Switch out the gauze after 30-45 minutes for clean gauze, and continue this for 1-2 hours or until bleeding subsides. Do not chew on the gauze. Do not lie down flat.  Raise  your head support especially for the first 24 hours. Apply ice to your face on the side of the surgery.  You may apply it 20 minutes on and a few minutes off.  Ice for 8-12 hours.  You may use ice up to 24 hours. Before the numbness wears off, take a pain pill as instructed. Prescription pain medication is not always required.  SWELLING: Expect swelling for the first couple of days.  It should get better after that. If swelling increases 3 days or so after surgery, let us know as soon as possible.  FEVER: Take Tylenol every 4 hours if needed to lower your temperature, especially if it is at 100F or higher. Drink lots of fluids. If the fever does not go away, let us know.  BREATHING TROUBLE: Any unusual difficulty breathing means you have to have someone bring you to the emergency room ASAP.  BLEEDING: Light oozing is expected for 24 hours or so. Prop head up with pillows. Do not spit. Do not confuse bright red fresh flowing blood with lots of saliva colored with a little bit of blood. If you notice some bleeding, place gauze or a tea bag where it is bleeding and apply CONSTANT pressure by biting down for 1 hour.  Avoid talking during this time.  Do not remove the gauze or tea bag during this hour to "check" the bleeding. If you notice bright RED bleeding FLOWING out   of particular area, and filling the floor of your mouth, put a wad of gauze on that area, bite down firmly and constantly.  Call us immediately.  If we're closed, have someone bring you to the emergency room.  ORAL HYGIENE: Brush your teeth as usual after meals and before bedtime. Use a soft toothbrush around the area of surgery. DO NOT AVOID BRUSHING.  Otherwise bacteria(germs) will grow and may delay healing or encourage infection. Since you cannot spit, just gently rinse and let the water flow out of your mouth. DO NOT SWISH HARD.  EATING: Cool liquids are a good point to start.  Increase to soft foods as  tolerated.   PRESCRIPTIONS: Follow the directions for your prescriptions exactly as written. If your doctor gave you a narcotic pain medication, do not drive, operate machinery or drink alcohol when on that medication.   QUESTIONS? Call our office during office hours [phone] or call the Emergency Room at [phone].  

## 2021-05-26 NOTE — Anesthesia Procedure Notes (Signed)
Procedure Name: Intubation Date/Time: 05/26/2021 7:47 AM Performed by: Annamary Carolin, CRNA Pre-anesthesia Checklist: Patient identified, Emergency Drugs available, Suction available and Patient being monitored Patient Re-evaluated:Patient Re-evaluated prior to induction Oxygen Delivery Method: Circle System Utilized Preoxygenation: Pre-oxygenation with 100% oxygen Induction Type: IV induction Ventilation: Mask ventilation without difficulty Laryngoscope Size: Glidescope and 3 Grade View: Grade I Nasal Tubes: Left Tube size: 7.0 mm Number of attempts: 1 Placement Confirmation: ETT inserted through vocal cords under direct vision, positive ETCO2 and breath sounds checked- equal and bilateral Tube secured with: Tape Dental Injury: Teeth and Oropharynx as per pre-operative assessment  Comments: With ease by SRNA x1

## 2021-05-26 NOTE — Op Note (Signed)
Department of Dental Medicine    OPERATIVE REPORT  DATE OF SURGERY:   05/26/2021  PATIENT'S NAME:   [name] [name] DATE OF BIRTH:   [birth year] MEDICAL RECORD NUMBER: [id number]  SURGEON:   Raimundo Corbit B. Benson Norway, D.M.D.  ASSISTANT:  Molli Posey, DAII  PREOPERATIVE DIAGNOSES:  Dental caries, periodontal disease  Patient Active Problem List   Diagnosis Date Noted   Severe aortic stenosis    Hyperlipidemia 03/19/2020   S/P ORIF (open reduction internal fixation) fracture 07/12/17  07/19/2017   Insomnia 02/21/2012   DEPRESSION/ANXIETY 06/17/2010   Attention deficit disorder 02/02/2009   Osteopenia 10/23/2007   CHRONIC PAIN SYNDROME 09/11/2007   IRRITABLE BOWEL SYNDROME 09/11/2007   Osteoarthritis 09/11/2007   POSTOPERATIVE DIAGNOSES:  Dental caries, periodontal disease  PROCEDURES PERFORMED: Extractions of teeth numbers 2, 10, 13, 14, 18 and 32 1 quadrant of alveoloplasty (ULQ)  ANESTHESIA:  General anesthesia via nasal endotracheal tube.  MEDICATIONS: Cefotetan 2 g IV prior to invasive dental procedures. Local anesthesia with a total utilization of 3 cartridges of 34 mg of lidocaine with 0.018 mg of epinephrine/ea.  SPECIMENS:  6 teeth that were extracted and discarded  DRAINS/CULTURES:  None  COMPLICATIONS:  None  ESTIMATED BLOOD LOSS:  5 mL  INTRAVENOUS FLUIDS:  10 mL of Lactated ringers solution  INDICATIONS:  The patient was recently diagnosed with severe aortic stenosis.  A medically necessary dental consult was then requested to evaluate the patient for any dental/orofacial infection and their overall oral health.  The patient was examined and subsequently treatment planned for multiple extractions of severely decayed and infected teeth.  This treatment plan was made to decrease the perioperative and postoperative risks and complications associated with dental/orofacial infection from affecting the patient's systemic health.  OPERATIVE FINDINGS:  The patient  was examined in operating room number 8.  The indicated teeth were identified and verified for extraction. The patient was noted be affected by severe dental decay and periodontal disease.  DESCRIPTION OF PROCEDURE:  The patient was identified in the holding area and brought to the main operating room number 8 by the anesthesia team. The patient was then placed in the supine position on the operating table.  General anesthesia was then induced per the anesthesia team. The patient was then prepped and draped in the usual sterile fashion for dental medicine procedures.  A timeout was performed. The patient was identified and procedures were verified. A throat pack was placed at this time. The oral cavity was then thoroughly examined with the findings noted above. The patient was then ready for the dental medicine procedure as follows:   ANESTHESIA: Local anesthesia was administered sequentially with a total utilization of 3 cartridges each containing 34 mg of lidocaine with 0.018 mg of epinephrine/ea.  Location of anesthesia included #2, #10, #13/#14 infiltration and palatal, #18 and #32 infiltration and lingual.  ROUTINE EXTRACTIONS: The maxillary left and right quadrants were first approached. The teeth were then subluxated with a series of straight elevators.  Teeth numbers 2, 10, 13 and 14 were then removed with a 150 forceps without complications.  Alveoloplasty was then performed utilizing a ronguers and bone file.  The tissues were approximated and trimmed appropriately to help achieve primary closure.  The surgical sites were then irrigated with copious amounts of sterile saline.  Surgi Foam was placed in extraction sites #13 and #14.  The surgical sites were closed using 3-0 chromic gut sutures as follows: 2 figure-8 style sutures.  The mandibular  left and right quadrants were then approached. The teeth were subluxated with a series of straight elevators.  Teeth numbers 18 and 32 were then removed  utilizing a #23 cowhorn and 151 forceps without complications.  Alveoloplasty was then performed utilizing a rongeurs and bone file. The tissues were approximated and trimmed appropriately to help achieve primary closure. The surgical sites were then irrigated with copious amounts of sterile saline.   END OF PROCEDURE: Thorough oral irrigation with sterile saline was performed.  Good hemostasis was observed.  The patient was examined for complications, and seeing none, the dental medicine procedure was deemed to be complete.  The throat pack was removed at this time. An oral airway was then placed at the request of the anesthesia team.  A series of 4x4 gauze were placed in the mouth to aid hemostasis as needed.  The patient was then handed over to the anesthesia team for final disposition.  After an appropriate amount of time, the patient was extubated and taken to the postanesthsia care unit in stable condition.  All counts were correct for the dental medicine procedure.     [place] Benson Norway, D.M.D.

## 2021-05-26 NOTE — Transfer of Care (Signed)
Immediate Anesthesia Transfer of Care Note  Patient: [name]  Procedure(s) Performed: MULTIPLE EXTRACTION WITH ALVEOLOPLASTY (Mouth)  Patient Location: PACU  Anesthesia Type:General  Level of Consciousness: awake, alert  and oriented  Airway & Oxygen Therapy: Patient Spontanous Breathing and Patient connected to face mask oxygen  Post-op Assessment: Report given to RN, Post -op Vital signs reviewed and stable and Patient moving all extremities X 4  Post vital signs: Reviewed and stable  Last Vitals:  Vitals Value Taken Time  BP 163/93 05/26/21 0909  Temp 36.1 C 05/26/21 0854  Pulse 77 05/26/21 0921  Resp 20 05/26/21 0921  SpO2 96 % 05/26/21 0921  Vitals shown include unvalidated device data.  Last Pain:  Vitals:   05/26/21 0646  TempSrc:   PainSc: 0-No pain      Patients Stated Pain Goal: 2 ([id number])  Complications: No notable events documented.

## 2021-05-26 NOTE — Interval H&P Note (Signed)
History and Physical Interval Note:  05/26/2021 7:18 AM  [name] [name]  has presented today for surgery, with the diagnosis of DENTAL CARIES.  The various methods of treatment have been discussed with the patient and family. After consideration of risks, benefits and other options for treatment, the patient has consented to  Procedure(s): MULTIPLE EXTRACTION WITH ALVEOLOPLASTY (N/A) as a surgical intervention.  The patient's history has been reviewed, patient examined, no change in status, stable for surgery.  I have reviewed the patient's chart and labs.  Questions were answered to the patient's satisfaction.     Charlaine Dalton

## 2021-05-26 NOTE — Anesthesia Postprocedure Evaluation (Signed)
Anesthesia Post Note  Patient: [name] [name]  Procedure(s) Performed: MULTIPLE EXTRACTION WITH ALVEOLOPLASTY (Mouth)     Patient location during evaluation: PACU Anesthesia Type: General Level of consciousness: awake and alert Pain management: pain level controlled Vital Signs Assessment: post-procedure vital signs reviewed and stable Respiratory status: spontaneous breathing, nonlabored ventilation and respiratory function stable Cardiovascular status: blood pressure returned to baseline and stable Postop Assessment: no apparent nausea or vomiting Anesthetic complications: no   No notable events documented.  Last Vitals:  Vitals:   05/26/21 0909 05/26/21 0924  BP: (!) 163/93 (!) 166/72  Pulse:  78  Resp: 20 17  Temp:    SpO2:  94%    Last Pain:  Vitals:   05/26/21 0924  TempSrc:   PainSc: [place]

## 2021-05-27 ENCOUNTER — Telehealth: Payer: Self-pay | Admitting: Dentistry

## 2021-05-27 ENCOUNTER — Encounter (HOSPITAL_COMMUNITY): Payer: Self-pay | Admitting: Dentistry

## 2021-05-27 NOTE — Telephone Encounter (Signed)
06/03/21  @   1333  I contacted the patient to see how she was doing after her dental surgery yesterday.  She states she is doing well considering with her pain well-managed and no bleeding.  She said that she used ice packs all yesterday as instructed and this helped.  Postoperative appointment scheduled with the patient for Friday 9/9 @ 1200.  Instructed patient to call the dental clinic should she have any questions or concerns prior to then.  She verbalized understanding and was appreciative of the phone call.  [place] Benson Norway, D.M.D.

## 2021-06-03 ENCOUNTER — Other Ambulatory Visit: Payer: Self-pay

## 2021-06-03 ENCOUNTER — Ambulatory Visit (INDEPENDENT_AMBULATORY_CARE_PROVIDER_SITE_OTHER): Payer: Medicare Other | Admitting: Dentistry

## 2021-06-03 ENCOUNTER — Encounter (HOSPITAL_COMMUNITY): Payer: Self-pay | Admitting: Dentistry

## 2021-06-03 DIAGNOSIS — K08199 Complete loss of teeth due to other specified cause, unspecified class: Secondary | ICD-10-CM

## 2021-06-03 NOTE — Progress Notes (Signed)
Department of Dental Medicine      POSTOPERATIVE VISIT  Service Date:   06/03/2021  Patient Name:   [name] [name] Date of Birth:   Feb 16, 1956 Medical Record Number: [id number]        Today's visit:   Assessment:   The patient continues to heal well and consistent with dental procedures performed. The patient is optimized from a dental standpoint at this time for cardiac surgery.  Plan:  Follow-up as needed. Return for restorative on teeth numbers 8 and 9 and then establish care at an outside dental office for routine treatment including replacement of missing teeth as needed, cleanings/periodontal therapy and exams.       PROGRESS NOTE:   COVID-19 SCREENING:  The patient denies symptoms concerning for COVID-19 infection including fever, chills, cough, or newly developed shortness of breath.   HISTORY OF PRESENT ILLNESS [name] [name] presents today for a postoperative visit s/p multiple extractions in the operating room on 05/26/21. Medical and dental history reviewed with the patient.  No changes reported.   CHIEF COMPLAINT:   Patient with no complaints.  She states that she has been doing well since her dental surgery.   Patient Active Problem List   Diagnosis Date Noted   [place]    Chronic periodontal disease    Severe aortic stenosis    Hyperlipidemia 03/19/2020   S/P ORIF (open reduction internal fixation) fracture 07/12/17  07/19/2017   Insomnia 02/21/2012   DEPRESSION/ANXIETY 06/17/2010   Attention deficit disorder 02/02/2009   Osteopenia 10/23/2007   CHRONIC PAIN SYNDROME 09/11/2007   IRRITABLE BOWEL SYNDROME 09/11/2007   Osteoarthritis 09/11/2007   Past Medical History:  Diagnosis Date   ABDOMINAL PAIN, LOWER 02/16/2009   Anxiety    Aortic stenosis    APPENDECTOMY, HX OF 09/11/2007   ATTENTION DEFICIT DISORDER, ADULT 02/02/2009   BACK PAIN 07/16/2007   chronic   Back pain    spinal stimulator in place   CERVICAL RADICULOPATHY, RIGHT 07/16/2007    CHEST PAIN, ACUTE 09/03/2008   Chronic abdominal pain    Chronic pain syndrome [id number]   Complication of anesthesia    woke up during surgeries few times and woke up during endoscopy and colonscopy few weeks ago, in terrible pain   DDD (degenerative disc disease), lumbosacral    DEPRESSION/ANXIETY 06/17/2010   Dysuria 02/20/2008   Elevated liver enzymes 1 and  1/2 months ago   Family history of anesthesia complication    mother woke up during surgeries   Fibromyalgia    Functional GI symptoms    FX, RAMUS NOS, CLOSED 07/16/2007   GERD (gastroesophageal reflux disease)    no current problems   H/O failed conscious sedation    PT STATES SHE IS HARD TO SEDATE!   HEMORRHOIDS 09/11/2007   HIATAL HERNIA, HX OF 09/11/2007   History of kidney stones    passed stones   HX, PERSONAL, MUSCULOSKELETAL DISORD NEC 07/16/2007   HX, PERSONAL, URINARY CALCULI 07/16/2007   Hyperlipidemia    on crestor   Irritable bowel syndrome 09/11/2007   Memory loss    NEOPLASM, SKIN, UNCERTAIN BEHAVIOR [id number]   OSTEOARTHRITIS 09/11/2007   oa   OSTEOPENIA 10/23/2007   PONV (postoperative nausea and vomiting)    ROTATOR CUFF REPAIR, HX OF 09/11/2007   Stroke ([place])    mini stroke "years ago" per patient   Suicidal ideations    SYMPTOM, PAIN, ABDOMINAL, RIGHT UP QUADRANT 07/16/2007   TAH/BSO, HX OF 09/11/2007   UTI  10/06/2010   VAGINITIS, ATROPHIC, POSTMENOPAUSAL 07/16/2007   Past Surgical History:  Procedure Laterality Date   ABDOMINAL HYSTERECTOMY  1976   removed due to abnormal paps; no uterine cancer   [place]   bone pushed back into  place after fracture Left 25-30 yrs ago   face   cervical radiculopathy  20 yrs ago   RT   CHOLECYSTECTOMY N/A 02/18/2014   Procedure: LAPAROSCOPIC CHOLECYSTECTOMY;  Surgeon: Harl Bowie, MD;  Location: [facility];  Service: General;  Laterality: N/A;   COLONOSCOPY     DILATION AND CURETTAGE OF UTERUS  age 24    ESOPHAGOGASTRODUODENOSCOPY     MULTIPLE EXTRACTIONS WITH ALVEOLOPLASTY N/A 05/26/2021   Procedure: MULTIPLE EXTRACTION WITH ALVEOLOPLASTY;  Surgeon: Charlaine Dalton, DMD;  Location: [facility];  Service: Dentistry;  Laterality: N/A;   ORIF ANKLE FRACTURE Left 07/12/2017   Procedure: OPEN REDUCTION INTERNAL FIXATION (ORIF) LEFT ANKLE FRACTURE;  Surgeon: Carole Civil, MD;  Location: [facility];  Service: Orthopedics;  Laterality: Left;   RIGHT/LEFT HEART CATH AND CORONARY ANGIOGRAPHY N/A 02/03/2021   Procedure: RIGHT/LEFT HEART CATH AND CORONARY ANGIOGRAPHY;  Surgeon: Burnell Blanks, MD;  Location: [facility];  Service: Cardiovascular;  Laterality: N/A;   [place]   right   SPINAL CORD STIMULATOR IMPLANT  2005   lower back, pt turned off 2-3 weeks ago due to causing pain   TUBAL LIGATION     Current Outpatient Medications  Medication Sig Dispense Refill   ALPRAZolam (XANAX) 1 MG tablet Take 1 mg by mouth at bedtime as needed for anxiety or sleep.     amphetamine-dextroamphetamine (ADDERALL) 30 MG tablet Take 30 mg by mouth daily as needed (focus/attention).     cyclobenzaprine (FLEXERIL) 10 MG tablet Take 10 mg by mouth 3 (three) times daily as needed for muscle spasms.     diclofenac Sodium (VOLTAREN) 1 % GEL Apply 2 g topically 3 (three) times daily as needed (pain).     docusate sodium (COLACE) 100 MG capsule Take 100 mg by mouth daily as needed for moderate constipation.     gabapentin (NEURONTIN) 300 MG capsule Take 300 mg by mouth 4 (four) times daily.     guaiFENesin (MUCINEX) 600 MG 12 hr tablet Take 600 mg by mouth 2 (two) times daily as needed for to loosen phlegm or cough.     Heating Pad PADS Use 3 times weekly 12 each 0   hydrOXYzine (ATARAX/VISTARIL) 25 MG tablet Take 25 mg by mouth 2 (two) times daily as needed for itching.     ibuprofen (ADVIL) 200 MG tablet Take 400-800 mg by mouth every 6 (six) hours as needed for moderate pain (pain).      metoprolol tartrate (LOPRESSOR) 100 MG tablet Take as directed prior to 5/19 CT scan 1 tablet 0   ondansetron (ZOFRAN-ODT) 4 MG disintegrating tablet DISSOLVE 1 TABLET BY MOUTH EVERY 8 HOURS AS NEEDED FOR NAUSEA (Patient taking differently: Take 4 mg by mouth every 8 (eight) hours as needed for nausea or vomiting.) 90 tablet 0   Oxycodone HCl 10 MG TABS Take 10 mg by mouth 4 (four) times daily as needed (pain).     polyethylene glycol (MIRALAX / GLYCOLAX) 17 g packet Take 17 g by mouth daily as needed for moderate constipation.     promethazine (PHENERGAN) 25 MG suppository INSERT 1 SUPPOSITORY RECTALLY EVERY 8 HOURS AS NEEDED. (Patient taking  differently: Place 25 mg rectally every 8 (eight) hours as needed for nausea or vomiting.) 10 suppository 0   rosuvastatin (CRESTOR) 20 MG tablet TAKE (1) TABLET BY MOUTH ONCE DAILY. (Patient taking differently: Take 20 mg by mouth daily.) 90 tablet 0   VIIBRYD 40 MG TABS TAKE (1) TABLET BY MOUTH ONCE DAILY WITH FULL MEAL. (Patient taking differently: Take 40 mg by mouth at bedtime.) 90 tablet 0   No current facility-administered medications for this visit.   Allergies  Allergen Reactions   Ciprofloxacin Itching and Palpitations    migraine   Macrobid [Nitrofurantoin Macrocrystal] Other (See Comments)    Body aches and Migraine headache   Ampicillin Other (See Comments)    Told by md not to take ampilciian due to penicillin allergy   Atorvastatin Other (See Comments)    Felt like had flu   Buspirone Hcl Other (See Comments)    REACTION: intolerance   Ezetimibe-Simvastatin Other (See Comments)    Unknown   Latex Other (See Comments)    Redness and burning   Lyrica [Pregabalin] Other (See Comments)    Caused swelling in legs and hands   Penicillins Other (See Comments)    Fever and itching   Sulfa Antibiotics Nausea Only    LABS: Lab Results  Component Value Date   WBC 6.5 05/26/2021   HGB 13.9 05/26/2021   HCT 41.7 05/26/2021   MCV 94.3  05/26/2021   PLT 242 05/26/2021   BMET    Component Value Date/Time   NA 136 05/26/2021 0547   NA 139 01/25/2021 1140   K 3.4 (L) 05/26/2021 0547   CL 104 05/26/2021 0547   CO2 25 05/26/2021 0547   GLUCOSE 101 (H) 05/26/2021 0547   BUN 12 05/26/2021 0547   BUN 9 01/25/2021 1140   CREATININE 1.02 (H) 05/26/2021 0547   CREATININE 1.12 (H) 07/07/2020 1430   CALCIUM 8.6 (L) 05/26/2021 0547   GFRNONAA >60 05/26/2021 0547   GFRAA >60 06/21/2017 1021    No results found for: INR, PROTIME No results found for: PTT   VITALS: BP (!) 168/94 (BP Location: Right Arm, Patient Position: Sitting, Cuff Size: Normal)   Pulse (!) 101   Temp 98.3 F (36.8 C) (Oral)    EXAM: Extraction sites appear to be healing WNL.  No signs of wound dehiscence or infection evident upon examination.  No sutures remain in-tact.   ASSESSMENT:   Postoperative course is consistent with dental procedures performed.   PROCEDURES: The patient was given a chlorhexidine gluconate rinse for 30 seconds.  Extraction sites were irrigated with sterile saline.   PLAN AND RECOMMENDATIONS: Return for restorative treatment on teeth #8 and #9, and then establish care at an outside dental office for routine dental care including replacement of missing teeth as needed, cleanings/periodontal therapy and exams.   Recommend that the patient discuss plans to return to the dentist for non-urgent treatment with their medical team to ensure they are medically optimized and there are no contraindications. Call if any questions or concerns arise.  All questions and concerns were invited and addressed.  The patient tolerated today's visit well and departed in stable condition.  [place] Benson [place], D.M.D.

## 2021-06-08 ENCOUNTER — Telehealth: Payer: Self-pay

## 2021-06-08 NOTE — Telephone Encounter (Signed)
  [facility]   MULTIDISCIPLINARY HEART VALVE TEAM  I contacted the pt to follow-up on plans for scheduling TAVR 06/14/21.  The pt states that she contacted her PCP yesterday in regards to having a possible UTI and overall not feeling well.  Per pt they advised her to proceed to the closest Urgent Care for evaluation.  At this time the pt has not been evaluated but plans to possibly go today. The pt would like to hold off on scheduling TAVR at this time and I advised that she needs to seek evaluation for possible infection and treatment. The pt also said that her daughter is in the process of  moving and she would like to allow her time to get settled so that she can stay with her after surgery.  At this time I have scheduled the pt to see Dr Angelena Form on 9/22 for continued cardiology follow-up and to finalize plans for scheduling TAVR in October.  Pt agreed with plan.

## 2021-06-09 ENCOUNTER — Ambulatory Visit
Admission: EM | Admit: 2021-06-09 | Discharge: 2021-06-09 | Disposition: A | Discharge status: Home / Self Care | Payer: Medicare Other | Attending: Emergency Medicine | Admitting: Emergency Medicine

## 2021-06-09 ENCOUNTER — Encounter: Payer: Self-pay | Admitting: Emergency Medicine

## 2021-06-09 ENCOUNTER — Other Ambulatory Visit: Payer: Self-pay

## 2021-06-09 DIAGNOSIS — M545 Low back pain, unspecified: Secondary | ICD-10-CM | POA: Insufficient documentation

## 2021-06-09 DIAGNOSIS — Z760 Encounter for issue of repeat prescription: Secondary | ICD-10-CM | POA: Diagnosis not present

## 2021-06-09 DIAGNOSIS — R109 Unspecified abdominal pain: Secondary | ICD-10-CM

## 2021-06-09 DIAGNOSIS — G8929 Other chronic pain: Secondary | ICD-10-CM | POA: Diagnosis not present

## 2021-06-09 DIAGNOSIS — Z23 Encounter for immunization: Secondary | ICD-10-CM

## 2021-06-09 LAB — POCT URINALYSIS DIP (MANUAL ENTRY)
Blood, UA: NEGATIVE
Glucose, UA: NEGATIVE mg/dL
Ketones, POC UA: NEGATIVE mg/dL
Leukocytes, UA: NEGATIVE
Nitrite, UA: NEGATIVE
Spec Grav, UA: >=1.03 — AB (ref 1.010–1.025)
Urobilinogen, UA: 0.2 E.U./dL
pH, UA: 5.5 (ref 5.0–8.0)

## 2021-06-09 MED ORDER — PREDNISONE 20 MG PO TABS: 20.0000 mg | ORAL_TABLET | Freq: Two times a day (BID) | ORAL | 0 refills | Status: AC

## 2021-06-09 MED ORDER — ACYCLOVIR 5 % EX OINT: 1.0000 "application " | TOPICAL_OINTMENT | CUTANEOUS | 0 refills | Status: AC | PRN

## 2021-06-09 MED ORDER — TETANUS-DIPHTH-ACELL PERTUSSIS 5-2.5-18.5 LF-MCG/0.5 IM SUSY
0.5000 mL | PREFILLED_SYRINGE | Freq: Once | INTRAMUSCULAR | Status: AC
  Administered 2021-06-09: 0.5 mL via INTRAMUSCULAR
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
## 2021-06-09 NOTE — ED Triage Notes (Signed)
Pt thinks she poss has a uti, reports back pain

## 2021-06-09 NOTE — Discharge Instructions (Signed)
Urine without infection Continue conservative management of rest, ice, and gentle stretches Prednisone prescribed Take cyclobenzaprine at nighttime for symptomatic relief. Avoid driving or operating heavy machinery while using medication. Follow up with pain management Return or go to the ER if you have any new or worsening symptoms (fever, chills, chest pain, abdominal pain, changes in bowel or bladder habits, pain radiating into lower legs, etc...)   Tetanus updated  Acyclovir ointment refilled

## 2021-06-11 LAB — URINE CULTURE
Culture: <10000 — AB
Special Requests: NORMAL

## 2021-06-16 ENCOUNTER — Ambulatory Visit (INDEPENDENT_AMBULATORY_CARE_PROVIDER_SITE_OTHER): Payer: Medicare Other | Admitting: Cardiovascular Disease

## 2021-06-16 ENCOUNTER — Other Ambulatory Visit: Payer: Self-pay

## 2021-06-16 ENCOUNTER — Encounter: Payer: Self-pay | Admitting: Cardiovascular Disease

## 2021-06-16 VITALS — BP 136/70 | HR 95 | Ht 68.0 in | Wt 153.0 lb

## 2021-06-16 DIAGNOSIS — I350 Nonrheumatic aortic (valve) stenosis: Secondary | ICD-10-CM

## 2021-06-16 NOTE — Patient Instructions (Signed)
Medication Instructions:  No changes *If you need a refill on your cardiac medications before your next appointment, please call your pharmacy*   Lab Work: None ordered   Testing/Procedures: See letter attached.   Follow-Up: As planned

## 2021-06-16 NOTE — Progress Notes (Addendum)
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 contrast.   FINDINGS: Image quality: Excellent.   Noise artifact is: Limited.   Valve Morphology: Tricuspid aortic valve with severely thickened leaflets. The leaflets are thickened and retracted with incomplete coaptation consistent with at least moderate aortic regurgitation. There is moderate diffuse calcifications present. The LCC is severely restricted in systole.   Aortic Valve area: 1.20-1.30 cm2   Aortic Valve Calcium score: 530   Aortic annular dimension:   Phase assessed: 10%   Annular area: 326 mm2   Annular perimeter: 65.0  mm   Max diameter: 22.1 mm   Min diameter: 18.9 mm   Annular and subannular calcification: None.    Optimal coplanar projection: LAO 15 CAU 4   Coronary Artery Height above Annulus:   Left Main: 11.5 mm   Right Coronary: 16.3 mm   Sinus of Valsalva Measurements:   Non-coronary: 25 mm   Right-coronary: 25 mm   Left-coronary: 25 mm   Sinus of Valsalva Height:   Non-coronary: 17.2 mm   Right-coronary: 19.8 mm   Left-coronary: 16.6 mm   Sinotubular Junction: 24 mm   Ascending Thoracic Aorta: 28 mm   Coronary Arteries: Normal coronary origin. Right dominance. The study was performed without use of NTG and is insufficient for plaque evaluation. Please refer to recent cardiac catheterization for coronary assessment.   Cardiac Morphology:   Right Atrium: Right atrial size is within normal limits.   Right Ventricle: The right ventricular cavity is within normal limits.   Left Atrium: Left atrial size is normal in size with no left atrial appendage filling defect.   Left Ventricle: The ventricular cavity size is within normal limits. There are no stigmata of prior infarction. There is no abnormal filling defect. Left ventricular function is hyperdynamic, LVEF=84%. No regional wall motion abnormalities.   Pulmonary arteries: Normal in size without proximal filling defect.   Pulmonary veins: Normal pulmonary venous drainage.   Pericardium: Normal thickness with no significant effusion or calcium present.   Mitral Valve: The mitral valve is normal structure without significant calcification.   Extra-cardiac findings: See attached radiology report for non-cardiac structures.   IMPRESSION: 1. Tricuspid aortic valve with severely thickened leaflets and moderate calcifications.   2. Annular measurements are small (326 mm2). Would consider 26 mm Evolut Pro.   3. No significant annular or subannular calcifications.   4. Sufficient coronary to annulus distance.   4. Optimal Fluoroscopic Angle for Delivery: LAO 15 CAU 4  Recent Labs: 07/07/2020: TSH  2.45 05/26/2021: ALT 18; BUN 12; Creatinine, Ser 1.02; Hemoglobin 13.9; Platelets 242; Potassium 3.4; Sodium 136    Wt Readings from Last 3 Encounters:  06/16/21 153 lb (69.4 kg)  05/26/21 156 lb (70.8 kg)  05/12/21 156 lb (70.8 kg)     Other studies Reviewed: Additional studies/ records that were reviewed today include: echo images, office notes, EKG Review of the above records demonstrates: severe AS   STS Risk Score: Procedure: AVR + CAB Risk of Mortality: 1.420% Renal Failure: 1.120% Permanent Stroke: 1.494% Prolonged Ventilation: 4.976% DSW Infection: 0.077% Reoperation: 2.486% Morbidity or Mortality: 8.243% Short Length of Stay: 50.776% Long Length of Stay: 3.401%  Assessment and Plan:   1. Severe Aortic Valve Stenosis: She has severe stage D severe aortic valve stenosis. She is NYHA class III. I have personally reviewed the echo images. The aortic valve is thickened, calcified with limited leaflet mobility. I think she would benefit from AVR. We will plan to proceed with TAVR. Her workup is complete. Dental extractions are complete.   I have reviewed the natural history of aortic stenosis with the patient and their family members  who are present today. We have discussed the limitations of medical therapy and the poor prognosis associated with symptomatic aortic stenosis. We have reviewed potential treatment options, including palliative medical therapy, conventional surgical aortic valve replacement, and transcatheter aortic valve replacement. We discussed treatment options in the context of the patient's specific comorbid medical conditions.   Will plan TAVR on October 4,2022 with placement of a 26  mm Medtronic Evolut Pro THV from the transfemoral approach.   Current medicines are reviewed at length with the patient today.  The patient does not have concerns regarding medicines.  The following changes have been made:  no change  Labs/ tests ordered today include:    No orders of the defined types were placed in this encounter.    Disposition:   F/U with the valve team after TAVR   Signed, Lauree Chandler, MD 06/16/2021 3:15 PM    [facility] [address], [place], [place]  [address] Phone: [phone]; Fax: [phone]

## 2021-06-23 NOTE — Progress Notes (Signed)
Surgical Instructions    Your procedure is scheduled on Tuesday, October 4th, 2022.   Report to [facility] Main Entrance "A" at 05:30 A.M., then check in with the Admitting office.  Call this number if you have problems the morning of surgery:  [phone]   If you have any questions prior to your surgery date call [phone]: Open Monday-Friday 8am-4pm    Remember:  Do not eat or drink after midnight the night before your surgery    Take these medicines the morning of surgery with A SIP OF WATER - NONE   STOP now taking any Aspirin (unless otherwise instructed by your surgeon), Aleve, Naproxen, Ibuprofen, Motrin, Advil, Goody's, BC's, all herbal medications, fish oil, and all vitamins.   Continue taking all other medications without change through the day before surgery.  On the morning of surgery do not take any medications.   Do not wear jewelry or makeup Do not wear lotions, powders, perfumes, or deodorant. Do not shave 48 hours prior to surgery.   Do not bring valuables to the hospital. DO Not wear nail polish, gel polish, artificial nails, or any other type of covering on natural nails including finger and toenails. If patients have artificial nails, gel coating, etc. that need to be removed by a nail salon please have this removed prior to surgery or surgery may need to be canceled/delayed if the surgeon/ anesthesia feels like the patient is unable to be adequately monitored.              [facility] is not responsible for any belongings or valuables.  Do NOT Smoke (Tobacco/Vaping)  24 hours prior to your procedure If you use a CPAP at night, you may bring your mask for your overnight stay.   Contacts, glasses, dentures or bridgework may not be worn into surgery, please bring cases for these belongings   For patients admitted to the hospital, discharge time will be determined by your treatment team.   Patients discharged the day of surgery will not be allowed to  drive home, and someone needs to stay with them for 24 hours.  NO VISITORS WILL BE ALLOWED IN PRE-OP WHERE PATIENTS GET READY FOR SURGERY.  ONLY 1 SUPPORT PERSON MAY BE PRESENT IN THE WAITING ROOM WHILE YOU ARE IN SURGERY.  IF YOU ARE TO BE ADMITTED, ONCE YOU ARE IN YOUR ROOM YOU WILL BE ALLOWED TWO (2) VISITORS.  Minor children may have two parents present. Special consideration for safety and communication needs will be reviewed on a case by case basis.  Special instructions:    Oral Hygiene is also important to reduce your risk of infection.  Remember - BRUSH YOUR TEETH THE MORNING OF SURGERY WITH YOUR REGULAR TOOTHPASTE   [facility]- Preparing For Surgery  Before surgery, you can play an important role. Because skin is not sterile, your skin needs to be as free of germs as possible. You can reduce the number of germs on your skin by washing with CHG (chlorahexidine gluconate) Soap before surgery.  CHG is an antiseptic cleaner which kills germs and bonds with the skin to continue killing germs even after washing.     Please do not use if you have an allergy to CHG or antibacterial soaps. If your skin becomes reddened/irritated stop using the CHG.  Do not shave (including legs and underarms) for at least 48 hours prior to first CHG shower. It is OK to shave your face.  Please follow these instructions carefully.  Shower the NIGHT BEFORE SURGERY and the MORNING OF SURGERY with CHG Soap.   If you chose to wash your hair, wash your hair first as usual with your normal shampoo. After you shampoo, rinse your hair and body thoroughly to remove the shampoo.  Then [employer] and genitals (private parts) with your normal soap and rinse thoroughly to remove soap.  After that Use CHG Soap as you would any other liquid soap. You can apply CHG directly to the skin and wash gently with a scrungie or a clean washcloth.   Apply the CHG Soap to your body ONLY FROM THE NECK DOWN.  Do not use on open  wounds or open sores. Avoid contact with your eyes, ears, mouth and genitals (private parts). Wash Face and genitals (private parts)  with your normal soap.   Wash thoroughly, paying special attention to the area where your surgery will be performed.  Thoroughly rinse your body with warm water from the neck down.  DO NOT shower/wash with your normal soap after using and rinsing off the CHG Soap.  Pat yourself dry with a CLEAN TOWEL.  Wear CLEAN PAJAMAS to bed the night before surgery  Place CLEAN SHEETS on your bed the night before your surgery  DO NOT SLEEP WITH PETS.   Day of Surgery:  Take a shower with CHG soap. Wear Clean/Comfortable clothing the morning of surgery Do not apply any deodorants/lotions.   Remember to brush your teeth WITH YOUR REGULAR TOOTHPASTE.   Please read over the following fact sheets that you were given.

## 2021-06-24 ENCOUNTER — Ambulatory Visit (HOSPITAL_COMMUNITY)
Admission: RE | Admit: 2021-06-24 | Discharge: 2021-06-24 | Disposition: A | Discharge status: Home / Self Care | Payer: Medicare Other | Source: Ambulatory Visit | Attending: Cardiovascular Disease | Admitting: Cardiovascular Disease

## 2021-06-24 ENCOUNTER — Encounter (HOSPITAL_COMMUNITY): Payer: Self-pay

## 2021-06-24 ENCOUNTER — Encounter (HOSPITAL_COMMUNITY)
Admission: RE | Admit: 2021-06-24 | Discharge: 2021-06-24 | Disposition: A | Discharge status: Home / Self Care | Payer: Medicare Other | Source: Ambulatory Visit | Attending: Cardiovascular Disease | Admitting: Cardiovascular Disease

## 2021-06-24 ENCOUNTER — Ambulatory Visit: Payer: Medicare Other | Attending: Cardiovascular Disease | Admitting: Physical Therapy

## 2021-06-24 ENCOUNTER — Other Ambulatory Visit: Payer: Self-pay

## 2021-06-24 ENCOUNTER — Encounter: Payer: Self-pay | Admitting: Physical Therapy

## 2021-06-24 DIAGNOSIS — Z01818 Encounter for other preprocedural examination: Secondary | ICD-10-CM | POA: Insufficient documentation

## 2021-06-24 DIAGNOSIS — Z20822 Contact with and (suspected) exposure to covid-19: Secondary | ICD-10-CM | POA: Insufficient documentation

## 2021-06-24 DIAGNOSIS — K589 Irritable bowel syndrome without diarrhea: Secondary | ICD-10-CM | POA: Insufficient documentation

## 2021-06-24 DIAGNOSIS — M797 Fibromyalgia: Secondary | ICD-10-CM | POA: Insufficient documentation

## 2021-06-24 DIAGNOSIS — K219 Gastro-esophageal reflux disease without esophagitis: Secondary | ICD-10-CM | POA: Insufficient documentation

## 2021-06-24 DIAGNOSIS — E785 Hyperlipidemia, unspecified: Secondary | ICD-10-CM | POA: Insufficient documentation

## 2021-06-24 DIAGNOSIS — Z79891 Long term (current) use of opiate analgesic: Secondary | ICD-10-CM | POA: Diagnosis not present

## 2021-06-24 DIAGNOSIS — Z79899 Other long term (current) drug therapy: Secondary | ICD-10-CM | POA: Diagnosis not present

## 2021-06-24 DIAGNOSIS — F32A Depression, unspecified: Secondary | ICD-10-CM | POA: Insufficient documentation

## 2021-06-24 DIAGNOSIS — F411 Generalized anxiety disorder: Secondary | ICD-10-CM | POA: Diagnosis not present

## 2021-06-24 DIAGNOSIS — I350 Nonrheumatic aortic (valve) stenosis: Secondary | ICD-10-CM

## 2021-06-24 DIAGNOSIS — G894 Chronic pain syndrome: Secondary | ICD-10-CM | POA: Diagnosis not present

## 2021-06-24 DIAGNOSIS — Z9682 Presence of neurostimulator: Secondary | ICD-10-CM | POA: Insufficient documentation

## 2021-06-24 DIAGNOSIS — Z791 Long term (current) use of non-steroidal anti-inflammatories (NSAID): Secondary | ICD-10-CM | POA: Insufficient documentation

## 2021-06-24 DIAGNOSIS — R2689 Other abnormalities of gait and mobility: Secondary | ICD-10-CM | POA: Insufficient documentation

## 2021-06-24 DIAGNOSIS — M5416 Radiculopathy, lumbar region: Secondary | ICD-10-CM | POA: Diagnosis not present

## 2021-06-24 HISTORY — DX: Pneumonia, unspecified organism: J18.9

## 2021-06-24 LAB — URINALYSIS, ROUTINE W REFLEX MICROSCOPIC
Bilirubin Urine: NEGATIVE
Glucose, UA: NEGATIVE mg/dL
Hgb urine dipstick: NEGATIVE
Ketones, ur: NEGATIVE mg/dL
Leukocytes,Ua: NEGATIVE
Nitrite: NEGATIVE
Protein, ur: NEGATIVE mg/dL
Specific Gravity, Urine: 1.017 (ref 1.005–1.030)
pH: 5 (ref 5.0–8.0)

## 2021-06-24 LAB — BLOOD GAS, ARTERIAL
Acid-Base Excess: 0.9 mmol/L (ref 0.0–2.0)
Bicarbonate: 25.4 mmol/L (ref 20.0–28.0)
FIO2: 21
O2 Saturation: 98.1 %
Patient temperature: 37
pCO2 arterial: 43.8 mmHg (ref 32.0–48.0)
pH, Arterial: 7.381 (ref 7.350–7.450)
pO2, Arterial: 103 mmHg (ref 83.0–108.0)

## 2021-06-24 LAB — COMPREHENSIVE METABOLIC PANEL
ALT: 16 U/L (ref 0–44)
AST: 17 U/L (ref 15–41)
Albumin: 3.6 g/dL (ref 3.5–5.0)
Alkaline Phosphatase: 83 U/L (ref 38–126)
Anion gap: 9 (ref 5–15)
BUN: 9 mg/dL (ref 8–23)
CO2: 22 mmol/L (ref 22–32)
Calcium: 8.8 mg/dL — ABNORMAL LOW (ref 8.9–10.3)
Chloride: 105 mmol/L (ref 98–111)
Creatinine, Ser: 0.91 mg/dL (ref 0.44–1.00)
GFR, Estimated: >60 mL/min (ref >60)
Glucose, Bld: 110 mg/dL — ABNORMAL HIGH (ref 70–99)
Potassium: 3.6 mmol/L (ref 3.5–5.1)
Sodium: 136 mmol/L (ref 135–145)
Total Bilirubin: 0.7 mg/dL (ref 0.3–1.2)
Total Protein: 6.1 g/dL — ABNORMAL LOW (ref 6.5–8.1)

## 2021-06-24 LAB — PROTIME-INR
INR: 1 (ref 0.8–1.2)
Prothrombin Time: 13.3 seconds (ref 11.4–15.2)

## 2021-06-24 LAB — CBC
HCT: 40.7 % (ref 36.0–46.0)
Hemoglobin: 13.9 g/dL (ref 12.0–15.0)
MCH: 32 pg (ref 26.0–34.0)
MCHC: 34.2 g/dL (ref 30.0–36.0)
MCV: 93.6 fL (ref 80.0–100.0)
Platelets: 238 10*3/uL (ref 150–400)
RBC: 4.35 MIL/uL (ref 3.87–5.11)
RDW: 12.9 % (ref 11.5–15.5)
WBC: 6.1 10*3/uL (ref 4.0–10.5)
nRBC: 0 % (ref 0.0–0.2)

## 2021-06-24 LAB — SARS CORONAVIRUS 2 (TAT 6-24 HRS): SARS Coronavirus 2: NEGATIVE

## 2021-06-24 LAB — TYPE AND SCREEN
ABO/RH(D): O POS
Antibody Screen: NEGATIVE

## 2021-06-24 LAB — SURGICAL PCR SCREEN
MRSA, PCR: NEGATIVE
Staphylococcus aureus: NEGATIVE

## 2021-06-24 NOTE — Therapy (Signed)
[place], [place], [address] Phone: [phone]   Fax:  [phone]  Physical Therapy Pre-TAVR Evaluation  Patient Details  Name: [name] [name] MRN: [id number] Date of Birth: 03/05/1956 Referring Provider (PT): Burnell Blanks, MD   Encounter Date: 06/24/2021   PT End of Session - 06/24/21 1053     Visit Number 1    Number of Visits 1    Date for PT Re-Evaluation 06/24/21    Authorization Type UHC MCR    PT Start Time 0017    PT Stop Time 1115    PT Time Calculation (min) 30 min    Equipment Utilized During Treatment Gait belt    Activity Tolerance Patient limited by fatigue    Behavior During Therapy WFL for tasks assessed/performed             Past Medical History:  Diagnosis Date   ABDOMINAL PAIN, LOWER 02/16/2009   Anxiety    Aortic stenosis    APPENDECTOMY, HX OF 09/11/2007   ATTENTION DEFICIT DISORDER, ADULT 02/02/2009   BACK PAIN 07/16/2007   chronic   Back pain    spinal stimulator in place   CERVICAL RADICULOPATHY, RIGHT 07/16/2007   CHEST PAIN, ACUTE 09/03/2008   Chronic abdominal pain    Chronic pain syndrome 49/44/9675   Complication of anesthesia    woke up during surgeries few times and woke up during endoscopy and colonscopy few weeks ago, in terrible pain   DDD (degenerative disc disease), lumbosacral    DEPRESSION/ANXIETY 06/17/2010   Dysuria 02/20/2008   Elevated liver enzymes 1 and  1/2 months ago   Family history of anesthesia complication    mother woke up during surgeries   Fibromyalgia    Functional GI symptoms    FX, RAMUS NOS, CLOSED 07/16/2007   GERD (gastroesophageal reflux disease)    no current problems   H/O failed conscious sedation    PT STATES SHE IS HARD TO SEDATE!   HEMORRHOIDS 09/11/2007   HIATAL HERNIA, HX OF 09/11/2007   History of kidney stones    passed stones   HX, PERSONAL, MUSCULOSKELETAL DISORD NEC 07/16/2007   HX,  PERSONAL, URINARY CALCULI 07/16/2007   Hyperlipidemia    on crestor   Irritable bowel syndrome 09/11/2007   Memory loss    NEOPLASM, SKIN, UNCERTAIN BEHAVIOR 91/63/8466   OSTEOARTHRITIS 09/11/2007   oa   OSTEOPENIA 10/23/2007   Pneumonia    PONV (postoperative nausea and vomiting)    ROTATOR CUFF REPAIR, HX OF 09/11/2007   Stroke ([place])    mini stroke "years ago" per patient   Suicidal ideations    SYMPTOM, PAIN, ABDOMINAL, RIGHT UP QUADRANT 07/16/2007   TAH/BSO, HX OF 09/11/2007   UTI 10/06/2010   VAGINITIS, ATROPHIC, POSTMENOPAUSAL 07/16/2007    Past Surgical History:  Procedure Laterality Date   ABDOMINAL HYSTERECTOMY  1976   removed due to abnormal paps; no uterine cancer   [place]   bone pushed back into  place after fracture Left 25-30 yrs ago   face   CARDIAC CATHETERIZATION     cervical radiculopathy  20 yrs ago   RT   CHOLECYSTECTOMY N/A 02/18/2014   Procedure: LAPAROSCOPIC CHOLECYSTECTOMY;  Surgeon: Harl Bowie, MD;  Location: [facility];  Service: General;  Laterality: N/A;   COLONOSCOPY     DILATION AND CURETTAGE OF UTERUS  age 29   ESOPHAGOGASTRODUODENOSCOPY  MULTIPLE EXTRACTIONS WITH ALVEOLOPLASTY N/A 05/26/2021   Procedure: MULTIPLE EXTRACTION WITH ALVEOLOPLASTY;  Surgeon: Charlaine Dalton, DMD;  Location: [facility];  Service: Dentistry;  Laterality: N/A;   ORIF ANKLE FRACTURE Left 07/12/2017   Procedure: OPEN REDUCTION INTERNAL FIXATION (ORIF) LEFT ANKLE FRACTURE;  Surgeon: Carole Civil, MD;  Location: [facility];  Service: Orthopedics;  Laterality: Left;   RIGHT/LEFT HEART CATH AND CORONARY ANGIOGRAPHY N/A 02/03/2021   Procedure: RIGHT/LEFT HEART CATH AND CORONARY ANGIOGRAPHY;  Surgeon: Burnell Blanks, MD;  Location: [facility];  Service: Cardiovascular;  Laterality: N/A;   [place]   right   SPINAL CORD STIMULATOR IMPLANT  2005   lower back, pt turned off 2-3 weeks ago due  to causing pain   TUBAL LIGATION      There were no vitals filed for this visit.    Subjective Assessment - 06/24/21 1048     Subjective Patient reports she has been feeling some heart burn and pressure in her chest at night which is she able to alleviate by raising the bed. She does report that she feels tired all the time. She also reports that her memory has been worsening recently.    Pertinent History History of chronic back pain, depression/anxiety, fibromyalgia, GERD, hyperlipidemia and severe aortic stenosis    Limitations Walking;House hold activities    How long can you walk comfortably? "walk around at [employer] if it is a good day"    Patient Stated Goals Get heart better    Currently in Pain? Yes    Pain Score 6     Pain Location Generalized   mostly lower back and right foot   Pain Descriptors / Indicators Aching   "feel bad"   Pain Type Chronic pain    Pain Onset More than a month ago    Pain Frequency Constant    Aggravating Factors  General activity    Pain Relieving Factors Laying down, heating pad, medicine                [facility] PT Assessment - 06/24/21 0001       Assessment   Medical Diagnosis Severe Aortic Stenosis    Referring Provider (PT) Burnell Blanks, MD    Onset Date/Surgical Date 06/28/21    Hand Dominance Right    Prior Therapy No      Precautions   Precautions None      Restrictions   Weight Bearing Restrictions No      Balance Screen   Has the patient fallen in the past 6 months No    Has the patient had a decrease in activity level because of a fear of falling?  No    Is the patient reluctant to leave their home because of a fear of falling?  No      Home Environment   Living Environment Private residence    Living Arrangements Spouse/significant other    Type of [place] Access Level entry    Home Layout One level      Prior Function   Level of [place] On disability     Leisure None reported      Cognition   Overall Cognitive Status Within Functional Limits for tasks assessed      Observation/Other Assessments   Observations Patient appears in no apparent distress    Focus on Therapeutic Outcomes (FOTO)  NA  Sensation   Light Touch Appears Intact      Coordination   Gross Motor Movements are Fluid and Coordinated Yes      Posture/Postural Control   Posture Comments Rounded shoulder posture      ROM / Strength   AROM / PROM / Strength Strength;AROM      AROM   Overall AROM Comments BUE and BLE AROM grossly WFL      Strength   Overall Strength Comments BUE and BLE strength grossly 4/5 MMT    Strength Assessment Site Hand    Right/Left hand Right;Left    Right Hand Grip (lbs) 45, 40, 38    Left Hand Grip (lbs) 38, 38, 35      Transfers   Transfers Independent with all Transfers      Ambulation/Gait   Ambulation/Gait Yes    Ambulation/Gait Assistance 7: Independent    Gait Comments Antalgic on right, decreased gait speed              [facility] Pre-Surgical Assessment - 06/24/21 0001     5 Meter Walk Test- trial 1 6 sec    5 Meter Walk Test- trial 2 6 sec.     5 Meter Walk Test- trial 3 6 sec.    5 meter walk test average 6 sec    4 Stage Balance Test tolerated for:  1 sec.    4 Stage Balance Test Position 3    Sit To Stand Test- trial 1 15 sec.    ADL/IADL Independent with: Bathing;Dressing;Meal prep;Finances    ADL/IADL Needs Assistance with: Valla Leaver work    ADL/IADL Fraility Index Vulnerable    6 Minute Walk- Baseline yes    BP (mmHg) 122/69    HR (bpm) 80    02 Sat (%RA) 97 %    Modified Borg Scale for Dyspnea 0- Nothing at all    Perceived Rate of Exertion (Borg) 6-    6 Minute Walk Post Test yes    BP (mmHg) 135/72    HR (bpm) 84    02 Sat (%RA) 99 %    Modified Borg Scale for Dyspnea 2- Mild shortness of breath    Perceived Rate of Exertion (Borg) 15- Hard    Aerobic Endurance Distance Walked 810                       Objective measurements completed on examination: See above findings.                PT Education - 06/24/21 1053     Education Details Exam findings    Person(s) Educated Patient    Methods Explanation    Comprehension Verbalized understanding                         Plan - 06/24/21 1054     Clinical Impression Statement See below:    Personal Factors and Comorbidities Fitness;Past/Current Experience;Time since onset of injury/illness/exacerbation;Comorbidity 3+    Comorbidities History of chronic back pain, depression/anxiety, fibromyalgia, GERD, hyperlipidemia and severe aortic stenosis    Examination-Activity Limitations Locomotion Level;Stairs;Stand;Lift    Examination-Participation Restrictions Meal Prep;Cleaning;Occupation;Community Activity;Yard Work;Laundry;Shop;Driving    Stability/Clinical Decision Making Stable/Uncomplicated    Clinical Decision Making Low    Rehab Potential Good    PT Frequency One time visit    PT Treatment/Interventions ADLs/Self Care Home Management;Aquatic Therapy;Cryotherapy;Electrical Stimulation;Iontophoresis 4mg /ml Dexamethasone;Moist Heat;Traction;Ultrasound;Neuromuscular re-education;Balance training;Therapeutic exercise;Therapeutic activities;Functional mobility training;Stair training;Gait training;Patient/family  education;Manual techniques;Energy conservation;Dry needling;Passive range of motion;Taping;Vasopneumatic Device;Spinal Manipulations;Joint Manipulations    PT Next Visit Plan NA    PT Home Exercise Plan NA    Consulted and Agree with Plan of Care Patient            Clinical Impression Statement: Pt is a 65 yo female presenting to OP PT for evaluation prior to possible TAVR surgery due to severe aortic stenosis. Pt reports onset of fatigue approximately 6-12 months ago. Symptoms are limiting walking and household tasks. Pt presents with good ROM and strength, impaired balance and is at  fall risk 4 stage balance test, decreased walking speed and poor aerobic endurance per 6 minute walk test. Pt ambulated a total of 810 feet in 6 minute walk. RPE increased significantly with 6 minute walk test. Based on the Short Physical Performance Battery, patient has a frailty rating of 8/12 with </= 5/12 considered frail.    Patient demonstrates the following deficits and impairments:  Abnormal gait, Difficulty walking, Decreased activity tolerance, Postural dysfunction, Decreased strength, Decreased balance, Pain, Cardiopulmonary status limiting activity, Decreased endurance  Visit Diagnosis: Other abnormalities of gait and mobility     Problem List Patient Active Problem List   Diagnosis Date Noted   [place]    Chronic periodontal disease    Severe aortic stenosis    Hyperlipidemia 03/19/2020   S/P ORIF (open reduction internal fixation) fracture 07/12/17  07/19/2017   Insomnia 02/21/2012   DEPRESSION/ANXIETY 06/17/2010   Attention deficit disorder 02/02/2009   Osteopenia 10/23/2007   CHRONIC PAIN SYNDROME 09/11/2007   IRRITABLE BOWEL SYNDROME 09/11/2007   Osteoarthritis 09/11/2007    [name] [name], PT, DPT, LAT, ATC 06/24/21  11:29 AM Phone: [phone] Fax: [facility] [address] [place], [place], [address] Phone: [phone]   Fax:  [phone]  Name: [name] [name] MRN: [id number] Date of Birth: 12-20-1955

## 2021-06-24 NOTE — Progress Notes (Signed)
PCP - Micheline Rough, MD Cardiologist - Lyman Bishop, MD  PPM/ICD - denies Device Orders - N/A Rep Notified - N/A  Chest x-ray - 06/24/2021 EKG - 06/24/2021 Stress Test - denies ECHO - 01/20/2021 Cardiac Cath - 02/03/2021  Sleep Study - denies CPAP - n/a  Fasting Blood Sugar - n/a  Blood Thinner Instructions: n/a  MD instructions - STOP now taking any Aspirin (unless otherwise instructed by your surgeon), Aleve, Naproxen, Ibuprofen, Motrin, Advil, Goody's, BC's, all herbal medications, fish oil, and all vitamins.   Continue taking all other medications without change through the day before surgery.  On the morning of surgery do not take any medications.  ERAS Protcol - n/a  COVID TEST- done in PAT on 06/24/2021  Anesthesia review: yes, cardiac history  Patient denies shortness of breath, fever, cough and chest pain at PAT appointment   All instructions explained to the patient, with a verbal understanding of the material. Patient agrees to go over the instructions while at home for a better understanding. Patient also instructed to self quarantine after being tested for COVID-19. The opportunity to ask questions was provided.

## 2021-06-27 MED ORDER — NOREPINEPHRINE 4 MG/250ML-% IV SOLN
0.0000 ug/min | INTRAVENOUS | Status: AC
  Administered 2021-06-28: 1 ug/min via INTRAVENOUS
  Filled 2021-06-27: qty 250

## 2021-06-27 MED ORDER — VANCOMYCIN HCL IN DEXTROSE 1-5 GM/200ML-% IV SOLN: 1000.0000 mg | INTRAVENOUS | Status: DC

## 2021-06-27 MED ORDER — MAGNESIUM SULFATE 50 % IJ SOLN: 40.0000 meq | INTRAMUSCULAR | Status: DC

## 2021-06-27 MED ORDER — POTASSIUM CHLORIDE 2 MEQ/ML IV SOLN
80.0000 meq | INTRAVENOUS | Status: DC
  Filled 2021-06-27: qty 40

## 2021-06-27 MED ORDER — HEPARIN 30,000 UNITS/1000 ML (OHS) CELLSAVER SOLUTION
Status: DC
  Filled 2021-06-27: qty 1000

## 2021-06-27 MED ORDER — MAGNESIUM SULFATE 50 % IJ SOLN
40.0000 meq | INTRAMUSCULAR | Status: DC
  Filled 2021-06-27: qty 9.85

## 2021-06-27 MED ORDER — NOREPINEPHRINE 4 MG/250ML-% IV SOLN: 0.0000 ug/min | INTRAVENOUS | Status: DC

## 2021-06-27 MED ORDER — POTASSIUM CHLORIDE 2 MEQ/ML IV SOLN: 80.0000 meq | INTRAVENOUS | Status: DC

## 2021-06-27 MED ORDER — VANCOMYCIN HCL IN DEXTROSE 1-5 GM/200ML-% IV SOLN
1000.0000 mg | INTRAVENOUS | Status: AC
  Administered 2021-06-28: 1000 mg via INTRAVENOUS
  Filled 2021-06-27: qty 200

## 2021-06-27 MED ORDER — HEPARIN 30,000 UNITS/1000 ML (OHS) CELLSAVER SOLUTION: Status: DC

## 2021-06-27 MED ORDER — DEXMEDETOMIDINE HCL IN NACL 400 MCG/100ML IV SOLN: 0.1000 ug/kg/h | INTRAVENOUS | Status: DC

## 2021-06-27 MED ORDER — DEXMEDETOMIDINE HCL IN NACL 400 MCG/100ML IV SOLN
0.1000 ug/kg/h | INTRAVENOUS | Status: AC
  Administered 2021-06-28: 1 ug/kg/h via INTRAVENOUS
  Filled 2021-06-27: qty 100

## 2021-06-27 NOTE — Anesthesia Preprocedure Evaluation (Addendum)
Anesthesia Evaluation  Patient identified by MRN, date of birth, ID band Patient awake    Reviewed: Allergy & Precautions, NPO status , Patient's Chart, lab work & pertinent test results  History of Anesthesia Complications (+) PONV  Airway Mallampati: II  TM Distance: >3 FB Neck ROM: Full    Dental  (+) Dental Advisory Given   Pulmonary neg pulmonary ROS,    breath sounds clear to auscultation       Cardiovascular hypertension, Pt. on medications and Pt. on home beta blockers + Valvular Problems/Murmurs AS  Rhythm:Regular Rate:Normal     Neuro/Psych  Neuromuscular disease CVA    GI/Hepatic Neg liver ROS, GERD  ,  Endo/Other  negative endocrine ROS  Renal/GU negative Renal ROS     Musculoskeletal  (+) Arthritis , Fibromyalgia -  Abdominal   Peds  Hematology negative hematology ROS (+)   Anesthesia Other Findings   Reproductive/Obstetrics                             Lab Results  Component Value Date   WBC 6.1 06/24/2021   HGB 13.9 06/24/2021   HCT 40.7 06/24/2021   MCV 93.6 06/24/2021   PLT 238 06/24/2021   Lab Results  Component Value Date   CREATININE 0.91 06/24/2021   BUN 9 06/24/2021   NA 136 06/24/2021   K 3.6 06/24/2021   CL 105 06/24/2021   CO2 22 06/24/2021    Anesthesia Physical Anesthesia Plan  ASA: 4  Anesthesia Plan: MAC   Post-op Pain Management:    Induction:   PONV Risk Score and Plan: 3 and Ondansetron, Treatment may vary due to age or medical condition, Propofol infusion and Midazolam  Airway Management Planned: Natural Airway and Simple Face Mask  Additional Equipment: Arterial line  Intra-op Plan:   Post-operative Plan:   Informed Consent: I have reviewed the patients History and Physical, chart, labs and discussed the procedure including the risks, benefits and alternatives for the proposed anesthesia with the patient or authorized  representative who has indicated his/her understanding and acceptance.       Plan Discussed with: CRNA  Anesthesia Plan Comments: (. )      Anesthesia Quick Evaluation

## 2021-06-27 NOTE — H&P (Signed)
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                              Systemic VTI:  0.26 m                              Systemic Diam: 1.80 cm   Loralie Champagne MD  Electronically signed by Loralie Champagne MD  Signature Date/Time: 01/10/2021/4:55:49 PM         RIGHT/LEFT HEART CATH AND CORONARY ANGIOGRAPHY      Conclusion   1. No angiographic evidence of CAD 2. Severe aortic stenosis with mean gradient 58 mmHg, peak to peak gradient 59 mmHg, AVA 0.50 cm2   Will continue workup for TAVR   Indications   Severe aortic stenosis [I35.0 (ICD-10-CM)]    Procedural Details   Technical Details Indication: 65 yo female with severe aortic stenosis.  Workup for TAVR  Procedure: The risks, benefits, complications, treatment options, and expected outcomes were discussed with the patient. The patient and/or family concurred with the proposed plan, giving informed consent. The patient was brought to the cath lab after IV hydration was given. The patient was sedated with Versed and Fentanyl. The IV catheter in the right antecubital vein was changed for a 5 [place] sheath. Right heart catheterization performed with a balloon tipped catheter. The right wrist was prepped and draped in a sterile fashion. 1% lidocaine was used for local anesthesia. Using the modified Seldinger access technique, a 5 French sheath was placed in the right radial artery. 3 mg Verapamil was given through the sheath. 4000 units IV heparin was given. Standard diagnostic catheters were used to perform selective coronary angiography. The aortic valve was crossed with the JR4 catheter and a J wire. LV pressures measured. No LV gram. The sheath was removed from the right radial artery and a Terumo hemostasis band was applied at the arteriotomy site on the right wrist.    Estimated blood loss <50 mL.   During this procedure medications were administered to achieve and maintain moderate conscious sedation while the patient's heart rate, blood pressure, and oxygen saturation were continuously monitored and I was present face-to-face 100% of this time.    Medications (Filter: Administrations occurring from [phone] to 0933 on 02/03/21)  (important)  Continuous medications are totaled by the amount administered until 02/03/21 0933.      Heparin (Porcine) in NaCl 1000-0.9 UT/500ML-% SOLN (mL) Total volume:  1,000 mL   Date/Time Rate/Dose/Volume Action    02/03/21 0841 500 mL Given    0841 500 mL Given      midazolam (VERSED) injection (mg) Total dose:  3 mg   Date/Time Rate/Dose/Volume Action    02/03/21 0844 1 mg Given    0904 1 mg Given    0909 1 mg Given      fentaNYL  (SUBLIMAZE) injection (mcg) Total dose:  75 mcg   Date/Time Rate/Dose/Volume Action    02/03/21 0844 25 mcg Given    0904 25 mcg Given    0909 25 mcg Given      lidocaine (PF) (XYLOCAINE) 1 % injection (mL) Total volume:  4 mL   Date/Time Rate/Dose/Volume Action    02/03/21 0904 2 mL Given    0906 2 mL Given      Radial Cocktail/Verapamil only (mL) Total volume:  10 mL   Date/Time Rate/Dose/Volume Action    02/03/21 0910 10 mL  Given      heparin sodium (porcine) injection (Units) Total dose:  4,000 Units   Date/Time Rate/Dose/Volume Action    02/03/21 0916 4,000 Units Given      iohexol (OMNIPAQUE) 350 MG/ML injection (mL) Total volume:  35 mL   Date/Time Rate/Dose/Volume Action    02/03/21 0929 35 mL Given      Sedation Time   Sedation Time Physician-1: 39 minutes 42 seconds     Contrast   Medication Name Total Dose  iohexol (OMNIPAQUE) 350 MG/ML injection 35 mL      Radiation/Fluoro   Fluoro time: 3.5 (min) DAP: 7496 (mGycm2) Cumulative Air Kerma: 409 (mGy)     Complications     Complications documented before study signed (02/03/2021  9:33 AM)        RIGHT/LEFT HEART CATH AND CORONARY ANGIOGRAPHY   None Documented by Burnell Blanks, MD 02/03/2021  9:31 AM  Date Found: 02/03/2021  Time Range: Intraprocedure         Coronary Findings     Diagnostic Dominance: Right   Left Anterior Descending  Vessel is large.  Ramus Intermedius  Vessel is moderate in size.  Left Circumflex  Vessel is large.  Right Coronary Artery  Vessel is large.    Intervention     No interventions have been documented.   Coronary Diagrams     Diagnostic Dominance: Right      Intervention       Implants      No implant documentation for this case.      [employer]ng[employer] Images    Show images for CARDIAC CATHETERIZATION   Images on Long Term Storage    Show images for [name]   Link to Procedure Log   Procedure Log      Hemo Data    Flowsheet Row Most Recent Value  Fick Cardiac Output 4.2 L/min  Fick Cardiac Output Index 2.38 (L/min)/BSA  Aortic Mean Gradient 58.1 mmHg  Aortic Peak Gradient 59 mmHg  Aortic Valve Area 0.50  Aortic Value Area Index 0.28 cm2/BSA  RA A Wave 16 mmHg  RA V Wave 12 mmHg  RA Mean 11 mmHg  RV Systolic Pressure 29 mmHg  RV Diastolic Pressure 9 mmHg  RV EDP 13 mmHg  PA Systolic Pressure 32 mmHg  PA Diastolic Pressure 14 mmHg  PA Mean 21 mmHg  PW A Wave 14 mmHg  PW V Wave 12 mmHg  PW Mean 10 mmHg  AO Systolic Pressure 811 mmHg  AO Diastolic Pressure 72 mmHg  AO Mean 95 mmHg  LV Systolic Pressure 914 mmHg  LV Diastolic Pressure 14 mmHg  LV EDP 22 mmHg  AOp Systolic Pressure 782 mmHg  AOp Diastolic Pressure 79 mmHg  AOp Mean Pressure 956 mmHg  LVp Systolic Pressure 213 mmHg  LVp Diastolic Pressure 12 mmHg  LVp EDP Pressure 16 mmHg  QP/QS 1  TPVR Index 8.81 HRUI  TSVR Index 39.86 HRUI  PVR SVR Ratio 0.13  TPVR/TSVR Ratio 0.22        EKG:   NSR w/out significant AV conduction delay (01/25/21)       Cardiac TAVR CT   TECHNIQUE: A non-contrast, gated CT scan was obtained with axial slices of 3 mm through the heart for aortic valve calcium scoring. A 90 kV retrospective, gated, contrast cardiac scan was obtained. Gantry rotation speed was 250 msecs and collimation was 0.6 mm. Nitroglycerin was not given. The 3D data set was reconstructed in 5% intervals  of the 0-95% of the R-R cycle. Systolic and diastolic phases were analyzed on a dedicated workstation using MPR, MIP, and VRT modes. The patient received 100 cc of contrast.   FINDINGS: Image quality: Excellent.   Noise artifact is: Limited.   Valve Morphology: Tricuspid aortic valve with severely thickened leaflets. The leaflets are thickened and retracted with incomplete coaptation consistent with at least moderate aortic regurgitation. There is moderate diffuse calcifications present. The LCC is severely restricted  in systole.   Aortic Valve area: 1.20-1.30 cm2   Aortic Valve Calcium score: 530   Aortic annular dimension:   Phase assessed: 10%   Annular area: 326 mm2   Annular perimeter: 65.0 mm   Max diameter: 22.1 mm   Min diameter: 18.9 mm   Annular and subannular calcification: None.   Optimal coplanar projection: LAO 15 CAU 4   Coronary Artery Height above Annulus:   Left Main: 11.5 mm   Right Coronary: 16.3 mm   Sinus of Valsalva Measurements:   Non-coronary: 25 mm   Right-coronary: 25 mm   Left-coronary: 25 mm   Sinus of Valsalva Height:   Non-coronary: 17.2 mm   Right-coronary: 19.8 mm   Left-coronary: 16.6 mm   Sinotubular Junction: 24 mm   Ascending Thoracic Aorta: 28 mm   Coronary Arteries: Normal coronary origin. Right dominance. The study was performed without use of NTG and is insufficient for plaque evaluation. Please refer to recent cardiac catheterization for coronary assessment.   Cardiac Morphology:   Right Atrium: Right atrial size is within normal limits.   Right Ventricle: The right ventricular cavity is within normal limits.   Left Atrium: Left atrial size is normal in size with no left atrial appendage filling defect.   Left Ventricle: The ventricular cavity size is within normal limits. There are no stigmata of prior infarction. There is no abnormal filling defect. Left ventricular function is hyperdynamic, LVEF=84%. No regional wall motion abnormalities.   Pulmonary arteries: Normal in size without proximal filling defect.   Pulmonary veins: Normal pulmonary venous drainage.   Pericardium: Normal thickness with no significant effusion or calcium present.   Mitral Valve: The mitral valve is normal structure without significant calcification.   Extra-cardiac findings: See attached radiology report for non-cardiac structures.   IMPRESSION: 1. Tricuspid aortic valve with severely thickened leaflets and moderate  calcifications.   2. Annular measurements are small (326 mm2). Would consider 26 mm Evolut Pro.   3. No significant annular or subannular calcifications.   4. Sufficient coronary to annulus distance.   4. Optimal Fluoroscopic Angle for Delivery: LAO 15 CAU 4   Dilley T. Audie Box, MD     Electronically Signed   By: Eleonore Chiquito   On: 02/13/2021 19:38     CT ANGIOGRAPHY CHEST, ABDOMEN AND PELVIS   TECHNIQUE: Multidetector CT imaging through the chest, abdomen and pelvis was performed using the standard protocol during bolus administration of intravenous contrast. Multiplanar reconstructed images and MIPs were obtained and reviewed to evaluate the vascular anatomy.   CONTRAST:  186mL OMNIPAQUE IOHEXOL 350 MG/ML SOLN   COMPARISON:  CT the abdomen and pelvis 12/23/2013.   FINDINGS: CTA CHEST FINDINGS   Cardiovascular: Heart size is normal. There is no significant pericardial fluid, thickening or pericardial calcification. No atherosclerotic calcifications in the thoracic aorta or the coronary arteries. Severe thickening and calcification of the aortic valve.   Mediastinum/Lymph Nodes: No pathologically enlarged mediastinal or hilar lymph nodes. Please note that accurate exclusion of hilar  adenopathy is limited on noncontrast CT scans. Esophagus is unremarkable in appearance. No axillary lymphadenopathy.   Lungs/Pleura: No suspicious pulmonary nodules or masses are noted. No acute consolidative airspace disease. No pleural effusions.   Musculoskeletal/Soft Tissues: There are no aggressive appearing lytic or blastic lesions noted in the visualized portions of the skeleton. Spinal cord stimulator in the midthoracic region incidentally noted.   CTA ABDOMEN AND PELVIS FINDINGS   Hepatobiliary: No suspicious cystic or solid hepatic lesions. No intra or extrahepatic biliary ductal dilatation. Status post cholecystectomy.   Pancreas: No pancreatic mass. No pancreatic  ductal dilatation. No pancreatic or peripancreatic fluid collections or inflammatory changes.   Spleen: Unremarkable.   Adrenals/Urinary Tract: Bilateral kidneys and bilateral adrenal glands are normal in appearance. No hydroureteronephrosis.   Stomach/Bowel: Normal appearance of the stomach. No pathologic dilatation of small bowel or colon. The appendix is not confidently identified and may be surgically absent. Regardless, there are no inflammatory changes noted adjacent to the cecum to suggest the presence of an acute appendicitis at this time.   Vascular/Lymphatic: Minimal aortic atherosclerosis, without evidence of aneurysm or dissection noted in the abdominal or pelvic vasculature. Vascular findings and measurements pertinent to potential TAVR procedure, as detailed below. No lymphadenopathy noted in the abdomen or pelvis.   Reproductive: Status post hysterectomy. Ovaries are not confidently identified may be surgically absent or atrophic.   Other: No significant volume of ascites.  No pneumoperitoneum.   Musculoskeletal: There are no aggressive appearing lytic or blastic lesions noted in the visualized portions of the skeleton.   VASCULAR MEASUREMENTS PERTINENT TO TAVR:   AORTA:   Minimal Aortic Diameter-11 x 12 mm   Severity of Aortic Calcification-mild   RIGHT PELVIS:   Right Common Iliac Artery -   Minimal Diameter-7.3 x 6.6 mm   Tortuosity - mild   Calcification-none   Right External Iliac Artery -   Minimal Diameter-5.5 x 6.1 mm   Tortuosity - mild   Calcification - none   Right Common Femoral Artery -   Minimal Diameter-5.6 x 6.0 mm   Tortuosity - mild   Calcification - none   LEFT PELVIS:   Left Common Iliac Artery -   Minimal Diameter-6.6 x 7.1 mm   Tortuosity - mild   Calcification - none   Left External Iliac Artery -   Minimal Diameter-5.8 x 6.1 mm   Tortuosity - mild   Calcification - none   Left Common Femoral Artery  -   Minimal Diameter-6.5 x 6.0 mm   Tortuosity-mild   Calcification - none   Review of the MIP images confirms the above findings.   IMPRESSION: 1. Vascular findings and measurements pertinent to potential TAVR procedure, as detailed above. 2. Severe thickening calcification of the aortic valve, compatible with reported clinical history of severe aortic stenosis. 3. Mild aortic atherosclerosis. 4. Additional incidental findings, as above.     Electronically Signed   By: Vinnie Langton M.D.   On: 02/10/2021 13:20       Impression:   Patient has stage D1 severe symptomatic aortic stenosis.  She describes chronic progression of symptoms of exertional shortness of breath and fatigue consistent with chronic diastolic congestive heart failure, [place] Heart Association functional class II.   I have personally reviewed the patient's recent transthoracic echocardiogram, diagnostic cardiac catheterization, EKG, and CT angiograms.  Echocardiogram performed January 10, 2021 reveals normal left ventricular systolic function with ejection fraction estimated 65 to 70%.  The aortic valve is trileaflet  with moderate to severe thickening and calcification involving all 3 leaflets.  Peak velocity across aortic valve measured 3.6 m/s corresponding to mean transvalvular gradient estimated 34 mmHg and aortic valve area calculated 0.94 cm by VTI.  Stroke-volume index was 38 and DVI reported 0.37.  Patient had concomitant moderate aortic insufficiency with aortic valve pressure half-time reported 356 ms.  Of note, the aortic valve diameter is relatively small.  Diagnostic cardiac catheterization performed Feb 03, 2021 revealed nonobstructive coronary artery disease but confirmed the presence of severe aortic stenosis with mean transvalvular gradient measured at catheterization reported 58 mmHg corresponding to aortic valve area calculated only 0.50 cm.  EKG reveals sinus rhythm with no significant AV  conduction delay.  Cardiac-gated CTA of the heart reveals anatomical characteristics consistent with aortic stenosis suitable for treatment by transcatheter aortic valve replacement without any significant complicating features other than the presence of a relatively small sized aortic root and aortic annulus. CTA of the aorta and iliac vessels demonstrate what appears to be adequate pelvic vascular access to facilitate a transfemoral approach.       Plan:   The patient was counseled at length regarding treatment alternatives for management of severe aortic stenosis including continued medical therapy versus proceeding with aortic valve replacement in the near future.  The natural history of aortic stenosis was reviewed, as was long term prognosis with medical therapy alone.  Surgical options were discussed at length including conventional surgical aortic valve replacement through either a full median sternotomy or using minimally invasive techniques.  Other alternatives including transcatheter aortic valve replacement, conventional surgery with patch enlargement of the aortic root, stentless porcine aortic root replacement were discussed.  The patient is not interested in conventional surgery and desires to proceed with transcatheter aortic valve replacement.

## 2021-06-27 NOTE — Progress Notes (Signed)
Anesthesia Chart Review:  Case: [id number] Date/Time: 06/28/21 0715   Procedures:      TRANSCATHETER AORTIC VALVE REPLACEMENT, TRANSFEMORAL (Chest)     TRANSESOPHAGEAL ECHOCARDIOGRAM (TEE)   Anesthesia type: Monitor Anesthesia Care   Pre-op diagnosis: Severe Aortic Stenosis   Location: [facility] OR ROOM 16 / [place] [facility]   Surgeons: Burnell Blanks, MD     CT Surgeon: Gilford Raid, MD   DISCUSSION: Patient is a 65 year old female scheduled for the above procedure.    History includes never smoker, postoperative N/V, severe AS, HLD, GERD, hiatal hernia, IBS, fibromyalgia, anxiety, depression (with history of SI), elevated LFTs (2015), memory loss. She reported failed conscious sedation (woke up during procedure such as endoscopy and colonoscopy), back surgery (spinal cord implant 2005), dental extractions/alveoloplasty (05/26/21).   06/24/2021 presurgical COVID-19 test negative.  Anesthesia team to evaluate on the day of surgery.   VS: BP 121/74   Pulse 85   Temp 36.6 C (Oral)   Resp 18   Ht 5\' 4"  (1.626 m)   Wt 69.5 kg   SpO2 97%   BMI 26.30 kg/m    PROVIDERS: Caren Macadam, MD is PCP  Pixie Casino, MD is primary cardiologist Lauree Chandler, MD is structural heart cardiologist   LABS: Labs reviewed: Acceptable for surgery. (all labs ordered are listed, but only abnormal results are displayed)  Labs Reviewed  COMPREHENSIVE METABOLIC PANEL - Abnormal; Notable for the following components:      Result Value   Glucose, Bld 110 (*)    Calcium 8.8 (*)    Total Protein 6.1 (*)    All other components within normal limits  BLOOD GAS, ARTERIAL - Abnormal; Notable for the following components:   Allens test (pass/fail) BRACHIAL ARTERY (*)    All other components within normal limits  SURGICAL PCR SCREEN  SARS CORONAVIRUS 2 (TAT 6-24 HRS)  CBC  PROTIME-INR  URINALYSIS, ROUTINE W REFLEX MICROSCOPIC  TYPE AND SCREEN     IMAGES: CXR 06/24/21: FINDINGS: Lungs are  adequately inflated and otherwise clear. Cardiomediastinal silhouette and remainder of the exam is unchanged. Neurostimulator device unchanged. IMPRESSION: No active cardiopulmonary disease.   CTA chest/abd/pelvis 02/10/21: IMPRESSION: 1. Vascular findings and measurements pertinent to potential TAVR procedure, as detailed above. [See full report] 2. Severe thickening calcification of the aortic valve, compatible with reported clinical history of severe aortic stenosis. 3. Mild aortic atherosclerosis. 4. Additional incidental findings, as above. [See full report]     EKG: 06/24/21: Normal sinus rhythm Septal infarct , age undetermined Abnormal ECG Since last tracing rate slower Confirmed by Charolette Forward ([id number]) on 06/24/2021 10:23:01 AM    CV: US Carotid 02/10/21: Summary:  - Right Carotid: The extracranial vessels were near-normal with only minimal wall thickening or plaque.  - Left Carotid: The extracranial vessels were near-normal with only minimal wall thickening or plaque.  - Vertebrals:  Bilateral vertebral arteries demonstrate antegrade flow.  - Subclavians: Normal flow hemodynamics were seen in bilateral subclavian arteries.    CT Coronary 02/10/21: IMPRESSION: 1. Tricuspid aortic valve with severely thickened leaflets and moderate calcifications. 2. Annular measurements are small (326 mm2). Would consider 26 mm Evolut Pro. 3. No significant annular or subannular calcifications. 4. Sufficient coronary to annulus distance. 5. Optimal Fluoroscopic Angle for Delivery: LAO 15 CAU 4   Cardiac cath 02/03/21: 1. No angiographic evidence of CAD 2. Severe aortic stenosis with mean gradient 58 mmHg, peak to peak gradient 59 mmHg, AVA 0.50 cm2 - Will continue  workup for TAVR   Echo 01/10/21: IMPRESSIONS   1. Left ventricular ejection fraction, by estimation, is 65 to 70%. The  left ventricle has normal function. The left ventricle has no regional  wall motion abnormalities.  There is mild left ventricular hypertrophy.  Left ventricular diastolic parameters  are consistent with Grade I diastolic dysfunction (impaired relaxation).  The average left ventricular global longitudinal strain is -13.8 %. The  global longitudinal strain is abnormal.   2. Right ventricular systolic function is normal. The right ventricular  size is normal. Tricuspid regurgitation signal is inadequate for assessing  PA pressure.   3. The mitral valve is normal in structure. No evidence of mitral valve  regurgitation. No evidence of mitral stenosis.   4. The aortic valve is tricuspid. Aortic valve regurgitation is mild.  Severe aortic valve stenosis (severe by continuity equation AVA, moderate  by mean gradient). Aortic valve area, by VTI measures 0.94 cm. Aortic  valve mean gradient measures 34.0  mmHg.   5. The inferior vena cava is normal in size with greater than 50%  respiratory variability, suggesting right atrial pressure of 3 mmHg.    Past Medical History:  Diagnosis Date   ABDOMINAL PAIN, LOWER 02/16/2009   Anxiety    Aortic stenosis    APPENDECTOMY, HX OF 09/11/2007   ATTENTION DEFICIT DISORDER, ADULT 02/02/2009   BACK PAIN 07/16/2007   chronic   Back pain    spinal stimulator in place   CERVICAL RADICULOPATHY, RIGHT 07/16/2007   CHEST PAIN, ACUTE 09/03/2008   Chronic abdominal pain    Chronic pain syndrome 18/56/3149   Complication of anesthesia    woke up during surgeries few times and woke up during endoscopy and colonscopy few weeks ago, in terrible pain   DDD (degenerative disc disease), lumbosacral    DEPRESSION/ANXIETY 06/17/2010   Dysuria 02/20/2008   Elevated liver enzymes 1 and  1/2 months ago   Family history of anesthesia complication    mother woke up during surgeries   Fibromyalgia    Functional GI symptoms    FX, RAMUS NOS, CLOSED 07/16/2007   GERD (gastroesophageal reflux disease)    no current problems   H/O failed conscious sedation    PT  STATES SHE IS HARD TO SEDATE!   HEMORRHOIDS 09/11/2007   HIATAL HERNIA, HX OF 09/11/2007   History of kidney stones    passed stones   HX, PERSONAL, MUSCULOSKELETAL DISORD NEC 07/16/2007   HX, PERSONAL, URINARY CALCULI 07/16/2007   Hyperlipidemia    on crestor   Irritable bowel syndrome 09/11/2007   Memory loss    NEOPLASM, SKIN, UNCERTAIN BEHAVIOR 70/26/3785   OSTEOARTHRITIS 09/11/2007   oa   OSTEOPENIA 10/23/2007   Pneumonia    PONV (postoperative nausea and vomiting)    ROTATOR CUFF REPAIR, HX OF 09/11/2007   Stroke ([place])    mini stroke "years ago" per patient   Suicidal ideations    SYMPTOM, PAIN, ABDOMINAL, RIGHT UP QUADRANT 07/16/2007   TAH/BSO, HX OF 09/11/2007   UTI 10/06/2010   VAGINITIS, ATROPHIC, POSTMENOPAUSAL 07/16/2007    Past Surgical History:  Procedure Laterality Date   ABDOMINAL HYSTERECTOMY  1976   removed due to abnormal paps; no uterine cancer   [place]   bone pushed back into  place after fracture Left 25-30 yrs ago   face   CARDIAC CATHETERIZATION     cervical radiculopathy  20 yrs ago   RT  CHOLECYSTECTOMY N/A 02/18/2014   Procedure: LAPAROSCOPIC CHOLECYSTECTOMY;  Surgeon: Harl Bowie, MD;  Location: [facility];  Service: General;  Laterality: N/A;   COLONOSCOPY     DILATION AND CURETTAGE OF UTERUS  age 75   ESOPHAGOGASTRODUODENOSCOPY     MULTIPLE EXTRACTIONS WITH ALVEOLOPLASTY N/A 05/26/2021   Procedure: MULTIPLE EXTRACTION WITH ALVEOLOPLASTY;  Surgeon: Charlaine Dalton, DMD;  Location: [facility];  Service: Dentistry;  Laterality: N/A;   ORIF ANKLE FRACTURE Left 07/12/2017   Procedure: OPEN REDUCTION INTERNAL FIXATION (ORIF) LEFT ANKLE FRACTURE;  Surgeon: Carole Civil, MD;  Location: [facility];  Service: Orthopedics;  Laterality: Left;   RIGHT/LEFT HEART CATH AND CORONARY ANGIOGRAPHY N/A 02/03/2021   Procedure: RIGHT/LEFT HEART CATH AND CORONARY ANGIOGRAPHY;  Surgeon: Burnell Blanks, MD;   Location: [facility];  Service: Cardiovascular;  Laterality: N/A;   [place]   right   SPINAL CORD STIMULATOR IMPLANT  2005   lower back, pt turned off 2-3 weeks ago due to causing pain   TUBAL LIGATION      MEDICATIONS:  acyclovir ointment (ZOVIRAX) 5 %   ALPRAZolam (XANAX) 1 MG tablet   amphetamine-dextroamphetamine (ADDERALL) 30 MG tablet   Artificial Tear Ointment (DRY EYES OP)   cyclobenzaprine (FLEXERIL) 10 MG tablet   diclofenac Sodium (VOLTAREN) 1 % GEL   Docusate Calcium (STOOL SOFTENER PO)   gabapentin (NEURONTIN) 300 MG capsule   Heating Pad PADS   hydrOXYzine (ATARAX/VISTARIL) 25 MG tablet   ibuprofen (ADVIL) 200 MG tablet   metoprolol tartrate (LOPRESSOR) 100 MG tablet   ondansetron (ZOFRAN-ODT) 4 MG disintegrating tablet   Oxycodone HCl 10 MG TABS   promethazine (PHENERGAN) 25 MG suppository   rosuvastatin (CRESTOR) 20 MG tablet   VIIBRYD 40 MG TABS   No current facility-administered medications for this encounter.    [START ON 06/28/2021] dexmedetomidine (PRECEDEX) 400 MCG/100ML (4 mcg/mL) infusion   [START ON 06/28/2021] heparin 30,000 units/NS 1000 mL solution for CELLSAVER   [START ON 06/28/2021] magnesium sulfate (IV Push/IM) injection 40 mEq   [START ON 06/28/2021] norepinephrine (LEVOPHED) 4mg  in 228mL premix infusion   [START ON 06/28/2021] potassium chloride injection 80 mEq   [START ON 06/28/2021] vancomycin (VANCOCIN) IVPB 1000 mg/200 mL premix    Myra Gianotti, PA-C Surgical Short Stay/Anesthesiology [facility] Phone [phone] [facility] Phone [phone] 06/27/2021 11:53 AM

## 2021-06-28 ENCOUNTER — Encounter: Payer: Self-pay | Admitting: Cardiology

## 2021-06-28 ENCOUNTER — Encounter (HOSPITAL_COMMUNITY): Payer: Self-pay | Admitting: Cardiovascular Disease

## 2021-06-28 ENCOUNTER — Encounter (HOSPITAL_COMMUNITY)
Admission: RE | Disposition: A | Discharge status: Home / Self Care | Payer: Self-pay | Source: Ambulatory Visit | Attending: Cardiovascular Disease

## 2021-06-28 ENCOUNTER — Inpatient Hospital Stay (HOSPITAL_COMMUNITY): Payer: Medicare Other | Admitting: Anesthesiology

## 2021-06-28 ENCOUNTER — Other Ambulatory Visit: Payer: Self-pay | Admitting: Cardiology

## 2021-06-28 ENCOUNTER — Inpatient Hospital Stay (HOSPITAL_COMMUNITY): Payer: Medicare Other

## 2021-06-28 ENCOUNTER — Other Ambulatory Visit: Payer: Self-pay

## 2021-06-28 ENCOUNTER — Inpatient Hospital Stay (HOSPITAL_COMMUNITY): Payer: Medicare Other | Admitting: Vascular Surgery

## 2021-06-28 ENCOUNTER — Inpatient Hospital Stay (HOSPITAL_COMMUNITY)
Admission: RE | Admit: 2021-06-28 | Discharge: 2021-06-29 | DRG: 267 | Disposition: A | Discharge status: Home / Self Care | Payer: Medicare Other | Source: Ambulatory Visit | Attending: Cardiovascular Disease | Admitting: Cardiovascular Disease

## 2021-06-28 DIAGNOSIS — I2510 Atherosclerotic heart disease of native coronary artery without angina pectoris: Secondary | ICD-10-CM | POA: Diagnosis not present

## 2021-06-28 DIAGNOSIS — Z90710 Acquired absence of both cervix and uterus: Secondary | ICD-10-CM

## 2021-06-28 DIAGNOSIS — Z20822 Contact with and (suspected) exposure to covid-19: Secondary | ICD-10-CM | POA: Diagnosis not present

## 2021-06-28 DIAGNOSIS — K219 Gastro-esophageal reflux disease without esophagitis: Secondary | ICD-10-CM | POA: Diagnosis present

## 2021-06-28 DIAGNOSIS — Z800 Family history of malignant neoplasm of digestive organs: Secondary | ICD-10-CM

## 2021-06-28 DIAGNOSIS — I5032 Chronic diastolic (congestive) heart failure: Secondary | ICD-10-CM | POA: Diagnosis present

## 2021-06-28 DIAGNOSIS — Z952 Presence of prosthetic heart valve: Secondary | ICD-10-CM | POA: Diagnosis not present

## 2021-06-28 DIAGNOSIS — F419 Anxiety disorder, unspecified: Secondary | ICD-10-CM | POA: Diagnosis present

## 2021-06-28 DIAGNOSIS — Z006 Encounter for examination for normal comparison and control in clinical research program: Secondary | ICD-10-CM

## 2021-06-28 DIAGNOSIS — I110 Hypertensive heart disease with heart failure: Secondary | ICD-10-CM | POA: Diagnosis not present

## 2021-06-28 DIAGNOSIS — Z79899 Other long term (current) drug therapy: Secondary | ICD-10-CM

## 2021-06-28 DIAGNOSIS — E785 Hyperlipidemia, unspecified: Secondary | ICD-10-CM | POA: Diagnosis not present

## 2021-06-28 DIAGNOSIS — F32A Depression, unspecified: Secondary | ICD-10-CM | POA: Diagnosis present

## 2021-06-28 DIAGNOSIS — Z885 Allergy status to narcotic agent status: Secondary | ICD-10-CM | POA: Diagnosis not present

## 2021-06-28 DIAGNOSIS — Z9049 Acquired absence of other specified parts of digestive tract: Secondary | ICD-10-CM | POA: Diagnosis not present

## 2021-06-28 DIAGNOSIS — I350 Nonrheumatic aortic (valve) stenosis: Secondary | ICD-10-CM | POA: Diagnosis not present

## 2021-06-28 DIAGNOSIS — Z8781 Personal history of (healed) traumatic fracture: Secondary | ICD-10-CM

## 2021-06-28 DIAGNOSIS — Z823 Family history of stroke: Secondary | ICD-10-CM

## 2021-06-28 DIAGNOSIS — M797 Fibromyalgia: Secondary | ICD-10-CM | POA: Diagnosis not present

## 2021-06-28 DIAGNOSIS — Z9889 Other specified postprocedural states: Secondary | ICD-10-CM

## 2021-06-28 DIAGNOSIS — Z8249 Family history of ischemic heart disease and other diseases of the circulatory system: Secondary | ICD-10-CM

## 2021-06-28 DIAGNOSIS — G894 Chronic pain syndrome: Secondary | ICD-10-CM | POA: Diagnosis not present

## 2021-06-28 DIAGNOSIS — F341 Dysthymic disorder: Secondary | ICD-10-CM | POA: Diagnosis present

## 2021-06-28 HISTORY — PX: TEE WITHOUT CARDIOVERSION: SHX5443

## 2021-06-28 HISTORY — PX: TRANSCATHETER AORTIC VALVE REPLACEMENT, TRANSFEMORAL: SHX6400

## 2021-06-28 HISTORY — DX: Presence of prosthetic heart valve: Z95.2

## 2021-06-28 LAB — POCT I-STAT, CHEM 8
BUN: 8 mg/dL (ref 8–23)
BUN: 7 mg/dL — ABNORMAL LOW (ref 8–23)
Calcium, Ion: 1.19 mmol/L (ref 1.15–1.40)
Calcium, Ion: 1.24 mmol/L (ref 1.15–1.40)
Chloride: 103 mmol/L (ref 98–111)
Chloride: 103 mmol/L (ref 98–111)
Creatinine, Ser: 0.8 mg/dL (ref 0.44–1.00)
Creatinine, Ser: 0.9 mg/dL (ref 0.44–1.00)
Glucose, Bld: 113 mg/dL — ABNORMAL HIGH (ref 70–99)
Glucose, Bld: 141 mg/dL — ABNORMAL HIGH (ref 70–99)
HCT: 37 % (ref 36.0–46.0)
HCT: 36 % (ref 36.0–46.0)
Hemoglobin: 12.6 g/dL (ref 12.0–15.0)
Hemoglobin: 12.2 g/dL (ref 12.0–15.0)
Potassium: 3.8 mmol/L (ref 3.5–5.1)
Potassium: 3.4 mmol/L — ABNORMAL LOW (ref 3.5–5.1)
Sodium: 140 mmol/L (ref 135–145)
Sodium: 140 mmol/L (ref 135–145)
TCO2: 25 mmol/L (ref 22–32)
TCO2: 26 mmol/L (ref 22–32)

## 2021-06-28 LAB — POCT I-STAT 7, (LYTES, BLD GAS, ICA,H+H)
Acid-Base Excess: 1 mmol/L (ref 0.0–2.0)
Bicarbonate: 26.1 mmol/L (ref 20.0–28.0)
Calcium, Ion: 1.21 mmol/L (ref 1.15–1.40)
HCT: 35 % — ABNORMAL LOW (ref 36.0–46.0)
Hemoglobin: 11.9 g/dL — ABNORMAL LOW (ref 12.0–15.0)
O2 Saturation: 99 %
Potassium: 3.7 mmol/L (ref 3.5–5.1)
Sodium: 140 mmol/L (ref 135–145)
TCO2: 27 mmol/L (ref 22–32)
pCO2 arterial: 44.2 mmHg (ref 32.0–48.0)
pH, Arterial: 7.379 (ref 7.350–7.450)
pO2, Arterial: 169 mmHg — ABNORMAL HIGH (ref 83.0–108.0)

## 2021-06-28 LAB — ECHOCARDIOGRAM LIMITED
AR max vel: 1.82 cm2
AV Area VTI: 2.39 cm2
AV Area mean vel: 2.37 cm2
AV Mean grad: 4 mmHg
AV Peak grad: 9.2 mmHg
Ao pk vel: 1.52 m/s

## 2021-06-28 LAB — ABO/RH: ABO/RH(D): O POS

## 2021-06-28 SURGERY — IMPLANTATION, AORTIC VALVE, TRANSCATHETER, FEMORAL APPROACH
Anesthesia: Monitor Anesthesia Care | Site: Chest

## 2021-06-28 MED ORDER — SODIUM CHLORIDE 0.9 % IV SOLN: INTRAVENOUS | Status: DC

## 2021-06-28 MED ORDER — ORAL CARE MOUTH RINSE: 15.0000 mL | Freq: Once | OROMUCOSAL | Status: AC

## 2021-06-28 MED ORDER — MORPHINE SULFATE (PF) 2 MG/ML IV SOLN: 1.0000 mg | INTRAVENOUS | Status: DC | PRN

## 2021-06-28 MED ORDER — HEPARIN SODIUM (PORCINE) 1000 UNIT/ML IJ SOLN
INTRAMUSCULAR | Status: DC | PRN
  Administered 2021-06-28: 11000 [IU] via INTRAVENOUS

## 2021-06-28 MED ORDER — CHLORHEXIDINE GLUCONATE 0.12 % MT SOLN: 15.0000 mL | Freq: Once | OROMUCOSAL | Status: DC

## 2021-06-28 MED ORDER — SODIUM CHLORIDE 0.9 % IV SOLN: INTRAVENOUS | Status: AC

## 2021-06-28 MED ORDER — HEPARIN 5000 UNITS IN NS 1000 ML (FLUSH)
INTRAMUSCULAR | Status: DC | PRN
  Administered 2021-06-28 (×3): 500 mL via INTRAMUSCULAR

## 2021-06-28 MED ORDER — LACTATED RINGERS IV SOLN: INTRAVENOUS | Status: DC

## 2021-06-28 MED ORDER — SODIUM CHLORIDE 0.9% FLUSH
3.0000 mL | Freq: Two times a day (BID) | INTRAVENOUS | Status: DC
Start: 2021-06-28 — End: 2021-06-29
  Administered 2021-06-28: 3 mL via INTRAVENOUS

## 2021-06-28 MED ORDER — MIDAZOLAM HCL 2 MG/2ML IJ SOLN
INTRAMUSCULAR | Status: AC
  Filled 2021-06-28: qty 2

## 2021-06-28 MED ORDER — FENTANYL CITRATE (PF) 250 MCG/5ML IJ SOLN
INTRAMUSCULAR | Status: AC
  Filled 2021-06-28: qty 5

## 2021-06-28 MED ORDER — SODIUM CHLORIDE 0.9 % IV SOLN
250.0000 mL | INTRAVENOUS | Status: DC
Start: 2021-06-28 — End: 2021-06-29

## 2021-06-28 MED ORDER — FENTANYL CITRATE (PF) 250 MCG/5ML IJ SOLN
INTRAMUSCULAR | Status: DC | PRN
  Administered 2021-06-28 (×2): 50 ug via INTRAVENOUS

## 2021-06-28 MED ORDER — CHLORHEXIDINE GLUCONATE 4 % EX LIQD: 60.0000 mL | Freq: Once | CUTANEOUS | Status: DC

## 2021-06-28 MED ORDER — ONDANSETRON HCL 4 MG/2ML IJ SOLN: 4.0000 mg | Freq: Four times a day (QID) | INTRAMUSCULAR | Status: DC | PRN

## 2021-06-28 MED ORDER — ACETAMINOPHEN 325 MG PO TABS
650.0000 mg | ORAL_TABLET | Freq: Four times a day (QID) | ORAL | Status: DC | PRN
  Administered 2021-06-28 – 2021-06-29 (×4): 650 mg via ORAL
  Filled 2021-06-28 (×4): qty 2

## 2021-06-28 MED ORDER — OXYCODONE HCL 5 MG PO TABS
5.0000 mg | ORAL_TABLET | ORAL | Status: DC | PRN
  Administered 2021-06-28 – 2021-06-29 (×8): 10 mg via ORAL
  Filled 2021-06-28 (×8): qty 2

## 2021-06-28 MED ORDER — VANCOMYCIN HCL IN DEXTROSE 1-5 GM/200ML-% IV SOLN
1000.0000 mg | Freq: Once | INTRAVENOUS | Status: AC
Start: 2021-06-28 — End: 2021-06-28
  Administered 2021-06-28: 1000 mg via INTRAVENOUS
  Filled 2021-06-28: qty 200

## 2021-06-28 MED ORDER — PROTAMINE SULFATE 10 MG/ML IV SOLN
INTRAVENOUS | Status: AC
  Filled 2021-06-28: qty 10

## 2021-06-28 MED ORDER — ACETAMINOPHEN 650 MG RE SUPP: 650.0000 mg | Freq: Four times a day (QID) | RECTAL | Status: DC | PRN

## 2021-06-28 MED ORDER — 0.9 % SODIUM CHLORIDE (POUR BTL) OPTIME
TOPICAL | Status: DC | PRN
  Administered 2021-06-28: 1000 mL
  Administered 2021-06-28: 2000 mL

## 2021-06-28 MED ORDER — PHENYLEPHRINE HCL-NACL 20-0.9 MG/250ML-% IV SOLN
INTRAVENOUS | Status: DC | PRN
  Administered 2021-06-28: 25 ug/min via INTRAVENOUS

## 2021-06-28 MED ORDER — PHENYLEPHRINE HCL-NACL 20-0.9 MG/250ML-% IV SOLN: 0.0000 ug/min | INTRAVENOUS | Status: DC

## 2021-06-28 MED ORDER — CLOPIDOGREL BISULFATE 75 MG PO TABS
75.0000 mg | ORAL_TABLET | Freq: Every day | ORAL | Status: DC
  Administered 2021-06-29: 75 mg via ORAL
  Filled 2021-06-28: qty 1

## 2021-06-28 MED ORDER — LIDOCAINE HCL 1 % IJ SOLN
INTRAMUSCULAR | Status: DC | PRN
  Administered 2021-06-28: 20 mL

## 2021-06-28 MED ORDER — LIDOCAINE HCL (PF) 1 % IJ SOLN
INTRAMUSCULAR | Status: AC
  Filled 2021-06-28: qty 30

## 2021-06-28 MED ORDER — PROPOFOL 500 MG/50ML IV EMUL
INTRAVENOUS | Status: DC | PRN
  Administered 2021-06-28: 25 ug/kg/min via INTRAVENOUS

## 2021-06-28 MED ORDER — IODIXANOL 320 MG/ML IV SOLN
INTRAVENOUS | Status: DC | PRN
  Administered 2021-06-28: 80 mL

## 2021-06-28 MED ORDER — EPHEDRINE SULFATE-NACL 50-0.9 MG/10ML-% IV SOSY
PREFILLED_SYRINGE | INTRAVENOUS | Status: DC | PRN
  Administered 2021-06-28: 5 mg via INTRAVENOUS

## 2021-06-28 MED ORDER — MIDAZOLAM HCL 2 MG/2ML IJ SOLN
INTRAMUSCULAR | Status: DC | PRN
  Administered 2021-06-28: 2 mg via INTRAVENOUS

## 2021-06-28 MED ORDER — CHLORHEXIDINE GLUCONATE 4 % EX LIQD: 30.0000 mL | CUTANEOUS | Status: DC

## 2021-06-28 MED ORDER — HEPARIN 6000 UNIT IRRIGATION SOLUTION
Status: AC
  Filled 2021-06-28: qty 1000

## 2021-06-28 MED ORDER — ONDANSETRON HCL 4 MG/2ML IJ SOLN
INTRAMUSCULAR | Status: DC | PRN
  Administered 2021-06-28: 4 mg via INTRAVENOUS

## 2021-06-28 MED ORDER — PROTAMINE SULFATE 10 MG/ML IV SOLN
INTRAVENOUS | Status: DC | PRN
  Administered 2021-06-28 (×2): 50 mg via INTRAVENOUS
  Administered 2021-06-28: 10 mg via INTRAVENOUS

## 2021-06-28 MED ORDER — HEPARIN SODIUM (PORCINE) 1000 UNIT/ML IJ SOLN
INTRAMUSCULAR | Status: AC
  Filled 2021-06-28: qty 2

## 2021-06-28 MED ORDER — SODIUM CHLORIDE 0.9% FLUSH: 3.0000 mL | INTRAVENOUS | Status: DC | PRN

## 2021-06-28 MED ORDER — FENTANYL CITRATE (PF) 100 MCG/2ML IJ SOLN: 25.0000 ug | INTRAMUSCULAR | Status: DC | PRN

## 2021-06-28 MED ORDER — PROPOFOL 10 MG/ML IV BOLUS
INTRAVENOUS | Status: AC
  Filled 2021-06-28: qty 20

## 2021-06-28 MED ORDER — TRAMADOL HCL 50 MG PO TABS
50.0000 mg | ORAL_TABLET | ORAL | Status: DC | PRN
  Administered 2021-06-28: 100 mg via ORAL
  Filled 2021-06-28: qty 2

## 2021-06-28 MED ORDER — NITROGLYCERIN IN D5W 200-5 MCG/ML-% IV SOLN: 0.0000 ug/min | INTRAVENOUS | Status: DC

## 2021-06-28 MED ORDER — CHLORHEXIDINE GLUCONATE 0.12 % MT SOLN: 15.0000 mL | Freq: Once | OROMUCOSAL | Status: AC

## 2021-06-28 MED ORDER — DIPHENHYDRAMINE HCL 25 MG PO CAPS
25.0000 mg | ORAL_CAPSULE | Freq: Three times a day (TID) | ORAL | Status: DC | PRN
  Administered 2021-06-28: 25 mg via ORAL
  Filled 2021-06-28: qty 1

## 2021-06-28 MED ORDER — CHLORHEXIDINE GLUCONATE 0.12 % MT SOLN
OROMUCOSAL | Status: AC
  Administered 2021-06-28: 15 mL via OROMUCOSAL
  Filled 2021-06-28: qty 15

## 2021-06-28 MED ORDER — SODIUM CHLORIDE 0.9 % IV SOLN: 250.0000 mL | INTRAVENOUS | Status: DC | PRN

## 2021-06-28 MED ORDER — DIPHENHYDRAMINE HCL 50 MG/ML IJ SOLN
INTRAMUSCULAR | Status: DC | PRN
  Administered 2021-06-28: 12.5 mg via INTRAVENOUS

## 2021-06-28 SURGICAL SUPPLY — 85 items
BAG COUNTER SPONGE SURGICOUNT (BAG) ×2 IMPLANT
BAG DECANTER FOR FLEXI CONT (MISCELLANEOUS) IMPLANT
BAG SNAP BAND KOVER 36X36 (MISCELLANEOUS) ×2 IMPLANT
BLADE CLIPPER SURG (BLADE) IMPLANT
BLADE OSCILLATING /SAGITTAL (BLADE) IMPLANT
BLADE STERNUM SYSTEM 6 (BLADE) IMPLANT
BLADE SURG 10 STRL SS (BLADE) IMPLANT
CABLE ADAPT CONN TEMP 6FT (ADAPTER) ×2 IMPLANT
CATH DIAG EXPO 6F AL1 (CATHETERS) IMPLANT
CATH DIAG EXPO 6F VENT PIG 145 (CATHETERS) ×4 IMPLANT
CATH EXTERNAL FEMALE PUREWICK (CATHETERS) IMPLANT
CATH INFINITI 6F AL2 (CATHETERS) IMPLANT
CATH S G BIP PACING (CATHETERS) ×2 IMPLANT
CHLORAPREP W/TINT 26 (MISCELLANEOUS) ×2 IMPLANT
CLIP VESOCCLUDE MED 24/CT (CLIP) IMPLANT
CLIP VESOCCLUDE SM WIDE 24/CT (CLIP) IMPLANT
CLOSURE MYNX CONTROL 6F/7F (Vascular Products) ×2 IMPLANT
CNTNR URN SCR LID CUP LEK RST (MISCELLANEOUS) ×2 IMPLANT
CONT SPEC 4OZ STRL OR WHT (MISCELLANEOUS) ×4
COVER BACK TABLE 80X110 HD (DRAPES) ×2 IMPLANT
DECANTER SPIKE VIAL GLASS SM (MISCELLANEOUS) ×2 IMPLANT
DERMABOND ADVANCED (GAUZE/BANDAGES/DRESSINGS) ×1
DERMABOND ADVANCED .7 DNX12 (GAUZE/BANDAGES/DRESSINGS) ×1 IMPLANT
DEVICE CLOSURE PERCLS PRGLD 6F (VASCULAR PRODUCTS) ×2 IMPLANT
DRAPE INCISE IOBAN 66X45 STRL (DRAPES) IMPLANT
DRSG TEGADERM 4X4.75 (GAUZE/BANDAGES/DRESSINGS) ×2 IMPLANT
ELECT CAUTERY BLADE 6.4 (BLADE) IMPLANT
ELECT REM PT RETURN 9FT ADLT (ELECTROSURGICAL) ×4
ELECTRODE REM PT RTRN 9FT ADLT (ELECTROSURGICAL) ×2 IMPLANT
FELT TEFLON 6X6 (MISCELLANEOUS) IMPLANT
GAUZE SPONGE 4X4 12PLY STRL (GAUZE/BANDAGES/DRESSINGS) ×2 IMPLANT
GLOVE EUDERMIC 7 POWDERFREE (GLOVE) IMPLANT
GLOVE SURG ENC MOIS LTX SZ7.5 (GLOVE) ×2 IMPLANT
GLOVE SURG ENC MOIS LTX SZ8 (GLOVE) IMPLANT
GLOVE SURG ORTHO LTX SZ7.5 (GLOVE) IMPLANT
GOWN STRL REUS W/ TWL LRG LVL3 (GOWN DISPOSABLE) IMPLANT
GOWN STRL REUS W/ TWL XL LVL3 (GOWN DISPOSABLE) ×1 IMPLANT
GOWN STRL REUS W/TWL LRG LVL3 (GOWN DISPOSABLE)
GOWN STRL REUS W/TWL XL LVL3 (GOWN DISPOSABLE) ×2
GUIDEWIRE SAFE TJ AMPLATZ EXST (WIRE) ×2 IMPLANT
INSERT FOGARTY SM (MISCELLANEOUS) IMPLANT
KIT BASIN OR (CUSTOM PROCEDURE TRAY) ×2 IMPLANT
KIT HEART LEFT (KITS) ×2 IMPLANT
KIT SUCTION CATH 14FR (SUCTIONS) IMPLANT
KIT TURNOVER KIT B (KITS) ×2 IMPLANT
LOOP VESSEL MAXI BLUE (MISCELLANEOUS) IMPLANT
LOOP VESSEL MINI RED (MISCELLANEOUS) IMPLANT
NS IRRIG 1000ML POUR BTL (IV SOLUTION) ×2 IMPLANT
PACK ENDO MINOR (CUSTOM PROCEDURE TRAY) ×2 IMPLANT
PAD ARMBOARD 7.5X6 YLW CONV (MISCELLANEOUS) ×4 IMPLANT
PAD ELECT DEFIB RADIOL ZOLL (MISCELLANEOUS) ×2 IMPLANT
PENCIL BUTTON HOLSTER BLD 10FT (ELECTRODE) IMPLANT
PERCLOSE PROGLIDE 6F (VASCULAR PRODUCTS) ×4
POSITIONER HEAD DONUT 9IN (MISCELLANEOUS) ×2 IMPLANT
SET MICROPUNCTURE 5F STIFF (MISCELLANEOUS) ×2 IMPLANT
SHEATH BRITE TIP 7FR 35CM (SHEATH) ×2 IMPLANT
SHEATH PINNACLE 6F 10CM (SHEATH) ×2 IMPLANT
SHEATH PINNACLE 8F 10CM (SHEATH) ×2 IMPLANT
SLEEVE REPOSITIONING LENGTH 30 (MISCELLANEOUS) ×2 IMPLANT
STOPCOCK MORSE 400PSI 3WAY (MISCELLANEOUS) ×4 IMPLANT
SUT ETHIBOND X763 2 0 SH 1 (SUTURE) IMPLANT
SUT GORETEX CV 4 TH 22 36 (SUTURE) IMPLANT
SUT GORETEX CV4 TH-18 (SUTURE) IMPLANT
SUT MNCRL AB 3-0 PS2 18 (SUTURE) IMPLANT
SUT PROLENE 5 0 C 1 36 (SUTURE) IMPLANT
SUT PROLENE 6 0 C 1 30 (SUTURE) IMPLANT
SUT SILK  1 MH (SUTURE) ×2
SUT SILK 1 MH (SUTURE) ×1 IMPLANT
SUT VIC AB 2-0 CT1 27 (SUTURE)
SUT VIC AB 2-0 CT1 TAPERPNT 27 (SUTURE) IMPLANT
SUT VIC AB 2-0 CTX 36 (SUTURE) IMPLANT
SUT VIC AB 3-0 SH 8-18 (SUTURE) IMPLANT
SYR 50ML LL SCALE MARK (SYRINGE) ×2 IMPLANT
SYR BULB IRRIG 60ML STRL (SYRINGE) IMPLANT
SYR MEDRAD MARK V 150ML (SYRINGE) ×2 IMPLANT
SYS DEL EVOLUT PROPLS 23 26 29 (CATHETERS) ×2
SYS LOAD EVOLT PROPLS 23 26 29 (CATHETERS) ×2
SYSTEM DEL EVLT PRPLS 23 26 29 (CATHETERS) ×1 IMPLANT
SYSTEM LOAD EVLT PRPLS23 26 29 (CATHETERS) ×1 IMPLANT
TOWEL GREEN STERILE (TOWEL DISPOSABLE) ×4 IMPLANT
TRANSDUCER W/STOPCOCK (MISCELLANEOUS) ×4 IMPLANT
TRAY FOLEY SLVR 16FR TEMP STAT (SET/KITS/TRAYS/PACK) IMPLANT
VALVE AORTIC EVOLUT PROPLUS 26 (Valve) ×2 IMPLANT
WIRE EMERALD 3MM-J .035X150CM (WIRE) ×4 IMPLANT
WIRE EMERALD 3MM-J .035X260CM (WIRE) ×2 IMPLANT

## 2021-06-28 NOTE — CV Procedure (Signed)
[facility]  TAVR OPERATIVE NOTE   Date of Procedure:  06/28/2021  Preoperative Diagnosis: Severe Aortic Stenosis   Postoperative Diagnosis: Same   Procedure:   Transcatheter Aortic Valve Replacement - Transfemoral Approach  Medtronic Evolut Pro THV (size 26 mm, model # [id number], serial # [id number])   Co-Surgeons:  Lauree Chandler, MD and Gaye Pollack, MD   Anesthesiologist:  Ola Spurr  Echocardiographer:  Johnsie Cancel  Pre-operative Echo Findings: Severe aortic stenosis Normal left ventricular systolic function  Post-operative Echo Findings: No paravalvular leak Normal left ventricular systolic function  BRIEF CLINICAL NOTE AND INDICATIONS FOR SURGERY  65 yo female with history of chronic back pain, depression/anxiety, fibromyalgia, GERD, hyperlipidemia and severe aortic stenosis who is here today for TAVR. I saw her as a new consult in May 2022 for further discussion regarding her aortic stenosis and possible TAVR. She has many ongoing issues with pain and anxiety. She has noticed progressive dyspnea with exertion and fatigue. She has been followed for moderate aortic stenosis by Dr. Debara Pickett. Echo 01/10/21 with LVEF=65-70%, mild LVH. The aortic valve leaflets are thickened with limited leaflet excursion. Mean gradient 34 mmHg, peak gradient 52.4 mmHg, AVA 0.70 cm2, dimensionless index 0.37. Cardiac cath 02/03/21 with no evidence of CAD. Mean gradient 58 mmHg by cath. AVA 0.5 cm2. Cardiac CTA with suitable anatomy for placement of a 26 mm Medtronic Evolut Pro valve. We are planning to use femoral access. She has undergone dental extractions.   During the course of the patient's preoperative work up they have been evaluated comprehensively by a multidisciplinary team of specialists coordinated through the [facility] in the [facility].  They have been demonstrated to suffer from symptomatic severe aortic stenosis as  noted above. The patient has been counseled extensively as to the relative risks and benefits of all options for the treatment of severe aortic stenosis including long term medical therapy, conventional surgery for aortic valve replacement, and transcatheter aortic valve replacement.  The patient has been independently evaluated by Dr. Cyndia Bent with CT surgery and they are felt to be at high risk for conventional surgical aortic valve replacement. The surgeon indicated the patient would be a poor candidate for conventional surgery. Based upon review of all of the patient's preoperative diagnostic tests they are felt to be candidate for transcatheter aortic valve replacement using the transfemoral approach as an alternative to high risk conventional surgery.    Following the decision to proceed with transcatheter aortic valve replacement, a discussion has been held regarding what types of management strategies would be attempted intraoperatively in the event of life-threatening complications, including whether or not the patient would be considered a candidate for the use of cardiopulmonary bypass and/or conversion to open sternotomy for attempted surgical intervention.  The patient has been advised of a variety of complications that might develop peculiar to this approach including but not limited to risks of death, stroke, paravalvular leak, aortic dissection or other major vascular complications, aortic annulus rupture, device embolization, cardiac rupture or perforation, acute myocardial infarction, arrhythmia, heart block or bradycardia requiring permanent pacemaker placement, congestive heart failure, respiratory failure, renal failure, pneumonia, infection, other late complications related to structural valve deterioration or migration, or other complications that might ultimately cause a temporary or permanent loss of functional independence or other long term morbidity.  The patient provides full informed  consent for the procedure as described and all questions were answered preoperatively.    DETAILS OF THE  OPERATIVE PROCEDURE  PREPARATION:   The patient is brought to the operating room on the above mentioned date and central monitoring was established by the anesthesia team including placement of a radial arterial line. The patient is placed in the supine position on the operating table.  Intravenous antibiotics are administered. Conscious sedation is used.   Baseline transthoracic echocardiogram was performed. The patient's chest, abdomen, both groins, and both lower extremities are prepared and draped in a sterile manner. A time out procedure is performed.   PERIPHERAL ACCESS:   Using the modified Seldinger technique, femoral arterial and venous access were obtained with placement of a 6 Fr sheath in the artery and a 7 Fr sheath in the vein on the left side using u/s guidance.  A pigtail diagnostic catheter was passed through the femoral arterial sheath under fluoroscopic guidance into the aortic root.  A temporary transvenous pacemaker catheter was passed through the femoral venous sheath under fluoroscopic guidance into the right ventricle.  The pacemaker was tested to ensure stable lead placement and pacemaker capture. Aortic root angiography was performed in order to determine the optimal angiographic angle for valve deployment.  TRANSFEMORAL ACCESS:  A micropuncture kit was used to gain access to the right femoral artery using u/s guidance. Pre-closure with double ProGlide closure devices. The patient was heparinized systemically and ACT verified > 250 seconds.  A J wire was advanced into the LV with an AL-1 catheter and then changed out for a pigtail catheter in the LV. A Confida wire was then advanced through the pigtail catheter and placed in the LV.   TRANSCATHETER HEART VALVE DEPLOYMENT:  A Medtronic Evolut Pro THV size 26 mm was prepared and per manufacturer's guidelines, and the  proper orientation of the valve is confirmed on the delivery system.  The valve was advanced over the wire with use of the in-line sheath. The valve was then advanced across the aortic arch. The valve was carefully positioned across the aortic valve annulus. Once final position of the valve has been confirmed with angiographic assessment in the cusp overlap view and in the LAO view, the valve is deployed with controlled rapid pacing. The valve is taken to 80% deployment and appropriate depth is confirmed. The valve is then released from each paddle. There is no valvular leak and no central aortic insufficiency.  The patient's hemodynamic recovery following valve deployment is good.  Echo demostrated acceptable post-procedural gradients, stable mitral valve function, and no AI.   PROCEDURE COMPLETION:  The sheath was then removed and closure devices were completed. Protamine was administered once femoral arterial repair was complete. The temporary pacemaker, pigtail catheters and femoral sheaths were removed with a Mynx closure device placed in the artery and manual pressure used for venous hemostasis.    The patient tolerated the procedure well and is transported to the surgical intensive care in stable condition. There were no immediate intraoperative complications. All sponge instrument and needle counts are verified correct at completion of the operation.   No blood products were administered during the operation.  The patient received a total of 80 mL of intravenous contrast during the procedure.  Lauree Chandler MD 06/28/2021 9:48 AM

## 2021-06-28 NOTE — Progress Notes (Signed)
  Echocardiogram 2D Echocardiogram limited has been performed.  Darlina Sicilian M 06/28/2021, 9:39 AM

## 2021-06-28 NOTE — Transfer of Care (Signed)
Immediate Anesthesia Transfer of Care Note  Patient: [name] [name]  Procedure(s) Performed: TRANSCATHETER AORTIC VALVE REPLACEMENT, TRANSFEMORAL USING A 26MM MEDTRONIC AORTIC VALVE. (Chest) TRANSESOPHAGEAL ECHOCARDIOGRAM (TEE) (Chest)  Patient Location: Cath Lab  Anesthesia Type:MAC  Level of Consciousness: awake, alert  and oriented  Airway & Oxygen Therapy: Patient Spontanous Breathing and Patient connected to nasal cannula oxygen  Post-op Assessment: Report given to RN, Post -op Vital signs reviewed and stable and Patient moving all extremities X 4  Post vital signs: Reviewed and stable  Last Vitals:  Vitals Value Taken Time  BP 107/70 06/28/21 0953  Temp 36.4 C 06/28/21 0949  Pulse 68 06/28/21 0958  Resp 13 06/28/21 0958  SpO2 94 % 06/28/21 0958  Vitals shown include unvalidated device data.  Last Pain:  Vitals:   06/28/21 0949  TempSrc: Temporal  PainSc: 0-No pain      Patients Stated Pain Goal: 3 (16/38/45 3646)  Complications: No notable events documented.

## 2021-06-28 NOTE — Interval H&P Note (Signed)
History and Physical Interval Note:  06/28/2021 6:41 AM  [name] [name]  has presented today for surgery, with the diagnosis of Severe Aortic Stenosis.  The various methods of treatment have been discussed with the patient and family. After consideration of risks, benefits and other options for treatment, the patient has consented to  Procedure(s): TRANSCATHETER AORTIC VALVE REPLACEMENT, TRANSFEMORAL (N/A) TRANSESOPHAGEAL ECHOCARDIOGRAM (TEE) (N/A) as a surgical intervention.  The patient's history has been reviewed, patient examined, no change in status, stable for surgery.  I have reviewed the patient's chart and labs.  Questions were answered to the patient's satisfaction.     Gaye Pollack

## 2021-06-28 NOTE — Anesthesia Procedure Notes (Signed)
Procedure Name: MAC Date/Time: 06/28/2021 7:40 AM Performed by: Mariea Clonts, CRNA Pre-anesthesia Checklist: Patient identified, Emergency Drugs available, Suction available, Patient being monitored and Timeout performed Patient Re-evaluated:Patient Re-evaluated prior to induction Oxygen Delivery Method: Simple face mask Preoxygenation: Pre-oxygenation with 100% oxygen

## 2021-06-28 NOTE — Anesthesia Postprocedure Evaluation (Signed)
Anesthesia Post Note  Patient: [name]  Procedure(s) Performed: TRANSCATHETER AORTIC VALVE REPLACEMENT, TRANSFEMORAL USING A 26MM MEDTRONIC AORTIC VALVE. (Chest) TRANSESOPHAGEAL ECHOCARDIOGRAM (TEE) (Chest)     Patient location during evaluation: PACU Anesthesia Type: MAC Level of consciousness: awake and alert Pain management: pain level controlled Vital Signs Assessment: post-procedure vital signs reviewed and stable Respiratory status: spontaneous breathing, nonlabored ventilation, respiratory function stable and patient connected to nasal cannula oxygen Cardiovascular status: stable and blood pressure returned to baseline Postop Assessment: no apparent nausea or vomiting Anesthetic complications: no   No notable events documented.  Last Vitals:  Vitals:   06/28/21 1300 06/28/21 1400  BP: 129/66 137/82  Pulse: 66 69  Resp: 17 15  Temp:    SpO2: 100% 97%    Last Pain:  Vitals:   06/28/21 1337  TempSrc:   PainSc: 0-No pain                 Tiajuana Amass

## 2021-06-28 NOTE — Op Note (Signed)
[facility]   MULTIDISCIPLINARY HEART VALVE TEAM     TAVR OPERATIVE NOTE    [name] [name] [id number]  Date of Procedure:                 06/28/2021   Preoperative Diagnosis:      Severe Aortic Stenosis    Postoperative Diagnosis:    Same    Procedure:        Transcatheter Aortic Valve Replacement - Percutaneous Right Transfemoral Approach             Medtronic Evolut-Pro + (size 26 mm, model # EVPROPLUS-26US, serial # [id number])              Co-Surgeons:            Gaye Pollack, MD and Lauree Chandler, MD     Anesthesiologist:                  Suzette Battiest, MD   Echocardiographer:              Jenkins Rouge, MD   Pre-operative Echo Findings: Severe aortic stenosis   Normal left ventricular systolic function   Post-operative Echo Findings: No paravalvular leak Normal left ventricular systolic function Normal prosthetic transvalvular gradient of mean 4 mm Hg.     BRIEF CLINICAL NOTE AND INDICATIONS FOR SURGERY    Patient has stage D1 severe symptomatic aortic stenosis.  She describes chronic progression of symptoms of exertional shortness of breath and fatigue consistent with chronic diastolic congestive heart failure, New York Heart Association functional class II.   I have personally reviewed the patient's recent transthoracic echocardiogram, diagnostic cardiac catheterization, EKG, and CT angiograms.  Echocardiogram performed January 10, 2021 reveals normal left ventricular systolic function with ejection fraction estimated 65 to 70%.  The aortic valve is trileaflet with moderate to severe thickening and calcification involving all 3 leaflets.  Peak velocity across aortic valve measured 3.6 m/s corresponding to mean transvalvular gradient estimated 34 mmHg and aortic valve area calculated 0.94 cm by VTI.  Stroke-volume index was 38 and DVI reported 0.37.  Patient had concomitant moderate aortic insufficiency with aortic valve pressure half-time  reported 356 ms.  Of note, the aortic valve diameter is relatively small.  Diagnostic cardiac catheterization performed Feb 03, 2021 revealed nonobstructive coronary artery disease but confirmed the presence of severe aortic stenosis with mean transvalvular gradient measured at catheterization reported 58 mmHg corresponding to aortic valve area calculated only 0.50 cm.  EKG reveals sinus rhythm with no significant AV conduction delay.  Cardiac-gated CTA of the heart reveals anatomical characteristics consistent with aortic stenosis suitable for treatment by transcatheter aortic valve replacement without any significant complicating features other than the presence of a relatively small sized aortic root and aortic annulus. CTA of the aorta and iliac vessels demonstrate what appears to be adequate pelvic vascular access to facilitate a transfemoral approach.    The patient was counseled at length regarding treatment alternatives for management of severe aortic stenosis including continued medical therapy versus proceeding with aortic valve replacement in the near future.  The natural history of aortic stenosis was reviewed, as was long term prognosis with medical therapy alone.  Surgical options were discussed at length including conventional surgical aortic valve replacement through either a full median sternotomy or using minimally invasive techniques.  Other alternatives including transcatheter aortic valve replacement, conventional surgery with patch enlargement of the aortic root, stentless porcine aortic root replacement were  discussed.  The patient is not interested in conventional surgery and desires to proceed with transcatheter aortic valve replacement.             DETAILS OF THE OPERATIVE PROCEDURE   PREPARATION:     The patient is brought to the operating room on the above mentioned date and central monitoring was established by the anesthesia team including placement of a central venous line  and radial arterial line. The patient is placed in the supine position on the operating table.  Intravenous antibiotics are administered. General endotracheal anesthesia is induced uneventfully.  A Foley catheter is placed.   Baseline transesophageal echocardiogram was performed. The patient's chest, abdomen, both groins, and both lower extremities are prepared and draped in a sterile manner. A time out procedure is performed.     PERIPHERAL ACCESS:     Using the modified Seldinger technique, femoral arterial and venous access was obtained with placement of 6 Fr sheaths on the left side.  A pigtail diagnostic catheter was passed through the left arterial sheath under fluoroscopic guidance into the aortic root.     TRANSFEMORAL ACCESS:    Percutaneous transfemoral access and sheath placement was performed using ultrasound guidance.  The right common femoral artery was cannulated using a micropuncture needle.  A pair of Abbott Perclose percutaneous closure devices were placed and a 6 French sheath replaced into the femoral artery.  The patient was heparinized systemically and ACT verified > 250 seconds.     An AL-1 catheter was used to direct a J-wire across the native aortic valve into the left ventricle. This was exchanged out for a pigtail catheter and position was confirmed in the LV apex.  The pigtail catheter was exchanged for a Confida wire in the LV apex.  Pacing was performed through this wire using the skin as ground and a satisfactory pacing threshold was confirmed.    BALLOON AORTIC VALVULOPLASTY:    Not performed   TRANSCATHETER HEART VALVE DEPLOYMENT:    A Medtronic Evolut-Pro transcatheter heart valve (size 26 mm) was prepared and loaded into an EnVeo PRO delivery catheter system per manufacturer's guidelines and the proper orientation of the valve is confirmed under fluoroscopy. The delivery system and inline sheath were inserted into the right common femoral artery over the  Confida wire and the inline sheath advanced into the abdominal aorta under fluoroscopic guidance. The delivery catheter was advanced around the aortic arch and the valve was carefully positioned across the aortic valve annulus. An aortic root injection was performed to confirm position and the valve deployed using the cusp overlap technique under fluoroscopic guidance. Intermittent pacing was used during valve deployment. The delivery system and guidewire were retracted into the descending aorta and the nosecone re-sheathed. Valve function is assessed using echocardiography. There is felt to be no paravalvular leak and no central aortic insufficiency. The patient's hemodynamic recovery following valve deployment is good.        PROCEDURE COMPLETION:    The delivery system and in-line sheath were removed and femoral artery closure performed using the Perclose devices.  Protamine was administered once femoral arterial repair was complete. The temporary pacemaker, pigtail catheters and femoral sheaths were removed with manual pressure used for hemostasis and Mynx closure used for the left femoral artery.   The patient tolerated the procedure well and is transported to the cath lab recovery area in stable condition. There were no immediate intraoperative complications. All sponge instrument and needle counts are verified correct at  completion of the operation.    No blood products were administered during the operation.   The patient received a total of 80 mL of intravenous contrast during the procedure.     Gaye Pollack, MD

## 2021-06-28 NOTE — Progress Notes (Signed)
Mobility Specialist Progress Note:   06/28/21 1515  Therapy Vitals  Pulse Rate 66  BP 132/79  Patient Position (if appropriate) Lying  Oxygen Therapy  SpO2 100 %  Mobility  Activity Ambulated in hall;Ambulated to bathroom  Level of Assistance Contact guard assist, steadying assist  Assistive Device  (IV pole)  Distance Ambulated (ft) 330 ft  Mobility Ambulated with assistance in hallway  Mobility Response Tolerated well  Mobility performed by Mobility specialist  $Mobility charge 1 Mobility   Pre Mobility: HR 66bpm; BP 132/79; SpO2 100% During Mobility: HR 70 bpm Post Mobility: HR 68 bpm; BP 158/78; SpO2 100%  Pt received in bed and agreed to mobility. Stated she was starting to feel pain in groin incisions. Ambulated in hallway 334f with IV pole and contact G d/t slight limping. Voiced that she has chronic pain in L foot and back. Incisions are dry and clean. Pt left in bed with all needs met and family present.    [name] NumbersMobility Specialist  Phone [phone]

## 2021-06-28 NOTE — Anesthesia Procedure Notes (Signed)
Arterial Line Insertion Start/End10/12/2020 7:05 AM, 06/28/2021 7:15 AM Performed by: Suzette Battiest, MD, anesthesiologist  Patient location: Pre-op. Preanesthetic checklist: patient identified, IV checked, site marked, risks and benefits discussed, surgical consent, monitors and equipment checked, pre-op evaluation, timeout performed and anesthesia consent Lidocaine 1% used for infiltration Right, radial was placed Catheter size: 20 G Hand hygiene performed  and maximum sterile barriers used   Attempts: 1 Procedure performed without using ultrasound guided technique. Following insertion, dressing applied and Biopatch. Post procedure assessment: normal and unchanged  Patient tolerated the procedure well with no immediate complications.

## 2021-06-28 NOTE — Discharge Instructions (Signed)

## 2021-06-29 ENCOUNTER — Encounter (HOSPITAL_COMMUNITY): Payer: Medicare Other | Admitting: Dentistry

## 2021-06-29 ENCOUNTER — Inpatient Hospital Stay (HOSPITAL_COMMUNITY): Payer: Medicare Other

## 2021-06-29 ENCOUNTER — Telehealth: Payer: Self-pay | Admitting: Cardiovascular Disease

## 2021-06-29 ENCOUNTER — Encounter (HOSPITAL_COMMUNITY): Payer: Self-pay | Admitting: Cardiovascular Disease

## 2021-06-29 DIAGNOSIS — Z20822 Contact with and (suspected) exposure to covid-19: Secondary | ICD-10-CM | POA: Diagnosis not present

## 2021-06-29 DIAGNOSIS — I5032 Chronic diastolic (congestive) heart failure: Secondary | ICD-10-CM | POA: Diagnosis not present

## 2021-06-29 DIAGNOSIS — I350 Nonrheumatic aortic (valve) stenosis: Secondary | ICD-10-CM | POA: Diagnosis not present

## 2021-06-29 DIAGNOSIS — Z006 Encounter for examination for normal comparison and control in clinical research program: Secondary | ICD-10-CM | POA: Diagnosis not present

## 2021-06-29 DIAGNOSIS — Z952 Presence of prosthetic heart valve: Secondary | ICD-10-CM

## 2021-06-29 LAB — ECHOCARDIOGRAM COMPLETE
AR max vel: 2.35 cm2
AV Area VTI: 2.2 cm2
AV Area mean vel: 2.23 cm2
AV Mean grad: 8.4 mmHg
AV Peak grad: 16.1 mmHg
Ao pk vel: 2.01 m/s
Area-P 1/2: 3.6 cm2
Calc EF: 68 %
Height: 64 in
S' Lateral: 1.9 cm
Single Plane A2C EF: 80.3 %
Single Plane A4C EF: 65.5 %
Weight: 2441.6 oz

## 2021-06-29 LAB — CBC
HCT: 39 % (ref 36.0–46.0)
Hemoglobin: 13.4 g/dL (ref 12.0–15.0)
MCH: 32 pg (ref 26.0–34.0)
MCHC: 34.4 g/dL (ref 30.0–36.0)
MCV: 93.1 fL (ref 80.0–100.0)
Platelets: 214 10*3/uL (ref 150–400)
RBC: 4.19 MIL/uL (ref 3.87–5.11)
RDW: 12.8 % (ref 11.5–15.5)
WBC: 6.8 10*3/uL (ref 4.0–10.5)
nRBC: 0 % (ref 0.0–0.2)

## 2021-06-29 LAB — BASIC METABOLIC PANEL
Anion gap: 6 (ref 5–15)
BUN: 7 mg/dL — ABNORMAL LOW (ref 8–23)
CO2: 27 mmol/L (ref 22–32)
Calcium: 8.2 mg/dL — ABNORMAL LOW (ref 8.9–10.3)
Chloride: 103 mmol/L (ref 98–111)
Creatinine, Ser: 0.93 mg/dL (ref 0.44–1.00)
GFR, Estimated: >60 mL/min (ref >60)
Glucose, Bld: 97 mg/dL (ref 70–99)
Potassium: 3.5 mmol/L (ref 3.5–5.1)
Sodium: 136 mmol/L (ref 135–145)

## 2021-06-29 MED ORDER — CLOPIDOGREL BISULFATE 75 MG PO TABS: 75.0000 mg | ORAL_TABLET | Freq: Every day | ORAL | 1 refills | Status: DC

## 2021-06-29 MED ORDER — CLOPIDOGREL BISULFATE 75 MG PO TABS
75.0000 mg | ORAL_TABLET | Freq: Every day | ORAL | 1 refills | Status: DC
Start: 2021-06-30 — End: 2022-05-18

## 2021-06-29 NOTE — Telephone Encounter (Signed)
Patient states she had heart valve surgery and has a "jiggle" feeling in her chest on the side where her heart is. She states she is not having any pain, pressure, or tightness. She says she is not sure if it is from "where they put the thing in" her chest. She says it came and went 2-3 times in the last hour, but is not having any other symptoms.

## 2021-06-29 NOTE — Progress Notes (Signed)
Mobility Specialist Progress Note:   06/29/21 1035  Therapy Vitals  Pulse Rate 81  BP (!) 144/67  Patient Position (if appropriate) Lying  Mobility  Activity Ambulated in hall  Level of Assistance Contact guard assist, steadying assist  Distance Ambulated (ft) 330 ft  Mobility Ambulated with assistance in hallway  Mobility Response Tolerated well  Mobility performed by Mobility specialist  $Mobility charge 1 Mobility   Pre Mobility: HR 81 bpm; BP 144/67 During Mobility: HR 93 bpm Post Mobility: HR 83 bpm; BP 131/80  Pt received in bed agreeable to mobility. Voiced pain d/t headache. Ambulated in hallway 330' with no AD and contact G d/t slight LOBs. Tolerated well. Incisions are clean and dry. Pt left in bed with all needs met and family present.  Nelta Numbers Mobility Specialist  Phone [phone]

## 2021-06-29 NOTE — Progress Notes (Signed)
  Echocardiogram 2D Echocardiogram has been performed.  [name] [name] 06/29/2021, 11:44 AM

## 2021-06-29 NOTE — Progress Notes (Addendum)
CARDIAC REHAB PHASE I   Pt ambulated with mobility team. Educated pt and daughter on nutrition, exercise, and restrictions. Discussed CRPHII w/ pt. Will refer to [facility]. Will continue to follow.  [id number] Sheppard Plumber, MS, ACSM-CEP 06/29/2021 11:38 AM

## 2021-06-29 NOTE — Discharge Summary (Addendum)
[name] [name]  Discharge Summary    [name] ID: [name] [name] MRN: [id number]; DOB: September 24, 1956  Admit date: 06/28/2021 Discharge date: 06/29/2021  Primary Care Provider: Caren Macadam, MD  Primary Cardiologist: Dr. Lyman Bishop, MD/ Dr. Lauree Chandler and Dr. Gilford Raid, MD (TAVR)  Discharge Diagnoses    Principal Problem:   S/P TAVR (transcatheter aortic valve replacement) Active Problems:   DEPRESSION/ANXIETY   Hyperlipidemia   Severe aortic stenosis  Allergies Allergies  Allergen Reactions   Ciprofloxacin Itching and Palpitations    migraine   Macrobid [Nitrofurantoin Macrocrystal] Other (See Comments)    Body aches and Migraine headache   Ampicillin Other (See Comments)    Told by md not to take ampilciian due to penicillin allergy   Atorvastatin Other (See Comments)    Felt like had flu   Buspirone Hcl Other (See Comments)    REACTION: intolerance   Ezetimibe-Simvastatin Other (See Comments)    Unknown   Latex Other (See Comments)    Redness and burning   Lyrica [Pregabalin] Other (See Comments)    Caused swelling in legs and hands   Morphine And Related Itching    Not be a nice person   Penicillins Other (See Comments)    Fever and itching   Sulfa Antibiotics Nausea Only    Tolerate sometimes    Diagnostic Studies/Procedures    TAVR OPERATIVE NOTE     Date of Procedure:                06/28/2021   Preoperative Diagnosis:      Severe Aortic Stenosis    Postoperative Diagnosis:    Same    Procedure:        Transcatheter Aortic Valve Replacement - Transfemoral Approach             Medtronic Evolut Pro THV (size 26 mm, model # EVPROPLUS26US, serial # [id number])              Co-Surgeons:                        Lauree Chandler, MD and Gaye Pollack, MD    Anesthesiologist:                  Ola Spurr   Echocardiographer:              Johnsie Cancel   Pre-operative Echo Findings: Severe  aortic stenosis Normal left ventricular systolic function   Post-operative Echo Findings: No paravalvular leak Normal left ventricular systolic function _____________   Echo 06/29/21: Completed however pending formal read at the time of discharge   History of Present Illness     [name] [name] is a 65 y.o. female with a history of chronic back pain, depression/anxiety, fibromyalgia, GERD, hyperlipidemia and severe aortic stenosis who presented to [facility] on 06/28/21 for planned TAVR.   [name] [name] was referred to Dr. Debara Pickett for evaluation of heart 06/2020. Echocardiogram performed 04/2020 which revealed normal left ventricular systolic function with moderate to severe aortic stenosis and mild to moderate aortic insufficiency. Follow-up echocardiogram performed 12/2020 showed findings consistent with severe aortic stenosis therefore [name] was referred to the multidisciplinary heart valve clinic for management. She was seen by Dr. Angelena Form and underwent diagnostic cardiac catheterization on 02/03/2021 that showed nonobstructive coronary artery disease but confirmed the presence of severe aortic stenosis. She underwent extraction of 6 teeth by Dr. Benson Norway on  05/26/2021.   [name] has been evaluated by the multidisciplinary valve [name] and felt to have severe, symptomatic aortic stenosis and to be a suitable candidate for TAVR, which was set up for 06/28/21.     Hospital Course   Severe AS: s/p successful TAVR with a 26 mm EnVeo PRO via the TF approach on 06/28/21. Post operative echo pending. Groin sites are stable. ECG with NSR and no high grade heart block. She was started Plavix for post TAVR antiplatelet regimen. BP slightly elevated today. Plan to follow in the OP setting as this has otherwise been WNL. Post procedure labs stable with creatinine at 0.93 and Hb 13.4. She will be seen by cardiac rehabilitation prior to discharge. Post TAVR care instructions given. She will be seen in one week with  structural heart APP and one month with echocardiogram prior.   Will discuss the need for lifelong SBE at her one week follow up.     Consultants: None    [name] has been seen and examined by Dr. Angelena Form who feel that she is stable and ready for discharge today, 06/29/21  _____________  Discharge Vitals Blood pressure (!) 144/81, pulse 81, temperature 98.3 F (36.8 C), temperature source Oral, resp. rate 10, height 5\' 4"  (1.626 m), weight 69.2 kg, SpO2 94 %.  Filed Weights   06/28/21 0551 06/29/21 0417  Weight: 69.4 kg 69.2 kg   General: Well developed, well nourished, NAD Lungs:Clear to ausculation bilaterally. Breathing is unlabored. Cardiovascular: RRR with S1 S2. + soft  murmur Abdomen: Soft, non-tender, non-distended. No obvious abdominal masses. Extremities: No edema. Neuro: Alert and oriented. No focal deficits. No facial asymmetry. MAE spontaneously. Psych: Responds to questions appropriately with normal affect.    Labs & Radiologic Studies    CBC Recent Labs    06/28/21 0955 06/29/21 0104  WBC  --  6.8  HGB 12.2 13.4  HCT 36.0 39.0  MCV  --  93.1  PLT  --  762   Basic Metabolic Panel Recent Labs    06/28/21 0955 06/29/21 0104  NA 140 136  K 3.4* 3.5  CL 103 103  CO2  --  27  GLUCOSE 141* 97  BUN 7* 7*  CREATININE 0.90 0.93  CALCIUM  --  8.2*   Liver Function Tests No results for input(s): AST, ALT, ALKPHOS, BILITOT, PROT, ALBUMIN in the last 72 hours. No results for input(s): LIPASE, AMYLASE in the last 72 hours. Cardiac Enzymes No results for input(s): CKTOTAL, CKMB, CKMBINDEX, TROPONINI in the last 72 hours. BNP Invalid input(s): POCBNP D-Dimer No results for input(s): DDIMER in the last 72 hours. Hemoglobin A1C No results for input(s): HGBA1C in the last 72 hours. Fasting Lipid Panel No results for input(s): CHOL, HDL, LDLCALC, TRIG, CHOLHDL, LDLDIRECT in the last 72 hours. Thyroid Function Tests No results for input(s): TSH, T4TOTAL,  T3FREE, THYROIDAB in the last 72 hours.  Invalid input(s): FREET3 _____________  DG Chest 2 View  Result Date: 06/25/2021 CLINICAL DATA:  Preop TAVR. EXAM: CHEST - 2 VIEW COMPARISON:  03/22/2020 FINDINGS: Lungs are adequately inflated and otherwise clear. Cardiomediastinal silhouette and remainder of the exam is unchanged. Neurostimulator device unchanged. IMPRESSION: No active cardiopulmonary disease. Electronically Signed   By: Marin Olp M.D.   On: 06/25/2021 14:30   ECHOCARDIOGRAM LIMITED  Result Date: 06/28/2021    ECHOCARDIOGRAM LIMITED REPORT   [name] Name:   KENYANA [name] Date of Exam: 06/28/2021 Medical Rec #:  [id number]  Height:       64.0 in Accession #:    [id number]      Weight:       153.0 lb Date of Birth:  04-Jun-1956       BSA:          1.746 m [name] Age:    65 years        BP:           157/61 mmHg [name] Gender: F               HR:           81 bpm. Exam Location:  Inpatient Procedure: Limited Echo, Color Doppler and Cardiac Doppler Indications:    Aortic stenosis I35.0  History:        [name] has prior history of Echocardiogram examinations, most                 recent 01/10/2021. Stroke, Aortic Valve Disease,                 Signs/Symptoms:Fatigue; Risk Factors:Dyslipidemia and                 Hypertension.                 Aortic Valve: 26 mm CoreValve-Evolut Pro prosthetic, stented                 (TAVR) valve is present in the aortic position. Procedure Date:                 06/28/2021.  Sonographer:    Darlina Sicilian RDCS Referring Phys: [place]  1. Left atrial size was mildly dilated.  2. The mitral valve is abnormal. Trivial mitral valve regurgitation.  3. Pre TAVR: supine in OR severe AS calcified tri leaflet vale mean gradient 43 peak 67 mmHg AVA 0.55 cm2         Post TAVR : well placed 26 mm Evolut Pro No PVL mean gradient 4 peak 9 mmHg AVA 1.8 cm2 . Aortic valve regurgitation is mild to moderate. There is a 26 mm CoreValve-Evolut Pro  prosthetic (TAVR) valve present in the aortic position. Procedure Date: 06/28/2021. FINDINGS  Left [facility]: Left atrial size was mildly dilated. Pericardium: There is no evidence of pericardial effusion. Mitral Valve: The mitral valve is abnormal. There is mild thickening of the mitral valve leaflet(s). There is mild calcification of the mitral valve leaflet(s). Mild mitral annular calcification. Trivial mitral valve regurgitation. Aortic Valve: Pre TAVR: supine in OR severe AS calcified tri leaflet vale mean gradient 43 peak 67 mmHg AVA 0.55 cm2 Post TAVR : well placed 26 mm Evolut Pro No PVL mean gradient 4 peak 9 mmHg AVA 1.8 cm2. Aortic valve regurgitation is mild to moderate. Aortic valve mean gradient measures 4.0 mmHg. Aortic valve peak gradient measures 9.2 mmHg. Aortic valve area, by VTI  measures 2.39 cm. There is a 26 mm CoreValve-Evolut Pro prosthetic, stented (TAVR) valve present in the aortic position. Procedure Date: 06/28/2021. LEFT VENTRICLE PLAX 2D LVOT diam:     1.70 cm LV SV:         53 LV SV Index:   30 LVOT Area:     2.27 cm  AORTIC VALVE AV Area (Vmax):    1.82 cm AV Area (Vmean):   2.37 cm AV Area (VTI):     2.39 cm AV Vmax:  152.00 cm/s AV Vmean:          93.200 cm/s AV VTI:            0.221 m AV Peak Grad:      9.2 mmHg AV Mean Grad:      4.0 mmHg LVOT Vmax:         122.00 cm/s LVOT Vmean:        97.500 cm/s LVOT VTI:          0.233 m LVOT/AV VTI ratio: 1.05  SHUNTS Systemic VTI:  0.23 m Systemic Diam: 1.70 cm Jenkins Rouge MD Electronically signed by Jenkins Rouge MD Signature Date/Time: 06/28/2021/9:48:56 AM    Final    Structural Heart Procedure  Result Date: 06/28/2021 See surgical note for result.  Disposition   Pt is being discharged home today in good condition.  Follow-up Plans & Appointments    Follow-up Information     Eileen Stanford, PA-C. Go on 07/06/2021.   Specialties: Cardiology, Radiology Why: @ 2:30pm, please arrive at least 10 minutes  early. Contact information: [address] [place] [address] [phone]                Discharge Medications   Allergies as of 06/29/2021       Reactions   Ciprofloxacin Itching, Palpitations   migraine   Macrobid [nitrofurantoin Macrocrystal] Other (See Comments)   Body aches and Migraine headache   Ampicillin Other (See Comments)   Told by md not to take ampilciian due to penicillin allergy   Atorvastatin Other (See Comments)   Felt like had flu   Buspirone Hcl Other (See Comments)   REACTION: intolerance   Ezetimibe-simvastatin Other (See Comments)   Unknown   Latex Other (See Comments)   Redness and burning   Lyrica [pregabalin] Other (See Comments)   Caused swelling in legs and hands   Morphine And Related Itching   Not be a nice person   Penicillins Other (See Comments)   Fever and itching   Sulfa Antibiotics Nausea Only   Tolerate sometimes        Medication List     STOP taking these medications    metoprolol tartrate 100 MG tablet Commonly known as: LOPRESSOR       TAKE these medications    acyclovir ointment 5 % Commonly known as: ZOVIRAX Apply 1 application topically every 4 (four) hours as needed. What changed: reasons to take this   ALPRAZolam 1 MG tablet Commonly known as: XANAX Take 0.5-1 mg by mouth at bedtime as needed for sleep.   amphetamine-dextroamphetamine 30 MG tablet Commonly known as: ADDERALL Take 30 mg by mouth daily.   clopidogrel 75 MG tablet Commonly known as: PLAVIX Take 1 tablet (75 mg total) by mouth daily with breakfast. Start taking on: June 30, 2021   cyclobenzaprine 10 MG tablet Commonly known as: FLEXERIL Take 10 mg by mouth 3 (three) times daily as needed for muscle spasms.   diclofenac Sodium 1 % Gel Commonly known as: VOLTAREN Apply 2 g topically 3 (three) times daily as needed (pain).   DRY EYES OP Place 1 drop into both eyes daily as needed (Dry eye).   gabapentin  300 MG capsule Commonly known as: NEURONTIN Take 300 mg by mouth 4 (four) times daily.   Heating Pad Pads Use 3 times weekly What changed:  how much to take how to take this when to take this additional instructions   hydrOXYzine 25  MG tablet Commonly known as: ATARAX/VISTARIL Take 25 mg by mouth 2 (two) times daily as needed for itching.   ibuprofen 200 MG tablet Commonly known as: ADVIL Take 400-800 mg by mouth every 6 (six) hours as needed for moderate pain (pain).   ondansetron 4 MG disintegrating tablet Commonly known as: ZOFRAN-ODT DISSOLVE 1 TABLET BY MOUTH EVERY 8 HOURS AS NEEDED FOR NAUSEA   Oxycodone HCl 10 MG Tabs Take 10 mg by mouth 4 (four) times daily as needed (pain).   promethazine 25 MG suppository Commonly known as: PHENERGAN INSERT 1 SUPPOSITORY RECTALLY EVERY 8 HOURS AS NEEDED.   rosuvastatin 20 MG tablet Commonly known as: CRESTOR TAKE (1) TABLET BY MOUTH ONCE DAILY.   STOOL SOFTENER PO Take 1 tablet by mouth daily as needed (constipation).   Viibryd 40 MG Tabs Generic drug: Vilazodone HCl TAKE (1) TABLET BY MOUTH ONCE DAILY WITH FULL MEAL. What changed: See the new instructions.       Outstanding Labs/Studies   None   Duration of Discharge Encounter   Greater than 30 minutes including physician time.  SignedKathyrn Drown, NP 06/29/2021, 9:40 AM [phone]   I have personally seen and examined this [name]. I agree with the assessment and plan as outlined above.  Doing well today post TAVR. Groins stable. BP stable. Sinus. No high grade AV block or new BBB. Echo pending. If echo ok, will d/c home today.   Lauree Chandler 06/29/2021 10:01 AM

## 2021-06-29 NOTE — Telephone Encounter (Signed)
Feels some movement in her chest over heart that she's not felt before.  She called it a jiggle or quiver.  She feels fine otherwise.  She just got home.  It happened 2-3 times starting on the way home.  She was worried that she did something wrong.  I adv her to get settled in at her daughter's house and relax and that she will be receiving a call to check on her tomorrow.

## 2021-06-30 NOTE — Telephone Encounter (Signed)
  [place] VALVE TEAM   Patient contacted regarding discharge from [facility] on 10/5  Patient understands to follow up with provider Nell Range on 10/12 at [facility].  Patient understands discharge instructions? yes Patient understands medications and regimen? yes Patient understands to bring all medications to this visit? Yes   Felt a little anxious yesterday but now feeling better.   Angelena Form PA-C  [facility]

## 2021-07-01 ENCOUNTER — Encounter (HOSPITAL_COMMUNITY): Payer: Self-pay | Admitting: Cardiovascular Disease

## 2021-07-01 ENCOUNTER — Telehealth (HOSPITAL_COMMUNITY): Payer: Self-pay

## 2021-07-01 NOTE — Telephone Encounter (Signed)
Per phase I cardiac rehab, fax cardiac rehab referral to [facility] cardiac rehab.

## 2021-07-06 ENCOUNTER — Encounter: Payer: Self-pay | Admitting: Cardiology

## 2021-07-06 ENCOUNTER — Ambulatory Visit: Payer: Medicare Other | Admitting: Physician Assistant

## 2021-07-06 ENCOUNTER — Encounter (HOSPITAL_COMMUNITY): Payer: Medicare Other | Admitting: Dentistry

## 2021-07-06 ENCOUNTER — Other Ambulatory Visit: Payer: Self-pay

## 2021-07-06 ENCOUNTER — Ambulatory Visit (INDEPENDENT_AMBULATORY_CARE_PROVIDER_SITE_OTHER): Payer: Medicare Other | Admitting: Cardiology

## 2021-07-06 VITALS — BP 122/86 | HR 75 | Ht 63.5 in | Wt 153.4 lb

## 2021-07-06 DIAGNOSIS — Z952 Presence of prosthetic heart valve: Secondary | ICD-10-CM

## 2021-07-06 DIAGNOSIS — I350 Nonrheumatic aortic (valve) stenosis: Secondary | ICD-10-CM | POA: Diagnosis not present

## 2021-07-06 DIAGNOSIS — I10 Essential (primary) hypertension: Secondary | ICD-10-CM

## 2021-07-06 DIAGNOSIS — Z79899 Other long term (current) drug therapy: Secondary | ICD-10-CM

## 2021-07-06 DIAGNOSIS — E785 Hyperlipidemia, unspecified: Secondary | ICD-10-CM

## 2021-07-06 MED ORDER — AZITHROMYCIN 500 MG PO TABS: 500.0000 mg | ORAL_TABLET | ORAL | 12 refills | Status: DC

## 2021-07-06 MED FILL — Magnesium Sulfate Inj 50%: INTRAMUSCULAR | Qty: 10 | Status: AC

## 2021-07-06 MED FILL — Potassium Chloride Inj 2 mEq/ML: INTRAVENOUS | Qty: 40 | Status: AC

## 2021-07-06 MED FILL — Heparin Sodium (Porcine) Inj 1000 Unit/ML: Qty: 1000 | Status: AC

## 2021-07-06 NOTE — Patient Instructions (Addendum)
Medication Instructions:  We have sent in Azithromycin 500 mg, take 1 tablet by mouth 1 hour prior to dental work   *If you need a refill on your cardiac medications before your next appointment, please call your pharmacy*   Lab Work: BMET, CBC, PRO-BNP - Today   If you have labs (blood work) drawn today and your tests are completely normal, you will receive your results only by: [place] (if you have MyChart) OR A paper copy in the mail If you have any lab test that is abnormal or we need to change your treatment, we will call you to review the results.   Testing/Procedures: None ordered    Follow-Up: Follow up as scheduled    Other Instructions None
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
## 2021-07-07 LAB — CBC
Hematocrit: 44 % (ref 34.0–46.6)
Hemoglobin: 15 g/dL (ref 11.1–15.9)
MCH: 31.3 pg (ref 26.6–33.0)
MCHC: 34.1 g/dL (ref 31.5–35.7)
MCV: 92 fL (ref 79–97)
Platelets: 355 10*3/uL (ref 150–450)
RBC: 4.79 x10E6/uL (ref 3.77–5.28)
RDW: 12.2 % (ref 11.7–15.4)
WBC: 7.2 10*3/uL (ref 3.4–10.8)

## 2021-07-07 LAB — BASIC METABOLIC PANEL
BUN/Creatinine Ratio: 13 (ref 12–28)
BUN: 13 mg/dL (ref 8–27)
CO2: 26 mmol/L (ref 20–29)
Calcium: 9.7 mg/dL (ref 8.7–10.3)
Chloride: 99 mmol/L (ref 96–106)
Creatinine, Ser: 1.02 mg/dL — ABNORMAL HIGH (ref 0.57–1.00)
Glucose: 84 mg/dL (ref 70–99)
Potassium: 4.7 mmol/L (ref 3.5–5.2)
Sodium: 137 mmol/L (ref 134–144)
eGFR: 61 mL/min/{1.73_m2} (ref >59)

## 2021-07-07 LAB — PRO B NATRIURETIC PEPTIDE: NT-Pro BNP: 53 pg/mL (ref 0–301)

## 2021-07-13 ENCOUNTER — Other Ambulatory Visit: Payer: Self-pay

## 2021-07-13 ENCOUNTER — Encounter: Payer: Self-pay | Admitting: Family Medicine

## 2021-07-13 ENCOUNTER — Telehealth (INDEPENDENT_AMBULATORY_CARE_PROVIDER_SITE_OTHER): Payer: Medicare Other | Admitting: Family Medicine

## 2021-07-13 VITALS — BP 144/91

## 2021-07-13 DIAGNOSIS — R413 Other amnesia: Secondary | ICD-10-CM | POA: Diagnosis not present

## 2021-07-13 DIAGNOSIS — E785 Hyperlipidemia, unspecified: Secondary | ICD-10-CM | POA: Diagnosis not present

## 2021-07-13 DIAGNOSIS — E559 Vitamin D deficiency, unspecified: Secondary | ICD-10-CM | POA: Diagnosis not present

## 2021-07-13 DIAGNOSIS — F341 Dysthymic disorder: Secondary | ICD-10-CM

## 2021-07-13 DIAGNOSIS — R5383 Other fatigue: Secondary | ICD-10-CM

## 2021-07-13 DIAGNOSIS — E538 Deficiency of other specified B group vitamins: Secondary | ICD-10-CM | POA: Diagnosis not present

## 2021-07-13 DIAGNOSIS — G894 Chronic pain syndrome: Secondary | ICD-10-CM

## 2021-07-13 NOTE — Progress Notes (Signed)
Virtual Visit via Telephone Note  I connected with [name] [name] on 07/13/21 at  1:30 PM EDT by telephone and verified that I am speaking with the correct person using two identifiers.   I discussed the limitations, risks, security and privacy concerns of performing an evaluation and management service by telephone and the availability of in person appointments. I also discussed with the patient that there may be a patient responsible charge related to this service. The patient expressed understanding and agreed to proceed.  Location patient: home Location provider:  [facility]  [place], [place] [address]  Participants present for the call: patient, provider Patient did not have a visit in the prior 7 days to address this/these issue(s).   History of Present Illness:  "I've had better days". Pain doc suggested she talk to PCP about this issue. Talked with doc in 2015 about memory problems and had test. Told her at that time he didn't see any problems. She sees a lot of problems now. She told boyfriend that if she didn't note improvement in memory after valve was replaced she would investigate options of treatment for her.   Boyfriend agrees that she has a problem. Forgets why she walks into room. It happens daily and more than once a day. A lot of times never remembers what she went into room for. Can't remember peoples names very well at all unless she is seeing them daily or weekly basis for a long time; otherwise she forgets. Can remember her number and boyfriend's, but no others. Never remembers her passwords; has to write everything down. Keeps three books of passwords with her. Trying to remember doc appointments has big board - dry erase - that she puts appointments on in diff colors to manage her visits. Never been able to spell right; doesn't worry about that. Getting lost more and more - has to rely on maps on telephone. Never remembered road name, but  could remember land marks and now can't. Daughter will states she notes a difference.   Started having headaches before the surgery. Now gets them when her bp is elevated. Was getting them 2-3 times/week. Took advil which helped.   Has some post nasal drainage - doesn't come up when she tries to clear this. Fairly constant. Has had this for years.  She feels that mood is stable on the viibryd. Had to start putting meds in pill container x 2 years so she doesn't forget.   She takes muscle relaxer (flexeril) only when she needs it - this is when she is getting a lot of cramps.   Restarted her statin regularly since heart surgery- decreased it due to worries about it affecting memory.   Does take pain medication regularly and needs this before she can get out of bed. Cold is worse for her. Lives in house heated by wood.   She is afraid to live on her own. Just never lived alone.   she did get hearing aids; hasn't worn them since being out of hospital.   Stays tired, but feels this is related to her pain.   She isn't able to complete all tasks she would like to do. Ie needs to do things in steps - like canning/food prep. Tires more easily.   Psychiatrist did approve xanax at night if needed when she is under more stress; to help with sleeping. She also had adderall restarted.   Observations/Objective: Patient sounds worried on the phone. I  do not appreciate any SOB. Speech and thought processing are grossly intact. Patient reported vitals:  Assessment and Plan:  1. Memory loss Will start with bloodwork, then have her come in office to complete montreal cognitive assessment. If abnormal; can start treatment/refer to neurology. Discussed consideration for neurocog testing as well.   2. Fatigue, unspecified type - TSH; Future  3. DEPRESSION/ANXIETY Feels that mood is stable.  She does see psychiatry.  She is on Viibryd.  She does not feel that mood is affecting her memory.  We  discussed how mood can masquerade as memory loss, but she does not feel that this is the issue.  4. Hyperlipidemia, unspecified hyperlipidemia type Continue with Crestor 20 mg daily. - Lipid panel; Future - Comprehensive metabolic panel; Future  5. Chronic pain syndrome Follows regularly with pain management.  6. Low serum vitamin B12 - Vitamin B12; Future - Homocysteine; Future - Methylmalonic acid, serum; Future - Folate; Future  7. Vitamin D deficiency - VITAMIN D 25 Hydroxy (Vit-D Deficiency, Fractures); Future   Follow Up Instructions:  Return for pending bloodwork.    [id number] 5-10 [id number] 11-20 9443 21-30 I did not refer this patient for an OV in the next 24 hours for this/these issue(s).  I discussed the assessment and treatment plan with the patient. The patient was provided an opportunity to ask questions and all were answered. The patient agreed with the plan and demonstrated an understanding of the instructions.   The patient was advised to call back or seek an in-person evaluation if the symptoms worsen or if the condition fails to improve as anticipated.  I provided 30 minutes of non-face-to-face time during this encounter.   Micheline Rough, MD

## 2021-07-14 ENCOUNTER — Telehealth: Payer: Self-pay | Admitting: *Deleted

## 2021-07-14 NOTE — Telephone Encounter (Signed)
Spoke with the patient and scheduled appts as below.

## 2021-07-14 NOTE — Telephone Encounter (Signed)
-----   Message from Caren Macadam, MD sent at 07/13/2021  2:44 PM EDT ----- Please set up lab visit followed by in office visit.

## 2021-07-15 ENCOUNTER — Encounter (HOSPITAL_COMMUNITY): Payer: Medicare Other | Admitting: Dentistry

## 2021-07-18 ENCOUNTER — Telehealth: Payer: Self-pay | Admitting: Family Medicine

## 2021-07-18 DIAGNOSIS — W57XXXA Bitten or stung by nonvenomous insect and other nonvenomous arthropods, initial encounter: Secondary | ICD-10-CM

## 2021-07-18 NOTE — Telephone Encounter (Signed)
Pt is calling and would like to know if md could add to her lab order pt is sch for lab work this thursday 07-21-2021 . Pt would like to  check for rheumatoid arthritis due to  she  is having joint pain and also she had removed at least 10 ticks from her body this summer  and was bitten by mosquitoes. Pt would like a blood test for that as well. Please advise
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
## 2021-07-20 ENCOUNTER — Other Ambulatory Visit: Payer: Self-pay | Admitting: Family Medicine

## 2021-07-20 NOTE — Telephone Encounter (Signed)
She did have testing for rheumatoid arthritis last year, this was normal (ANA, rheumatoid factor, and sed rate all normal). If joint pain has worsened, we could consider ordering again or having her see rheumatology. We can test for lyme antibody titers if desired (please order under dx of tick bite); patient will need to tell lab that orders are from 2 different days to ensure she gets everything from today as well as those labs previously ordered. Unless acutely ill, there is not mosquito illness that I would test for.

## 2021-07-20 NOTE — Telephone Encounter (Signed)
Patient informed of the message below. Patient is aware orders were entered for lyme test as below.

## 2021-07-21 ENCOUNTER — Other Ambulatory Visit (INDEPENDENT_AMBULATORY_CARE_PROVIDER_SITE_OTHER): Payer: Medicare Other

## 2021-07-21 ENCOUNTER — Other Ambulatory Visit: Payer: Self-pay

## 2021-07-21 ENCOUNTER — Ambulatory Visit: Payer: Medicare Other | Admitting: Physician Assistant

## 2021-07-21 ENCOUNTER — Ambulatory Visit (INDEPENDENT_AMBULATORY_CARE_PROVIDER_SITE_OTHER): Payer: Medicare Other | Admitting: Cardiology

## 2021-07-21 ENCOUNTER — Encounter: Payer: Self-pay | Admitting: Cardiology

## 2021-07-21 ENCOUNTER — Ambulatory Visit (HOSPITAL_COMMUNITY): Payer: Medicare Other | Attending: Internal Medicine

## 2021-07-21 VITALS — BP 118/70 | HR 81 | Ht 63.5 in | Wt 154.0 lb

## 2021-07-21 DIAGNOSIS — Z952 Presence of prosthetic heart valve: Secondary | ICD-10-CM | POA: Insufficient documentation

## 2021-07-21 DIAGNOSIS — G894 Chronic pain syndrome: Secondary | ICD-10-CM | POA: Diagnosis not present

## 2021-07-21 DIAGNOSIS — I350 Nonrheumatic aortic (valve) stenosis: Secondary | ICD-10-CM

## 2021-07-21 DIAGNOSIS — I10 Essential (primary) hypertension: Secondary | ICD-10-CM | POA: Diagnosis not present

## 2021-07-21 DIAGNOSIS — R0609 Other forms of dyspnea: Secondary | ICD-10-CM | POA: Diagnosis not present

## 2021-07-21 DIAGNOSIS — E785 Hyperlipidemia, unspecified: Secondary | ICD-10-CM | POA: Diagnosis not present

## 2021-07-21 DIAGNOSIS — E538 Deficiency of other specified B group vitamins: Secondary | ICD-10-CM

## 2021-07-21 DIAGNOSIS — R5383 Other fatigue: Secondary | ICD-10-CM

## 2021-07-21 DIAGNOSIS — W57XXXA Bitten or stung by nonvenomous insect and other nonvenomous arthropods, initial encounter: Secondary | ICD-10-CM

## 2021-07-21 DIAGNOSIS — M5416 Radiculopathy, lumbar region: Secondary | ICD-10-CM | POA: Diagnosis not present

## 2021-07-21 DIAGNOSIS — E559 Vitamin D deficiency, unspecified: Secondary | ICD-10-CM | POA: Diagnosis not present

## 2021-07-21 LAB — COMPREHENSIVE METABOLIC PANEL
ALT: 19 U/L (ref 0–35)
AST: 27 U/L (ref 0–37)
Albumin: 4.6 g/dL (ref 3.5–5.2)
Alkaline Phosphatase: 115 U/L (ref 39–117)
BUN: 11 mg/dL (ref 6–23)
CO2: 30 mEq/L (ref 19–32)
Calcium: 9.8 mg/dL (ref 8.4–10.5)
Chloride: 100 mEq/L (ref 96–112)
Creatinine, Ser: 1 mg/dL (ref 0.40–1.20)
GFR: 59.12 mL/min — ABNORMAL LOW (ref >60.00)
Glucose, Bld: 91 mg/dL (ref 70–99)
Potassium: 3.7 mEq/L (ref 3.5–5.1)
Sodium: 137 mEq/L (ref 135–145)
Total Bilirubin: 0.8 mg/dL (ref 0.2–1.2)
Total Protein: 7.8 g/dL (ref 6.0–8.3)

## 2021-07-21 LAB — ECHOCARDIOGRAM COMPLETE
AR max vel: 2.96 cm2
AV Area VTI: 3.31 cm2
AV Area mean vel: 3.22 cm2
AV Mean grad: 9 mmHg
AV Peak grad: 18 mmHg
Ao pk vel: 2.12 m/s
Area-P 1/2: 2.56 cm2
S' Lateral: 2.4 cm

## 2021-07-21 LAB — LIPID PANEL
Cholesterol: 200 mg/dL (ref 0–200)
HDL: 59 mg/dL (ref >39.00)
NonHDL: 141.27
Total CHOL/HDL Ratio: 3
Triglycerides: 214 mg/dL — ABNORMAL HIGH (ref 0.0–149.0)
VLDL: 42.8 mg/dL — ABNORMAL HIGH (ref 0.0–40.0)

## 2021-07-21 LAB — VITAMIN B12: Vitamin B-12: 206 pg/mL — ABNORMAL LOW (ref 211–911)

## 2021-07-21 LAB — VITAMIN D 25 HYDROXY (VIT D DEFICIENCY, FRACTURES): VITD: 36.4 ng/mL (ref 30.00–100.00)

## 2021-07-21 LAB — LDL CHOLESTEROL, DIRECT: Direct LDL: 112 mg/dL

## 2021-07-21 LAB — FOLATE: Folate: 11.1 ng/mL (ref >5.9)

## 2021-07-21 LAB — TSH: TSH: 3.94 u[IU]/mL (ref 0.35–5.50)

## 2021-07-21 NOTE — Patient Instructions (Signed)
Medication Instructions:  Your physician has recommended you make the following change in your medication:  STOP PLAVIX IN April 2023 START ASA 81 MG IN WHEN YOU STOP PLAVIX *If you need a refill on your cardiac medications before your next appointment, please call your pharmacy*   Lab Work: NONE If you have labs (blood work) drawn today and your tests are completely normal, you will receive your results only by: [place] (if you have MyChart) OR A paper copy in the mail If you have any lab test that is abnormal or we need to change your treatment, we will call you to review the results.   Testing/Procedures: NONE   Follow-Up: At [facility], you and your health needs are our priority.  As part of our continuing mission to provide you with exceptional heart care, we have created designated Provider Care Teams.  These Care Teams include your primary Cardiologist (physician) and Advanced Practice Providers (APPs -  Physician Assistants and Nurse Practitioners) who all work together to provide you with the care you need, when you need it.  We recommend signing up for the patient portal called "MyChart".  Sign up information is provided on this After Visit Summary.  MyChart is used to connect with patients for Virtual Visits (Telemedicine).  Patients are able to view lab/test results, encounter notes, upcoming appointments, etc.  Non-urgent messages can be sent to your provider as well.   To learn more about what you can do with MyChart, go to [email or web address].    Your next appointment:   6 MONTHS   The format for your next appointment:   In Person  Provider:   K. Mali Hilty, MD

## 2021-07-22 LAB — B. BURGDORFI ANTIBODIES: B burgdorferi Ab IgG+IgM: <0.9 index

## 2021-07-25 LAB — METHYLMALONIC ACID, SERUM: Methylmalonic Acid, Quant: 210 nmol/L (ref 87–318)

## 2021-07-25 LAB — HOMOCYSTEINE: Homocysteine: 13.2 umol/L — ABNORMAL HIGH (ref <10.4)

## 2021-07-28 ENCOUNTER — Telehealth (HOSPITAL_COMMUNITY): Payer: Self-pay

## 2021-07-28 NOTE — Telephone Encounter (Signed)
I spoke to patient on 07-27-21. I explained to patient that her appt on 08-03-21, may not be covered by her insurance company due to it being Post Heart Valve surgery and not medically necessary. Patient stated that she would call her insurance and find out if it will be covered. Patient called me back today and stated that the lady that she talked too said it probably would be covered, but that no one had submitted a prior approval. Patient then stated that, " its ok, she would just cancel her appt and find someone who could do all of her restorative work and make her dentures." I voiced understanding and told her to let me know if she needed anything from [place] in the future. I did recommend the [facility] that is in [place]. Patient told me she would call them to see if they could see her.

## 2021-08-03 ENCOUNTER — Ambulatory Visit: Payer: Medicare Other | Admitting: Family Medicine

## 2021-08-03 ENCOUNTER — Encounter (HOSPITAL_COMMUNITY): Payer: Medicare Other | Admitting: Dentistry

## 2021-08-12 ENCOUNTER — Ambulatory Visit (INDEPENDENT_AMBULATORY_CARE_PROVIDER_SITE_OTHER): Payer: Medicare Other | Admitting: Family Medicine

## 2021-08-12 ENCOUNTER — Encounter: Payer: Self-pay | Admitting: Family Medicine

## 2021-08-12 VITALS — BP 130/64 | HR 88 | Temp 97.7°F | Ht 63.5 in | Wt 155.4 lb

## 2021-08-12 DIAGNOSIS — F341 Dysthymic disorder: Secondary | ICD-10-CM

## 2021-08-12 DIAGNOSIS — R413 Other amnesia: Secondary | ICD-10-CM

## 2021-08-12 DIAGNOSIS — Z23 Encounter for immunization: Secondary | ICD-10-CM

## 2021-08-12 DIAGNOSIS — E538 Deficiency of other specified B group vitamins: Secondary | ICD-10-CM

## 2021-08-12 MED ORDER — FOLIC ACID 1 MG PO TABS: 1.0000 mg | ORAL_TABLET | Freq: Every day | ORAL | 1 refills | Status: DC

## 2021-08-12 MED ORDER — VITAMIN B-12 1000 MCG SL SUBL: 1000.0000 ug | SUBLINGUAL_TABLET | Freq: Every day | SUBLINGUAL | 1 refills | Status: DC

## 2021-08-12 NOTE — Progress Notes (Signed)
[name] [name] DOB: 1956-08-16 Encounter date: 08/12/2021  This is a 65 y.o. female who presents with Chief Complaint  Patient presents with   Follow-up    History of present illness: Last visit with me was virtual on 07/13/2021.  At that time she brought up concerns for worsening memory issues.  We decided to start with blood work and consider further evaluation pending this.  She was noted to be deficient in vitamin B12. Unfortunately her phone wasn't working well when we called with bloodwork so she never heard results and did not start on b12 supplementation.   Stressful living environment. Significant other's adult children live with them.   Wouldn't hurt self, and feels that mood is stable overall, but living situation creates increased stress. Worries about living alone. Hasn't lived alone in her life. Does have daughter in area that she could stay with if in a bind.    Allergies  Allergen Reactions   Ciprofloxacin Itching and Palpitations    migraine   Macrobid [Nitrofurantoin Macrocrystal] Other (See Comments)    Body aches and Migraine headache   Ampicillin Other (See Comments)    Told by md not to take ampilciian due to penicillin allergy   Atorvastatin Other (See Comments)    Felt like had flu   Buspirone Hcl Other (See Comments)    REACTION: intolerance   Ezetimibe-Simvastatin Other (See Comments)    Unknown   Latex Other (See Comments)    Redness and burning   Lyrica [Pregabalin] Other (See Comments)    Caused swelling in legs and hands   Morphine And Related Itching    Not be a nice person   Penicillins Other (See Comments)    Fever and itching   Sulfa Antibiotics Nausea Only    Tolerate sometimes   Current Meds  Medication Sig   acyclovir ointment (ZOVIRAX) 5 % Apply 1 application topically every 4 (four) hours as needed. (Patient taking differently: Apply 1 application topically every 4 (four) hours as needed (mouth sores).)   ALPRAZolam (XANAX) 1 MG  tablet Take 0.5-1 mg by mouth at bedtime as needed for sleep.   amphetamine-dextroamphetamine (ADDERALL) 30 MG tablet Take 30 mg by mouth daily.   ARIPiprazole (ABILIFY PO) Take by mouth at bedtime.   clopidogrel (PLAVIX) 75 MG tablet Take 1 tablet (75 mg total) by mouth daily with breakfast.   Cyanocobalamin (VITAMIN B-12) 1000 MCG SUBL Place 1 tablet (1,000 mcg total) under the tongue daily.   cyclobenzaprine (FLEXERIL) 10 MG tablet Take 10 mg by mouth 3 (three) times daily as needed for muscle spasms.   diclofenac Sodium (VOLTAREN) 1 % GEL Apply 2 g topically 3 (three) times daily as needed (pain).   folic acid (FOLVITE) 1 MG tablet Take 1 tablet (1 mg total) by mouth daily.   gabapentin (NEURONTIN) 300 MG capsule Take 300 mg by mouth as needed.   Heating Pad PADS Use 3 times weekly   hydrOXYzine (ATARAX/VISTARIL) 25 MG tablet Take 25 mg by mouth 2 (two) times daily as needed for itching.   ondansetron (ZOFRAN-ODT) 4 MG disintegrating tablet DISSOLVE 1 TABLET BY MOUTH EVERY 8 HOURS AS NEEDED FOR NAUSEA   Oxycodone HCl 10 MG TABS Take 10 mg by mouth 4 (four) times daily as needed (pain).   promethazine (PHENERGAN) 25 MG suppository INSERT 1 SUPPOSITORY RECTALLY EVERY 8 HOURS AS NEEDED.   rosuvastatin (CRESTOR) 20 MG tablet TAKE (1) TABLET BY MOUTH ONCE DAILY.   VIIBRYD 40 MG TABS  TAKE (1) TABLET BY MOUTH ONCE DAILY WITH FULL MEAL.    Review of Systems  Constitutional:  Negative for chills, fatigue and fever.  Respiratory:  Negative for cough, chest tightness, shortness of breath and wheezing.   Cardiovascular:  Negative for chest pain, palpitations and leg swelling.   Objective:  BP 130/64 (BP Location: Right Arm, Patient Position: Sitting, Cuff Size: Normal)   Pulse 88   Temp 97.7 F (36.5 C) (Oral)   Ht 5' 3.5" (1.613 m)   Wt 155 lb 6.4 oz (70.5 kg)   SpO2 97%   BMI 27.10 kg/m   Weight: 155 lb 6.4 oz (70.5 kg)   BP Readings from Last 3 Encounters:  08/12/21 130/64  07/21/21  118/70  07/13/21 (!) 144/91   Wt Readings from Last 3 Encounters:  08/12/21 155 lb 6.4 oz (70.5 kg)  07/21/21 154 lb (69.9 kg)  07/06/21 153 lb 6.4 oz (69.6 kg)    Physical Exam Constitutional:      General: She is not in acute distress.    Appearance: She is well-developed.  Cardiovascular:     Rate and Rhythm: Normal rate and regular rhythm.     Heart sounds: Normal heart sounds. No murmur heard.   No friction rub.  Pulmonary:     Effort: Pulmonary effort is normal. No respiratory distress.     Breath sounds: Normal breath sounds. No wheezing or rales.  Musculoskeletal:     Right lower leg: No edema.     Left lower leg: No edema.  Neurological:     Mental Status: She is alert and oriented to person, place, and time.  Psychiatric:        Behavior: Behavior normal.  Montreal cog assessment completed: see scanned copy. Patient completed 9th grade, so with adjusted score and consideration for history of difficulties with ?dyslexia, I do feel score is normal. Recall was great, clock test was normal. She repeated all numbers but mixed up order and performed math correctly.   Assessment/Plan 1. B12 deficiency Discussed supplementation and plan to recheck. Discussed that this will help with energy as well as cognition.  - Cyanocobalamin (VITAMIN B-12) 1000 MCG SUBL; Place 1 tablet (1,000 mcg total) under the tongue daily.  Dispense: 90 tablet; Refill: 1 - Vitamin B12; Future - Vitamin B12 - Vitamin B12; Future  2. Folate deficiency - folic acid (FOLVITE) 1 MG tablet; Take 1 tablet (1 mg total) by mouth daily.  Dispense: 90 tablet; Refill: 1 - Folate; Future - Folate - Folate; Future  3. Memory impairment Memory testing was good today. She has a significant amount of situational anxiety and depression.  I feel that this stressful environment she is living in is contributing to her feeling of mental cloudiness and difficulty with remembering.  We will continue to address.  4.  DEPRESSION/ANXIETY Currently on Viibryd and Abilify.  Mood stable, although living situation creates increased stress.  5. Need for pneumococcal vaccination - Pneumococcal conjugate vaccine 20-valent (Prevnar 20)  6. Need for immunization against influenza - Flu Vaccine QUAD High Dose(Fluad)   Return in about 3 months (around 11/12/2021) for bloodwork; then follow up.   30 minutes spent in chart review, time with patient, reviewing prior labwork (we never reached her by phone), and treatment for this.    Micheline Rough, MD

## 2021-08-12 NOTE — Patient Instructions (Signed)
vitamin B12 and folate are deficient. This can affect memory as well as energy. Suggest supplementing folate 1mg  daily and b12 sublingual 1016mcg daily. It will take a few months to build up these levels. Suggest follow up in the office at that time for repeat bloodwork and follow up. Other bloodwork looks stable.

## 2021-08-19 ENCOUNTER — Other Ambulatory Visit: Payer: Self-pay | Admitting: Family Medicine

## 2021-08-22 DIAGNOSIS — G894 Chronic pain syndrome: Secondary | ICD-10-CM | POA: Diagnosis not present

## 2021-08-22 DIAGNOSIS — M5416 Radiculopathy, lumbar region: Secondary | ICD-10-CM | POA: Diagnosis not present

## 2021-09-14 DIAGNOSIS — M5416 Radiculopathy, lumbar region: Secondary | ICD-10-CM | POA: Diagnosis not present

## 2021-09-14 DIAGNOSIS — G894 Chronic pain syndrome: Secondary | ICD-10-CM | POA: Diagnosis not present

## 2021-09-15 ENCOUNTER — Other Ambulatory Visit: Payer: Self-pay | Admitting: Internal Medicine

## 2021-09-15 NOTE — Telephone Encounter (Signed)
New Message:     What mg of Rosuvastatin is patient supposed to be taking?  Which ever one is correct, please call it in for the patient please.     *STAT* If patient is at the pharmacy, call can be transferred to refill team.   1. Which medications need to be refilled? (please list name of each medication and dose if known) Rosuvastatin  2. Which pharmacy/location (including street and city if local pharmacy) is medication to be sent to? [facility]  3. Do they need a 30 day or 90 day supply? 90 days and refills

## 2021-09-21 DIAGNOSIS — Z20822 Contact with and (suspected) exposure to covid-19: Secondary | ICD-10-CM | POA: Diagnosis not present

## 2021-09-21 DIAGNOSIS — K0889 Other specified disorders of teeth and supporting structures: Secondary | ICD-10-CM | POA: Diagnosis not present

## 2021-09-21 DIAGNOSIS — J3489 Other specified disorders of nose and nasal sinuses: Secondary | ICD-10-CM | POA: Diagnosis not present

## 2021-09-21 DIAGNOSIS — R300 Dysuria: Secondary | ICD-10-CM | POA: Diagnosis not present

## 2021-09-21 DIAGNOSIS — R350 Frequency of micturition: Secondary | ICD-10-CM | POA: Diagnosis not present

## 2021-10-18 DIAGNOSIS — G894 Chronic pain syndrome: Secondary | ICD-10-CM | POA: Diagnosis not present

## 2021-10-18 DIAGNOSIS — M5416 Radiculopathy, lumbar region: Secondary | ICD-10-CM | POA: Diagnosis not present

## 2021-11-11 ENCOUNTER — Other Ambulatory Visit: Payer: Medicare Other

## 2021-11-15 ENCOUNTER — Other Ambulatory Visit (INDEPENDENT_AMBULATORY_CARE_PROVIDER_SITE_OTHER): Payer: Medicare Other

## 2021-11-15 DIAGNOSIS — E538 Deficiency of other specified B group vitamins: Secondary | ICD-10-CM | POA: Diagnosis not present

## 2021-11-15 DIAGNOSIS — M5416 Radiculopathy, lumbar region: Secondary | ICD-10-CM | POA: Diagnosis not present

## 2021-11-15 DIAGNOSIS — G894 Chronic pain syndrome: Secondary | ICD-10-CM | POA: Diagnosis not present

## 2021-11-16 LAB — VITAMIN B12: Vitamin B-12: 589 pg/mL (ref 211–911)

## 2021-11-16 LAB — FOLATE: Folate: >24.2 ng/mL (ref >5.9)

## 2021-11-18 ENCOUNTER — Ambulatory Visit (INDEPENDENT_AMBULATORY_CARE_PROVIDER_SITE_OTHER): Payer: Medicare Other | Admitting: Family Medicine

## 2021-11-18 ENCOUNTER — Encounter: Payer: Self-pay | Admitting: Family Medicine

## 2021-11-18 VITALS — BP 112/80 | HR 88 | Temp 97.6°F | Ht 63.5 in | Wt 159.1 lb

## 2021-11-18 DIAGNOSIS — R5383 Other fatigue: Secondary | ICD-10-CM | POA: Diagnosis not present

## 2021-11-18 DIAGNOSIS — E162 Hypoglycemia, unspecified: Secondary | ICD-10-CM

## 2021-11-18 DIAGNOSIS — L989 Disorder of the skin and subcutaneous tissue, unspecified: Secondary | ICD-10-CM | POA: Diagnosis not present

## 2021-11-18 DIAGNOSIS — M2550 Pain in unspecified joint: Secondary | ICD-10-CM | POA: Diagnosis not present

## 2021-11-18 DIAGNOSIS — E538 Deficiency of other specified B group vitamins: Secondary | ICD-10-CM

## 2021-11-18 NOTE — Progress Notes (Unsigned)
[name] [name] DOB: 12/11/1955 Encounter date: 11/18/2021  This is a 66 y.o. female who presents with follow up for vitamin deficiency. Craving sugar a lot.   History of present illness: Last visit was 08/12/21.   We discussed b12 deficiency: she had repeat bloodwork on 11/15/21 and folate and b12 levels were normal. Memory feels better. Energy level doesn't feel better. She was feeling better taking supplement she got online but then had covid exposure (never tested positive) but has felt tired since.  Has taken abx for tooth infection. Also got abx for blood in urine. Saw urgent care12/28/22. On abx from dentist now- clindamycin. On this for 10 days. She will follow up for cavity, broken tooth correction before they can get her false teeth.she is upset about the condition of her teeth now. Started clindamycin last week.   Breathing is ok; just gets winded easier. She sweats a lot with activity. Feels hot easily. Like old hot flash. States that she gets a little shaky, nervous.   Mcp and pip joint pain feels like it is getting worse. Swelling of knuckles.   Allergies  Allergen Reactions   Ciprofloxacin Itching and Palpitations    migraine   Macrobid [Nitrofurantoin Macrocrystal] Other (See Comments)    Body aches and Migraine headache   Ampicillin Other (See Comments)    Told by md not to take ampilciian due to penicillin allergy   Atorvastatin Other (See Comments)    Felt like had flu   Buspirone Hcl Other (See Comments)    REACTION: intolerance   Ezetimibe-Simvastatin Other (See Comments)    Unknown   Latex Other (See Comments)    Redness and burning   Lyrica [Pregabalin] Other (See Comments)    Caused swelling in legs and hands   Morphine And Related Itching    Not be a nice person   Penicillins Other (See Comments)    Fever and itching   Sulfa Antibiotics Nausea Only    Tolerate sometimes   Current Meds  Medication Sig   acyclovir ointment (ZOVIRAX) 5 % Apply 1  application topically every 4 (four) hours as needed. (Patient taking differently: Apply 1 application topically every 4 (four) hours as needed (mouth sores).)   ALPRAZolam (XANAX) 1 MG tablet Take 0.5-1 mg by mouth at bedtime as needed for sleep.   amphetamine-dextroamphetamine (ADDERALL) 30 MG tablet Take 30 mg by mouth daily.   ARIPiprazole (ABILIFY PO) Take by mouth at bedtime.   clindamycin (CLEOCIN) 300 MG capsule Take by mouth.   clopidogrel (PLAVIX) 75 MG tablet Take 1 tablet (75 mg total) by mouth daily with breakfast.   Cyanocobalamin (VITAMIN B-12) 1000 MCG SUBL Place 1 tablet (1,000 mcg total) under the tongue daily.   cyclobenzaprine (FLEXERIL) 10 MG tablet Take 10 mg by mouth 3 (three) times daily as needed for muscle spasms.   diclofenac Sodium (VOLTAREN) 1 % GEL Apply 2 g topically 3 (three) times daily as needed (pain).   folic acid (FOLVITE) 1 MG tablet Take 1 tablet (1 mg total) by mouth daily.   gabapentin (NEURONTIN) 300 MG capsule Take 300 mg by mouth as needed.   Heating Pad PADS Use 3 times weekly   hydrOXYzine (ATARAX/VISTARIL) 25 MG tablet Take 25 mg by mouth 2 (two) times daily as needed for itching.   ondansetron (ZOFRAN-ODT) 4 MG disintegrating tablet DISSOLVE 1 TABLET BY MOUTH EVERY 8 HOURS AS NEEDED FOR NAUSEA   Oxycodone HCl 10 MG TABS Take 10 mg by mouth  4 (four) times daily as needed (pain).   promethazine (PHENERGAN) 25 MG suppository INSERT 1 SUPPOSITORY RECTALLY EVERY 8 HOURS AS NEEDED.   rosuvastatin (CRESTOR) 20 MG tablet TAKE (1) TABLET BY MOUTH ONCE DAILY.   Vilazodone HCl (VIIBRYD) 40 MG TABS TAKE (1) TABLET BY MOUTH ONCE DAILY WITH FULL MEAL.    Review of Systems  Objective:  BP 112/80 (BP Location: Left Arm, Patient Position: Sitting, Cuff Size: Normal)    Pulse 88    Temp 97.6 F (36.4 C) (Oral)    Ht 5' 3.5" (1.613 m)    Wt 159 lb 1.6 oz (72.2 kg)    SpO2 98%    BMI 27.74 kg/m   Weight: 159 lb 1.6 oz (72.2 kg)   BP Readings from Last 3  Encounters:  11/18/21 112/80  08/12/21 130/64  07/21/21 118/70   Wt Readings from Last 3 Encounters:  11/18/21 159 lb 1.6 oz (72.2 kg)  08/12/21 155 lb 6.4 oz (70.5 kg)  07/21/21 154 lb (69.9 kg)    Physical Exam  Assessment/Plan  There are no diagnoses linked to this encounter.       Micheline Rough, MD

## 2021-11-20 LAB — CBC WITH DIFFERENTIAL/PLATELET
Absolute Monocytes: 601 cells/uL (ref 200–950)
Basophils Absolute: 62 cells/uL (ref 0–200)
Basophils Relative: 0.8 %
Eosinophils Absolute: 523 cells/uL — ABNORMAL HIGH (ref 15–500)
Eosinophils Relative: 6.7 %
HCT: 45.6 % — ABNORMAL HIGH (ref 35.0–45.0)
Hemoglobin: 15.8 g/dL — ABNORMAL HIGH (ref 11.7–15.5)
Lymphs Abs: 2590 cells/uL (ref 850–3900)
MCH: 31.5 pg (ref 27.0–33.0)
MCHC: 34.6 g/dL (ref 32.0–36.0)
MCV: 90.8 fL (ref 80.0–100.0)
MPV: 9.2 fL (ref 7.5–12.5)
Monocytes Relative: 7.7 %
Neutro Abs: 4025 cells/uL (ref 1500–7800)
Neutrophils Relative %: 51.6 %
Platelets: 335 10*3/uL (ref 140–400)
RBC: 5.02 10*6/uL (ref 3.80–5.10)
RDW: 12.2 % (ref 11.0–15.0)
Total Lymphocyte: 33.2 %
WBC: 7.8 10*3/uL (ref 3.8–10.8)

## 2021-11-20 LAB — TSH: TSH: 3.47 mIU/L (ref 0.40–4.50)

## 2021-11-20 LAB — ANA: Anti Nuclear Antibody (ANA): NEGATIVE

## 2021-11-20 LAB — COMPREHENSIVE METABOLIC PANEL
AG Ratio: 1.6 (calc) (ref 1.0–2.5)
ALT: 17 U/L (ref 6–29)
AST: 21 U/L (ref 10–35)
Albumin: 4.3 g/dL (ref 3.6–5.1)
Alkaline phosphatase (APISO): 107 U/L (ref 37–153)
BUN: 11 mg/dL (ref 7–25)
CO2: 30 mmol/L (ref 20–32)
Calcium: 9.3 mg/dL (ref 8.6–10.4)
Chloride: 102 mmol/L (ref 98–110)
Creat: 0.96 mg/dL (ref 0.50–1.05)
Globulin: 2.7 g/dL (calc) (ref 1.9–3.7)
Glucose, Bld: 82 mg/dL (ref 65–99)
Potassium: 5 mmol/L (ref 3.5–5.3)
Sodium: 138 mmol/L (ref 135–146)
Total Bilirubin: 0.4 mg/dL (ref 0.2–1.2)
Total Protein: 7 g/dL (ref 6.1–8.1)

## 2021-11-20 LAB — SEDIMENTATION RATE: Sed Rate: 6 mm/h (ref 0–30)

## 2021-11-20 LAB — RHEUMATOID FACTOR: Rheumatoid fact SerPl-aCnc: <14 IU/mL (ref <14)

## 2021-11-20 LAB — C-REACTIVE PROTEIN: CRP: 2.7 mg/L (ref <8.0)

## 2021-12-06 ENCOUNTER — Telehealth: Payer: Self-pay | Admitting: Family Medicine

## 2021-12-06 NOTE — Chronic Care Management (AMB) (Signed)
?  Chronic Care Management  ? ?Note ? ?12/06/2021 ?Name: [name] MRN: [id number] DOB: 1955/12/07 ? ?[name] [name] is a 66 y.o. year old female who is a primary care patient of Koberlein, Steele Berg, MD. I reached out to [name] [name] by phone today in response to a referral sent by [name]'s PCP, Caren Macadam, MD.  ? ?[name] was given information about Chronic Care Management services today including:  ?CCM service includes personalized support from designated clinical staff supervised by her physician, including individualized plan of care and coordination with other care providers ?24/7 contact phone numbers for assistance for urgent and routine care needs. ?Service will only be billed when office clinical staff spend 20 minutes or more in a month to coordinate care. ?Only one practitioner may furnish and bill the service in a calendar month. ?The patient may stop CCM services at any time (effective at the end of the month) by phone call to the office staff. ? ? ?Patient agreed to services and verbal consent obtained.  ? ?Follow up plan: ? ? ?Tatjana Dellinger ?Upstream Scheduler  ?

## 2021-12-08 ENCOUNTER — Telehealth: Payer: Self-pay | Admitting: Pharmacist

## 2021-12-08 NOTE — Chronic Care Management (AMB) (Signed)
? ? ?Chronic Care Management ?Pharmacy Assistant  ? ?Name: ALYSE KATHAN  MRN: [id number] DOB: 07/04/1956 ? ?[name] [name] is an 66 y.o. year old female who presents for her initial CCM visit with the clinical pharmacist. ? ?Reason for Encounter: Chart prep for initial visit with Jeni Salles, Clinical Pharmacist on 12/13/2021 at 3:00 via the telephone. ?  ?Conditions to be addressed/monitored: ?HLD, Anxiety, Depression, Osteopenia, and Osteoarthritis, IBS, Aortic stenosis, Insomnia and Chronic pain. ? ?Recent office visits:  ?07/13/2021 Micheline Rough MD - Patient was seen for memory loss and additional issues. Discontinued artificial tear, Azithromycin and Ibuprofen. No follow up noted.   ? ?Recent consult visits:  ?07/06/2021 Kathyrn Drown NP (cardiology) - Patient was seen for medication management and additional issues. Started Azithromycin 500 mg prior to dental work. No follow up noted.  ? ?06/16/2021 Lauree Chandler MD (cardiology) - Patient was seen for severe aortic stenosis. No medication changes. No follow up noted.  ? ?Hospital visits: ?Patient was seen for procedure at [facility] on 06/28/2021 due to transcatheter aortic valve replacement. Discharge date was 06/29/2021 (33 hours).   ? ?Medications: ?Outpatient Encounter Medications as of 12/08/2021  ?Medication Sig  ? acyclovir ointment (ZOVIRAX) 5 % Apply 1 application topically every 4 (four) hours as needed. (Patient taking differently: Apply 1 application topically every 4 (four) hours as needed (mouth sores).)  ? ALPRAZolam (XANAX) 1 MG tablet Take 0.5-1 mg by mouth at bedtime as needed for sleep.  ? amphetamine-dextroamphetamine (ADDERALL) 30 MG tablet Take 30 mg by mouth daily.  ? ARIPiprazole (ABILIFY PO) Take by mouth at bedtime.  ? clindamycin (CLEOCIN) 300 MG capsule Take by mouth.  ? clopidogrel (PLAVIX) 75 MG tablet Take 1 tablet (75 mg total) by mouth daily with breakfast.  ? Cyanocobalamin (VITAMIN B-12) 1000  MCG SUBL Place 1 tablet (1,000 mcg total) under the tongue daily.  ? cyclobenzaprine (FLEXERIL) 10 MG tablet Take 10 mg by mouth 3 (three) times daily as needed for muscle spasms.  ? diclofenac Sodium (VOLTAREN) 1 % GEL Apply 2 g topically 3 (three) times daily as needed (pain).  ? folic acid (FOLVITE) 1 MG tablet Take 1 tablet (1 mg total) by mouth daily.  ? gabapentin (NEURONTIN) 300 MG capsule Take 300 mg by mouth as needed.  ? Heating Pad PADS Use 3 times weekly  ? hydrOXYzine (ATARAX/VISTARIL) 25 MG tablet Take 25 mg by mouth 2 (two) times daily as needed for itching.  ? ondansetron (ZOFRAN-ODT) 4 MG disintegrating tablet DISSOLVE 1 TABLET BY MOUTH EVERY 8 HOURS AS NEEDED FOR NAUSEA  ? Oxycodone HCl 10 MG TABS Take 10 mg by mouth 4 (four) times daily as needed (pain).  ? promethazine (PHENERGAN) 25 MG suppository INSERT 1 SUPPOSITORY RECTALLY EVERY 8 HOURS AS NEEDED.  ? rosuvastatin (CRESTOR) 20 MG tablet TAKE (1) TABLET BY MOUTH ONCE DAILY.  ? Vilazodone HCl (VIIBRYD) 40 MG TABS TAKE (1) TABLET BY MOUTH ONCE DAILY WITH FULL MEAL.  ? ?No facility-administered encounter medications on file as of 12/08/2021.  ?Fill History: ?ALPRAZOLAM 1 MG TA 12/06/2021 30  ? ?ARIPIPRAZOLE 2 MG 10/22/2021 30  ? ?DEXTROAMP-AMPHETAM 10/03/2021 30  ? ?CLOPIDOGREL 75 MG 11/22/2021 90  ? ?CYCLOBENZAPRINE HYDROCHLORIDE  10 MG TABS 09/03/2021 30  ? ?ROSUVASTATIN CALCI 10/22/2021 90  ? 09/03/2021 63  ? ?VILAZODONE HCL 40 08/23/2021 90  ? ?OXYCODONE HCL (IR) 11/21/2021 30  ? ?Have you seen any other providers since your last visit? **Patient denies  any recent visits outside of [facility] ( she does see her pain specialist monthly) ? ?Any changes in your medications or health? Patient denies and medication changes  ? ?Any side effects from any medications? Patient denies and side effects.  ? ?Do you have an symptoms or problems not managed by your medications? Patient denies ? ?Any concerns about your health right now? Patient continues to be  concerned with her memory, fatigue and pain ? ?Has your provider asked that you check blood pressure, blood sugar, or follow special diet at home? Patient denies all the above.  ? ?Do you get any type of exercise on a regular basis? Patient denies any exercise. ? ?Can you think of a goal you would like to reach for your health? Patient just wants to feel better. ? ?Do you have any problems getting your medications? Patient denies, she just mentions the pharmacy is short handed and they are slow ? ?Is there anything that you would like to discuss during the appointment? Patient denies any other issues.  ? ?Patient reminded of appointment time, 12/13/2021 at 3:00 via the telephone. Patient was also reminded to have medications and supplements ready to review at appointment ? ?Care Gaps: ?AWV - message sent to Ramond Craver ?Last BP - 112/80 on 11/18/2021 ?Last A1C - future order placed ?Covid booster - overdue ?Dexascan - never done ? ?Star Rating Drugs: ?Rosuvastatin 20 mg - last filled 10/22/2021 90 DS at [place] ? ? ?[place]  ?Clinical Pharmacist Assistant ?[phone] ? ?

## 2021-12-13 ENCOUNTER — Telehealth: Payer: Medicare Other

## 2021-12-13 NOTE — Progress Notes (Deleted)
? ?Chronic Care Management ?Pharmacy Note ? ?12/13/2021 ?Name:  [name] [name] MRN:  [id number] DOB:  June 29, 1956 ? ?Summary: ?*** ? ?Recommendations/Changes made from today's visit: ?*** ? ?Plan: ?*** ? ? ?Subjective: ?CHELLSEA [name] is an 66 y.o. year old female who is a primary patient of Koberlein, Steele Berg, MD.  The CCM team was consulted for assistance with disease management and care coordination needs.   ? ?Engaged with patient by telephone for initial visit in response to provider referral for pharmacy case management and/or care coordination services.  ? ?Consent to Services:  ?The patient was given the following information about Chronic Care Management services today, agreed to services, and gave verbal consent: 1. CCM service includes personalized support from designated clinical staff supervised by the primary care provider, including individualized plan of care and coordination with other care providers 2. 24/7 contact phone numbers for assistance for urgent and routine care needs. 3. Service will only be billed when office clinical staff spend 20 minutes or more in a month to coordinate care. 4. Only one practitioner may furnish and bill the service in a calendar month. 5.The patient may stop CCM services at any time (effective at the end of the month) by phone call to the office staff. 6. The patient will be responsible for cost sharing (co-pay) of up to 20% of the service fee (after annual deductible is met). Patient agreed to services and consent obtained. ? ?Patient Care Team: ?Caren Macadam, MD as PCP - General (Family Medicine) ?Chucky May, MD as Consulting Physician (Psychiatry) ?Viona Gilmore, [facility] as Pharmacist (Pharmacist) ? ?Recent office visits: ?11/18/21 Micheline Rough, MD: Patient presented for follow up for vitamin deficiencies.  ? ?08/12/21 Micheline Rough, MD: Patient presented for follow up. Recommended vitamin B12 3557 mcg daily, folic acid 1 mg daily. Administered  influenza and Prevnar20 vaccines. ? ?07/13/2021 Micheline Rough MD - Patient was seen for memory loss and additional issues. Discontinued artificial tear, Azithromycin and Ibuprofen. No follow up noted.   ? ?Recent consult visits: ?09/21/21 Evelina Dun, NP (extended care): Patient presented for dental pain and back pain.  Prescribed erythromycin 500 mg x 10 days. ? ?09/14/21 Max Cohen (ortho): Patient presented for chronic pain follow up. ? ?07/21/21 Kathyrn Drown NP (cardiology) - Patient presented for TAVR follow up. Recommended stopping Plavix in April 2023 and starting aspirin 81 mg daily when stopping Plavix. ? ?07/06/2021 Kathyrn Drown NP (cardiology) - Patient was seen for medication management and additional issues. Started Azithromycin 500 mg prior to dental work. No follow up noted.  ?  ?06/16/2021 Lauree Chandler MD (cardiology) - Patient was seen for severe aortic stenosis. No medication changes. No follow up noted.  ? ?Hospital visits: ?Patient was seen for procedure at [facility] on 06/28/2021 due to transcatheter aortic valve replacement. Discharge date was 06/29/2021 (33 hours).   ? ? ?Objective: ? ?Lab Results  ?Component Value Date  ? CREATININE 0.96 11/18/2021  ? BUN 11 11/18/2021  ? GFR 59.12 (L) 07/21/2021  ? EGFR 61 07/06/2021  ? GFRNONAA >60 06/29/2021  ? GFRAA >60 06/21/2017  ? NA 138 11/18/2021  ? K 5.0 11/18/2021  ? CALCIUM 9.3 11/18/2021  ? CO2 30 11/18/2021  ? GLUCOSE 82 11/18/2021  ? ? ?Lab Results  ?Component Value Date/Time  ? HGBA1C 5.1 03/19/2020 11:37 AM  ? GFR 59.12 (L) 07/21/2021 11:21 AM  ? GFR 54.53 (L) 03/19/2020 11:37 AM  ?  ?Last diabetic Eye exam: No  results found for: HMDIABEYEEXA  ?Last diabetic Foot exam: No results found for: HMDIABFOOTEX  ? ?Lab Results  ?Component Value Date  ? CHOL 200 07/21/2021  ? HDL 59.00 07/21/2021  ? [place] 67 11/03/2020  ? LDLDIRECT 112.0 07/21/2021  ? TRIG 214.0 (H) 07/21/2021  ? CHOLHDL 3 07/21/2021  ? ? ?Hepatic  Function Latest Ref Rng & Units 11/18/2021 07/21/2021 06/24/2021  ?Total Protein 6.1 - 8.1 g/dL 7.0 7.8 6.1(L)  ?Albumin 3.5 - 5.2 g/dL - 4.6 3.6  ?AST 10 - 35 U/L _0 ?ALT 6 - 29 U/L _1 ?Alk Phosphatase 39 - 117 U/L - 115 83  ?Total Bilirubin 0.2 - 1.2 mg/dL 0.4 0.8 0.7  ?Bilirubin, Direct 0.0 - 0.3 mg/dL - - -  ? ? ?Lab Results  ?Component Value Date/Time  ? TSH 3.47 11/18/2021 03:21 PM  ? TSH 3.94 07/21/2021 11:21 AM  ? FREET4 1.1 07/07/2020 02:30 PM  ? FREET4 0.8 07/15/2007 04:09 PM  ? ? ?CBC Latest Ref Rng & Units 11/18/2021 07/06/2021 06/29/2021  ?WBC 3.8 - 10.8 Thousand/uL 7.8 7.2 6.8  ?Hemoglobin 11.7 - 15.5 g/dL 15.8(H) 15.0 13.4  ?Hematocrit 35.0 - 45.0 % 45.6(H) 44.0 39.0  ?Platelets 140 - 400 Thousand/uL 335 355 214  ? ? ?Lab Results  ?Component Value Date/Time  ? VD25OH 36.40 07/21/2021 11:21 AM  ? VD25OH 15 (L) 07/07/2020 02:30 PM  ? VD25OH 23.23 (L) 03/19/2020 11:37 AM  ? ? ?Clinical ASCVD: {YES/NO:21197} ?The 10-year ASCVD risk score (Arnett DK, et al., 2019) is: 4.2% ?  Values used to calculate the score: ?    Age: 74 years ?    Sex: Female ?    Is Non-Hispanic African American: No ?    Diabetic: No ?    Tobacco smoker: No ?    Systolic Blood Pressure: 175 mmHg ?    Is BP treated: No ?    HDL Cholesterol: 59 mg/dL ?    Total Cholesterol: 200 mg/dL   ? ?Depression screen [facility] 2/9 08/12/2021 12/17/2020 03/19/2020  ?Decreased Interest 3 3 0  ?Down, Depressed, Hopeless 3 3 0  ?PHQ - 2 Score 6 6 0  ?Altered sleeping 3 3 -  ?Tired, decreased energy 3 3 -  ?Change in appetite 3 0 -  ?Feeling bad or failure about yourself  3 3 -  ?Trouble concentrating 3 3 -  ?Moving slowly or fidgety/restless 3 0 -  ?Suicidal thoughts 3 2 -  ?PHQ-9 Score 27 20 -  ?Difficult doing work/chores - - -  ?Some recent data might be hidden  ?  ? ?***Other: (CHADS2VASc if Afib, MMRC or CAT for COPD, ACT, DEXA) ? ?Social History  ? ?Tobacco Use  ?Smoking Status Never  ?Smokeless Tobacco Never  ? ?BP Readings from Last 3  Encounters:  ?11/18/21 112/80  ?08/12/21 130/64  ?07/21/21 118/70  ? ?Pulse Readings from Last 3 Encounters:  ?11/18/21 88  ?08/12/21 88  ?07/21/21 81  ? ?Wt Readings from Last 3 Encounters:  ?11/18/21 159 lb 1.6 oz (72.2 kg)  ?08/12/21 155 lb 6.4 oz (70.5 kg)  ?07/21/21 154 lb (69.9 kg)  ? ?BMI Readings from Last 3 Encounters:  ?11/18/21 27.74 kg/m?  ?08/12/21 27.10 kg/m?  ?07/21/21 26.85 kg/m?  ? ? ?Assessment/Interventions: Review of patient past medical history, allergies, medications, health status, including review of consultants reports, laboratory and other test data, was performed as part of comprehensive evaluation and provision of chronic care management services.  ? ?  SDOH:  (Social Determinants of Health) assessments and interventions performed: {yes/no:20286} ? ?SDOH Screenings  ? ?Alcohol Screen: Not on file  ?Depression (PHQ2-9): Medium Risk  ? PHQ-2 Score: 27  ?Financial Resource Strain: Not on file  ?Food Insecurity: Not on file  ?Housing: Not on file  ?Physical Activity: Not on file  ?Social Connections: Not on file  ?Stress: Not on file  ?Tobacco Use: Low Risk   ? Smoking Tobacco Use: Never  ? Smokeless Tobacco Use: Never  ? Passive Exposure: Not on file  ?Transportation Needs: Not on file  ? ?Patient continues to be concerned with her memory, fatigue and pain ? ?Patient denies, she just mentions the pharmacy is short handed and they are slow ? ? ?CCM Care Plan ? ?Allergies  ?Allergen Reactions  ? Ciprofloxacin Itching and Palpitations  ?  migraine  ? Macrobid [Nitrofurantoin Macrocrystal] Other (See Comments)  ?  Body aches and Migraine headache  ? Ampicillin Other (See Comments)  ?  Told by md not to take ampilciian due to penicillin allergy  ? Atorvastatin Other (See Comments)  ?  Felt like had flu  ? Buspirone Hcl Other (See Comments)  ?  REACTION: intolerance  ? Ezetimibe-Simvastatin Other (See Comments)  ?  Unknown  ? Latex Other (See Comments)  ?  Redness and burning  ? Lyrica [Pregabalin]  Other (See Comments)  ?  Caused swelling in legs and hands  ? Morphine And Related Itching  ?  Not be a nice person  ? Penicillins Other (See Comments)  ?  Fever and itching  ? Sulfa Antibiotics Nausea Only  ?

## 2021-12-14 DIAGNOSIS — M5416 Radiculopathy, lumbar region: Secondary | ICD-10-CM | POA: Diagnosis not present

## 2021-12-14 DIAGNOSIS — G894 Chronic pain syndrome: Secondary | ICD-10-CM | POA: Diagnosis not present

## 2021-12-14 DIAGNOSIS — I10 Essential (primary) hypertension: Secondary | ICD-10-CM | POA: Diagnosis not present

## 2021-12-14 DIAGNOSIS — Z79899 Other long term (current) drug therapy: Secondary | ICD-10-CM | POA: Diagnosis not present

## 2021-12-14 DIAGNOSIS — Z79891 Long term (current) use of opiate analgesic: Secondary | ICD-10-CM | POA: Diagnosis not present

## 2021-12-22 ENCOUNTER — Telehealth: Payer: Self-pay | Admitting: Pharmacist

## 2021-12-22 NOTE — Chronic Care Management (AMB) (Signed)
? ? ?  Chronic Care Management ?Pharmacy Assistant  ? ?Name: [name] [name]  MRN: [id number] DOB: [birth year] ? ?12/23/2021 APPOINTMENT REMINDER ? ?[name] [name] was reminded to have all medications, supplements and any blood glucose and blood pressure readings available for review with Jeni Salles, Pharm. D, at her telephone visit on 12/23/2021 at 11:00. ? ?Care Gaps: ?AWV - message sent to Ramond Craver ?Last BP - 112/80 on 11/18/2021 ?Last A1C - future order placed ?Covid booster - overdue ?Dexascan - never done ?  ?Star Rating Drugs: ?Rosuvastatin 20 mg - last filled 10/22/2021 90 DS at [facility] ? ? ?Any gaps in medications fill history? No ? ?Gennie Alma CMA  ?Clinical Pharmacist Assistant ?[phone] ? ?

## 2021-12-23 ENCOUNTER — Ambulatory Visit (INDEPENDENT_AMBULATORY_CARE_PROVIDER_SITE_OTHER): Payer: Medicare Other | Admitting: Pharmacist

## 2021-12-23 DIAGNOSIS — E785 Hyperlipidemia, unspecified: Secondary | ICD-10-CM | POA: Diagnosis not present

## 2021-12-23 DIAGNOSIS — F341 Dysthymic disorder: Secondary | ICD-10-CM

## 2021-12-23 NOTE — Progress Notes (Signed)
? ?Chronic Care Management ?Pharmacy Note ? ?12/23/2021 ?Name:  [name] [name] MRN:  [id number] DOB:  Apr 14, 1956 ? ?Summary: ?LDL not at goal < 100 ?Pt reports bruising with Plavix and is only taking 1/2 tablet ? ?Recommendations/Changes made from today's visit: ?-Recommend repeat DEXA ?-Recommended supplementing with calcium citrate 500-600 mg with vitamin D 1000 units daily ?-Recommended switching to aspirin 81 mg next week per plan post TAVR replacement ?-Consider switching rosuvastatin to pravastatin or addition of Zetia to minimize side effects with rosuvastatin ? ?Plan: ?Follow up after discussion with PCP and cardiology ? ? ?Subjective: ?[name] [name] is an 66 y.o. year old female who is a primary patient of Koberlein, Steele Berg, MD.  The CCM team was consulted for assistance with disease management and care coordination needs.   ? ?Engaged with patient by telephone for initial visit in response to provider referral for pharmacy case management and/or care coordination services.  ? ?Consent to Services:  ?The patient was given the following information about Chronic Care Management services today, agreed to services, and gave verbal consent: 1. CCM service includes personalized support from designated clinical staff supervised by the primary care provider, including individualized plan of care and coordination with other care providers 2. 24/7 contact phone numbers for assistance for urgent and routine care needs. 3. Service will only be billed when office clinical staff spend 20 minutes or more in a month to coordinate care. 4. Only one practitioner may furnish and bill the service in a calendar month. 5.The patient may stop CCM services at any time (effective at the end of the month) by phone call to the office staff. 6. The patient will be responsible for cost sharing (co-pay) of up to 20% of the service fee (after annual deductible is met). Patient agreed to services and consent obtained. ? ?Patient Care  Team: ?Caren Macadam, MD as PCP - General (Family Medicine) ?Chucky May, MD as Consulting Physician (Psychiatry) ?Viona Gilmore, [facility] as Pharmacist (Pharmacist) ? ?Recent office visits: ?11/18/21 Micheline Rough, MD: Patient presented for follow up for vitamin deficiencies.  ? ?08/12/21 Micheline Rough, MD: Patient presented for follow up. Recommended vitamin B12 6761 mcg daily, folic acid 1 mg daily. Administered influenza and Prevnar20 vaccines. ? ?07/13/2021 Micheline Rough MD - Patient was seen for memory loss and additional issues. Discontinued artificial tear, Azithromycin and Ibuprofen. No follow up noted.   ? ?Recent consult visits: ?09/21/21 Evelina Dun, NP (extended care): Patient presented for dental pain and back pain.  Prescribed erythromycin 500 mg x 10 days. ? ?09/14/21 Max Cohen (ortho): Patient presented for chronic pain follow up. ? ?07/21/21 Kathyrn Drown NP (cardiology) - Patient presented for TAVR follow up. Recommended stopping Plavix in April 2023 and starting aspirin 81 mg daily when stopping Plavix. ? ?07/06/2021 Kathyrn Drown NP (cardiology) - Patient was seen for medication management and additional issues. Started Azithromycin 500 mg prior to dental work. No follow up noted.  ?  ?06/16/2021 Lauree Chandler MD (cardiology) - Patient was seen for severe aortic stenosis. No medication changes. No follow up noted.  ? ?Hospital visits: ?Patient was seen for procedure at [facility] on 06/28/2021 due to transcatheter aortic valve replacement. Discharge date was 06/29/2021 (33 hours).   ? ? ?Objective: ? ?Lab Results  ?Component Value Date  ? CREATININE 0.96 11/18/2021  ? BUN 11 11/18/2021  ? GFR 59.12 (L) 07/21/2021  ? EGFR 61 07/06/2021  ? GFRNONAA >60 06/29/2021  ? GFRAA >60  06/21/2017  ? NA 138 11/18/2021  ? K 5.0 11/18/2021  ? CALCIUM 9.3 11/18/2021  ? CO2 30 11/18/2021  ? GLUCOSE 82 11/18/2021  ? ? ?Lab Results  ?Component Value Date/Time  ? HGBA1C 5.1  03/19/2020 11:37 AM  ? GFR 59.12 (L) 07/21/2021 11:21 AM  ? GFR 54.53 (L) 03/19/2020 11:37 AM  ?  ?Last diabetic Eye exam: No results found for: HMDIABEYEEXA  ?Last diabetic Foot exam: No results found for: HMDIABFOOTEX  ? ?Lab Results  ?Component Value Date  ? CHOL 200 07/21/2021  ? HDL 59.00 07/21/2021  ? [place] 67 11/03/2020  ? LDLDIRECT 112.0 07/21/2021  ? TRIG 214.0 (H) 07/21/2021  ? CHOLHDL 3 07/21/2021  ? ? ? ?  Latest Ref Rng & Units 11/18/2021  ?  3:21 PM 07/21/2021  ? 11:21 AM 06/24/2021  ?  9:37 AM  ?Hepatic Function  ?Total Protein 6.1 - 8.1 g/dL 7.0   7.8   6.1    ?Albumin 3.5 - 5.2 g/dL  4.6   3.6    ?AST 10 - 35 U/L 21   27   17     ?ALT 6 - 29 U/L 17   19   16     ?Alk Phosphatase 39 - 117 U/L  115   83    ?Total Bilirubin 0.2 - 1.2 mg/dL 0.4   0.8   0.7    ? ? ?Lab Results  ?Component Value Date/Time  ? TSH 3.47 11/18/2021 03:21 PM  ? TSH 3.94 07/21/2021 11:21 AM  ? FREET4 1.1 07/07/2020 02:30 PM  ? FREET4 0.8 07/15/2007 04:09 PM  ? ? ? ?  Latest Ref Rng & Units 11/18/2021  ?  3:21 PM 07/06/2021  ?  3:32 PM 06/29/2021  ?  1:04 AM  ?CBC  ?WBC 3.8 - 10.8 Thousand/uL 7.8   7.2   6.8    ?Hemoglobin 11.7 - 15.5 g/dL 15.8   15.0   13.4    ?Hematocrit 35.0 - 45.0 % 45.6   44.0   39.0    ?Platelets 140 - 400 Thousand/uL 335   355   214    ? ? ?Lab Results  ?Component Value Date/Time  ? VD25OH 36.40 07/21/2021 11:21 AM  ? VD25OH 15 (L) 07/07/2020 02:30 PM  ? VD25OH 23.23 (L) 03/19/2020 11:37 AM  ? ? ?Clinical ASCVD: No  ?The 10-year ASCVD risk score (Arnett DK, et al., 2019) is: 4.2% ?  Values used to calculate the score: ?    Age: 66 years ?    Sex: Female ?    Is Non-Hispanic African American: No ?    Diabetic: No ?    Tobacco smoker: No ?    Systolic Blood Pressure: 631 mmHg ?    Is BP treated: No ?    HDL Cholesterol: 59 mg/dL ?    Total Cholesterol: 200 mg/dL   ? ? ?  08/12/2021  ?  4:12 PM 12/17/2020  ?  4:35 PM 03/19/2020  ? 11:18 AM  ?Depression screen PHQ 2/9  ?Decreased Interest 3 3 0  ?Down, Depressed,  Hopeless 3 3 0  ?PHQ - 2 Score 6 6 0  ?Altered sleeping 3 3   ?Tired, decreased energy 3 3   ?Change in appetite 3 0   ?Feeling bad or failure about yourself  3 3   ?Trouble concentrating 3 3   ?Moving slowly or fidgety/restless 3 0   ?Suicidal thoughts 3 2   ?PHQ-9 Score 27  20   ?  ? ? ?Social History  ? ?Tobacco Use  ?Smoking Status Never  ?Smokeless Tobacco Never  ? ?BP Readings from Last 3 Encounters:  ?11/18/21 112/80  ?08/12/21 130/64  ?07/21/21 118/70  ? ?Pulse Readings from Last 3 Encounters:  ?11/18/21 88  ?08/12/21 88  ?07/21/21 81  ? ?Wt Readings from Last 3 Encounters:  ?11/18/21 159 lb 1.6 oz (72.2 kg)  ?08/12/21 155 lb 6.4 oz (70.5 kg)  ?07/21/21 154 lb (69.9 kg)  ? ?BMI Readings from Last 3 Encounters:  ?11/18/21 27.74 kg/m?  ?08/12/21 27.10 kg/m?  ?07/21/21 26.85 kg/m?  ? ? ?Assessment/Interventions: Review of patient past medical history, allergies, medications, health status, including review of consultants reports, laboratory and other test data, was performed as part of comprehensive evaluation and provision of chronic care management services.  ? ?SDOH:  (Social Determinants of Health) assessments and interventions performed: Yes ?SDOH Interventions   ? ?Flowsheet Row Most Recent Value  ?SDOH Interventions   ?Financial Strain Interventions Intervention Not Indicated  ?Transportation Interventions Intervention Not Indicated  ? ?  ? ?SDOH Screenings  ? ?Alcohol Screen: Not on file  ?Depression (PHQ2-9): Medium Risk  ? PHQ-2 Score: 27  ?Financial Resource Strain: Low Risk   ? Difficulty of Paying Living Expenses: Not hard at all  ?Food Insecurity: Not on file  ?Housing: Not on file  ?Physical Activity: Not on file  ?Social Connections: Not on file  ?Stress: Not on file  ?Tobacco Use: Low Risk   ? Smoking Tobacco Use: Never  ? Smokeless Tobacco Use: Never  ? Passive Exposure: Not on file  ?Transportation Needs: No Transportation Needs  ? Lack of Transportation (Medical): No  ? Lack of Transportation  (Non-Medical): No  ? ?Patient reports her pain medication usually makes it bearable and even with the medication. Patient does feel like the back pain is getting worse overall and feels similar to bi

## 2021-12-23 NOTE — Patient Instructions (Addendum)
Hi Demetress, ? ?It was great to get to meet you over the telephone! Below is a summary of some of the topics we discussed.  ? ?Don't forget to: ?-Start calcium citrate 500-600 mg with vitamin D 1000 units per day as a supplement to help build up your bones ?-Stop the clopidogrel next week and start taking aspirin 81 mg daily ?-For Collagen, it looks like the recommendation is anywhere from 5-10 grams per day for skin and muscle building ? ? ?Please reach out to me if you have any questions or need anything! ? ?Best, ?Maddie ? ?Jeni Salles, PharmD, BCACP ?Clinical Pharmacist ?Therapist, music at [facility] ?[phone] ? ? Visit Information ? ? Goals Addressed   ?None ?  ? ?Patient Care Plan: [place]  ?  ? ?Problem Identified: Problem: Hyperlipidemia, Depression, Anxiety, Osteopenia, Osteoarthritis, and ADHD, post TAVR replacement   ?  ? ?Long-Range Goal: Patient-Specific Goal   ?Start Date: 12/23/2021  ?Expected End Date: 12/24/2022  ?This Visit's Progress: On track  ?Priority: High  ?Note:   ?Current Barriers:  ?Unable to independently monitor therapeutic efficacy ?Unable to achieve control of cholesterol  ? ?Pharmacist Clinical Goal(s):  ?Patient will achieve adherence to monitoring guidelines and medication adherence to achieve therapeutic efficacy ?achieve control of cholesterol as evidenced by next lipid panel  through collaboration with PharmD and provider.  ? ?Interventions: ?1:1 collaboration with Caren Macadam, MD regarding development and update of comprehensive plan of care as evidenced by provider attestation and co-signature ?Inter-disciplinary care team collaboration (see longitudinal plan of care) ?Comprehensive medication review performed; medication list updated in electronic medical record ? ?Hyperlipidemia: (LDL goal < 100) ?-Uncontrolled ?-Current treatment: ?Rosuvastatin 20 mg 1 tablet daily - Appropriate, Query effective, Safe, Accessible ?-Medications previously tried:  atorvastatin (unknown), Vytorin (unknown)  ?-Current dietary patterns: not much fried foods; mostly desserts and soda ?-Current exercise habits: can't really walk very far and is planning on joining the [employer] ?-Educated on Cholesterol goals;  ?Benefits of statin for ASCVD risk reduction; ?Importance of limiting foods high in cholesterol; ?Exercise goal of 150 minutes per week; ?-Collaborated with cardiology to consider alternative cholesterol therapy. ? ?Depression/Anxiety (Goal: minimize symptoms) ?-Controlled ?-Current treatment: ?Alprazolam 1 mg 1/2 - 1 tablet at bedtime as needed - Appropriate, Effective, Query Safe, Accessible ?Aripiprazole  at bedtime ?Vilazodone 40 mg 1 tablet once daily - Appropriate, Effective, Safe, Accessible ?-Medications previously tried/failed: n/a ?-PHQ9: 27 ?-GAD7: 52 ?-Educated on Benefits of medication for symptom control ?Benefits of cognitive-behavioral therapy with or without medication ?-Counseled on diet and exercise extensively ?Recommended to continue current medication ?Counseled on long term risks of alprazolam including memory loss, falls and fractures and using sparingly. Patient already separates from pain medication by 4 hours or more. ? ?Osteopenia (Goal prevent fractures) ?-Uncontrolled ?-Last DEXA Scan:  2009 (unable to access)  ? T-Score femoral neck: -1.8 ? T-Score total hip: n/a ? T-Score lumbar spine: n/a ? T-Score forearm radius: n/a ? 10-year probability of major osteoporotic fracture: n/a ? 10-year probability of hip fracture: n/a ?-Patient is not a candidate for pharmacologic treatment ?-Current treatment  ?No medications ?-Medications previously tried: none  ?-Recommend [phone] units of vitamin D daily. Recommend 1200 mg of calcium daily from dietary and supplemental sources. Recommend weight-bearing and muscle strengthening exercises for building and maintaining bone density. ?-Counseled on diet and exercise extensively ?Recommended repeat DEXA.   ? ?Pain/muscle spasms (Goal: minimize pain) ?-Uncontrolled ?-Current treatment  ?Cyclobenzaprine 10 mg 1 tablet as needed -  Appropriate, Effective, Query Safe, Accessible ?Diclofenac gel 1% apply as needed - Appropriate, Effective, Safe, Accessible ?Gabapentin 300 mg 1 capsule as needed - Appropriate, Effective, Safe, Accessible ?Oxycodone 10 mg 1 tablet 4 times daily as needed - Appropriate, Effective, Query Safe, Accessible ?Ibuprofen 200 mg 1 tablet as needed - Appropriate, Effective, Query Safe, Accessible ?-Medications previously tried: n/a  ?-Recommended to continue current medication ?Recommended limiting use of NSAIDs due to risk for bruising/bleeding. ? ?Post TAVR replacement (Goal: prevent blood clots) ?-Not ideally controlled ?-Current treatment  ?Clopidogrel 75 mg - taking 1/2 tablet daily - Appropriate, Query effective, Safe, Accessible ?-Medications previously tried: none  ?-Recommended switching to aspirin 81 mg next week per plan post TAVR replacement. ? ? ?Health Maintenance ?-Vaccine gaps: COVID booster ?-Current therapy:  ?Folic acid 1 mg 1 tablet daily ?Vitamin B12 1000 mcg daily (dropper) ?Balance of nature vitamins daily ?Acyclovir 5% ointment apply as needed ?Ondansetron 4 mg as needed ?Promethazine 25 mg as needed ?Azithromycin 250 mg 1 tablet x 1 dose prior to dental visits ?-Educated on Cost vs benefit of each product must be carefully weighed by individual consumer ?- Patient is not satisfied with price of Balance of Nature. ?-Recommended adding more fruits and vegetables to diet ? ?Patient Goals/Self-Care Activities ?Patient will:  ?- take medications as prescribed as evidenced by patient report and record review ?target a minimum of 150 minutes of moderate intensity exercise weekly ?engage in dietary modifications by adding in more fiber to diet to lower cholesterol ? ?Follow Up Plan: The care management team will reach out to the patient again over the next 14 days.  ? ?  ? ? ?Ms.  Oceguera was given information about Chronic Care Management services today including:  ?CCM service includes personalized support from designated clinical staff supervised by her physician, including individualized plan of care and coordination with other care providers ?24/7 contact phone numbers for assistance for urgent and routine care needs. ?Standard insurance, coinsurance, copays and deductibles apply for chronic care management only during months in which we provide at least 20 minutes of these services. Most insurances cover these services at 100%, however patients may be responsible for any copay, coinsurance and/or deductible if applicable. This service may help you avoid the need for more expensive face-to-face services. ?Only one practitioner may furnish and bill the service in a calendar month. ?The patient may stop CCM services at any time (effective at the end of the month) by phone call to the office staff. ? ?Patient agreed to services and verbal consent obtained.  ? ?Patient verbalizes understanding of instructions and care plan provided today and agrees to view in [place]. Active MyChart status confirmed with patient.   ?The pharmacy team will reach out to the patient again over the next 14 days.  ? ?Viona Gilmore, RPH  ?

## 2021-12-24 ENCOUNTER — Other Ambulatory Visit: Payer: Self-pay | Admitting: Family Medicine

## 2021-12-24 DIAGNOSIS — Z1231 Encounter for screening mammogram for malignant neoplasm of breast: Secondary | ICD-10-CM

## 2021-12-24 DIAGNOSIS — E2839 Other primary ovarian failure: Secondary | ICD-10-CM

## 2021-12-27 ENCOUNTER — Telehealth: Payer: Self-pay | Admitting: Internal Medicine

## 2021-12-27 DIAGNOSIS — E785 Hyperlipidemia, unspecified: Secondary | ICD-10-CM

## 2021-12-27 MED ORDER — ROSUVASTATIN CALCIUM 5 MG PO TABS: ORAL_TABLET | ORAL | 1 refills | Status: DC

## 2021-12-27 NOTE — Telephone Encounter (Signed)
Rx sent for crestor '5mg'$  daily ? ?Patient is due for appointment with Hilty MD ?Message to scheduling team to arrange in ~ 3 months so she can get fasting labs prior ?

## 2021-12-27 NOTE — Telephone Encounter (Signed)
-----   Message from Pixie Casino, MD sent at 12/26/2021  9:18 PM EDT ----- ?Regarding: RE: Cholesterol regimen ?I will order it for her. ? ?Dr. Lemmie Evens ? ?----- Message ----- ?From: Viona Gilmore, [facility] ?Sent: 12/26/2021   4:13 PM EDT ?To: Pixie Casino, MD ?Subject: RE: Cholesterol regimen                       ? ?Ok would you be able to send in the 5 mg tablets? I can ask her PCP if you would rather do that.  ? ?Let me know either way! ?Thanks, ?Maddie ?----- Message ----- ?From: Pixie Casino, MD ?Sent: 12/23/2021   1:45 PM EDT ?To: Viona Gilmore, RPH ?Subject: RE: Cholesterol regimen                       ? ?I would probably have her try crestor 5 mg daily - see if she tolerates that and what it does for her cholesterol. Could then add zetia later if she is no longer having any symptoms. ? ?Dr Lemmie Evens ? ?----- Message ----- ?From: Viona Gilmore, [facility] ?Sent: 12/23/2021  12:15 PM EDT ?To: Pixie Casino, MD ?Subject: Cholesterol regimen                           ? ?Hi, ? ?I just spoke with Ms. Polyak to go through her medications and she reports the rosuvastatin makes her feel very fatigued. She is trying to take it every day but doesn't like how it makes her feel. She said it was much better with 1/2 tablet daily. I know she has tried other medications (atorvastatin, Vytorin) according to her list of intolerances but she cannot remember what her reaction was. What do you think about backing down to 1/2 of the rosuvastatin and adding Zetia? Or trying pravastatin instead? She may just be statin intolerant but she was open to trying anything that would make her feel better. ? ?If you need me to have her follow up with you instead to discuss, I can do that as well. Just let me know! ?Best, ?Maddie ? ?Jeni Salles, PharmD, BCACP ?Clinical Pharmacist ?Therapist, music at [place] ?[phone]  ? ? ? ? ?

## 2021-12-28 NOTE — Telephone Encounter (Signed)
LVM for patient to schedule f/u

## 2021-12-29 ENCOUNTER — Telehealth: Payer: Self-pay | Admitting: Pharmacist

## 2021-12-29 NOTE — Telephone Encounter (Signed)
Tried to call patient to follow up on CCM visit. Left voicemail and requested a call back. Will plan to follow up next week if patient doesn't call back. ?

## 2021-12-30 ENCOUNTER — Institutional Professional Consult (permissible substitution): Payer: Medicare Other | Admitting: Primary Care

## 2022-01-11 ENCOUNTER — Telehealth: Payer: Self-pay | Admitting: Family Medicine

## 2022-01-11 NOTE — Telephone Encounter (Signed)
Left message for patient to call back and schedule Medicare Annual Wellness Visit (AWV) either virtually or in office. Left  my Herbie Drape number [phone] ? ? ?awvi 12/24/21 per [facility] ?please schedule at anytime with [facility] Nurse Health Advisor 1 or 2 ? ? ?This should be a 45 minute visit.  ?

## 2022-01-16 DIAGNOSIS — D1801 Hemangioma of skin and subcutaneous tissue: Secondary | ICD-10-CM | POA: Diagnosis not present

## 2022-01-16 DIAGNOSIS — L853 Xerosis cutis: Secondary | ICD-10-CM | POA: Diagnosis not present

## 2022-01-16 DIAGNOSIS — L814 Other melanin hyperpigmentation: Secondary | ICD-10-CM | POA: Diagnosis not present

## 2022-01-16 DIAGNOSIS — L821 Other seborrheic keratosis: Secondary | ICD-10-CM | POA: Diagnosis not present

## 2022-01-18 DIAGNOSIS — M5416 Radiculopathy, lumbar region: Secondary | ICD-10-CM | POA: Diagnosis not present

## 2022-01-18 DIAGNOSIS — G894 Chronic pain syndrome: Secondary | ICD-10-CM | POA: Diagnosis not present

## 2022-01-18 DIAGNOSIS — I10 Essential (primary) hypertension: Secondary | ICD-10-CM | POA: Diagnosis not present

## 2022-01-23 ENCOUNTER — Encounter: Payer: Self-pay | Admitting: Pulmonary Disease

## 2022-01-23 ENCOUNTER — Ambulatory Visit (INDEPENDENT_AMBULATORY_CARE_PROVIDER_SITE_OTHER): Payer: Medicare Other | Admitting: Pulmonary Disease

## 2022-01-23 DIAGNOSIS — R053 Chronic cough: Secondary | ICD-10-CM | POA: Diagnosis not present

## 2022-01-23 DIAGNOSIS — F99 Mental disorder, not otherwise specified: Secondary | ICD-10-CM

## 2022-01-23 DIAGNOSIS — F5105 Insomnia due to other mental disorder: Secondary | ICD-10-CM

## 2022-01-23 MED ORDER — CHLORPHENIRAMINE-PHENYLEPHRINE 4-10 MG PO TABS: 1.0000 | ORAL_TABLET | Freq: Every day | ORAL | 0 refills | Status: DC
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
## 2022-01-23 NOTE — Patient Instructions (Signed)
?  X For post nasal drip, take [employer] brand sinus PE nightly x 2-4 weeks  ?(Chlorpheniramine 4 mg + phenylephrine 10 mg ) ? ? ?For insomnia, discuss meds with your PCP ?Rules of sleep hygiene were discussed  ?- light exercise ?-avoid caffeinated beverages ?- no more than 20 mins staying awake in bed, if not asleep, get out of bed & reading or light music ?- No TV or computer games at bedtime. ?-light exposure in the morning ? ?

## 2022-01-23 NOTE — Assessment & Plan Note (Signed)
She seems to have a prolonged sleep latency.  Insomnia seems to be mostly related to her situational anxiety and depression.  There is also poor hygiene. ? ?Rules of sleep hygiene were discussed  ?- light exercise ?-avoid caffeinated beverages ?- no more than 20 mins staying awake in bed, if not asleep, get out of bed & reading or light music ?- No TV or computer games at bedtime. ?-light exposure in the morning ? ?I encouraged her to see a counselor and discuss antidepressants that would also be helpful for insomnia.  I do not feel that she needs a study for sleep disordered breathing at this point ?

## 2022-01-23 NOTE — Assessment & Plan Note (Signed)
Seems to be related to postnasal drip rather than GERD or asthma. ?Previous imaging of lung parenchyma has been normal.  ?We will use for generation antihistaminic such as chlorpheniramine and decongestant presents for another friend for a 2 to 4-week trial ?

## 2022-01-27 ENCOUNTER — Other Ambulatory Visit: Payer: Self-pay | Admitting: Family Medicine

## 2022-02-02 ENCOUNTER — Other Ambulatory Visit: Payer: Self-pay | Admitting: Internal Medicine

## 2022-02-02 DIAGNOSIS — H905 Unspecified sensorineural hearing loss: Secondary | ICD-10-CM | POA: Diagnosis not present

## 2022-02-07 DIAGNOSIS — H5712 Ocular pain, left eye: Secondary | ICD-10-CM | POA: Diagnosis not present

## 2022-02-09 ENCOUNTER — Telehealth: Payer: Self-pay | Admitting: Family Medicine

## 2022-02-09 NOTE — Telephone Encounter (Signed)
Left message for patient to call back and schedule Medicare Annual Wellness Visit (AWV) either virtually or in office. Left  my Herbie Drape number [phone]  awvi 12/24/21 per [facility]  please schedule at anytime with [facility] Nurse Health Advisor 1 or 2   This should be a 45 minute visit.

## 2022-02-16 DIAGNOSIS — M5416 Radiculopathy, lumbar region: Secondary | ICD-10-CM | POA: Diagnosis not present

## 2022-02-16 DIAGNOSIS — G894 Chronic pain syndrome: Secondary | ICD-10-CM | POA: Diagnosis not present

## 2022-02-21 DIAGNOSIS — H1031 Unspecified acute conjunctivitis, right eye: Secondary | ICD-10-CM | POA: Diagnosis not present

## 2022-03-13 ENCOUNTER — Telehealth: Payer: Self-pay

## 2022-03-13 NOTE — Telephone Encounter (Signed)
Unsuccessful attempt to reach patient on preferred number listed in notes for scheduled AWV. Unable to leave message on voicemail full.
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
## 2022-03-21 DIAGNOSIS — G894 Chronic pain syndrome: Secondary | ICD-10-CM | POA: Diagnosis not present

## 2022-03-21 DIAGNOSIS — M5416 Radiculopathy, lumbar region: Secondary | ICD-10-CM | POA: Diagnosis not present

## 2022-03-22 ENCOUNTER — Telehealth: Payer: Self-pay | Admitting: Family Medicine

## 2022-03-22 NOTE — Telephone Encounter (Signed)
Spoke with patient to schedule Medicare Annual Wellness Visit (AWV) either virtually or in office.  Patient wanted call back week of 04/03/22  awvi 12/24/21 per [facility]  please schedule at anytime with [facility] Nurse Health Advisor 1 or 2   This should be a 45 minute visit.

## 2022-03-31 ENCOUNTER — Telehealth: Payer: Self-pay | Admitting: Internal Medicine

## 2022-03-31 DIAGNOSIS — Z952 Presence of prosthetic heart valve: Secondary | ICD-10-CM

## 2022-03-31 NOTE — Telephone Encounter (Signed)
Ok to order another echo for October, prior to her appt - this would be a 1 year follow-up of TAVR from her last echo  Dr Lemmie Evens

## 2022-03-31 NOTE — Telephone Encounter (Signed)
Patient calling in because she thought dr wanted her to have a procedure done. Was calling to get details. Please advise

## 2022-03-31 NOTE — Telephone Encounter (Signed)
Spoke with patient. She would like to know if she needs another echo, prior to her Oct 2023 appointment. Advised will route to MD to review

## 2022-04-03 NOTE — Telephone Encounter (Signed)
Spoke with patient - notified her that MD ordered echo to be done in October. She will get a call to schedule

## 2022-04-04 ENCOUNTER — Telehealth: Payer: Self-pay | Admitting: Family Medicine

## 2022-04-04 NOTE — Telephone Encounter (Signed)
Spoke with patient to scheduleawvi 12/24/21 per [facility].  Patient stated she is thinking about transferring to [facility] in [place]  since dr Ethlyn Gallery has left.  [place] is closer to her home

## 2022-04-11 ENCOUNTER — Telehealth: Payer: Self-pay | Admitting: Pharmacist

## 2022-04-11 NOTE — Chronic Care Management (AMB) (Signed)
    Chronic Care Management Pharmacy Assistant   Name: [name] [name]  MRN: [id number] DOB: 27-May-1956  Reason for Encounter: Disease State / General Assessment Call   Conditions to be addressed/monitored: HLD, Anxiety, Depression, Osteopenia, and Osteoarthritis  04/11/2022 Spoke with patient, she stated she plans to transfer her primary care to [place], she stated she was unable to talk now and hung up the phone.   04/13/2022 Spoke with patient, she states she plans to transfer to [place] if she can find a provider, if she is unable to find a provider she plans to schedule an appointment with the new provider replacing Dr. Ethlyn Gallery.  She states she is wanting to be tested for diabetes due to some tingling in her feet.  I plan to check back with the patient in 1-2 months and do an assessment call at that time if she remains a patient with [facility].   [place] Pharmacist Assistant [phone]

## 2022-04-18 ENCOUNTER — Other Ambulatory Visit: Payer: Self-pay | Admitting: Physician Assistant

## 2022-04-18 DIAGNOSIS — Z79899 Other long term (current) drug therapy: Secondary | ICD-10-CM | POA: Diagnosis not present

## 2022-04-18 DIAGNOSIS — Z79891 Long term (current) use of opiate analgesic: Secondary | ICD-10-CM | POA: Diagnosis not present

## 2022-04-18 DIAGNOSIS — M5416 Radiculopathy, lumbar region: Secondary | ICD-10-CM | POA: Diagnosis not present

## 2022-04-18 DIAGNOSIS — G894 Chronic pain syndrome: Secondary | ICD-10-CM | POA: Diagnosis not present

## 2022-05-15 DIAGNOSIS — M5106 Intervertebral disc disorders with myelopathy, lumbar region: Secondary | ICD-10-CM | POA: Diagnosis not present

## 2022-05-15 DIAGNOSIS — G894 Chronic pain syndrome: Secondary | ICD-10-CM | POA: Diagnosis not present

## 2022-05-15 DIAGNOSIS — M5416 Radiculopathy, lumbar region: Secondary | ICD-10-CM | POA: Diagnosis not present

## 2022-05-18 ENCOUNTER — Ambulatory Visit (INDEPENDENT_AMBULATORY_CARE_PROVIDER_SITE_OTHER): Payer: Medicare Other | Admitting: Family Medicine

## 2022-05-18 ENCOUNTER — Telehealth: Payer: Self-pay

## 2022-05-18 ENCOUNTER — Encounter: Payer: Self-pay | Admitting: Family Medicine

## 2022-05-18 VITALS — BP 155/90 | HR 85 | Temp 98.0°F | Ht 64.0 in | Wt 161.5 lb

## 2022-05-18 DIAGNOSIS — E538 Deficiency of other specified B group vitamins: Secondary | ICD-10-CM

## 2022-05-18 DIAGNOSIS — F341 Dysthymic disorder: Secondary | ICD-10-CM

## 2022-05-18 DIAGNOSIS — I10 Essential (primary) hypertension: Secondary | ICD-10-CM

## 2022-05-18 MED ORDER — FOLIC ACID 1 MG PO TABS: 1.0000 mg | ORAL_TABLET | Freq: Every day | ORAL | 1 refills | Status: DC

## 2022-05-18 MED ORDER — LOSARTAN POTASSIUM 25 MG PO TABS: 25.0000 mg | ORAL_TABLET | Freq: Every day | ORAL | 1 refills | Status: DC

## 2022-05-18 NOTE — Patient Instructions (Signed)
Start losartan 25 mg daily  Check blood pressure daily - send me your readings over MyChart in about 2 weeks.   Limit salt in the diet.

## 2022-05-18 NOTE — Telephone Encounter (Signed)
--  Caller states that her BP is high. Reports that the last reading she measured was 153/95 last night. Has not checked today. Has been having headaches for months. Is also having dizzy episodes that last about 15 seconds.  05/17/2022 4:06:22 PM See PCP within 24 Hours Velta Addison, RN, Helene Kelp  Comments User: Carles Collet, RN Date/Time Eilene Ghazi Time): 05/17/2022 4:05:08 PM Highest BP has been 166/95  User: Carles Collet, RN Date/Time Eilene Ghazi Time): 05/17/2022 4:07:48 PM Patient does not have headache or dizziness at this time.  Referrals REFERRED TO PCP OFFICE  05/18/22 1030 - Pt states she continues to have intermittent h/a & dizziness. Appt scheduled for today at 4p.

## 2022-05-18 NOTE — Progress Notes (Signed)
Established Patient Office Visit  Subjective   Patient ID: [name] [name], female    DOB: Jun 19, 1956  Age: 66 y.o. MRN: [id number]  Chief Complaint  Patient presents with   Hypertension    Patient states her blood pressure was elevated with reading of 151/101 at the pain management office 3 days    Headache    X2 months   Dizziness    X2 months    Patient is reporting 1-2 month history of increasing blood pressure. She has been checking at home and it is always >140/90. She reports that usually her BP is normal. States that she is having a significant amount of stress emotionally-- PHQ today was very high and we discussed her symptoms. She reports increasing irritability and anger, sadness and lack of motivation to get out of bed. She reports she has an appointment with a psychiatrist in October already to discuss her symptoms. I reviewed her PMH and she has a history of TAVR, last ECHO in 10/22 showed normal EF and grade 1 diastolic dysfunction. She denies any SOB, reports mild ankle swelling. Repeat BP in the office today was 155/90.  Hypertension This is a new problem. The current episode started more than 1 month ago. The problem is unchanged. The problem is uncontrolled. Associated symptoms include headaches. Pertinent negatives include no blurred vision, chest pain, peripheral edema or shortness of breath. Risk factors for coronary artery disease include post-menopausal state.  Dizziness Associated symptoms include headaches. Pertinent negatives include no chest pain or diaphoresis.    Patient Active Problem List   Diagnosis Date Noted   Hypertension 05/18/2022   Chronic cough 01/23/2022   S/P TAVR (transcatheter aortic valve replacement) 06/28/2021   Chronic periodontal disease    Severe aortic stenosis    Hyperlipidemia 03/19/2020   Insomnia 02/21/2012   DEPRESSION/ANXIETY 06/17/2010   Attention deficit disorder 02/02/2009   Osteopenia 10/23/2007   CHRONIC PAIN SYNDROME  09/11/2007   IRRITABLE BOWEL SYNDROME 09/11/2007   Osteoarthritis 09/11/2007      Review of Systems  Constitutional:  Negative for diaphoresis.  Eyes:  Negative for blurred vision.  Respiratory:  Negative for shortness of breath.   Cardiovascular:  Positive for leg swelling. Negative for chest pain.  Neurological:  Positive for dizziness and headaches.  Psychiatric/Behavioral:  Positive for depression. The patient is nervous/anxious.       Objective:     BP (!) 155/90 (BP Location: Right Arm, Patient Position: Sitting, Cuff Size: Normal)   Pulse 85   Temp 98 F (36.7 C) (Oral)   Ht '5\' 4"'$  (1.626 m)   Wt 161 lb 8 oz (73.3 kg)   SpO2 98%   BMI 27.72 kg/m  BP Readings from Last 3 Encounters:  05/19/22 (!) 155/90  01/23/22 136/70  11/18/21 112/80      Physical Exam Vitals reviewed.  Constitutional:      Appearance: Normal appearance. She is well-groomed and normal weight.  HENT:     Head: Normocephalic and atraumatic.  Eyes:     Extraocular Movements: Extraocular movements intact.     Conjunctiva/sclera: Conjunctivae normal.     Pupils: Pupils are equal, round, and reactive to light.  Cardiovascular:     Rate and Rhythm: Normal rate and regular rhythm.     Pulses: Normal pulses.     Heart sounds: S1 normal and S2 normal. No murmur heard. Pulmonary:     Effort: Pulmonary effort is normal.     Breath sounds: Normal breath  sounds and air entry.  Abdominal:     General: Abdomen is flat.  Musculoskeletal:     Cervical back: Normal range of motion and neck supple.     Right lower leg: No edema.     Left lower leg: No edema.  Neurological:     Mental Status: She is alert and oriented to person, place, and time. Mental status is at baseline.     Gait: Gait is intact.  Psychiatric:        Mood and Affect: Affect normal. Mood is anxious.        Speech: Speech normal.        Behavior: Behavior normal.        Judgment: Judgment normal.      No results found for  any visits on 05/18/22.  Last lipids Lab Results  Component Value Date   CHOL 200 07/21/2021   HDL 59.00 07/21/2021   LDLCALC 67 11/03/2020   LDLDIRECT 112.0 07/21/2021   TRIG 214.0 (H) 07/21/2021   CHOLHDL 3 07/21/2021      The 10-year ASCVD risk score (Arnett DK, et al., 2019) is: 11.6%    Assessment & Plan:   Problem List Items Addressed This Visit       Cardiovascular and Mediastinum   Hypertension (Chronic)    Patient reporting home readings as significantly elevated. We had a discussion about treatment options and we will start losartan 25 mg daily at home. She will continue to check BP daily and send me the readings over MyChart in 2-3 weeks. It is likely that her emotional distress/increased irritability is likely contributing to her BP readings. We discussed this and I advised that until her depressive symptoms are well controlled we will continue to treat her HTN with medication. I will see her back in the office in 3 months after she meets with the psychiatrist.      Relevant Medications   losartan (COZAAR) 25 MG tablet     Other   DEPRESSION/ANXIETY - Primary    Severely uncontrolled symptoms, already has an appointment with psychiatry. She has a long history of this and has been on multiple medications in the past. Will defer to the psychiatrist for treatment at this time.      Other Visit Diagnoses     Folate deficiency       Relevant Medications   folic acid (FOLVITE) 1 MG tablet       Return in about 3 months (around 08/18/2022) for HTN follow up.    Farrel Conners, MD

## 2022-05-19 NOTE — Assessment & Plan Note (Signed)
Patient reporting home readings as significantly elevated. We had a discussion about treatment options and we will start losartan 25 mg daily at home. She will continue to check BP daily and send me the readings over MyChart in 2-3 weeks. It is likely that her emotional distress/increased irritability is likely contributing to her BP readings. We discussed this and I advised that until her depressive symptoms are well controlled we will continue to treat her HTN with medication. I will see her back in the office in 3 months after she meets with the psychiatrist.

## 2022-05-19 NOTE — Assessment & Plan Note (Signed)
Severely uncontrolled symptoms, already has an appointment with psychiatry. She has a long history of this and has been on multiple medications in the past. Will defer to the psychiatrist for treatment at this time.

## 2022-05-26 ENCOUNTER — Other Ambulatory Visit: Payer: Self-pay | Admitting: *Deleted

## 2022-05-26 NOTE — Patient Outreach (Signed)
  Care Coordination   05/26/2022 Name: [name] [name] MRN: [id number] DOB: Oct 31, 1955   Care Coordination Outreach Attempts:  An unsuccessful telephone outreach was attempted today to offer the patient information about available care coordination services as a benefit of their health plan.   Follow Up Plan:  Additional outreach attempts will be made to offer the patient care coordination information and services.   Encounter Outcome:  No Answer  Care Coordination Interventions Activated:  No   Care Coordination Interventions:  No, not indicated    Raina Mina, RN Care Management Coordinator [place] Office [phone]

## 2022-06-06 ENCOUNTER — Encounter: Payer: Medicare Other | Admitting: Family Medicine

## 2022-06-12 DIAGNOSIS — G894 Chronic pain syndrome: Secondary | ICD-10-CM | POA: Diagnosis not present

## 2022-06-13 ENCOUNTER — Ambulatory Visit: Payer: Medicare Other

## 2022-06-14 ENCOUNTER — Telehealth: Payer: Self-pay | Admitting: Family Medicine

## 2022-06-14 NOTE — Telephone Encounter (Signed)
Left message asking pt to call [phone].  I wanted to r/s her 06/13/22 AWV appt.

## 2022-06-21 ENCOUNTER — Telehealth: Payer: Self-pay | Admitting: Family Medicine

## 2022-06-21 ENCOUNTER — Telehealth: Payer: Self-pay

## 2022-06-21 NOTE — Telephone Encounter (Signed)
Left message for patient to call back and schedule Medicare Annual Wellness Visit (AWV) either virtually or in office. I left my number for patient to call [phone].   awvi 12/24/21 per ; please schedule at anytime with health coach  This should be a 45 minute visit.

## 2022-06-21 NOTE — Patient Outreach (Signed)
  Care Coordination   Initial Visit Note   06/21/2022 Name: [name] [name] MRN: [id number] DOB: August 18, 1956  [name] [name] is a 66 y.o. year old female who sees Koberlein, Steele Berg, MD (Inactive) for primary care. I spoke with  [name] by phone today.  What matters to the patients health and wellness today?  I am having a lot of stress and depression about my living situation.  I am moving into a new apartment soon and I think my B/P will be better when I move.  My B/P is a little better since I started the medication.  I would like to talk to the LCSW but do not want to talk to the [facility] yet.    Goals Addressed             This Visit's Progress    Care Coordination Activities-wants to decrease stress       Care Coordination Interventions: Advised patient to keep appointments with cardiology, psychiatry and new primary provider Provided education to patient re: care coordination services, Annual Wellness Visit Social Work referral for depression, stress Screening for signs and symptoms of depression related to chronic disease state  Assessed social determinant of health barriers Provided education to patient re: stroke prevention, s/s of heart attack and stroke Advised patient, providing education and rationale, to monitor blood pressure daily and record, calling PCP for findings outside established parameters Declines working with [facility] on hypertension until she sees how her B/P will be after she moves to her new apartment          SDOH assessments and interventions completed:  Yes  SDOH Interventions Today    Flowsheet Row Most Recent Value  SDOH Interventions   Food Insecurity Interventions Intervention Not Indicated  Housing Interventions Intervention Not Indicated  Transportation Interventions Intervention Not Indicated  Utilities Interventions Intervention Not Indicated  Depression Interventions/Treatment  Referral to Psychiatry, Medication, Counseling   [followed by Dr. Toy Care, referred to LCSW for couseling]        Care Coordination Interventions Activated:  Yes  Care Coordination Interventions:  Yes, provided   Follow up plan: Referral made to LCSW for counseling    Encounter Outcome:  Pt. Visit Completed {THN Tip this will not be part of the note when signed-REQUIRED REPORT FIELD DO NOT DELETE (Optional):[id number] Peter Garter RN, BSN,CCM, [place] Management [phone]

## 2022-06-21 NOTE — Patient Instructions (Signed)
Visit Information  Thank you for taking time to visit with me today. Please don't hesitate to contact me if I can be of assistance to you.   Following are the goals we discussed today:   Goals Addressed             This Visit's Progress    Care Coordination Activities-wants to decrease stress       Care Coordination Interventions: Advised patient to keep appointments with cardiology, psychiatry and new primary provider Provided education to patient re: care coordination services, Annual Wellness Visit Social Work referral for depression, stress Screening for signs and symptoms of depression related to chronic disease state  Assessed social determinant of health barriers Provided education to patient re: stroke prevention, s/s of heart attack and stroke Advised patient, providing education and rationale, to monitor blood pressure daily and record, calling PCP for findings outside established parameters Declines working with [facility] on hypertension until she sees how her B/P will be after she moves to her new apartment          Our next appointment is by telephone on 07/03/22 at 1 PM  Please call the care guide team at [phone] if you need to cancel or reschedule your appointment.   If you are experiencing a Mental Health or [place] or need someone to talk to, please call the Suicide and Crisis Lifeline: 988 call the [place] National Suicide Prevention Lifeline: 804-842-7218 or TTY: (661)411-9301 TTY 314-434-6557) to talk to a trained counselor call 1-800-273-TALK (toll free, 24 hour hotline) go to [facility] [address], [place] [phone]) call 911   Patient verbalizes understanding of instructions and care plan provided today and agrees to view in [place]. Active MyChart status and patient understanding of how to access instructions and care plan via MyChart confirmed with patient.     Telephone follow up  appointment with care management team member scheduled for: 07/03/22 at [place], Jackquline Denmark, [place] Management Coordinator [place] Management [phone]

## 2022-06-26 ENCOUNTER — Ambulatory Visit (HOSPITAL_COMMUNITY): Payer: Medicare Other | Attending: Cardiovascular Disease

## 2022-06-26 DIAGNOSIS — Z952 Presence of prosthetic heart valve: Secondary | ICD-10-CM

## 2022-06-26 LAB — ECHOCARDIOGRAM COMPLETE
AR max vel: 1.98 cm2
AV Area VTI: 2.26 cm2
AV Area mean vel: 2.28 cm2
AV Mean grad: 12.5 mmHg
AV Peak grad: 26.8 mmHg
Ao pk vel: 2.59 m/s
Area-P 1/2: 2.62 cm2
S' Lateral: 1.9 cm

## 2022-06-29 ENCOUNTER — Ambulatory Visit (INDEPENDENT_AMBULATORY_CARE_PROVIDER_SITE_OTHER): Payer: Medicare Other | Admitting: Internal Medicine

## 2022-06-29 ENCOUNTER — Encounter (HOSPITAL_BASED_OUTPATIENT_CLINIC_OR_DEPARTMENT_OTHER): Payer: Self-pay | Admitting: Internal Medicine

## 2022-06-29 VITALS — BP 138/80 | HR 54 | Ht 64.0 in | Wt 157.6 lb

## 2022-06-29 DIAGNOSIS — Z952 Presence of prosthetic heart valve: Secondary | ICD-10-CM

## 2022-06-29 DIAGNOSIS — I350 Nonrheumatic aortic (valve) stenosis: Secondary | ICD-10-CM

## 2022-06-29 DIAGNOSIS — E785 Hyperlipidemia, unspecified: Secondary | ICD-10-CM | POA: Diagnosis not present

## 2022-06-29 MED ORDER — ASPIRIN 81 MG PO TBEC: 81.0000 mg | DELAYED_RELEASE_TABLET | Freq: Every day | ORAL | 3 refills | Status: DC

## 2022-06-29 NOTE — Patient Instructions (Signed)
Medication Instructions:  Your physician has recommended you make the following change in your medication:   Start: Aspirin '81mg'$  daily  *If you need a refill on your cardiac medications before your next appointment, please call your pharmacy*   Lab Work: Please return for Lab work within the next week fasting for Lipid Panel and LPa. You may come to the...   [facility] (3rd floor) [address], [place], [place] [address]  Open: 8am-Noon and 1pm-4:30pm  Please ring the doorbell on the small table when you exit the elevator and the Lab Tech will come get you  [place] at [facility] [address] [place], [place], [place] [address] Open: 8am-1pm, then 2pm-4:30pm   [place]- Please see attached locations sheet stapled to your lab work with address and hours.   If you have labs (blood work) drawn today and your tests are completely normal, you will receive your results only by: [place] (if you have MyChart) OR A paper copy in the mail If you have any lab test that is abnormal or we need to change your treatment, we will call you to review the results.  Follow-Up: At [facility], you and your health needs are our priority.  As part of our continuing mission to provide you with exceptional heart care, we have created designated Provider Care Teams.  These Care Teams include your primary Cardiologist (physician) and Advanced Practice Providers (APPs -  Physician Assistants and Nurse Practitioners) who all work together to provide you with the care you need, when you need it.  We recommend signing up for the patient portal called "MyChart".  Sign up information is provided on this After Visit Summary.  MyChart is used to connect with patients for Virtual Visits (Telemedicine).  Patients are able to view lab/test results, encounter notes, upcoming appointments, etc.  Non-urgent messages can be sent to your provider as well.   To  learn more about what you can do with MyChart, go to [email or web address].    Your next appointment:   1 year(s)  The format for your next appointment:   In Person  Provider:   K. Mali Hilty, MD

## 2022-06-29 NOTE — Progress Notes (Addendum)
OFFICE CONSULT NOTE  Chief Complaint:  Dyslipidemia, murmur  Primary Care Physician: [name] Macadam, MD (Inactive)  HPI:  [name] [name] is a 66 y.o. female who is being seen today for the evaluation of dyslipidemia at the request of No ref. provider found.  This is a pleasant 66 year old female kindly referred for evaluation and management of dyslipidemia.  She was sent to the lipid clinic for persistently elevated cholesterol.  More recently in June her total cholesterol is 274, HDL 45, LDL 183 and triglycerides 211.  Hemoglobin A1c had been 5.1.  In the past she has tried statins including atorvastatin which caused her flulike symptoms and Vytorin which caused some unknown side effects.  She is not currently on any therapies.  She does report a significant history of heart disease in her family including her mother (who died at age 56 of heart disease) and her sisters.  She reported a younger brother who had what sounds like an enlarged heart.  She also notes that she has had a murmur since she was a child.  She was told at age 50 that she had a heart murmur which almost kept her from getting a job that she wanted.  Since then she has had very little work-up about this.  According to her PCPs notes recently the patient told her she had a murmur and it apparently was not auscultated.  An echocardiogram was ordered and performed in August.  I personally reviewed these images along with the report which does demonstrate significant calcification of the aortic valve and what appears to possibly be a bicuspid valve with 2 fused raphe.  There is probably moderate aortic stenosis with a mean gradient of 22 mmHg and mild to moderate aortic insufficiency.  When I discussed these echo findings with her today she told me that she was either not told of this or did not recall knowing about the echo findings.  06/29/2022  [name] [name] returns today for follow-up.  She successfully underwent aortic valve  replacement with a TAVR last year in October 2022.  This was a Medtronic evolute pro 26 mm valve.  Subsequently echo showed an acceptable post valve gradient.  A repeat echo was just performed which was hyperdynamic and EF over 75% however she was tachycardic at the time.  Her valve gradient was slightly higher.  She has had no symptoms with it.  She in fact said she was not clearly having any significant symptoms prior to valve replacement but did have what appeared to be a bicuspid valve with severe aortic stenosis.  She was on Plavix and it was advised to transition her to aspirin 81 mg daily back in April however she was not on daily aspirin.  I advised her to start that today.  Otherwise she is due for lipid testing.  She cannot tolerate statins but has had high cholesterol.  Her LDL was 183 in the past and I suspect she might have an elevated LP(a).  This could explain why partially she has early onset aortic valve disease.  PMHx:  Past Medical History:  Diagnosis Date   ABDOMINAL PAIN, LOWER 02/16/2009   Anxiety    Aortic stenosis    APPENDECTOMY, HX OF 09/11/2007   ATTENTION DEFICIT DISORDER, ADULT 02/02/2009   BACK PAIN 07/16/2007   chronic   Back pain    spinal stimulator in place   CERVICAL RADICULOPATHY, RIGHT 07/16/2007   CHEST PAIN, ACUTE 09/03/2008   Chronic abdominal pain  Chronic pain syndrome 37/06/6268   Complication of anesthesia    woke up during surgeries few times and woke up during endoscopy and colonscopy few weeks ago, in terrible pain   DDD (degenerative disc disease), lumbosacral    DEPRESSION/ANXIETY 06/17/2010   Dysuria 02/20/2008   Elevated liver enzymes 1 and  1/2 months ago   Family history of anesthesia complication    mother woke up during surgeries   Fibromyalgia    Functional GI symptoms    FX, RAMUS NOS, CLOSED 07/16/2007   GERD (gastroesophageal reflux disease)    no current problems   H/O failed conscious sedation    PT STATES SHE IS HARD TO  SEDATE!   HEMORRHOIDS 09/11/2007   HIATAL HERNIA, HX OF 09/11/2007   History of kidney stones    passed stones   HX, PERSONAL, MUSCULOSKELETAL DISORD NEC 07/16/2007   HX, PERSONAL, URINARY CALCULI 07/16/2007   Hyperlipidemia    on crestor   Irritable bowel syndrome 09/11/2007   Memory loss    NEOPLASM, SKIN, UNCERTAIN BEHAVIOR 48/54/6270   OSTEOARTHRITIS 09/11/2007   oa   OSTEOPENIA 10/23/2007   Pneumonia    PONV (postoperative nausea and vomiting)    ROTATOR CUFF REPAIR, HX OF 09/11/2007   S/P TAVR (transcatheter aortic valve replacement) 06/28/2021   s/p TAVR with 102m Evolut Pro + via the TF approach by Dr. MAngelena Formand Dr. BCyndia Bent  Stroke ([facility]    mini stroke "years ago" per patient   Suicidal ideations    SYMPTOM, PAIN, ABDOMINAL, RIGHT UP QUADRANT 07/16/2007   TAH/BSO, HX OF 09/11/2007   UTI 10/06/2010   VAGINITIS, ATROPHIC, POSTMENOPAUSAL 07/16/2007    Past Surgical History:  Procedure Laterality Date   ABDOMINAL HYSTERECTOMY  1976   removed due to abnormal paps; no uterine cancer   [place]  bone pushed back into  place after fracture Left 25-30 yrs ago   face   CARDIAC CATHETERIZATION     cervical radiculopathy  20 yrs ago   RT   CHOLECYSTECTOMY N/A 02/18/2014   Procedure: LAPAROSCOPIC CHOLECYSTECTOMY;  Surgeon: DHarl Bowie MD;  Location: [facility];  Service: General;  Laterality: N/A;   COLONOSCOPY     DILATION AND CURETTAGE OF UTERUS  age 89  ESOPHAGOGASTRODUODENOSCOPY     MULTIPLE EXTRACTIONS WITH ALVEOLOPLASTY N/A 05/26/2021   Procedure: MULTIPLE EXTRACTION WITH ALVEOLOPLASTY;  Surgeon: OCharlaine Dalton DMD;  [place]  Service: Dentistry;  Laterality: N/A;   ORIF ANKLE FRACTURE Left 07/12/2017   Procedure: OPEN REDUCTION INTERNAL FIXATION (ORIF) LEFT ANKLE FRACTURE;  Surgeon: HCarole Civil MD;  Location: [facility];  Service: Orthopedics;  Laterality: Left;   RIGHT/LEFT HEART CATH AND CORONARY  ANGIOGRAPHY N/A 02/03/2021   Procedure: RIGHT/LEFT HEART CATH AND CORONARY ANGIOGRAPHY;  Surgeon: MBurnell Blanks MD;  [facility];  Service: Cardiovascular;  Laterality: N/A;   [place]  right   SPINAL CORD STIMULATOR IMPLANT  2005   lower back, pt turned off 2-3 weeks ago due to causing pain   TEE WITHOUT CARDIOVERSION N/A 06/28/2021   Procedure: TRANSESOPHAGEAL ECHOCARDIOGRAM (TEE);  Surgeon: MBurnell Blanks MD;  [place] [facility]  Service: Open Heart Surgery;  Laterality: N/A;   TRANSCATHETER AORTIC VALVE REPLACEMENT, TRANSFEMORAL N/A 06/28/2021   Procedure: TRANSCATHETER AORTIC VALVE REPLACEMENT, TRANSFEMORAL USING A 26MM MEDTRONIC AORTIC VALVE.;  Surgeon: MBurnell Blanks MD;  [facility]  Service: Open Heart Surgery;  Laterality: N/A;   TUBAL LIGATION      FAMHx:  Family History  Problem Relation Age of Onset   Heart disease Mother        CAD/ valvular disease   Kidney disease Mother        lost a kidney -  unknown cause   COPD Mother    High Cholesterol Brother        CAD   High blood pressure Brother    COPD Father    Heart disease Father 21       MI   Heart disease Sister 8   Other Sister        complications from pain medications   Emphysema Sister    Cancer Other        Colon   Heart disease Maternal Aunt    Heart disease Maternal Grandmother    Colon cancer Cousin 19       paternal   Stroke Maternal Grandfather    Alcoholism Brother    Brain cancer Maternal Aunt     SOCHx:   reports that she has never smoked. She has never used smokeless tobacco. She reports that she does not drink alcohol and does not use drugs.  ALLERGIES:  Allergies  Allergen Reactions   Ciprofloxacin Itching and Palpitations    migraine   Macrobid [Nitrofurantoin Macrocrystal] Other (See Comments)    Body aches and Migraine headache   Ampicillin Other (See Comments)    Told by md not to take ampilciian due to  penicillin allergy   Atorvastatin Other (See Comments)    Felt like had flu   Buspirone Hcl Other (See Comments)    REACTION: intolerance   Ezetimibe-Simvastatin Other (See Comments)    Unknown   Latex Other (See Comments)    Redness and burning   Lyrica [Pregabalin] Other (See Comments)    Caused swelling in legs and hands   Morphine And Related Itching    Not be a nice person   Penicillins Other (See Comments)    Fever and itching   Sulfa Antibiotics Nausea Only    Tolerate sometimes    ROS: Pertinent items noted in HPI and remainder of comprehensive ROS otherwise negative.  HOME MEDS: Current Outpatient Medications on File Prior to Visit  Medication Sig Dispense Refill   acyclovir ointment (ZOVIRAX) 5 % Apply 1 application topically every 4 (four) hours as needed. (Patient taking differently: Apply 1 application  topically every 4 (four) hours as needed (mouth sores).) 15 g 0   ALPRAZolam (XANAX) 1 MG tablet Take 0.5-1 mg by mouth at bedtime as needed for sleep.     amphetamine-dextroamphetamine (ADDERALL) 30 MG tablet Take 30 mg by mouth daily.     ARIPiprazole (ABILIFY PO) Take 2 mg by mouth at bedtime.     Chlorpheniramine-Phenylephrine 4-10 MG tablet Take 1 tablet by mouth at bedtime. 30 tablet 0   Cyanocobalamin (VITAMIN B-12) 1000 MCG SUBL Place 1 tablet (1,000 mcg total) under the tongue daily. 90 tablet 1   cyclobenzaprine (FLEXERIL) 10 MG tablet Take 10 mg by mouth 3 (three) times daily as needed for muscle spasms.     diclofenac Sodium (VOLTAREN) 1 % GEL Apply 2 g topically 3 (three) times daily as needed (pain).     folic acid (FOLVITE) 1 MG tablet Take 1 tablet (1 mg total) by mouth daily. 90 tablet 1   gabapentin (NEURONTIN) 300 MG capsule Take 300 mg by  mouth as needed.     Heating Pad PADS Use 3 times weekly 12 each 0   losartan (COZAAR) 25 MG tablet Take 1 tablet (25 mg total) by mouth daily. 90 tablet 1   ondansetron (ZOFRAN-ODT) 4 MG disintegrating tablet  DISSOLVE 1 TABLET BY MOUTH EVERY 8 HOURS AS NEEDED FOR NAUSEA 90 tablet 0   Oxycodone HCl 10 MG TABS Take 10 mg by mouth 4 (four) times daily as needed (pain).     promethazine (PHENERGAN) 25 MG suppository INSERT 1 SUPPOSITORY RECTALLY EVERY 8 HOURS AS NEEDED. 10 suppository 0   Vilazodone HCl (VIIBRYD) 40 MG TABS TAKE (1) TABLET BY MOUTH ONCE DAILY WITH FULL MEAL. 90 tablet 1   No current facility-administered medications on file prior to visit.    LABS/IMAGING: No results found for this or any previous visit (from the past 48 hour(s)). No results found.  LIPID PANEL:    Component Value Date/Time   CHOL 200 07/21/2021 1121   CHOL 151 11/03/2020 1546   TRIG 214.0 (H) 07/21/2021 1121   HDL 59.00 07/21/2021 1121   HDL 60 11/03/2020 1546   CHOLHDL 3 07/21/2021 1121   VLDL 42.8 (H) 07/21/2021 1121   LDLCALC 67 11/03/2020 1546   LDLDIRECT 112.0 07/21/2021 1121    WEIGHTS: Wt Readings from Last 3 Encounters:  06/29/22 157 lb 9.6 oz (71.5 kg)  05/18/22 161 lb 8 oz (73.3 kg)  01/23/22 167 lb 9.6 oz (76 kg)    VITALS: BP 138/80 (BP Location: Left Arm, Patient Position: Sitting, Cuff Size: Normal)   Pulse (!) 54   Ht '5\' 4"'$  (1.626 m)   Wt 157 lb 9.6 oz (71.5 kg)   BMI 27.05 kg/m   EXAM: General appearance: alert and no distress Neck: no carotid bruit, no JVD, and thyroid not enlarged, symmetric, no tenderness/mass/nodules Lungs: clear to auscultation bilaterally Heart: regular rate and rhythm, S1, S2 normal, and systolic murmur: early systolic 2/6, crescendo at 2nd right intercostal space Abdomen: soft, non-tender; bowel sounds normal; no masses,  no organomegaly Extremities: extremities normal, atraumatic, no cyanosis or edema Pulses: 2+ and symmetric Skin: Skin color, texture, turgor normal. No rashes or lesions Neurologic: Grossly normal Psych: Pleasant  EKG: Normal sinus rhythm at 84-personally reviewed  ASSESSMENT: Mixed dyslipidemia Possible statin  intolerance-myalgias Aortic stenosis -status post 26 mm evolute pro valve (06/2021)- has NYHA class I symptoms.    PLAN: 1.   Ms. Tercero has done well status post valve replacement.  She was advised to start aspirin 81 mg daily which was recommended by the valve clinic however she was confused.  She did stop her Plavix as recommended back in April.  I also recommended a repeat of her lipids including an LP(a) because of high cholesterol in the past.  She has been statin intolerant and may be a good candidate for PCSK9 inhibitor.  She was advised to use dental prophylaxis as instructed by the valve clinic.  I will contact her with her lipid results and any new therapies if necessary.  Plan otherwise follow-up with me annually or sooner as necessary.  Pixie Casino, MD, [facility], [place] Director of the Advanced Lipid Disorders &  Cardiovascular Risk Reduction Clinic Diplomate of the American Board of Clinical Lipidology Attending Cardiologist  Direct Dial: [phone]  Fax: [phone]  Website:  [email or web address] Boxell 06/29/2022, 4:27 PM

## 2022-06-30 NOTE — Addendum Note (Signed)
Addended by: Gerald Stabs on: 06/30/2022 07:32 AM   Modules accepted: Orders

## 2022-07-03 ENCOUNTER — Ambulatory Visit: Payer: Self-pay | Admitting: Licensed Clinical Social Worker

## 2022-07-05 NOTE — Patient Outreach (Signed)
  Care Coordination   Initial Visit Note   07/05/2022 Name: [name] [name] MRN: [id number] DOB: 06-07-1956  [name] [name] is a 66 y.o. year old female who sees Koberlein, Steele Berg, MD (Inactive) for primary care. I spoke with  [name] [name] by phone today.  What matters to the patients health and wellness today?  Stress management    Goals Addressed             This Visit's Progress    LCSW Plan of Care-Management of Stress   On track    Care Coordination Interventions: Solution-Focused Strategies employed:  Mindfulness or Relaxation training provided Active listening / Reflection utilized  Emotional Support Provided Verbalization of feelings encouraged  Pt endorses ongoing stress symptoms triggered by housing situation. This has negatively impacted pt's blood pressure and chronic pain (back/feet) Pt is scheduled to move 07/04/22 into independent housing LCSW assessed for SI/HI, which pt denies LCSW provided pt with crisis intervention resources over phone LCSW discussed stress management strategies to assist with symptom management. Pt identified support systems and healthy coping skills (crafting, spending time with dog, and meditating) LCSW commended pt for establishing boundaries and advocating for self with friends and family Pt is interested in being tested for Lupus, per recommendation from Dentist, due to dry mouth            SDOH assessments and interventions completed:  No     Care Coordination Interventions Activated:  Yes  Care Coordination Interventions:  Yes, provided   Follow up plan: Follow up call scheduled for 07/18/22    Encounter Outcome:  Pt. Visit Completed   Christa See, MSW, [email or web address] Phone [phone] 10:06 AM

## 2022-07-05 NOTE — Patient Instructions (Signed)
Visit Information  Thank you for taking time to visit with me today. Please don't hesitate to contact me if I can be of assistance to you.   Following are the goals we discussed today:   Goals Addressed             This Visit's Progress    LCSW Plan of Care-Management of Stress   On track    Care Coordination Interventions: Solution-Focused Strategies employed:  Mindfulness or Relaxation training provided Active listening / Reflection utilized  Emotional Support Provided Verbalization of feelings encouraged  Pt endorses ongoing stress symptoms triggered by housing situation. This has negatively impacted pt's blood pressure and chronic pain (back/feet) Pt is scheduled to move 07/04/22 into independent housing LCSW assessed for SI/HI, which pt denies LCSW provided pt with crisis intervention resources over phone LCSW discussed stress management strategies to assist with symptom management. Pt identified support systems and healthy coping skills (crafting, spending time with dog, and meditating) LCSW commended pt for establishing boundaries and advocating for self with friends and family Pt is interested in being tested for Lupus, per recommendation from Dentist, due to dry mouth            Our next appointment is by telephone on 07/18/22 at 1 PM  Please call the care guide team at [phone] if you need to cancel or reschedule your appointment.   If you are experiencing a Mental Health or [place] or need someone to talk to, please call the Suicide and Crisis Lifeline: 988 call 911   Patient verbalizes understanding of instructions and care plan provided today and agrees to view in [place]. Active MyChart status and patient understanding of how to access instructions and care plan via MyChart confirmed with patient.     Christa See, MSW, [email or web address] Phone [phone]  [phone] 10:07 AM

## 2022-07-06 ENCOUNTER — Other Ambulatory Visit: Payer: Self-pay | Admitting: *Deleted

## 2022-07-06 DIAGNOSIS — Z952 Presence of prosthetic heart valve: Secondary | ICD-10-CM

## 2022-07-17 ENCOUNTER — Encounter: Payer: Self-pay | Admitting: Family Medicine

## 2022-07-17 ENCOUNTER — Ambulatory Visit (INDEPENDENT_AMBULATORY_CARE_PROVIDER_SITE_OTHER): Payer: Medicare Other | Admitting: Family Medicine

## 2022-07-17 VITALS — BP 122/80 | HR 82 | Temp 97.8°F | Ht 64.0 in | Wt 156.5 lb

## 2022-07-17 DIAGNOSIS — R682 Dry mouth, unspecified: Secondary | ICD-10-CM

## 2022-07-17 DIAGNOSIS — H04123 Dry eye syndrome of bilateral lacrimal glands: Secondary | ICD-10-CM

## 2022-07-17 DIAGNOSIS — G894 Chronic pain syndrome: Secondary | ICD-10-CM | POA: Diagnosis not present

## 2022-07-17 DIAGNOSIS — I10 Essential (primary) hypertension: Secondary | ICD-10-CM

## 2022-07-17 DIAGNOSIS — E162 Hypoglycemia, unspecified: Secondary | ICD-10-CM | POA: Diagnosis not present

## 2022-07-17 DIAGNOSIS — R059 Cough, unspecified: Secondary | ICD-10-CM | POA: Diagnosis not present

## 2022-07-17 DIAGNOSIS — M5416 Radiculopathy, lumbar region: Secondary | ICD-10-CM | POA: Diagnosis not present

## 2022-07-17 DIAGNOSIS — R0989 Other specified symptoms and signs involving the circulatory and respiratory systems: Secondary | ICD-10-CM

## 2022-07-17 DIAGNOSIS — R053 Chronic cough: Secondary | ICD-10-CM | POA: Diagnosis not present

## 2022-07-17 LAB — POCT GLYCOSYLATED HEMOGLOBIN (HGB A1C): Hemoglobin A1C: 5.4 % (ref 4.0–5.6)

## 2022-07-17 MED ORDER — LOSARTAN POTASSIUM 50 MG PO TABS: 50.0000 mg | ORAL_TABLET | Freq: Every day | ORAL | 1 refills | Status: DC

## 2022-07-17 NOTE — Assessment & Plan Note (Addendum)
Pt would like a work up for autoimmune disease, we discussed this briefly and I will see her back in 1 month after her BP is better controlled.

## 2022-07-17 NOTE — Progress Notes (Signed)
Established Patient Office Visit  Subjective   Patient ID: [name] [name], female    DOB: 01-11-1956  Age: 66 y.o. MRN: [id number]  Chief Complaint  Patient presents with   Fever    Temperature of 99 degrees 2 days ago   Cough    Non-productive x3-4 days   Headache    X2-3 months   Dizziness    Patient is reporting some "lung pain" for a few days. Thought she might have some bronchitis initially, states that it is a little harder to get her breath. Pt reports subjective fever 2 days prior. Also her coughing is chronic, has been going on for the last 3-4 years.   Patient reports that she has been having headaches for the last 2-3 months. States that the dizzy spells are longer lasting. States she has been checking her blood pressure at home and it was still high even on the losartan 25 mg daily. States that she is still having headaches as well, thinks these are related to her blood pressure stlil being elevated. BP was performed in office and is WNL today.  Dizziness-- patient continues to have dizzy spells. States they only last for about 10 seconds and they go away. States that she has ben lifting boxes and moving. I asked if she might have gotten dehydrated and she reports it could be a possibility.   Fever  Associated symptoms include coughing and headaches.  Cough Associated symptoms include a fever and headaches.  Headache  Associated symptoms include coughing, dizziness and a fever.  Dizziness Associated symptoms include coughing, a fever and headaches.   Current Outpatient Medications  Medication Instructions   acyclovir ointment (ZOVIRAX) 5 % 1 application , Topical, Every 4 hours PRN   ALPRAZolam (XANAX) 0.5-1 mg, Oral, At bedtime PRN   amphetamine-dextroamphetamine (ADDERALL) 30 MG tablet 30 mg, Oral, Daily   ARIPiprazole (ABILIFY PO) 2 mg, Oral, Nightly   aspirin EC 81 mg, Oral, Daily, Swallow whole.   Chlorpheniramine-Phenylephrine 4-10 MG tablet 1 tablet, Oral,  Daily at bedtime   cyclobenzaprine (FLEXERIL) 10 mg, Oral, 3 times daily PRN   diclofenac Sodium (VOLTAREN) 2 g, Topical, 3 times daily PRN   folic acid (FOLVITE) 1 mg, Oral, Daily   gabapentin (NEURONTIN) 300 mg, Oral, As needed   Heating Pad PADS Use 3 times weekly   losartan (COZAAR) 50 mg, Oral, Daily   ondansetron (ZOFRAN-ODT) 4 MG disintegrating tablet DISSOLVE 1 TABLET BY MOUTH EVERY 8 HOURS AS NEEDED FOR NAUSEA   Oxycodone HCl 10 mg, Oral, 4 times daily PRN   promethazine (PHENERGAN) 25 MG suppository INSERT 1 SUPPOSITORY RECTALLY EVERY 8 HOURS AS NEEDED.   Vilazodone HCl (VIIBRYD) 40 MG TABS TAKE (1) TABLET BY MOUTH ONCE DAILY WITH FULL MEAL.   Vitamin B-12 1,000 mcg, Sublingual, Daily       Review of Systems  Constitutional:  Positive for fever.  Respiratory:  Positive for cough.   Neurological:  Positive for dizziness and headaches.  All other systems reviewed and are negative.     Objective:     BP 122/80 (BP Location: Left Arm, Patient Position: Sitting, Cuff Size: Normal)   Pulse 82   Temp 97.8 F (36.6 C) (Oral)   Ht '5\' 4"'$  (1.626 m)   Wt 156 lb 8 oz (71 kg)   SpO2 98%   BMI 26.86 kg/m    Physical Exam Vitals reviewed.  Constitutional:      Appearance: Normal appearance. She is well-groomed  and normal weight.  Eyes:     Conjunctiva/sclera: Conjunctivae normal.  Neck:     Thyroid: No thyromegaly.  Cardiovascular:     Rate and Rhythm: Normal rate and regular rhythm.     Pulses: Normal pulses.     Heart sounds: S1 normal and S2 normal.  Pulmonary:     Effort: Pulmonary effort is normal.     Breath sounds: Normal breath sounds and air entry.  Abdominal:     General: Bowel sounds are normal.  Musculoskeletal:     Right lower leg: No edema.     Left lower leg: No edema.  Neurological:     Mental Status: She is alert and oriented to person, place, and time. Mental status is at baseline.     Gait: Gait is intact.  Psychiatric:        Mood and  Affect: Mood and affect normal.        Speech: Speech normal.        Behavior: Behavior normal.        Judgment: Judgment normal.      Results for orders placed or performed in visit on 07/17/22  POC HgB A1c  Result Value Ref Range   Hemoglobin A1C 5.4 4.0 - 5.6 %   HbA1c POC (<> result, manual entry)     HbA1c, POC (prediabetic range)     HbA1c, POC (controlled diabetic range)        The 10-year ASCVD risk score (Arnett DK, et al., 2019) is: 7.4%    Assessment & Plan:   Problem List Items Addressed This Visit       Cardiovascular and Mediastinum   Hypertension (Chronic)    Pt is reporting continued high pressures at home, however her BP here in the office is WNL, will increase her losartan to 50 mg daily. I will see her back next month for a BP recheck.      Relevant Medications   losartan (COZAAR) 50 MG tablet     Digestive   Dry mouth and eyes    Pt would like a work up for autoimmune disease, we discussed this briefly and I will see her back in 1 month after her BP is better controlled.         Other   Chronic cough    Chronic in nature, she may have an acute URI currently, her lungs are clear on exam. COVID and flu are negative. Most likely this represents chronic post nasal drip, COVID and flu are negative and lungs are clear on exam. Reassured patient. Will follow up in 1 month.      Other Visit Diagnoses     Cough, unspecified type    -  Primary   Relevant Orders   POC COVID-19 BinaxNow   POC Influenza A/B   Chest congestion       Relevant Orders   POC COVID-19 BinaxNow   POC Influenza A/B   Hypoglycemia       Relevant Orders   POC HgB A1c (Completed)       Return in about 1 month (around 08/17/2022) for follow up blood pressure.    Farrel Conners, MD

## 2022-07-18 ENCOUNTER — Ambulatory Visit: Payer: Self-pay | Admitting: Licensed Clinical Social Worker

## 2022-07-18 LAB — POCT INFLUENZA A/B
Influenza A, POC: NEGATIVE
Influenza B, POC: NEGATIVE

## 2022-07-18 LAB — POC COVID19 BINAXNOW: SARS Coronavirus 2 Ag: NEGATIVE

## 2022-07-18 NOTE — Assessment & Plan Note (Signed)
Pt is reporting continued high pressures at home, however her BP here in the office is WNL, will increase her losartan to 50 mg daily. I will see her back next month for a BP recheck.

## 2022-07-18 NOTE — Assessment & Plan Note (Addendum)
Chronic in nature, she may have an acute URI currently, her lungs are clear on exam. COVID and flu are negative. Most likely this represents chronic post nasal drip, COVID and flu are negative and lungs are clear on exam. Reassured patient. Will follow up in 1 month.

## 2022-07-26 NOTE — Patient Outreach (Signed)
  Care Coordination   Follow Up Visit Note   07/26/2022 Name: [name] [name] MRN: [id number] DOB: 1955/11/08  [name] [name] is a 66 y.o. year old female who sees Farrel Conners, MD for primary care. I spoke with  [name] by phone today.  What matters to the patients health and wellness today?  Sleep hygiene    Goals Addressed             This Visit's Progress    LCSW Plan of Care-Management of Stress   On track    Care Coordination Interventions: Solution-Focused Strategies employed:  Mindfulness or Relaxation training provided Active listening / Reflection utilized  Emotional Support Provided Verbalization of feelings encouraged  Believes she has a kidney or bladder infection "something is going on" Endorses a really bad odor and burning when she urinates. Concerned about how an infection may negatively impact valve replacement Pt is open to providing urine cultures to local [facility] Pt endorses nightmares have returned after staying at residence alone. Pt could not identify triggers. LCSW discussed how stress can negatively impact our physical and mental health, in addition, to strategies to assist in management LCSW contacted PCP office and relayed information that pt would need to complete another visit with PCP addressing concerns, prior to ordering labs. LCSW reminded pt that she can use schedule virtual md appts via mychart or visit a local urgent care. Pt agreed to contact PCP office if symptoms worsens LCSW discussed strategies to improve sleep hygiene             SDOH assessments and interventions completed:  No     Care Coordination Interventions Activated:  Yes  Care Coordination Interventions:  Yes, provided   Follow up plan: Follow up call scheduled for 08/15/22    Encounter Outcome:  Pt. Visit Completed   Christa See, MSW, [email or web address] Phone [phone] 5:18 AM

## 2022-07-26 NOTE — Patient Instructions (Signed)
Visit Information  Thank you for taking time to visit with me today. Please don't hesitate to contact me if I can be of assistance to you.   Following are the goals we discussed today:   Goals Addressed             This Visit's Progress    LCSW Plan of Care-Management of Stress   On track    Care Coordination Interventions: Solution-Focused Strategies employed:  Mindfulness or Relaxation training provided Active listening / Reflection utilized  Emotional Support Provided Verbalization of feelings encouraged  Believes she has a kidney or bladder infection "something is going on" Endorses a really bad odor and burning when she urinates. Concerned about how an infection may negatively impact valve replacement Pt is open to providing urine cultures to local [facility] Pt endorses nightmares have returned after staying at residence alone. Pt could not identify triggers. LCSW discussed how stress can negatively impact our physical and mental health, in addition, to strategies to assist in management LCSW contacted PCP office and relayed information that pt would need to complete another visit with PCP addressing concerns, prior to ordering labs. LCSW reminded pt that she can use schedule virtual md appts via mychart or visit a local urgent care. Pt agreed to contact PCP office if symptoms worsens LCSW discussed strategies to improve sleep hygiene             Our next appointment is by telephone on 08/15/22 at 11 AM  Please call the care guide team at [phone] if you need to cancel or reschedule your appointment.   If you are experiencing a Mental Health or [place] or need someone to talk to, please call the Suicide and Crisis Lifeline: 988 call 911   Patient verbalizes understanding of instructions and care plan provided today and agrees to view in [facility]. Active MyChart status and patient understanding of how to access instructions and care plan via MyChart  confirmed with patient.     Christa See, MSW, [email or web address] Phone [phone] 5:18 AM

## 2022-08-10 ENCOUNTER — Ambulatory Visit (INDEPENDENT_AMBULATORY_CARE_PROVIDER_SITE_OTHER): Payer: Medicare Other | Admitting: Family Medicine

## 2022-08-10 ENCOUNTER — Encounter: Payer: Self-pay | Admitting: Family Medicine

## 2022-08-10 VITALS — BP 118/88 | HR 77 | Temp 97.9°F | Ht 64.0 in | Wt 155.4 lb

## 2022-08-10 DIAGNOSIS — R32 Unspecified urinary incontinence: Secondary | ICD-10-CM

## 2022-08-10 DIAGNOSIS — R8290 Unspecified abnormal findings in urine: Secondary | ICD-10-CM | POA: Diagnosis not present

## 2022-08-10 DIAGNOSIS — H04123 Dry eye syndrome of bilateral lacrimal glands: Secondary | ICD-10-CM | POA: Diagnosis not present

## 2022-08-10 DIAGNOSIS — R682 Dry mouth, unspecified: Secondary | ICD-10-CM | POA: Diagnosis not present

## 2022-08-10 DIAGNOSIS — R42 Dizziness and giddiness: Secondary | ICD-10-CM | POA: Diagnosis not present

## 2022-08-10 DIAGNOSIS — H532 Diplopia: Secondary | ICD-10-CM | POA: Diagnosis not present

## 2022-08-10 LAB — POC URINALSYSI DIPSTICK (AUTOMATED)
Bilirubin, UA: NEGATIVE
Blood, UA: NEGATIVE
Glucose, UA: NEGATIVE
Ketones, UA: NEGATIVE
Leukocytes, UA: NEGATIVE
Nitrite, UA: NEGATIVE
Protein, UA: POSITIVE — AB
Spec Grav, UA: >=1.03 — AB (ref 1.010–1.025)
Urobilinogen, UA: 0.2 E.U./dL
pH, UA: 5.5 (ref 5.0–8.0)

## 2022-08-10 MED ORDER — SULFAMETHOXAZOLE-TRIMETHOPRIM 800-160 MG PO TABS: 1.0000 | ORAL_TABLET | Freq: Two times a day (BID) | ORAL | 0 refills | Status: AC

## 2022-08-10 NOTE — Progress Notes (Signed)
Established Patient Office Visit  Subjective   Patient ID: [name] [name], female    DOB: 1955-10-08  Age: 66 y.o. MRN: [id number]  Chief Complaint  Patient presents with   Dysuria    Patient complains of urinary incontinence and abnormal odor of urine since the last visit   Blurred Vision    Patient states she noticed one episode of double vision and dizziness yesterday while at [facility]    Patient is here for new complaints and follow up. She is reporting an episode of dizziness and vertigo, no passing out, no nausea or vomiting. States that it happened when she was at the [employer] walking around. States that it last longer than her usual episodes, states that it lasted a few minutes this time. She was also seeing double. States that she has never seen   Dry mouth- pt reports that her dentist told her that her mouth of extremely dry, states that he told her to be tested for lupus due to the dry mouth.  Patient reports that she is having increasing urinary frequency and dysuria. States that the burning pain is occasional, states that her lower back hurts and her urine is foul smelling. States that she is also having some bladder pain. Denies fever/chills. No nausea or vomiting.    Current Outpatient Medications  Medication Instructions   acyclovir ointment (ZOVIRAX) 5 % 1 application , Topical, Every 4 hours PRN   ALPRAZolam (XANAX) 0.5-1 mg, Oral, At bedtime PRN   amphetamine-dextroamphetamine (ADDERALL) 30 MG tablet 30 mg, Oral, Daily   ARIPiprazole (ABILIFY PO) 2 mg, Oral, Nightly   aspirin EC 81 mg, Oral, Daily, Swallow whole.   Chlorpheniramine-Phenylephrine 4-10 MG tablet 1 tablet, Oral, Daily at bedtime   cyclobenzaprine (FLEXERIL) 10 mg, Oral, 3 times daily PRN   diclofenac Sodium (VOLTAREN) 2 g, Topical, 3 times daily PRN   folic acid (FOLVITE) 1 mg, Oral, Daily   gabapentin (NEURONTIN) 300 mg, Oral, As needed   Heating Pad PADS Use 3 times weekly   losartan (COZAAR) 50  mg, Oral, Daily   ondansetron (ZOFRAN-ODT) 4 MG disintegrating tablet DISSOLVE 1 TABLET BY MOUTH EVERY 8 HOURS AS NEEDED FOR NAUSEA   Oxycodone HCl 10 mg, Oral, 4 times daily PRN   promethazine (PHENERGAN) 25 MG suppository INSERT 1 SUPPOSITORY RECTALLY EVERY 8 HOURS AS NEEDED.   sulfamethoxazole-trimethoprim (BACTRIM DS) 800-160 MG tablet 1 tablet, Oral, 2 times daily   Vilazodone HCl (VIIBRYD) 40 MG TABS TAKE (1) TABLET BY MOUTH ONCE DAILY WITH FULL MEAL.   Vitamin B-12 1,000 mcg, Sublingual, Daily     Patient Active Problem List   Diagnosis Date Noted   Dry mouth and eyes 07/17/2022   Hypertension 05/18/2022   Chronic cough 01/23/2022   S/P TAVR (transcatheter aortic valve replacement) 06/28/2021   Chronic periodontal disease    Severe aortic stenosis    Hyperlipidemia 03/19/2020   Insomnia 02/21/2012   DEPRESSION/ANXIETY 06/17/2010   Attention deficit disorder 02/02/2009   Osteopenia 10/23/2007   CHRONIC PAIN SYNDROME 09/11/2007   IRRITABLE BOWEL SYNDROME 09/11/2007   Osteoarthritis 09/11/2007      ROS    Objective:     BP 118/88 (BP Location: Left Arm, Patient Position: Sitting, Cuff Size: Normal)   Pulse 77   Temp 97.9 F (36.6 C) (Oral)   Ht '5\' 4"'$  (1.626 m)   Wt 155 lb 6.4 oz (70.5 kg)   SpO2 99%   BMI 26.67 kg/m  BP Readings from Last  3 Encounters:  08/10/22 118/88  07/17/22 122/80  06/29/22 138/80   Wt Readings from Last 3 Encounters:  08/10/22 155 lb 6.4 oz (70.5 kg)  07/17/22 156 lb 8 oz (71 kg)  06/29/22 157 lb 9.6 oz (71.5 kg)      Physical Exam   Results for orders placed or performed in visit on 08/10/22  POCT Urinalysis Dipstick (Automated)  Result Value Ref Range   Color, UA yellow    Clarity, UA clear    Glucose, UA Negative Negative   Bilirubin, UA negative    Ketones, UA negative    Spec Grav, UA >=1.030 (A) 1.010 - 1.025   Blood, UA negative    pH, UA 5.5 5.0 - 8.0   Protein, UA Positive (A) Negative   Urobilinogen, UA 0.2 0.2  or 1.0 E.U./dL   Nitrite, UA negative    Leukocytes, UA Negative Negative      The 10-year ASCVD risk score (Arnett DK, et al., 2019) is: 6.9%  Urine dipstick shows negative for all components, positive for protein.    Assessment & Plan:   Problem List Items Addressed This Visit       Unprioritized   Dry mouth and eyes   Relevant Orders   Pt requesting work up to rule out autoimmune diseases, will start with a lupus profile.  Systemic Lupus Profile A   Other Visit Diagnoses     Abnormal urine odor    -  Primary   Relevant Medications   sulfamethoxazole-trimethoprim (BACTRIM DS) 800-160 MG tablet   Other Relevant Orders   UA is not strongly positive for infection, she does have evidence of dehydration (increased specific gravity). I recommended increasing fluids. Will send her urine for culture to confirm. Her urinary sx may be indicative of infection, will treat with a short course of bactrim until her urine culture has resulted.   POCT Urinalysis Dipstick (Automated) (Completed)   Culture, Urine   Diplopia       Relevant Orders   CT HEAD WO CONTRAST (5MM)   Dizzinesses       Relevant Orders   Unclear etiology, patient is on several medications that can cause dizziness and diplopia. ECHO performed in October 2023 which showed grade 1 diastolic dysfunction and hyperdynamic LV. Her TAVR and shows no aortic stenosis or regurgitation. Patient has not had imaging of her head to rule out neurologic causes of her diplopia and dizziness. Will order noncontrasted CT head. Most likely I believe her symptoms are medication related.   CT HEAD WO CONTRAST (5MM)       Return in about 4 months (around 12/09/2022) for Annual physical exam.    Farrel Conners, MD

## 2022-08-14 ENCOUNTER — Other Ambulatory Visit: Payer: Medicare Other

## 2022-08-14 ENCOUNTER — Telehealth: Payer: Self-pay | Admitting: Pharmacist

## 2022-08-14 DIAGNOSIS — H04123 Dry eye syndrome of bilateral lacrimal glands: Secondary | ICD-10-CM

## 2022-08-14 DIAGNOSIS — M5416 Radiculopathy, lumbar region: Secondary | ICD-10-CM | POA: Diagnosis not present

## 2022-08-14 DIAGNOSIS — R682 Dry mouth, unspecified: Secondary | ICD-10-CM | POA: Diagnosis not present

## 2022-08-14 DIAGNOSIS — I10 Essential (primary) hypertension: Secondary | ICD-10-CM | POA: Diagnosis not present

## 2022-08-14 DIAGNOSIS — G894 Chronic pain syndrome: Secondary | ICD-10-CM | POA: Diagnosis not present

## 2022-08-14 NOTE — Assessment & Plan Note (Signed)
Pt requesting testing for autoimmune disease, will order lupus profile to begin her work up.

## 2022-08-14 NOTE — Chronic Care Management (AMB) (Signed)
Chronic Care Management Pharmacy Assistant   Name: [name] [name]  MRN: [id number] DOB: 1956/09/18  Reason for Encounter: Disease State / Hypertension Assessment Call   Conditions to be addressed/monitored: HTN  Recent office visits:  08/10/2022 [name] [name] - Patient was seen for abnormal urine odor and additional concerns. Started Bactrim DS. Follow up in 4 months.   07/17/2022 [name] [name] - Patient was seen for cough unspecified and additional concerns. Increased Losartan to 50 mg daily. Follow up in 1 month.   05/18/2022 [name] Freshwater MD - Patient was seen for depression/anxiety and additional concerns. Started Losartan 25 mg daily. Discontinued Clopidogrel. Follow up in 3 months.   Recent consult visits:  06/29/2022 [name] Bishop MD (cardiology) - Patient was seen for Dyslipidemia, goal LDL below 70 and additional concerns. Start Aspirin 81 mg daily. Discontinued Probiotic and Rosuvastatin. Follow up in 1 year.   Hospital visits:  None  Medications: Outpatient Encounter Medications as of 08/14/2022  Medication Sig   acyclovir ointment (ZOVIRAX) 5 % Apply 1 application topically every 4 (four) hours as needed. (Patient taking differently: Apply 1 application  topically every 4 (four) hours as needed (mouth sores).)   ALPRAZolam (XANAX) 1 MG tablet Take 0.5-1 mg by mouth at bedtime as needed for sleep.   amphetamine-dextroamphetamine (ADDERALL) 30 MG tablet Take 30 mg by mouth daily.   ARIPiprazole (ABILIFY PO) Take 2 mg by mouth at bedtime.   aspirin EC 81 MG tablet Take 1 tablet (81 mg total) by mouth daily. Swallow whole.   Chlorpheniramine-Phenylephrine 4-10 MG tablet Take 1 tablet by mouth at bedtime.   Cyanocobalamin (VITAMIN B-12) 1000 MCG SUBL Place 1 tablet (1,000 mcg total) under the tongue daily.   cyclobenzaprine (FLEXERIL) 10 MG tablet Take 10 mg by mouth 3 (three) times daily as needed for muscle spasms.   diclofenac Sodium (VOLTAREN) 1 % GEL Apply 2 g  topically 3 (three) times daily as needed (pain).   folic acid (FOLVITE) 1 MG tablet Take 1 tablet (1 mg total) by mouth daily.   gabapentin (NEURONTIN) 300 MG capsule Take 300 mg by mouth as needed.   Heating Pad PADS Use 3 times weekly   losartan (COZAAR) 50 MG tablet Take 1 tablet (50 mg total) by mouth daily.   ondansetron (ZOFRAN-ODT) 4 MG disintegrating tablet DISSOLVE 1 TABLET BY MOUTH EVERY 8 HOURS AS NEEDED FOR NAUSEA   Oxycodone HCl 10 MG TABS Take 10 mg by mouth 4 (four) times daily as needed (pain).   promethazine (PHENERGAN) 25 MG suppository INSERT 1 SUPPOSITORY RECTALLY EVERY 8 HOURS AS NEEDED.   sulfamethoxazole-trimethoprim (BACTRIM DS) 800-160 MG tablet Take 1 tablet by mouth 2 (two) times daily for 5 days.   Vilazodone HCl (VIIBRYD) 40 MG TABS TAKE (1) TABLET BY MOUTH ONCE DAILY WITH FULL MEAL.   No facility-administered encounter medications on file as of 08/14/2022.  Fill History:   Dispensed Days Supply Quantity Provider Pharmacy  ACYCLOVIR  5 % OINT 06/10/2021 30 15 g      Dispensed Days Supply Quantity Provider Pharmacy  alprazolam 1 mg tablet 06/18/2022 30 30 each      Dispensed Days Supply Quantity Provider Pharmacy  dextroamphetamine-amphetamine 30 mg tablet 06/09/2022 30 60 each      Dispensed Days Supply Quantity Provider Pharmacy  aripiprazole 2 mg tablet 06/09/2022 30 30 each      Dispensed Days Supply Quantity Provider Pharmacy  cyclobenzaprine 10 mg tablet 07/17/2022 30 90 each  Dispensed Days Supply Quantity Provider Pharmacy  diclofenac 1 % topical gel 02/16/2022 63 500 g      Dispensed Days Supply Quantity Provider Pharmacy  folic acid 1 mg tablet 05/18/2022 90 90 each      Dispensed Days Supply Quantity Provider Pharmacy  GABAPENTIN  300 MG CAPS 07/20/2021 30 360 capsule      Dispensed Days Supply Quantity Provider Pharmacy  losartan 50 mg tablet 07/17/2022 90 90 each      Dispensed Days Supply Quantity Provider Pharmacy  ONDANSETRON  ODT 4 MG TABLET 05/16/2021 30 90 each      Dispensed Days Supply Quantity Provider Pharmacy  oxycodone 10 mg tablet 07/20/2022 30 120 each      Dispensed Days Supply Quantity Provider Pharmacy  PROMETHEGAN 25 MG SUPPOSITORY 05/16/2021 4 12 each      Dispensed Days Supply Quantity Provider Pharmacy  SULFAMETHOXAZOLE/TRIMETHOPRIM DS  800-160 MG TABS 08/11/2022 5 10 tablet      Dispensed Days Supply Quantity Provider Pharmacy  Viibryd 40 mg tablet 06/30/2022 30 30 each     Reviewed chart prior to disease state call. Spoke with patient regarding BP  Recent Office Vitals: BP Readings from Last 3 Encounters:  08/10/22 118/88  07/17/22 122/80  06/29/22 138/80   Pulse Readings from Last 3 Encounters:  08/10/22 77  07/17/22 82  06/29/22 (!) 54    Wt Readings from Last 3 Encounters:  08/10/22 155 lb 6.4 oz (70.5 kg)  07/17/22 156 lb 8 oz (71 kg)  06/29/22 157 lb 9.6 oz (71.5 kg)     Kidney Function Lab Results  Component Value Date/Time   CREATININE 0.96 11/18/2021 03:21 PM   CREATININE 1.00 07/21/2021 11:21 AM   CREATININE 1.02 (H) 07/06/2021 03:32 PM   CREATININE 1.12 (H) 07/07/2020 02:30 PM   GFR 59.12 (L) 07/21/2021 11:21 AM   GFRNONAA >60 06/29/2021 01:04 AM   GFRAA >60 06/21/2017 10:21 AM       Latest Ref Rng & Units 11/18/2021    3:21 PM 07/21/2021   11:21 AM 07/06/2021    3:32 PM  BMP  Glucose 65 - 99 mg/dL 82  91  84   BUN 7 - 25 mg/dL '11  11  13   '$ Creatinine 0.50 - 1.05 mg/dL 0.96  1.00  1.02   BUN/Creat Ratio 6 - 22 (calc) NOT APPLICABLE   13   Sodium 135 - 146 mmol/L 138  137  137   Potassium 3.5 - 5.3 mmol/L 5.0  3.7  4.7   Chloride 98 - 110 mmol/L 102  100  99   CO2 20 - 32 mmol/L '30  30  26   '$ Calcium 8.6 - 10.4 mg/dL 9.3  9.8  9.7     Current antihypertensive regimen:  Losartan 50 mg daily  How often are you checking your Blood Pressure? Patient has not been checking her blood pressures at home, she states will try and find her blood pressure cuff  and start checking twice per week   Current home BP readings: Patient is not checking blood pressures at home.   What recent interventions/DTPs have been made by any provider to improve Blood Pressure control since last CPP Visit:  Increased Losartan to 50 mg daily.  Any recent hospitalizations or ED visits since last visit with CPP? No recent hospital visits.   What diet changes have been made to improve Blood Pressure Control?  Patient follows no specific way of eating Breakfast - patient will  have little debbie style cakes Lunch - patient will have pepsi and a piece of bread or french fries Dinner - patient will have frozen meals and sometimes she will cook beans or a sweet potato.   What exercise is being done to improve your Blood Pressure Control?  Patient is up and moving daily, activity is intermittent.   Adherence Review: Is the patient currently on ACE/ARB medication? Yes Does the patient have >5 day gap between last estimated fill dates? No  Care Gaps: AWV - scheduled 08/21/2022 Last BP - 118/88 on 08/10/2022 Last A1C - 5.4 on 07/17/2022 AWV - never done Dexa - never done Mammogram - overdue Covid - postponed Hep C Screen - postponed Flu - postponed  Star Rating Drugs: Losartan 50 mg - last filled 07/17/2022 90 DS at [facility] Pharmacist Assistant [phone]

## 2022-08-15 ENCOUNTER — Encounter: Payer: Self-pay | Admitting: Licensed Clinical Social Worker

## 2022-08-15 LAB — SYSTEMIC LUPUS PROFILE A
Chromatin Ab SerPl-aCnc: <0.2 AI (ref 0.0–0.9)
ENA RNP Ab: <0.2 AI (ref 0.0–0.9)
ENA SM Ab Ser-aCnc: <0.2 AI (ref 0.0–0.9)
ENA SSA (RO) Ab: <0.2 AI (ref 0.0–0.9)
ENA SSB (LA) Ab: <0.2 AI (ref 0.0–0.9)
Rheumatoid fact SerPl-aCnc: <10 IU/mL (ref <14.0)
dsDNA Ab: <1 IU/mL (ref 0–9)

## 2022-08-15 NOTE — Progress Notes (Signed)
Labs are negative for lupus at this time.

## 2022-08-21 ENCOUNTER — Ambulatory Visit (INDEPENDENT_AMBULATORY_CARE_PROVIDER_SITE_OTHER): Payer: Medicare Other

## 2022-08-21 VITALS — Ht 64.0 in | Wt 155.0 lb

## 2022-08-21 DIAGNOSIS — Z0000 Encounter for general adult medical examination without abnormal findings: Secondary | ICD-10-CM

## 2022-08-21 NOTE — Patient Instructions (Addendum)
[name] [name] , Thank you for taking time to come for your Medicare Wellness Visit. I appreciate your ongoing commitment to your health goals. Please review the following plan we discussed and let me know if I can assist you in the future.   These are the goals we discussed:  Goals       Care Coordination Activities-wants to decrease stress      Care Coordination Interventions: Advised patient to keep appointments with cardiology, psychiatry and new primary provider Provided education to patient re: care coordination services, Annual Wellness Visit Social Work referral for depression, stress Screening for signs and symptoms of depression related to chronic disease state  Assessed social determinant of health barriers Provided education to patient re: stroke prevention, s/s of heart attack and stroke Advised patient, providing education and rationale, to monitor blood pressure daily and record, calling PCP for findings outside established parameters Declines working with [facility] on hypertension until she sees how her B/P will be after she moves to her new apartment        LCSW Plan of Care-Management of Stress      Care Coordination Interventions: Solution-Focused Strategies employed:  Mindfulness or Relaxation training provided Active listening / Reflection utilized  Engineer, petroleum Provided Verbalization of feelings encouraged  Believes she has a kidney or bladder infection "something is going on" Endorses a really bad odor and burning when she urinates. Concerned about how an infection may negatively impact valve replacement Pt is open to providing urine cultures to local [facility] Pt endorses nightmares have returned after staying at residence alone. Pt could not identify triggers. LCSW discussed how stress can negatively impact our physical and mental health, in addition, to strategies to assist in management LCSW contacted PCP office and relayed information that pt would need to complete  another visit with PCP addressing concerns, prior to ordering labs. LCSW reminded pt that she can use schedule virtual md appts via mychart or visit a local urgent care. Pt agreed to contact PCP office if symptoms worsens LCSW discussed strategies to improve sleep hygiene           Patient stated (pt-stated)      I want to get my Apt in order.        This is a list of the screening recommended for you and due dates:  Health Maintenance  Topic Date Due   DEXA scan (bone density measurement)  Never done   Mammogram  12/24/2021   COVID-19 Vaccine (3 - Moderna risk series) 08/26/2022*   Hepatitis C Screening: USPSTF Recommendation to screen - Ages 18-79 yo.  11/18/2022*   Flu Shot  12/24/2022*   Medicare Annual Wellness Visit  08/22/2023   Colon Cancer Screening  01/31/2024   Pneumonia Vaccine  Completed   Zoster (Shingles) Vaccine  Completed   HPV Vaccine  Aged Out  *Topic was postponed. The date shown is not the original due date.    Advanced directives: Advance directive discussed with you today. Even though you declined this today, please call our office should you change your mind, and we can give you the proper paperwork for you to fill out.   Conditions/risks identified: None  Next appointment: Follow up in one year for your annual wellness visit     Preventive Care 65 Years and Older, Female Preventive care refers to lifestyle choices and visits with your health care provider that can promote health and wellness. What does preventive care include? A yearly physical exam. This is  also called an annual well check. Dental exams once or twice a year. Routine eye exams. Ask your health care provider how often you should have your eyes checked. Personal lifestyle choices, including: Daily care of your teeth and gums. Regular physical activity. Eating a healthy diet. Avoiding tobacco and drug use. Limiting alcohol use. Practicing safe sex. Taking low-dose aspirin every  day. Taking vitamin and mineral supplements as recommended by your health care provider. What happens during an annual well check? The services and screenings done by your health care provider during your annual well check will depend on your age, overall health, lifestyle risk factors, and family history of disease. Counseling  Your health care provider may ask you questions about your: Alcohol use. Tobacco use. Drug use. Emotional well-being. Home and relationship well-being. Sexual activity. Eating habits. History of falls. Memory and ability to understand (cognition). Work and work Statistician. Reproductive health. Screening  You may have the following tests or measurements: Height, weight, and BMI. Blood pressure. Lipid and cholesterol levels. These may be checked every 5 years, or more frequently if you are over 30 years old. Skin check. Lung cancer screening. You may have this screening every year starting at age 36 if you have a 30-pack-year history of smoking and currently smoke or have quit within the past 15 years. Fecal occult blood test (FOBT) of the stool. You may have this test every year starting at age 77. Flexible sigmoidoscopy or colonoscopy. You may have a sigmoidoscopy every 5 years or a colonoscopy every 10 years starting at age 58. Hepatitis C blood test. Hepatitis B blood test. Sexually transmitted disease (STD) testing. Diabetes screening. This is done by checking your blood sugar (glucose) after you have not eaten for a while (fasting). You may have this done every 1-3 years. Bone density scan. This is done to screen for osteoporosis. You may have this done starting at age 41. Mammogram. This may be done every 1-2 years. Talk to your health care provider about how often you should have regular mammograms. Talk with your health care provider about your test results, treatment options, and if necessary, the need for more tests. Vaccines  Your health care  provider may recommend certain vaccines, such as: Influenza vaccine. This is recommended every year. Tetanus, diphtheria, and acellular pertussis (Tdap, Td) vaccine. You may need a Td booster every 10 years. Zoster vaccine. You may need this after age 23. Pneumococcal 13-valent conjugate (PCV13) vaccine. One dose is recommended after age 84. Pneumococcal polysaccharide (PPSV23) vaccine. One dose is recommended after age 69. Talk to your health care provider about which screenings and vaccines you need and how often you need them. This information is not intended to replace advice given to you by your health care provider. Make sure you discuss any questions you have with your health care provider. Document Released: 10/08/2015 Document Revised: 05/31/2016 Document Reviewed: 07/13/2015 Elsevier Interactive Patient Education  2017 [employer] Prevention in the Home Falls can cause injuries. They can happen to people of all ages. There are many things you can do to make your home safe and to help prevent falls. What can I do on the outside of my home? Regularly fix the edges of walkways and driveways and fix any cracks. Remove anything that might make you trip as you walk through a door, such as a raised step or threshold. Trim any bushes or trees on the path to your home. Use bright outdoor lighting. Clear any walking  paths of anything that might make someone trip, such as rocks or tools. Regularly check to see if handrails are loose or broken. Make sure that both sides of any steps have handrails. Any raised decks and porches should have guardrails on the edges. Have any leaves, snow, or ice cleared regularly. Use sand or salt on walking paths during winter. Clean up any spills in your garage right away. This includes oil or grease spills. What can I do in the bathroom? Use night lights. Install grab bars by the toilet and in the tub and shower. Do not use towel bars as grab  bars. Use non-skid mats or decals in the tub or shower. If you need to sit down in the shower, use a plastic, non-slip stool. Keep the floor dry. Clean up any water that spills on the floor as soon as it happens. Remove soap buildup in the tub or shower regularly. Attach bath mats securely with double-sided non-slip rug tape. Do not have throw rugs and other things on the floor that can make you trip. What can I do in the bedroom? Use night lights. Make sure that you have a light by your bed that is easy to reach. Do not use any sheets or blankets that are too big for your bed. They should not hang down onto the floor. Have a firm chair that has side arms. You can use this for support while you get dressed. Do not have throw rugs and other things on the floor that can make you trip. What can I do in the kitchen? Clean up any spills right away. Avoid walking on wet floors. Keep items that you use a lot in easy-to-reach places. If you need to reach something above you, use a strong step stool that has a grab bar. Keep electrical cords out of the way. Do not use floor polish or wax that makes floors slippery. If you must use wax, use non-skid floor wax. Do not have throw rugs and other things on the floor that can make you trip. What can I do with my stairs? Do not leave any items on the stairs. Make sure that there are handrails on both sides of the stairs and use them. Fix handrails that are broken or loose. Make sure that handrails are as long as the stairways. Check any carpeting to make sure that it is firmly attached to the stairs. Fix any carpet that is loose or worn. Avoid having throw rugs at the top or bottom of the stairs. If you do have throw rugs, attach them to the floor with carpet tape. Make sure that you have a light switch at the top of the stairs and the bottom of the stairs. If you do not have them, ask someone to add them for you. What else can I do to help prevent  falls? Wear shoes that: Do not have high heels. Have rubber bottoms. Are comfortable and fit you well. Are closed at the toe. Do not wear sandals. If you use a stepladder: Make sure that it is fully opened. Do not climb a closed stepladder. Make sure that both sides of the stepladder are locked into place. Ask someone to hold it for you, if possible. Clearly mark and make sure that you can see: Any grab bars or handrails. First and last steps. Where the edge of each step is. Use tools that help you move around (mobility aids) if they are needed. These include: Canes. Walkers. Scooters. Crutches.  Turn on the lights when you go into a dark area. Replace any light bulbs as soon as they burn out. Set up your furniture so you have a clear path. Avoid moving your furniture around. If any of your floors are uneven, fix them. If there are any pets around you, be aware of where they are. Review your medicines with your doctor. Some medicines can make you feel dizzy. This can increase your chance of falling. Ask your doctor what other things that you can do to help prevent falls. This information is not intended to replace advice given to you by your health care provider. Make sure you discuss any questions you have with your health care provider. Document Released: 07/08/2009 Document Revised: 02/17/2016 Document Reviewed: 10/16/2014 Elsevier Interactive Patient Education  2017 [employer].
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
## 2022-08-31 ENCOUNTER — Ambulatory Visit (INDEPENDENT_AMBULATORY_CARE_PROVIDER_SITE_OTHER): Payer: Medicare Other | Admitting: Family Medicine

## 2022-08-31 ENCOUNTER — Telehealth: Payer: Self-pay

## 2022-08-31 ENCOUNTER — Encounter: Payer: Self-pay | Admitting: Family Medicine

## 2022-08-31 VITALS — BP 112/70 | HR 85 | Temp 97.4°F | Ht 64.0 in | Wt 156.8 lb

## 2022-08-31 DIAGNOSIS — I10 Essential (primary) hypertension: Secondary | ICD-10-CM

## 2022-08-31 DIAGNOSIS — Z780 Asymptomatic menopausal state: Secondary | ICD-10-CM | POA: Diagnosis not present

## 2022-08-31 DIAGNOSIS — Z23 Encounter for immunization: Secondary | ICD-10-CM

## 2022-08-31 DIAGNOSIS — Z1231 Encounter for screening mammogram for malignant neoplasm of breast: Secondary | ICD-10-CM | POA: Diagnosis not present

## 2022-08-31 NOTE — Progress Notes (Signed)
Established Patient Office Visit  Subjective   Patient ID: [name] [name], female    DOB: 1955-11-10  Age: 66 y.o. MRN: [id number]  Chief Complaint  Patient presents with   Hypertension    Patient complains of elevated blood pressure readings at home x1 month    Patient is here for follow up of her blood pressure. She reports that her wrist cuff has been getting elevated readings at home/ states that she has not had any symptoms of high blood pressure except 1 episode of blurry vision and mild headache. I rechecked her BP today in office and it was 138/84 in the right arm.    Current Outpatient Medications  Medication Instructions   acyclovir ointment (ZOVIRAX) 5 % 1 application , Topical, Every 4 hours PRN   ALPRAZolam (XANAX) 0.5-1 mg, Oral, At bedtime PRN   amphetamine-dextroamphetamine (ADDERALL) 30 MG tablet 30 mg, Oral, Daily   ARIPiprazole (ABILIFY PO) 2 mg, Oral, Nightly   aspirin EC 81 mg, Oral, Daily, Swallow whole.   Chlorpheniramine-Phenylephrine 4-10 MG tablet 1 tablet, Oral, Daily at bedtime   cyclobenzaprine (FLEXERIL) 10 mg, Oral, 3 times daily PRN   diclofenac Sodium (VOLTAREN) 2 g, Topical, 3 times daily PRN   gabapentin (NEURONTIN) 300 mg, Oral, As needed   Heating Pad PADS Use 3 times weekly   losartan (COZAAR) 50 mg, Oral, Daily   ondansetron (ZOFRAN-ODT) 4 MG disintegrating tablet DISSOLVE 1 TABLET BY MOUTH EVERY 8 HOURS AS NEEDED FOR NAUSEA   Oxycodone HCl 10 mg, Oral, 4 times daily PRN   Prenatal Vit-Fe Fumarate-FA (PRENATAL PO) Oral, Daily, Contains folic acid and D63   promethazine (PHENERGAN) 25 MG suppository INSERT 1 SUPPOSITORY RECTALLY EVERY 8 HOURS AS NEEDED.   Vilazodone HCl (VIIBRYD) 40 MG TABS TAKE (1) TABLET BY MOUTH ONCE DAILY WITH FULL MEAL.    Patient Active Problem List   Diagnosis Date Noted   Dry mouth and eyes 07/17/2022   Hypertension 05/18/2022   Chronic cough 01/23/2022   S/P TAVR (transcatheter aortic valve replacement)  06/28/2021   Chronic periodontal disease    Severe aortic stenosis    Hyperlipidemia 03/19/2020   Insomnia 02/21/2012   DEPRESSION/ANXIETY 06/17/2010   Attention deficit disorder 02/02/2009   Osteopenia 10/23/2007   CHRONIC PAIN SYNDROME 09/11/2007   IRRITABLE BOWEL SYNDROME 09/11/2007   Osteoarthritis 09/11/2007      Review of Systems  All other systems reviewed and are negative.     Objective:     BP 112/70 (BP Location: Left Arm, Patient Position: Sitting, Cuff Size: Normal)   Pulse 85   Temp (!) 97.4 F (36.3 C) (Oral)   Ht '5\' 4"'$  (1.626 m)   Wt 156 lb 12.8 oz (71.1 kg)   SpO2 98%   BMI 26.91 kg/m  BP Readings from Last 3 Encounters:  08/31/22 112/70  08/10/22 118/88  07/17/22 122/80      Physical Exam Vitals reviewed.  Constitutional:      Appearance: Normal appearance. She is well-groomed and normal weight.  Eyes:     Conjunctiva/sclera: Conjunctivae normal.  Cardiovascular:     Rate and Rhythm: Normal rate and regular rhythm.     Pulses: Normal pulses.     Heart sounds: S1 normal and S2 normal.  Pulmonary:     Effort: Pulmonary effort is normal.     Breath sounds: Normal breath sounds and air entry.  Abdominal:     General: Bowel sounds are normal.  Musculoskeletal:  Right lower leg: No edema.     Left lower leg: No edema.  Neurological:     Mental Status: She is alert and oriented to person, place, and time. Mental status is at baseline.     Gait: Gait is intact.  Psychiatric:        Mood and Affect: Affect normal. Mood is anxious.        Speech: Speech normal.        Behavior: Behavior normal.        Judgment: Judgment normal.      No results found for any visits on 08/31/22.    The 10-year ASCVD risk score (Arnett DK, et al., 2019) is: 6.2%    Assessment & Plan:   Problem List Items Addressed This Visit       Unprioritized   Hypertension - Primary (Chronic)    Current hypertension medications:       Sig   losartan  (COZAAR) 50 MG tablet (Taking) Take 1 tablet (50 mg total) by mouth daily.     BP today was done twice in the office, both times less than 140. I reassured patient that her BP is in the normal range. I encouraged her to continue the above medications for her blood pressure.       Other Visit Diagnoses     Encounter for screening mammogram for malignant neoplasm of breast       Relevant Orders   MM Digital Screening   Postmenopausal state       Relevant Orders   DG Bone Density   Need for influenza vaccination       Relevant Orders   Flu Vaccine QUAD High Dose(Fluad)       Return in about 3 months (around 11/20/2022) for ok to cancel the dec 20th visit.    Farrel Conners, MD

## 2022-08-31 NOTE — Telephone Encounter (Signed)
--  Caller states doctor increased her dosage about a month and half ago. Today her BP is 154/108. States she is feeling dizzy.  08/30/2022 3:14:44 PM See PCP within 24 Hours Roselyn Reef, RN, [facility]man  Comments User: Cristi Loron, RN Date/Time Eilene Ghazi Time): 08/30/2022 3:07:02 PM BP currently 128/87.  User: Cristi Loron, RN Date/Time Eilene Ghazi Time): 08/30/2022 3:08:45 PM currently her bp is controlled and is not dizzy. She was feeling this way earlier before she called.  Referrals REFERRED TO PCP OFFICE  Pt has appt with PCP on 08/31/22

## 2022-09-01 NOTE — Assessment & Plan Note (Signed)
Current hypertension medications:       Sig   losartan (COZAAR) 50 MG tablet (Taking) Take 1 tablet (50 mg total) by mouth daily.      BP today was done twice in the office, both times less than 140. I reassured patient that her BP is in the normal range. I encouraged her to continue the above medications for her blood pressure.

## 2022-09-12 DIAGNOSIS — G894 Chronic pain syndrome: Secondary | ICD-10-CM | POA: Diagnosis not present

## 2022-09-12 DIAGNOSIS — M5416 Radiculopathy, lumbar region: Secondary | ICD-10-CM | POA: Diagnosis not present

## 2022-09-13 ENCOUNTER — Telehealth: Payer: Medicare Other

## 2022-10-05 ENCOUNTER — Telehealth: Payer: Self-pay | Admitting: Family Medicine

## 2022-10-05 NOTE — Telephone Encounter (Signed)
Wants to get the CT scan discussed at a prior appointment. Got disoriented recently and would like to get it checked out

## 2022-10-05 NOTE — Telephone Encounter (Signed)
I called the patient as any patient that is disoriented should be seen in the ER.  Patient stated she was disoriented for only about 15 minutes this morning at 10:30am and feels she needs to have a CT scan.  I advised the patient she needs to go to the ER for an evaluation as this problem needs to be checked immediately even if symptoms went away.  I offered to inform her of a location close to her home and the patient stated she knows where to go and will drive herself.  I advised the patient she should not operate a car, offered to call 911 if she is alone and she stated her boyfriend is there with her and will take her.

## 2022-10-10 DIAGNOSIS — G894 Chronic pain syndrome: Secondary | ICD-10-CM | POA: Diagnosis not present

## 2022-10-10 DIAGNOSIS — Z79899 Other long term (current) drug therapy: Secondary | ICD-10-CM | POA: Diagnosis not present

## 2022-10-10 DIAGNOSIS — Z79891 Long term (current) use of opiate analgesic: Secondary | ICD-10-CM | POA: Diagnosis not present

## 2022-10-10 DIAGNOSIS — M5416 Radiculopathy, lumbar region: Secondary | ICD-10-CM | POA: Diagnosis not present

## 2022-11-03 ENCOUNTER — Encounter: Payer: Self-pay | Admitting: Family Medicine

## 2022-11-03 ENCOUNTER — Ambulatory Visit (INDEPENDENT_AMBULATORY_CARE_PROVIDER_SITE_OTHER): Payer: 59 | Admitting: Family Medicine

## 2022-11-03 VITALS — BP 144/88 | HR 94 | Temp 98.0°F | Ht 64.0 in | Wt 155.9 lb

## 2022-11-03 DIAGNOSIS — R300 Dysuria: Secondary | ICD-10-CM | POA: Diagnosis not present

## 2022-11-03 DIAGNOSIS — M545 Low back pain, unspecified: Secondary | ICD-10-CM | POA: Diagnosis not present

## 2022-11-03 LAB — POC URINALSYSI DIPSTICK (AUTOMATED)
Bilirubin, UA: NEGATIVE
Blood, UA: NEGATIVE
Glucose, UA: NEGATIVE
Ketones, UA: NEGATIVE
Leukocytes, UA: NEGATIVE
Nitrite, UA: NEGATIVE
Protein, UA: NEGATIVE
Spec Grav, UA: 1.015 (ref 1.010–1.025)
Urobilinogen, UA: 0.2 E.U./dL
pH, UA: 6 (ref 5.0–8.0)

## 2022-11-03 MED ORDER — SULFAMETHOXAZOLE-TRIMETHOPRIM 800-160 MG PO TABS: 1.0000 | ORAL_TABLET | Freq: Two times a day (BID) | ORAL | 0 refills | Status: AC

## 2022-11-03 MED ORDER — ONDANSETRON 4 MG PO TBDP: ORAL_TABLET | ORAL | 0 refills | Status: AC

## 2022-11-03 NOTE — Progress Notes (Signed)
Acute Office Visit  Subjective:     Patient ID: [name] [name], female    DOB: 1956-05-25, 67 y.o.   MRN: [id number]  Chief Complaint  Patient presents with   Back Pain    Patient complains of low back pain x1 week, noticed after picking up 20 pound item, tried pain medication and an antibiotic she had left over as she felt she had a urine infection    Back Pain   Patient is in today for low back pain for 1 week. She reports it started after she picked up something. Thought initially that the back pain was from that, but the pain did not improved with medication, no burning with urination, some subjective fever/chills,  states that she did not see any redness or blood in the urine. States that she had some leftover antibiotics at home so she took that and states that she had improvement after taking, took 2 doses of the antibiotics.   Review of Systems  Musculoskeletal:  Positive for back pain.  All other systems reviewed and are negative.       Objective:    BP (!) 144/88 (BP Location: Left Arm, Patient Position: Sitting, Cuff Size: Normal)   Pulse 94   Temp 98 F (36.7 C) (Oral)   Ht 5' 4"$  (1.626 m)   Wt 155 lb 14.4 oz (70.7 kg)   SpO2 96%   BMI 26.76 kg/m    Physical Exam Constitutional:      General: She is in acute distress (secondary to pain).     Appearance: Normal appearance. She is normal weight.  Cardiovascular:     Rate and Rhythm: Normal rate and regular rhythm.     Heart sounds: Normal heart sounds. No murmur heard. Pulmonary:     Effort: Pulmonary effort is normal.     Breath sounds: Normal breath sounds. No wheezing, rhonchi or rales.  Abdominal:     General: Bowel sounds are normal.     Tenderness: There is no right CVA tenderness or left CVA tenderness.  Neurological:     Mental Status: She is alert.     Results for orders placed or performed in visit on 11/03/22  POCT Urinalysis Dipstick (Automated)  Result Value Ref Range   Color, UA  yellow    Clarity, UA clear    Glucose, UA Negative Negative   Bilirubin, UA negative    Ketones, UA negative    Spec Grav, UA 1.015 1.010 - 1.025   Blood, UA negative    pH, UA 6.0 5.0 - 8.0   Protein, UA Negative Negative   Urobilinogen, UA 0.2 0.2 or 1.0 E.U./dL   Nitrite, UA negative    Leukocytes, UA Negative Negative        Assessment & Plan:   Problem List Items Addressed This Visit   None Visit Diagnoses     Bilateral low back pain, unspecified chronicity, unspecified whether sciatica present    -  Primary   Relevant Orders   POCT Urinalysis Dipstick (Automated) (Completed)   Culture, Urine   Dysuria       Relevant Medications   sulfamethoxazole-trimethoprim (BACTRIM DS) 800-160 MG tablet   ondansetron (ZOFRAN-ODT) 4 MG disintegrating tablet  I cannot rule out a UTI at this time-- UA in office is negative but she took 2 doses of antibiotic before coming in so this might have cleared the infection. I will send the urine for culture. Patient states she can tolerate bactrim so  will call this in for her and PRN ondansetron listed below. Pt will be seeing me on Monday so I will follow up wit hher then     Meds ordered this encounter  Medications   sulfamethoxazole-trimethoprim (BACTRIM DS) 800-160 MG tablet    Sig: Take 1 tablet by mouth 2 (two) times daily for 5 days.    Dispense:  10 tablet    Refill:  0   ondansetron (ZOFRAN-ODT) 4 MG disintegrating tablet    Sig: DISSOLVE 1 TABLET BY MOUTH EVERY 8 HOURS AS NEEDED FOR NAUSEA    Dispense:  30 tablet    Refill:  0    No follow-ups on file.  Farrel Conners, MD

## 2022-11-04 LAB — URINE CULTURE
MICRO NUMBER:: 14545095
SPECIMEN QUALITY:: ADEQUATE

## 2022-11-06 ENCOUNTER — Ambulatory Visit (INDEPENDENT_AMBULATORY_CARE_PROVIDER_SITE_OTHER): Payer: 59 | Admitting: Family Medicine

## 2022-11-06 ENCOUNTER — Encounter: Payer: Self-pay | Admitting: Family Medicine

## 2022-11-06 VITALS — BP 102/72 | HR 85 | Temp 98.2°F | Ht 63.5 in | Wt 157.7 lb

## 2022-11-06 DIAGNOSIS — E559 Vitamin D deficiency, unspecified: Secondary | ICD-10-CM

## 2022-11-06 DIAGNOSIS — E538 Deficiency of other specified B group vitamins: Secondary | ICD-10-CM | POA: Diagnosis not present

## 2022-11-06 DIAGNOSIS — Z1231 Encounter for screening mammogram for malignant neoplasm of breast: Secondary | ICD-10-CM | POA: Diagnosis not present

## 2022-11-06 DIAGNOSIS — Z0000 Encounter for general adult medical examination without abnormal findings: Secondary | ICD-10-CM

## 2022-11-06 DIAGNOSIS — F341 Dysthymic disorder: Secondary | ICD-10-CM

## 2022-11-06 DIAGNOSIS — E785 Hyperlipidemia, unspecified: Secondary | ICD-10-CM | POA: Diagnosis not present

## 2022-11-06 DIAGNOSIS — Z780 Asymptomatic menopausal state: Secondary | ICD-10-CM | POA: Diagnosis not present

## 2022-11-06 LAB — VITAMIN B12: Vitamin B-12: 170 pg/mL — ABNORMAL LOW (ref 211–911)

## 2022-11-06 LAB — LIPID PANEL
Cholesterol: 251 mg/dL — ABNORMAL HIGH (ref 0–200)
HDL: 51.9 mg/dL (ref >39.00)
LDL Cholesterol: 160 mg/dL — ABNORMAL HIGH (ref 0–99)
NonHDL: 198.93
Total CHOL/HDL Ratio: 5
Triglycerides: 195 mg/dL — ABNORMAL HIGH (ref 0.0–149.0)
VLDL: 39 mg/dL (ref 0.0–40.0)

## 2022-11-06 LAB — COMPREHENSIVE METABOLIC PANEL WITH GFR
ALT: 17 U/L (ref 0–35)
AST: 19 U/L (ref 0–37)
Albumin: 4.3 g/dL (ref 3.5–5.2)
Alkaline Phosphatase: 82 U/L (ref 39–117)
BUN: 16 mg/dL (ref 6–23)
CO2: 27 meq/L (ref 19–32)
Calcium: 9.3 mg/dL (ref 8.4–10.5)
Chloride: 102 meq/L (ref 96–112)
Creatinine, Ser: 1.22 mg/dL — ABNORMAL HIGH (ref 0.40–1.20)
GFR: 46.15 mL/min — ABNORMAL LOW
Glucose, Bld: 92 mg/dL (ref 70–99)
Potassium: 4.3 meq/L (ref 3.5–5.1)
Sodium: 136 meq/L (ref 135–145)
Total Bilirubin: 0.6 mg/dL (ref 0.2–1.2)
Total Protein: 7 g/dL (ref 6.0–8.3)

## 2022-11-06 LAB — VITAMIN D 25 HYDROXY (VIT D DEFICIENCY, FRACTURES): VITD: 28.99 ng/mL — ABNORMAL LOW (ref 30.00–100.00)

## 2022-11-06 LAB — TSH: TSH: 3.45 u[IU]/mL (ref 0.35–5.50)

## 2022-11-06 NOTE — Progress Notes (Signed)
Complete physical exam  Patient: [name] [name]   DOB: [birth year]   67 y.o. Female  MRN: [id number]  Subjective:    Chief Complaint  Patient presents with   Annual Exam    [name] [name] is a 67 y.o. female who presents today for a complete physical exam. She reports consuming a general diet. The patient does not participate in regular exercise at present. She generally feels fairly well. She reports sleeping poorly. She does not have additional problems to discuss today.    Most recent fall risk assessment:    08/21/2022    1:50 PM  [place] in the past year? 1  Number falls in past yr: 0  Injury with Fall? 0  Risk for fall due to : No Fall Risks  Follow up Falls prevention discussed     Most recent depression screenings:    08/21/2022    1:42 PM 08/10/2022    4:43 PM  PHQ 2/9 Scores  PHQ - 2 Score 1 4  PHQ- 9 Score 1 12    Vision:seen within the last year and Dental: No current dental problems and Receives regular dental care  Patient Active Problem List   Diagnosis Date Noted   Dry mouth and eyes 07/17/2022   Hypertension 05/18/2022   Chronic cough 01/23/2022   S/P TAVR (transcatheter aortic valve replacement) 06/28/2021   Chronic periodontal disease    Severe aortic stenosis    Hyperlipidemia 03/19/2020   Insomnia 02/21/2012   DEPRESSION/ANXIETY 06/17/2010   Attention deficit disorder 02/02/2009   Osteopenia 10/23/2007   CHRONIC PAIN SYNDROME 09/11/2007   IRRITABLE BOWEL SYNDROME 09/11/2007   Osteoarthritis 09/11/2007      Patient Care Team: Farrel Conners, MD as PCP - General (Family Medicine) Chucky May, MD as Consulting Physician (Psychiatry) Viona Gilmore, [facility] (Inactive) as Pharmacist (Pharmacist)   Outpatient Medications Prior to Visit  Medication Sig   acyclovir ointment (ZOVIRAX) 5 % Apply 1 application topically every 4 (four) hours as needed. (Patient taking differently: Apply 1 application  topically every 4 (four)  hours as needed (mouth sores).)   ALPRAZolam (XANAX) 1 MG tablet Take 0.5-1 mg by mouth at bedtime as needed for sleep.   amphetamine-dextroamphetamine (ADDERALL) 30 MG tablet Take 30 mg by mouth daily.   ARIPiprazole (ABILIFY PO) Take 2 mg by mouth at bedtime.   aspirin EC 81 MG tablet Take 1 tablet (81 mg total) by mouth daily. Swallow whole.   Chlorpheniramine-Phenylephrine 4-10 MG tablet Take 1 tablet by mouth at bedtime.   cyclobenzaprine (FLEXERIL) 10 MG tablet Take 10 mg by mouth 3 (three) times daily as needed for muscle spasms.   diclofenac Sodium (VOLTAREN) 1 % GEL Apply 2 g topically 3 (three) times daily as needed (pain).   gabapentin (NEURONTIN) 300 MG capsule Take 300 mg by mouth as needed.   Heating Pad PADS Use 3 times weekly   losartan (COZAAR) 50 MG tablet Take 1 tablet (50 mg total) by mouth daily.   ondansetron (ZOFRAN-ODT) 4 MG disintegrating tablet DISSOLVE 1 TABLET BY MOUTH EVERY 8 HOURS AS NEEDED FOR NAUSEA   Oxycodone HCl 10 MG TABS Take 10 mg by mouth 4 (four) times daily as needed (pain).   Prenatal Vit-Fe Fumarate-FA (PRENATAL PO) Take by mouth daily. Contains folic acid and [id number]   promethazine (PHENERGAN) 25 MG suppository INSERT 1 SUPPOSITORY RECTALLY EVERY 8 HOURS AS NEEDED.   sulfamethoxazole-trimethoprim (BACTRIM DS) 800-160 MG tablet Take 1  tablet by mouth 2 (two) times daily for 5 days.   Vilazodone HCl (VIIBRYD) 40 MG TABS TAKE (1) TABLET BY MOUTH ONCE DAILY WITH FULL MEAL.   No facility-administered medications prior to visit.    Review of Systems  HENT:  Negative for hearing loss.   Eyes:  Negative for blurred vision.  Respiratory:  Negative for shortness of breath.   Cardiovascular:  Negative for chest pain.  Gastrointestinal: Negative.   Genitourinary: Negative.   Musculoskeletal:  Negative for back pain.  Neurological:  Negative for headaches.  Psychiatric/Behavioral:  Negative for depression.           Objective:     BP 102/72 (BP  Location: Left Arm, Patient Position: Sitting, Cuff Size: Normal)   Pulse 85   Temp 98.2 F (36.8 C) (Oral)   Ht 5' 3.5" (1.613 m)   Wt 157 lb 11.2 oz (71.5 kg)   SpO2 98%   BMI 27.50 kg/m    Physical Exam Vitals reviewed.  Constitutional:      Appearance: Normal appearance. She is well-groomed and normal weight.  HENT:     Right Ear: Tympanic membrane and ear canal normal.     Left Ear: Tympanic membrane and ear canal normal.     Mouth/Throat:     Mouth: Mucous membranes are moist.     Pharynx: No posterior oropharyngeal erythema.  Eyes:     Conjunctiva/sclera: Conjunctivae normal.  Neck:     Thyroid: No thyromegaly.  Cardiovascular:     Rate and Rhythm: Normal rate and regular rhythm.     Pulses: Normal pulses.     Heart sounds: S1 normal and S2 normal.  Pulmonary:     Effort: Pulmonary effort is normal.     Breath sounds: Normal breath sounds and air entry.  Abdominal:     General: Bowel sounds are normal.  Musculoskeletal:     Right lower leg: No edema.     Left lower leg: No edema.  Lymphadenopathy:     Cervical: No cervical adenopathy.  Neurological:     Mental Status: She is alert and oriented to person, place, and time. Mental status is at baseline.     Gait: Gait is intact.  Psychiatric:        Mood and Affect: Mood and affect normal.        Speech: Speech normal.        Behavior: Behavior normal.        Judgment: Judgment normal.      No results found for any visits on 11/06/22.     Assessment & Plan:    Routine Health Maintenance and Physical Exam  Immunization History  Administered Date(s) Administered   Fluad Quad(high Dose 65+) 08/12/2021   H1N1 11/23/2008   Influenza Split 07/24/2011   Influenza Whole 07/15/2007, 10/06/2010   Influenza,inj,Quad PF,6+ Mos 09/04/2013, 06/10/2014, 07/10/2018, 08/20/2019   MODERNA COVID-19 SARS-COV-2 PEDS BIVALENT BOOSTER 6Y-11Y 11/24/2019, 01/24/2020   Moderna SARS-COV2 Booster Vaccination 10/05/2020    PNEUMOCOCCAL CONJUGATE-20 08/12/2021   Td 10/06/2010   Tdap 10/06/2010, 06/09/2021   Zoster Recombinat (Shingrix) 03/10/2018, 09/07/2018   Zoster, Live 02/20/2012    Health Maintenance  Topic Date Due   DEXA SCAN  Never done   MAMMOGRAM  12/24/2021   Hepatitis C Screening  11/18/2022 (Originally 01/13/1974)   COVID-19 Vaccine (3 - Moderna risk series) 11/22/2022 (Originally 11/02/2020)   INFLUENZA VACCINE  12/24/2022 (Originally 04/25/2022)   Medicare Annual Wellness (AWV)  08/22/2023  COLONOSCOPY (Pts 45-49yr Insurance coverage will need to be confirmed)  01/31/2024   DTaP/Tdap/Td (4 - Td or Tdap) 06/10/2031   Pneumonia Vaccine 67 Years old  Completed   Zoster Vaccines- Shingrix  Completed   HPV VACCINES  Aged Out    Discussed health benefits of physical activity, and encouraged her to engage in regular exercise appropriate for her age and condition.  Problem List Items Addressed This Visit       Unprioritized   DEPRESSION/ANXIETY   Relevant Orders   TSH   Hyperlipidemia   Relevant Orders   CMP   Lipid Panel   Other Visit Diagnoses     Routine general medical examination at a health care facility    -  Primary  Normal physical exam findings today. She is due for all of her annual bloodwork today. Pt requesting follow up on B12 deficiency, vitamin D deficiency and TSH in addition to the annual bloodwork. Will order. We discussed increasing exercise/ stretching and her sleep patterns.    Breast cancer screening by mammogram       Relevant Orders   MM Digital Screening   Postmenopausal state       Relevant Orders   DG Bone Density   B12 deficiency       Relevant Orders   Vitamin B12   Vitamin D deficiency       Relevant Orders   Vitamin D, 25-hydroxy      Return in 6 months (on 05/07/2023).     BFarrel Conners MD

## 2022-11-06 NOTE — Patient Instructions (Signed)

## 2022-11-16 DIAGNOSIS — G894 Chronic pain syndrome: Secondary | ICD-10-CM | POA: Diagnosis not present

## 2022-11-16 DIAGNOSIS — M5416 Radiculopathy, lumbar region: Secondary | ICD-10-CM | POA: Diagnosis not present

## 2022-11-17 ENCOUNTER — Telehealth: Payer: Self-pay | Admitting: Family Medicine

## 2022-11-17 NOTE — Telephone Encounter (Signed)
Patient states she finished her sulfamethoxazole-trimethoprim (BACTRIM DS) 800-160 MG tablet treatment but symptoms still persist. Please advise

## 2022-11-17 NOTE — Telephone Encounter (Signed)
Spoke with the patient and informed her of the message below.  Offered an appt on Monday as we do not have any openings today, patient is able to bear the pain now, stated she will wait and see how she feels and call back for an appt if needed.

## 2022-11-17 NOTE — Telephone Encounter (Signed)
It would be highly unusually to fail bactrim for the urine, her urine culture and UA were both negative. I'm not sure her symptoms are related to UTI. I would have her schedule another appointment to re-evaluate.

## 2022-11-23 ENCOUNTER — Encounter: Payer: Self-pay | Admitting: Radiology

## 2022-12-13 DIAGNOSIS — H905 Unspecified sensorineural hearing loss: Secondary | ICD-10-CM | POA: Diagnosis not present

## 2022-12-14 DIAGNOSIS — M5416 Radiculopathy, lumbar region: Secondary | ICD-10-CM | POA: Diagnosis not present

## 2022-12-14 DIAGNOSIS — G894 Chronic pain syndrome: Secondary | ICD-10-CM | POA: Diagnosis not present

## 2023-01-02 ENCOUNTER — Encounter: Payer: Self-pay | Admitting: Family Medicine

## 2023-01-02 ENCOUNTER — Ambulatory Visit (INDEPENDENT_AMBULATORY_CARE_PROVIDER_SITE_OTHER): Payer: 59 | Admitting: Family Medicine

## 2023-01-02 VITALS — BP 128/70 | HR 83 | Temp 98.0°F | Ht 63.5 in | Wt 159.2 lb

## 2023-01-02 DIAGNOSIS — E559 Vitamin D deficiency, unspecified: Secondary | ICD-10-CM | POA: Diagnosis not present

## 2023-01-02 DIAGNOSIS — R413 Other amnesia: Secondary | ICD-10-CM | POA: Diagnosis not present

## 2023-01-02 DIAGNOSIS — Z1159 Encounter for screening for other viral diseases: Secondary | ICD-10-CM | POA: Diagnosis not present

## 2023-01-02 DIAGNOSIS — E538 Deficiency of other specified B group vitamins: Secondary | ICD-10-CM

## 2023-01-02 DIAGNOSIS — Z7689 Persons encountering health services in other specified circumstances: Secondary | ICD-10-CM | POA: Diagnosis not present

## 2023-01-02 MED ORDER — CYANOCOBALAMIN 1000 MCG/ML IJ SOLN: INTRAMUSCULAR | 0 refills | Status: DC
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
## 2023-01-02 NOTE — Patient Instructions (Addendum)
It was a pleasure to meet you today and I look forward to taking care of you. -Recommend to take over the counter vitamin D3 2,000 IU tablet. Take 1 tablet once a day since you had a vitamin D deficiency back in February. -Prescribed Vitamin B12 1,030mcg injections. Adminster 81mL every 7 days x 4 weeks; then will go to monthly injections.  -Follow up in 1 month and will continue with further concerns that you have.

## 2023-01-03 ENCOUNTER — Telehealth: Payer: Self-pay

## 2023-01-03 LAB — HEPATITIS C ANTIBODY: Hepatitis C Ab: NONREACTIVE

## 2023-01-03 NOTE — Telephone Encounter (Signed)
Pt seen results Via my chart  

## 2023-01-03 NOTE — Telephone Encounter (Signed)
-----   Message from Alveria Apley, NP sent at 01/03/2023  3:48 PM EDT ----- Hep C is non reactive.

## 2023-01-04 NOTE — Assessment & Plan Note (Signed)
MMSE completed in office today, scanned into chart. 29/30. Concerned some of her memory loss could be related to her vitamin B12 deficiency. Prescribed vitamin B12 injections weekly x 4, then monthly injections. Based on history it says memory loss, will need to look more into this.

## 2023-01-11 DIAGNOSIS — G894 Chronic pain syndrome: Secondary | ICD-10-CM | POA: Diagnosis not present

## 2023-01-11 DIAGNOSIS — M5416 Radiculopathy, lumbar region: Secondary | ICD-10-CM | POA: Diagnosis not present

## 2023-01-12 ENCOUNTER — Other Ambulatory Visit: Payer: Self-pay | Admitting: *Deleted

## 2023-01-12 DIAGNOSIS — I10 Essential (primary) hypertension: Secondary | ICD-10-CM

## 2023-01-12 MED ORDER — LOSARTAN POTASSIUM 50 MG PO TABS: 50.0000 mg | ORAL_TABLET | Freq: Every day | ORAL | 1 refills | Status: DC

## 2023-01-12 NOTE — Telephone Encounter (Signed)
Rx done. 

## 2023-02-02 ENCOUNTER — Ambulatory Visit: Payer: 59 | Admitting: Family Medicine

## 2023-02-06 ENCOUNTER — Telehealth: Payer: Self-pay

## 2023-02-06 NOTE — Progress Notes (Unsigned)
Care Management & Coordination Services Pharmacy Team  Reason for Encounter: Hypertension  Contacted patient to discuss hypertension disease state. {US HC Outreach:28874}   SCHED FU Current antihypertensive regimen:  Losartan 50 mg daily  Patient verbally confirms she is taking the above medications as directed. {yes/no:20286}  How often are you checking your Blood Pressure? {CHL HP BP Monitoring Frequency:[id number]}  she checks her blood pressure {timing:25218} {before/after:25217} taking her medication.  Current home BP readings: *** DATE:             BP               PULSE   Wrist or arm cuff:  OTC medications including pseudoephedrine or NSAIDs?  Any readings above 180/100? {yes/no:20286} If yes any symptoms of hypertensive emergency? {hypertensive emergency symptoms:25354}  What recent interventions/DTPs have been made by any provider to improve Blood Pressure control since last CPP Visit: No recent interventions  Any recent hospitalizations or ED visits since last visit with CPP? No recent hospital visits.   What diet changes have been made to improve Blood Pressure Control?  Patient follows no specific way of eating Breakfast - patient will have little debbie style cakes Lunch - patient will have pepsi and a piece of bread or french fries Dinner - patient will have frozen meals and sometimes she will cook beans or a sweet potato.  Caffeine intake: Salt intake:  What exercise is being done to improve your Blood Pressure Control?  Patient is up and moving daily, activity is intermittent.   Adherence Review: Is the patient currently on ACE/ARB medication? Yes Does the patient have >5 day gap between last estimated fill dates? No  Care Gaps: AWV - scheduled 08/21/2022 Last BP - 128/70 on 01/02/2023 Covid - overdue Dexascan - never done  Star Rating Drugs: Losartan 50 mg - last filled 02/03/2023 90 DS at [facility]   Chart Updates: Recent office visits:   11/06/2022 Nira Conn MD - Patient was seen for  Routine general medical examination at a health care facility and additional concerns No medication changes.   11/03/2022 Nira Conn MD - Patient was seen for Bilateral low back pain, unspecified chronicity, unspecified whether sciatica present and dysuria. Started Bactrim DS.   08/31/2022 Nira Conn MD - Patient was seen for Primary hypertension and additional concerns. Discontinued Vitamin B12 SL and Folic acid.  08/21/2022 Theresa Mulligan LPN - Encounter for Medicare annual wellness exam   Recent consult visits:  01/02/2023 Zandra Abts NP - Patient was seen for Vitamin D deficiency and additional concerns. Started Vitamin B12 inj. Discontinued Chorpheniramine, Prenatal and Tizanidine.   10/10/2022 Max Cohen (ortho) - Patient was seen for chronic pain syndrome. No additional chart notes.  09/12/2022  Max Cohen (ortho) - Patient was seen for chronic pain syndrome. No additional chart notes.  Hospital visits:  None  Medications: Outpatient Encounter Medications as of 02/06/2023  Medication Sig   acyclovir ointment (ZOVIRAX) 5 % Apply 1 application topically every 4 (four) hours as needed. (Patient taking differently: Apply 1 application  topically every 4 (four) hours as needed (mouth sores).)   ALPRAZolam (XANAX) 1 MG tablet Take 0.5-1 mg by mouth at bedtime as needed for sleep.   amphetamine-dextroamphetamine (ADDERALL) 30 MG tablet Take 30 mg by mouth daily.   ARIPiprazole (ABILIFY PO) Take 2 mg by mouth at bedtime.   aspirin EC 81 MG tablet Take 1 tablet (81 mg total) by mouth daily. Swallow whole.   cyanocobalamin (VITAMIN B12)  1000 MCG/ML injection Inject 1mL in to deltoid every 7 days for 4 weeks, then injection 1mL into deltoid every 30 days for 6 months.   cyclobenzaprine (FLEXERIL) 10 MG tablet Take 10 mg by mouth 3 (three) times daily as needed for muscle spasms.   diclofenac Sodium (VOLTAREN) 1 % GEL Apply 2  g topically 3 (three) times daily as needed (pain).   gabapentin (NEURONTIN) 300 MG capsule Take 300 mg by mouth as needed.   Heating Pad PADS Use 3 times weekly   losartan (COZAAR) 50 MG tablet Take 1 tablet (50 mg total) by mouth daily.   meloxicam (MOBIC) 15 MG tablet Take 15 mg by mouth daily.   naloxone (NARCAN) nasal spray 4 mg/0.1 mL Place into the nose.   ondansetron (ZOFRAN-ODT) 4 MG disintegrating tablet DISSOLVE 1 TABLET BY MOUTH EVERY 8 HOURS AS NEEDED FOR NAUSEA   Oxycodone HCl 10 MG TABS Take 10 mg by mouth 4 (four) times daily as needed (pain).   polyethylene glycol (MIRALAX / GLYCOLAX) 17 g packet Take by mouth.   promethazine (PHENERGAN) 25 MG suppository INSERT 1 SUPPOSITORY RECTALLY EVERY 8 HOURS AS NEEDED.   Vilazodone HCl (VIIBRYD) 40 MG TABS TAKE (1) TABLET BY MOUTH ONCE DAILY WITH FULL MEAL.   No facility-administered encounter medications on file as of 02/06/2023.  Fill History:  Dispensed Days Supply Quantity Provider Pharmacy  alprazolam 1 mg tablet 12/22/2022 30 30 each      Dispensed Days Supply Quantity Provider Pharmacy  dextroamphetamine-amphetamine 30 mg tablet 12/22/2022 30 60 each      Dispensed Days Supply Quantity Provider Pharmacy  ARIPIPRAZOLE 2 MG TABLET 02/01/2023 90 90 each      Dispensed Days Supply Quantity Provider Pharmacy  CYCLOBENZAPRINE HYDROCHLORIDE  10 MG TABS 02/03/2023 90 270 tablet      Dispensed Days Supply Quantity Provider Pharmacy  diclofenac 1 % topical gel 02/16/2022 63 500 g      Dispensed Days Supply Quantity Provider Pharmacy  GABAPENTIN  300 MG CAPS 07/20/2021 30 360 capsule      Dispensed Days Supply Quantity Provider Pharmacy  LOSARTAN POTASSIUM  50 MG TABS 02/03/2023 90 90 tablet      Dispensed Days Supply Quantity Provider Pharmacy  MELOXICAM  15 MG TABS 02/03/2023 90 90 tablet      Dispensed Days Supply Quantity Provider Pharmacy  OXYCODONE HCL (IR) 10 MG TAB 01/11/2023 30 120 each      Dispensed Days Supply  Quantity Provider Pharmacy  VILAZODONE HCL 40 MG TABLET 02/03/2023 30 30 each     Recent Office Vitals: BP Readings from Last 3 Encounters:  01/02/23 128/70  11/06/22 102/72  11/03/22 (!) 144/88   Pulse Readings from Last 3 Encounters:  01/02/23 83  11/06/22 85  11/03/22 94    Wt Readings from Last 3 Encounters:  01/02/23 159 lb 3.2 oz (72.2 kg)  11/06/22 157 lb 11.2 oz (71.5 kg)  11/03/22 155 lb 14.4 oz (70.7 kg)     Kidney Function Lab Results  Component Value Date/Time   CREATININE 1.22 (H) 11/06/2022 09:53 AM   CREATININE 0.96 11/18/2021 03:21 PM   CREATININE 1.00 07/21/2021 11:21 AM   CREATININE 1.12 (H) 07/07/2020 02:30 PM   GFR 46.15 (L) 11/06/2022 09:53 AM   GFRNONAA >60 06/29/2021 01:04 AM   GFRAA >60 06/21/2017 10:21 AM       Latest Ref Rng & Units 11/06/2022    9:53 AM 11/18/2021    3:21 PM 07/21/2021  11:21 AM  BMP  Glucose 70 - 99 mg/dL 92  82  91   BUN 6 - 23 mg/dL 16  11  11    Creatinine 0.40 - 1.20 mg/dL 1.61  0.96  0.45   BUN/Creat Ratio 6 - 22 (calc)  NOT APPLICABLE    Sodium 135 - 145 mEq/L 136  138  137   Potassium 3.5 - 5.1 mEq/L 4.3  5.0  3.7   Chloride 96 - 112 mEq/L 102  102  100   CO2 19 - 32 mEq/L 27  30  30    Calcium 8.4 - 10.5 mg/dL 9.3  9.3  9.8    Inetta Fermo [facility]  Clinical Pharmacist Assistant [phone]

## 2023-02-08 DIAGNOSIS — G894 Chronic pain syndrome: Secondary | ICD-10-CM | POA: Diagnosis not present

## 2023-02-08 DIAGNOSIS — M5416 Radiculopathy, lumbar region: Secondary | ICD-10-CM | POA: Diagnosis not present

## 2023-02-21 ENCOUNTER — Ambulatory Visit (INDEPENDENT_AMBULATORY_CARE_PROVIDER_SITE_OTHER): Payer: 59 | Admitting: Family Medicine

## 2023-02-21 ENCOUNTER — Encounter: Payer: Self-pay | Admitting: Family Medicine

## 2023-02-21 VITALS — BP 124/72 | HR 99 | Temp 99.2°F | Ht 63.0 in | Wt 160.0 lb

## 2023-02-21 DIAGNOSIS — E559 Vitamin D deficiency, unspecified: Secondary | ICD-10-CM | POA: Diagnosis not present

## 2023-02-21 DIAGNOSIS — E785 Hyperlipidemia, unspecified: Secondary | ICD-10-CM | POA: Diagnosis not present

## 2023-02-21 DIAGNOSIS — E538 Deficiency of other specified B group vitamins: Secondary | ICD-10-CM

## 2023-02-21 DIAGNOSIS — M25572 Pain in left ankle and joints of left foot: Secondary | ICD-10-CM

## 2023-02-21 DIAGNOSIS — N289 Disorder of kidney and ureter, unspecified: Secondary | ICD-10-CM | POA: Diagnosis not present

## 2023-02-21 NOTE — Patient Instructions (Addendum)
-  Ordered left ankle and left foot x-ray. Please go to the following location to have x-ray completed. If you do not hear back from this provider in 3 days from having x-ray completed, please call back to the office. [facility] or xray: Walk in 8:30-4:30 during weekdays, no appointment needed [address]  [place], [place] [address]  -Ordered labs today. Office will call with results and you may see them on MyChart. Once labs have resulted, will reach out to cardiology about medication management.  -Follow up in 6 months for chronic management.

## 2023-02-21 NOTE — Progress Notes (Signed)
Established Patient Office Visit   Subjective:  Patient ID: [name] [name], female    DOB: 06/03/1956  Age: 67 y.o. MRN: [id number]  Chief Complaint  Patient presents with   Follow-up    [Pt is here today to talk about her left foot. She thinks the screw is out of it. Pt reports she has issues with both feet Pt reports she would like labs done today .    HPI Left foot: Patient is complaining of left foot ankle pain. She reports it has been painful since she had an open treatment internal fixation trimalleolar fracture with Dr. Mort Sawyers, [place] in 2018. She reports the pain is worse. Constant. Described as aching. Numbness. Hard to ambulate in the morning.   Hyperlipidemia and Valve Replacement: Patient is under the care of Dr. Iantha Fallen with [facility] at [facility]. She was last seen 06/29/2022. Based on note, she is unable to tolerate statin treatment and may be a good candidate for PCSK9 inhibitor. Lipids and LP(a) was collected at that appointment. Patient reports she has not been in contact about medication therapy since.   Vitamin B12 Deficiency: Patient reports she has been taking Vitamin B12 injections weekly since last appointment. Next week will start monthly injections.   Vitamin D Deficiency: Patient reports she is taking vitamin D supplement.   ROS See HPI above    Objective:   BP 124/72   Pulse 99   Temp 99.2 F (37.3 C)   Ht 5\' 3"  (1.6 m)   Wt 160 lb (72.6 kg)   SpO2 100%   BMI 28.34 kg/m    Physical Exam Vitals reviewed.  Constitutional:      General: She is not in acute distress.    Appearance: Normal appearance. She is not ill-appearing, toxic-appearing or diaphoretic.  HENT:     Head: Normocephalic and atraumatic.  Eyes:     General:        Right eye: No discharge.        Left eye: No discharge.     Conjunctiva/sclera: Conjunctivae normal.  Cardiovascular:     Rate and Rhythm: Normal rate.      Pulses:          Dorsalis pedis pulses are 3+ on the left side.  Pulmonary:     Effort: Pulmonary effort is normal. No respiratory distress.  Musculoskeletal:        General: Normal range of motion.     Comments: Surgical scar to left anterior foot. No obvious edema. Callous on medial big toe.   Skin:    General: Skin is warm and dry.  Neurological:     General: No focal deficit present.     Mental Status: She is alert and oriented to person, place, and time. Mental status is at baseline.  Psychiatric:        Mood and Affect: Mood normal.        Behavior: Behavior normal.        Thought Content: Thought content normal.        Judgment: Judgment normal.      Assessment & Plan:  Vitamin D deficiency -     VITAMIN D 25 Hydroxy (Vit-D Deficiency, Fractures)  Vitamin B12 deficiency -     Vitamin B12  Hyperlipidemia, unspecified hyperlipidemia type -     Lipid panel  Kidney function abnormal -     Comprehensive metabolic panel  Pain of joint of left ankle  and foot -     DG Foot Complete Left; Future -     DG Ankle Complete Left; Future  -Ordered left ankle and left foot x-ray. Please go to the following location to have x-ray completed. Advised if she does not hear back from this provider in 3 days from having x-ray completed, please call back to the office. May need to refer back to ortho.   [facility] or xray: Walk in 8:30-4:30 during weekdays, no appointment needed [address]  [place], [place] [address]  -Ordered labs today. Vitamin D for deficiency, 28.99, 3 months ago. Vitamin B12 for deficiency, 170 on 3 months ago. CMP due to abnormal kidney function,  Creatinine 1.22, GFR 46.15, 3 months ago. Once labs have resulted, will reach out to cardiology about medication management.   Return in about 6 months (around 08/24/2023) for chronic management.   Zandra Abts, NP

## 2023-02-22 LAB — COMPREHENSIVE METABOLIC PANEL
ALT: 24 U/L (ref 0–35)
AST: 26 U/L (ref 0–37)
Albumin: 4.5 g/dL (ref 3.5–5.2)
Alkaline Phosphatase: 92 U/L (ref 39–117)
BUN: 10 mg/dL (ref 6–23)
CO2: 27 mEq/L (ref 19–32)
Calcium: 9.3 mg/dL (ref 8.4–10.5)
Chloride: 100 mEq/L (ref 96–112)
Creatinine, Ser: 1.12 mg/dL (ref 0.40–1.20)
GFR: 51.03 mL/min — ABNORMAL LOW (ref >60.00)
Glucose, Bld: 94 mg/dL (ref 70–99)
Potassium: 4.2 mEq/L (ref 3.5–5.1)
Sodium: 136 mEq/L (ref 135–145)
Total Bilirubin: 0.5 mg/dL (ref 0.2–1.2)
Total Protein: 7.4 g/dL (ref 6.0–8.3)

## 2023-02-22 LAB — LIPID PANEL
Cholesterol: 211 mg/dL — ABNORMAL HIGH (ref 0–200)
HDL: 57.5 mg/dL (ref >39.00)
NonHDL: 153.73
Total CHOL/HDL Ratio: 4
Triglycerides: 220 mg/dL — ABNORMAL HIGH (ref 0.0–149.0)
VLDL: 44 mg/dL — ABNORMAL HIGH (ref 0.0–40.0)

## 2023-02-22 LAB — LDL CHOLESTEROL, DIRECT: Direct LDL: 127 mg/dL

## 2023-02-22 LAB — VITAMIN B12: Vitamin B-12: 824 pg/mL (ref 211–911)

## 2023-02-22 LAB — VITAMIN D 25 HYDROXY (VIT D DEFICIENCY, FRACTURES): VITD: 39.11 ng/mL (ref 30.00–100.00)

## 2023-02-23 ENCOUNTER — Telehealth: Payer: Self-pay

## 2023-02-23 NOTE — Telephone Encounter (Signed)
-----   Message from [name] Apley, NP sent at 02/22/2023  4:56 PM EDT ----- Total cholesterol and triglycerides are elevated, diet encouraged - increase intake of fresh fruits and vegetables, increase intake of lean proteins. Bake, broil, or grill foods. Avoid fried, greasy, and fatty foods. Avoid fast foods. Increase intake of fiber-rich whole grains. Exercise encouraged - at least 150 minutes per week and advance as tolerated. I am going to send a message to your cardiologist about medication management and will be back in touch as soon as I get a reply.  Kidney function is still low. Recommend to hydrate. If it gets any lower, will recommend nephrology (kidney specialist) referral.  Vitamin B12 injections are working and levels are stable. Vitamin D has improved. Continue both supplements.  All other labs are stable.

## 2023-02-26 ENCOUNTER — Telehealth: Payer: Self-pay

## 2023-02-26 DIAGNOSIS — Z79899 Other long term (current) drug therapy: Secondary | ICD-10-CM

## 2023-02-26 DIAGNOSIS — E785 Hyperlipidemia, unspecified: Secondary | ICD-10-CM

## 2023-02-26 NOTE — Telephone Encounter (Signed)
-----   Message from Alveria Apley, NP sent at 02/26/2023  8:12 AM EDT ----- Please call patient to let her know that cardiology should be getting in touch with her about scheduling an appointment to have additional labs completed and start another cholesterol medication if she is agreeable that we spoke about at her appointment.  ----- Message ----- From: Chrystie Nose, MD Sent: 02/24/2023   2:42 PM EDT To: Lindell Spar, RN; Alveria Apley, NP  Thanks. She never was started on a PCSK9 inhibitor because she never got the labs that I ordered. I do think she is a good candidate for that class of medicine. She would benefit from having LP(a) checked as well - if she agreeable to trying the medicine, we will reach out to her for a follow-up appt.  Dr. Rennis Golden ----- Message ----- From: Alveria Apley, NP Sent: 02/22/2023   5:02 PM EDT To: Chrystie Nose, MD  Good Afternoon,  We have this mutual patient together. I have reviewed records of when you last seen patient back in October 2023. There was mention about placing patient on a PCSK5 inhibitor since patient is unable to tolerate statin therapy. When inquiring with patient about this, she reports she never received a follow up about possibly starting medication. I just obtained these lipid results and wanted to know if you wanted to start her on medication.   Thank you for your time and response. Have a great afternoon. Mardene Celeste, NP.

## 2023-02-27 NOTE — Telephone Encounter (Signed)
Left vm

## 2023-03-01 ENCOUNTER — Other Ambulatory Visit (HOSPITAL_COMMUNITY): Payer: Self-pay

## 2023-03-01 ENCOUNTER — Telehealth: Payer: Self-pay

## 2023-03-01 NOTE — Telephone Encounter (Signed)
Pharmacy Patient Advocate Encounter   Received notification from RN that prior authorization for REPATHA 140MG /ML is required/requested.   PA submitted to [facility] via CoverMyMeds Key or confirmation # [id number]  Status is pending

## 2023-03-01 NOTE — Telephone Encounter (Signed)
Pharmacy Patient Advocate Encounter  Prior Authorization for REPATHA 140MG  has been APPROVED by [facility] from 6.6.24 to 12.6.24.

## 2023-03-01 NOTE — Telephone Encounter (Signed)
Patient notified via MyChart message. Waiting on reply

## 2023-03-05 MED ORDER — REPATHA SURECLICK 140 MG/ML ~~LOC~~ SOAJ: 140.0000 mg | SUBCUTANEOUS | 3 refills | Status: DC

## 2023-03-09 NOTE — Telephone Encounter (Signed)
Rx sent in by K. Wendie Simmer, RN

## 2023-03-10 DIAGNOSIS — R300 Dysuria: Secondary | ICD-10-CM | POA: Diagnosis not present

## 2023-03-12 ENCOUNTER — Other Ambulatory Visit: Payer: Self-pay | Admitting: Family Medicine

## 2023-03-12 ENCOUNTER — Ambulatory Visit (INDEPENDENT_AMBULATORY_CARE_PROVIDER_SITE_OTHER): Payer: 59 | Admitting: Family Medicine

## 2023-03-12 ENCOUNTER — Ambulatory Visit (HOSPITAL_BASED_OUTPATIENT_CLINIC_OR_DEPARTMENT_OTHER)
Admission: RE | Admit: 2023-03-12 | Discharge: 2023-03-12 | Disposition: A | Discharge status: Home / Self Care | Payer: 59 | Source: Ambulatory Visit | Attending: Family Medicine | Admitting: Family Medicine

## 2023-03-12 DIAGNOSIS — I700 Atherosclerosis of aorta: Secondary | ICD-10-CM | POA: Diagnosis not present

## 2023-03-12 DIAGNOSIS — M5416 Radiculopathy, lumbar region: Secondary | ICD-10-CM | POA: Diagnosis not present

## 2023-03-12 DIAGNOSIS — M6289 Other specified disorders of muscle: Secondary | ICD-10-CM

## 2023-03-12 DIAGNOSIS — G894 Chronic pain syndrome: Secondary | ICD-10-CM | POA: Diagnosis not present

## 2023-03-12 DIAGNOSIS — R1030 Lower abdominal pain, unspecified: Secondary | ICD-10-CM | POA: Diagnosis not present

## 2023-03-12 DIAGNOSIS — R109 Unspecified abdominal pain: Secondary | ICD-10-CM | POA: Diagnosis not present

## 2023-03-12 LAB — POCT URINALYSIS DIPSTICK
Blood, UA: NEGATIVE
Glucose, UA: NEGATIVE
Ketones, UA: NEGATIVE
Leukocytes, UA: NEGATIVE
Nitrite, UA: NEGATIVE
Protein, UA: POSITIVE — AB
Spec Grav, UA: >=1.03 — AB (ref 1.010–1.025)
Urobilinogen, UA: NEGATIVE E.U./dL — AB
pH, UA: 6 (ref 5.0–8.0)

## 2023-03-12 NOTE — Patient Instructions (Addendum)
-  Urinalysis does show a urinary tract infection.  -Ordered a STAT CT abd/pelvis scan. Please report to the following location now for scan. Will be in touch with you once scan is resulted. [address], [place], [place] [address]  [phone] -I hope you get to feeling better soon.

## 2023-03-12 NOTE — Progress Notes (Signed)
Established Patient Office Visit   Subjective:  Patient ID: [name] [name], female    DOB: [birth year]  Age: 67 y.o. MRN: [id number]  Chief Complaint  Patient presents with   Acute Visit    Lower abdominal pain for 4 days. She denies urinary pain. She was seen at urgent care and her urine was normal per pt.   HPI Patient is complaining of lower abd pain all across her abd. She reports she pain started gradually and has increased in the pain. Describes the pain has constant pressure. Not radiating.  Started 5 days ago.  Denies any urinary symptoms or blood in urine. Denies nausea and vomiting. Denies diarrhea and constipation. Last BM was today, normal. Did have diarrhea 2-3 days ago, after the pain started.  Reports her temperature has been around 98.0, which is a fever to her. She reports she went to [facility] on Saturday 06/15.  She reports her urine was normal. She reports prior to going to urgent care she took Azithromycin 250mg  tablet, 3 pills that she takes prior to dental procedures. She was prescribed Sulfamethoxazole-TMP DS 1 tablet BID. She reports she has took this medication and it has only seemed to help a little.  ROS See HPI above     Objective:   BP 130/80   Pulse (!) 102   Temp 98.8 F (37.1 C) (Oral)   Wt 158 lb 6.4 oz (71.8 kg)   SpO2 97%   BMI 28.06 kg/m    Physical Exam Vitals reviewed.  Constitutional:      General: She is not in acute distress.    Appearance: Normal appearance. She is obese. She is not ill-appearing, toxic-appearing or diaphoretic.  HENT:     Head: Normocephalic and atraumatic.  Eyes:     General:        Right eye: No discharge.        Left eye: No discharge.     Conjunctiva/sclera: Conjunctivae normal.  Cardiovascular:     Rate and Rhythm: Normal rate and regular rhythm.     Heart sounds: Normal heart sounds. No murmur heard.    No friction rub. No gallop.  Pulmonary:     Effort: Pulmonary effort is normal. No  respiratory distress.     Breath sounds: Normal breath sounds.  Abdominal:     General: Bowel sounds are normal. There is no distension.     Palpations: Abdomen is soft.     Tenderness: There is abdominal tenderness (Right lower, mid lower, and left lower; more tender on right side). There is guarding (Lower abd pain). There is no right CVA tenderness or left CVA tenderness.  Musculoskeletal:        General: Normal range of motion.  Skin:    General: Skin is warm and dry.  Neurological:     General: No focal deficit present.     Mental Status: She is alert and oriented to person, place, and time. Mental status is at baseline.  Psychiatric:        Mood and Affect: Mood normal.        Behavior: Behavior normal.        Thought Content: Thought content normal.        Judgment: Judgment normal.    Results for orders placed or performed in visit on 03/12/23  POCT Urinalysis Dipstick  Result Value Ref Range   Color, UA     Clarity, UA     Glucose, UA Negative  Negative   Bilirubin, UA 1+    Ketones, UA negative    Spec Grav, UA >=1.030 (A) 1.010 - 1.025   Blood, UA negative    pH, UA 6.0 5.0 - 8.0   Protein, UA Positive (A) Negative   Urobilinogen, UA negative (A) 0.2 or 1.0 E.U./dL   Nitrite, UA negative    Leukocytes, UA Negative Negative   Appearance Yellow    Odor slight odor      Assessment & Plan:  Lower abdominal pain -     POCT urinalysis dipstick -     CT ABDOMEN PELVIS WO CONTRAST; Future  -Urinalysis is only positive for protein and bilirubin. Spec Gravity is slightly elevated. No signs of urinary tract infection. -Unable to complete labs at this location at this time period.  -Ordered a STAT CT abd/pelvis scan for her symptoms. Advised patient to go directly to [facility] for scan. She is going to be worked into their schedule. Will notify patient of results when available.  -Patient expressed understanding and agrees with plan of  care.   Zandra Abts, NP

## 2023-03-19 ENCOUNTER — Telehealth: Payer: Self-pay | Admitting: Family Medicine

## 2023-03-19 NOTE — Telephone Encounter (Signed)
Pt has appt. 03/19/2024

## 2023-03-19 NOTE — Telephone Encounter (Signed)
Patient states that her symptoms have came back in regards to her last appointment. Wondering if she can get a low grade antibiotic until her appointment with the specialist 8.5.24. Please advise

## 2023-03-20 ENCOUNTER — Encounter: Payer: Self-pay | Admitting: Family Medicine

## 2023-03-20 ENCOUNTER — Ambulatory Visit (INDEPENDENT_AMBULATORY_CARE_PROVIDER_SITE_OTHER): Payer: 59 | Admitting: Family Medicine

## 2023-03-20 VITALS — BP 124/80 | HR 97 | Temp 98.0°F | Resp 18 | Ht 63.0 in | Wt 161.1 lb

## 2023-03-20 DIAGNOSIS — R300 Dysuria: Secondary | ICD-10-CM

## 2023-03-20 DIAGNOSIS — R1030 Lower abdominal pain, unspecified: Secondary | ICD-10-CM | POA: Diagnosis not present

## 2023-03-20 LAB — POCT URINALYSIS DIPSTICK
Bilirubin, UA: NEGATIVE
Blood, UA: NEGATIVE
Glucose, UA: NEGATIVE
Ketones, UA: NEGATIVE
Leukocytes, UA: NEGATIVE
Nitrite, UA: NEGATIVE
Protein, UA: NEGATIVE
Spec Grav, UA: 1.025 (ref 1.010–1.025)
Urobilinogen, UA: 0.2 E.U./dL
pH, UA: 6 (ref 5.0–8.0)

## 2023-03-20 LAB — COMPREHENSIVE METABOLIC PANEL
ALT: 45 U/L — ABNORMAL HIGH (ref 0–35)
AST: 32 U/L (ref 0–37)
Albumin: 4.3 g/dL (ref 3.5–5.2)
Alkaline Phosphatase: 91 U/L (ref 39–117)
BUN: 8 mg/dL (ref 6–23)
CO2: 30 mEq/L (ref 19–32)
Calcium: 9.6 mg/dL (ref 8.4–10.5)
Chloride: 98 mEq/L (ref 96–112)
Creatinine, Ser: 0.9 mg/dL (ref 0.40–1.20)
GFR: 66.31 mL/min (ref >60.00)
Glucose, Bld: 84 mg/dL (ref 70–99)
Potassium: 4.1 mEq/L (ref 3.5–5.1)
Sodium: 135 mEq/L (ref 135–145)
Total Bilirubin: 0.5 mg/dL (ref 0.2–1.2)
Total Protein: 7.3 g/dL (ref 6.0–8.3)

## 2023-03-20 LAB — CBC WITH DIFFERENTIAL/PLATELET
Basophils Absolute: 0.1 10*3/uL (ref 0.0–0.1)
Basophils Relative: 1.1 % (ref 0.0–3.0)
Eosinophils Absolute: 0.1 10*3/uL (ref 0.0–0.7)
Eosinophils Relative: 1.9 % (ref 0.0–5.0)
HCT: 46 % (ref 36.0–46.0)
Hemoglobin: 15.2 g/dL — ABNORMAL HIGH (ref 12.0–15.0)
Lymphocytes Relative: 33 % (ref 12.0–46.0)
Lymphs Abs: 2.3 10*3/uL (ref 0.7–4.0)
MCHC: 33 g/dL (ref 30.0–36.0)
MCV: 95.6 fl (ref 78.0–100.0)
Monocytes Absolute: 0.6 10*3/uL (ref 0.1–1.0)
Monocytes Relative: 8.4 % (ref 3.0–12.0)
Neutro Abs: 3.9 10*3/uL (ref 1.4–7.7)
Neutrophils Relative %: 55.6 % (ref 43.0–77.0)
Platelets: 386 10*3/uL (ref 150.0–400.0)
RBC: 4.81 Mil/uL (ref 3.87–5.11)
RDW: 12.7 % (ref 11.5–15.5)
WBC: 7.1 10*3/uL (ref 4.0–10.5)

## 2023-03-20 LAB — LIPASE: Lipase: 37 U/L (ref 11.0–59.0)

## 2023-03-20 NOTE — Progress Notes (Signed)
Acute Office Visit   Subjective:  Patient ID: [name] [name], female    DOB: [birth year], 67 y.o.   MRN: [id number]  Chief Complaint  Patient presents with   Abdominal Pain    Pt finished her antibiotics . Sees urology in [place] on Aug 5,2024 Pt is having lower abdominal pain in the middle per pt  Burning with urination some     HPI: Patient was seen on 03/12/2023 for lower abd pain that had started 5 days prior. Two days prior, 06/15, she reports she went to [facility] . She was prescribed Sulfamethoxazole-TMP DS at her visit at urgent care and was on medication at her 06/17 appointment. She had a CT abd pelvis without contrast scan on 06/17, no acute findings to explain her symptoms at the time. There was suspected pelvic floor laxity with a referral to urogynecology for possible further workup. Through shared decision making, she was to finish the course of antibiotic that was prescribed with [facility]. She has completed antibiotic and reports her symptoms improved. She is now complaining of burning with urination and having mid lower abd pain started again on Sunday. Denies any vaginal itching or discharge. Denies nausea, vomiting, diarrhea, or constipation. Denies fever.    Patient does take Oxycodone and reports it does help with the pain.   Now she reports she feels like something was going to come out of your vaginal area the last time she has these symptoms. History of a total hysterectomy.  Review of Systems  Gastrointestinal:  Positive for abdominal pain.   See HPI above     Objective:   BP 124/80   Pulse 97   Temp 98 F (36.7 C) (Temporal)   Resp 18   Ht 5\' 3"  (1.6 m)   Wt 161 lb 2 oz (73.1 kg)   SpO2 99%   BMI 28.54 kg/m    Physical Exam Vitals reviewed. Exam conducted with a chaperone present.  Constitutional:      General: She is not in acute distress.    Appearance: Normal appearance. She is obese. She is not ill-appearing, toxic-appearing or  diaphoretic.  Eyes:     General:        Right eye: No discharge.        Left eye: No discharge.     Conjunctiva/sclera: Conjunctivae normal.  Cardiovascular:     Rate and Rhythm: Normal rate and regular rhythm.     Heart sounds: Normal heart sounds. No murmur heard.    No friction rub. No gallop.  Pulmonary:     Effort: Pulmonary effort is normal. No respiratory distress.     Breath sounds: Normal breath sounds.  Abdominal:     General: Bowel sounds are normal.     Tenderness: There is abdominal tenderness (Mid lower, right lower, right upper quad). There is no right CVA tenderness or left CVA tenderness.  Genitourinary:    Vagina: No vaginal discharge or bleeding.     Comments: External genitourinary exam was normal, no signs of abnormalities. Placed speculum into vagina, but could not tolerate it.  Musculoskeletal:        General: Normal range of motion.  Skin:    General: Skin is warm and dry.  Neurological:     General: No focal deficit present.     Mental Status: She is alert and oriented to person, place, and time. Mental status is at baseline.  Psychiatric:  Mood and Affect: Mood normal.        Behavior: Behavior normal.        Thought Content: Thought content normal.        Judgment: Judgment normal.     Results for orders placed or performed in visit on 03/20/23  POCT urinalysis dipstick  Result Value Ref Range   Color, UA lt yellow    Clarity, UA     Glucose, UA Negative Negative   Bilirubin, UA neg    Ketones, UA neg    Spec Grav, UA 1.025 1.010 - 1.025   Blood, UA neg    pH, UA 6.0 5.0 - 8.0   Protein, UA Negative Negative   Urobilinogen, UA 0.2 0.2 or 1.0 E.U./dL   Nitrite, UA neg    Leukocytes, UA Negative Negative   Appearance     Odor        Assessment & Plan:  Lower abdominal pain -     CBC with Differential/Platelet -     Comprehensive metabolic panel -     Lipase  Dysuria -     POCT urinalysis dipstick -     Urine  Culture  -Urinalysis was completed with no signs of infection or abnormalities. Will send urine for culture and treat accordingly if positive for a urinary tract infection. -Attempted a pelvic exam and to assess opening of urethra since she reports she felt something coming out of her vagina on the previous round of pain. External genitourinary exam was normal, no signs of abnormalities.Placed speculum into vagina, but patient could not tolerate it.  -Ordered CBC, CMP, and lipase due to symptoms. Office will call with lab results and she may see them on MyChart. -Will hold on doing additional imaging until labs have resulted since she just had a CT last week with no reason seen for symptoms.  -Recommend to take your usual Oxycodone for pain since this seems to help.  -She was initially scheduled with urology, but has been canceled and another appointment has been made, but with urogynecology for the suspected pelvic floor laxity. Informed patient to information.  -If symptoms were to become severe, please go to the closes emergency department.   Zandra Abts, NP

## 2023-03-20 NOTE — Patient Instructions (Addendum)
-  Urinalysis was completed with no signs of infection or abnormalities. Will send urine for culture and treat accordingly if positive for a urinary tract infection. -External genitourinary exam was normal, no signs of abnormalities. -Ordered CBC, CMP, and lipase due to symptoms. Office will call with lab results and you may see them on MyChart. -Will hold on doing additional imaging until labs have resulted since you just had a CT last week.  -Recommend to take your usual Oxycodone for pain since this seems to help.  -Your urology appointment has been canceled and another appointment has been made, but with urogynecology for the suspected pelvic floor laxity.  -If symptoms were to become severe, please go to the closes emergency department.

## 2023-03-21 ENCOUNTER — Telehealth: Payer: Self-pay

## 2023-03-21 NOTE — Telephone Encounter (Signed)
-----   Message from Alveria Apley, NP sent at 03/21/2023  8:32 AM EDT ----- Labs do not show a reason for your pain. No signs of infection. Will wait for urine culture and make sure to go the urogynecologist appointment at the end of July. Continue to take Oxycodone to help with pain.

## 2023-03-21 NOTE — Telephone Encounter (Signed)
Pt is aware of lab results. Pt states she has been eating a lot of salads and cutting back on sweets and she thinks this may be causing some abdominal pain.

## 2023-03-22 ENCOUNTER — Telehealth: Payer: Self-pay

## 2023-03-22 LAB — URINE CULTURE
MICRO NUMBER:: 15125847
Result:: NO GROWTH
SPECIMEN QUALITY:: ADEQUATE

## 2023-03-22 NOTE — Telephone Encounter (Signed)
-----   Message from Alveria Apley, NP sent at 03/22/2023  8:00 AM EDT ----- Your urine culture came back with no growth.

## 2023-04-09 ENCOUNTER — Ambulatory Visit (INDEPENDENT_AMBULATORY_CARE_PROVIDER_SITE_OTHER)
Admission: RE | Admit: 2023-04-09 | Discharge: 2023-04-09 | Disposition: A | Discharge status: Home / Self Care | Payer: 59 | Source: Ambulatory Visit | Attending: Family Medicine | Admitting: Family Medicine

## 2023-04-09 DIAGNOSIS — G894 Chronic pain syndrome: Secondary | ICD-10-CM | POA: Diagnosis not present

## 2023-04-09 DIAGNOSIS — M25572 Pain in left ankle and joints of left foot: Secondary | ICD-10-CM

## 2023-04-09 DIAGNOSIS — M5416 Radiculopathy, lumbar region: Secondary | ICD-10-CM | POA: Diagnosis not present

## 2023-04-09 DIAGNOSIS — Z79891 Long term (current) use of opiate analgesic: Secondary | ICD-10-CM | POA: Diagnosis not present

## 2023-04-09 DIAGNOSIS — M19072 Primary osteoarthritis, left ankle and foot: Secondary | ICD-10-CM | POA: Diagnosis not present

## 2023-04-09 DIAGNOSIS — Z79899 Other long term (current) drug therapy: Secondary | ICD-10-CM | POA: Diagnosis not present

## 2023-04-09 DIAGNOSIS — M79672 Pain in left foot: Secondary | ICD-10-CM | POA: Diagnosis not present

## 2023-04-14 ENCOUNTER — Encounter: Payer: Self-pay | Admitting: Family Medicine

## 2023-04-16 ENCOUNTER — Telehealth: Payer: Self-pay

## 2023-04-16 NOTE — Telephone Encounter (Signed)
-----   Message from Zandra Abts sent at 04/16/2023  9:03 AM EDT ----- Reviewed your left ankle and foot x-ray. It looks like your hardware is intact from previous surgery. Wear and tear on the lateral ankle joint. Osteoarthritis in your toes and joints between in foot and ankle. If you are still having symptoms, recommend a referral to orthopedic.

## 2023-04-25 ENCOUNTER — Ambulatory Visit (INDEPENDENT_AMBULATORY_CARE_PROVIDER_SITE_OTHER): Payer: 59 | Admitting: Obstetrics and Gynecology

## 2023-04-25 ENCOUNTER — Encounter: Payer: Self-pay | Admitting: Obstetrics and Gynecology

## 2023-04-25 VITALS — BP 111/74 | HR 86 | Ht 62.6 in | Wt 158.6 lb

## 2023-04-25 DIAGNOSIS — R350 Frequency of micturition: Secondary | ICD-10-CM

## 2023-04-25 DIAGNOSIS — M6289 Other specified disorders of muscle: Secondary | ICD-10-CM | POA: Diagnosis not present

## 2023-04-25 DIAGNOSIS — M62838 Other muscle spasm: Secondary | ICD-10-CM

## 2023-04-25 DIAGNOSIS — N952 Postmenopausal atrophic vaginitis: Secondary | ICD-10-CM

## 2023-04-25 LAB — POCT URINALYSIS DIPSTICK
Bilirubin, UA: NEGATIVE
Blood, UA: NEGATIVE
Glucose, UA: NEGATIVE
Ketones, UA: NEGATIVE
Leukocytes, UA: NEGATIVE
Nitrite, UA: NEGATIVE
Protein, UA: NEGATIVE
Spec Grav, UA: >=1.03 — AB (ref 1.010–1.025)
Urobilinogen, UA: 0.2 E.U./dL
pH, UA: 5.5 (ref 5.0–8.0)

## 2023-04-25 MED ORDER — ESTRADIOL 0.1 MG/GM VA CREA
0.5000 g | TOPICAL_CREAM | VAGINAL | 11 refills | Status: DC
Start: 2023-04-26 — End: 2024-05-16

## 2023-04-25 MED ORDER — DIAZEPAM 5 MG PO TABS
ORAL_TABLET | ORAL | 1 refills | Status: DC
Start: 2023-04-25 — End: 2023-10-15

## 2023-04-25 NOTE — Patient Instructions (Addendum)
Today we talked about ways to manage bladder urgency such as altering your diet to avoid irritative beverages and foods (bladder diet) as well as attempting to decrease stress and other exacerbating factors.  There is a website with helpful information for people with bladder irritation, called the IC Network at https://www.ic-network.com. This website has more information about a healthy bladder diet and patient forums for support.  The Most Bothersome Foods* The Least Bothersome Foods*  Coffee - Regular & Decaf Tea - caffeinated Carbonated beverages - cola, non-colas, diet & caffeine-free Alcohols - Beer, Red Wine, White Wine, [address] - Grapefruit, [place], Orange, [employer] - Cranberry, Grapefruit, Orange, Pineapple Vegetables - Tomato & Tomato Products Flavor Enhancers - Hot peppers, Spicy foods, Chili, Horseradish, Vinegar, Monosodium glutamate (MSG) Artificial Sweeteners - NutraSweet, Sweet 'N Low, Equal (sweetener), Saccharin Ethnic foods - [place], [place], [place] food [employer] - low-fat & whole Fruits - Bananas, Blueberries, Honeydew melon, Pears, Raisins, Watermelon Vegetables - Broccoli, [address] Sprouts, [place], Carrots, Cauliflower, [place], Cucumber, Mushrooms, Peas, Radishes, Squash, Zucchini, White potatoes, Sweet potatoes & yams Poultry - Chicken, Eggs, [place], [employer] - Beef, Diplomatic Services operational officer, Lamb Seafood - Shrimp, [place] fish, Salmon Grains - Oat, Rice Snacks - Pretzels, Popcorn  *Lenward Chancellor et al. Diet and its role in interstitial cystitis/bladder pain syndrome (IC/BPS) and comorbid conditions. [employer]. BJU Int. 2012 Jan 11.     Supplements you can consider include:   Cranberry Extract pills  D-mannose  Aloe Pills  Probiotic/Prebiotic   You have significant pelvic floor muscle tension. This is a possible contributor to some of the feelings of irritation in the vagina and around your hips and back. I would like you to start pelvic floor PT.    For the muscle spasms in the pelvic floor, I would like you to try placing valium vaginally for this irritation. Use it only as needed and not within 4 hours of your oxycodone and make sure to place it vaginally.   The estrogen cream will help with the dryness and is a first line of defense against UTIs.
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
## 2023-04-26 ENCOUNTER — Ambulatory Visit: Payer: 59 | Admitting: *Deleted

## 2023-04-26 DIAGNOSIS — Z0000 Encounter for general adult medical examination without abnormal findings: Secondary | ICD-10-CM

## 2023-04-26 NOTE — Patient Instructions (Signed)
[name] [name] , Thank you for taking time to come for your Medicare Wellness Visit. I appreciate your ongoing commitment to your health goals. Please review the following plan we discussed and let me know if I can assist you in the future.   Screening recommendations/referrals: Colonoscopy: up to date Mammogram: scheduled Bone Density: scheduled Recommended yearly ophthalmology/optometry visit for glaucoma screening and checkup Recommended yearly dental visit for hygiene and checkup  Vaccinations: Influenza vaccine: up to date Pneumococcal vaccine: up to date Tdap vaccine: up to date  Shingles vaccine:1 of 2    Advanced directives: Education provided    Preventive Care 65 Years and Older, Female Preventive care refers to lifestyle choices and visits with your health care provider that can promote health and wellness. What does preventive care include? A yearly physical exam. This is also called an annual well check. Dental exams once or twice a year. Routine eye exams. Ask your health care provider how often you should have your eyes checked. Personal lifestyle choices, including: Daily care of your teeth and gums. Regular physical activity. Eating a healthy diet. Avoiding tobacco and drug use. Limiting alcohol use. Practicing safe sex. Taking low-dose aspirin every day. Taking vitamin and mineral supplements as recommended by your health care provider. What happens during an annual well check? The services and screenings done by your health care provider during your annual well check will depend on your age, overall health, lifestyle risk factors, and family history of disease. Counseling  Your health care provider may ask you questions about your: Alcohol use. Tobacco use. Drug use. Emotional well-being. Home and relationship well-being. Sexual activity. Eating habits. History of falls. Memory and ability to understand (cognition). Work and work  Astronomer. Reproductive health. Screening  You may have the following tests or measurements: Height, weight, and BMI. Blood pressure. Lipid and cholesterol levels. These may be checked every 5 years, or more frequently if you are over 45 years old. Skin check. Lung cancer screening. You may have this screening every year starting at age 71 if you have a 30-pack-year history of smoking and currently smoke or have quit within the past 15 years. Fecal occult blood test (FOBT) of the stool. You may have this test every year starting at age 20. Flexible sigmoidoscopy or colonoscopy. You may have a sigmoidoscopy every 5 years or a colonoscopy every 10 years starting at age 85. Hepatitis C blood test. Hepatitis B blood test. Sexually transmitted disease (STD) testing. Diabetes screening. This is done by checking your blood sugar (glucose) after you have not eaten for a while (fasting). You may have this done every 1-3 years. Bone density scan. This is done to screen for osteoporosis. You may have this done starting at age 65. Mammogram. This may be done every 1-2 years. Talk to your health care provider about how often you should have regular mammograms. Talk with your health care provider about your test results, treatment options, and if necessary, the need for more tests. Vaccines  Your health care provider may recommend certain vaccines, such as: Influenza vaccine. This is recommended every year. Tetanus, diphtheria, and acellular pertussis (Tdap, Td) vaccine. You may need a Td booster every 10 years. Zoster vaccine. You may need this after age 9. Pneumococcal 13-valent conjugate (PCV13) vaccine. One dose is recommended after age 3. Pneumococcal polysaccharide (PPSV23) vaccine. One dose is recommended after age 42. Talk to your health care provider about which screenings and vaccines you need and how often you need them.  This information is not intended to replace advice given to you by  your health care provider. Make sure you discuss any questions you have with your health care provider. Document Released: 10/08/2015 Document Revised: 05/31/2016 Document Reviewed: 07/13/2015 Elsevier Interactive Patient Education  2017 [employer].  Fall Prevention in the Home Falls can cause injuries. They can happen to people of all ages. There are many things you can do to make your home safe and to help prevent falls. What can I do on the outside of my home? Regularly fix the edges of walkways and driveways and fix any cracks. Remove anything that might make you trip as you walk through a door, such as a raised step or threshold. Trim any bushes or trees on the path to your home. Use bright outdoor lighting. Clear any walking paths of anything that might make someone trip, such as rocks or tools. Regularly check to see if handrails are loose or broken. Make sure that both sides of any steps have handrails. Any raised decks and porches should have guardrails on the edges. Have any leaves, snow, or ice cleared regularly. Use sand or salt on walking paths during winter. Clean up any spills in your garage right away. This includes oil or grease spills. What can I do in the bathroom? Use night lights. Install grab bars by the toilet and in the tub and shower. Do not use towel bars as grab bars. Use non-skid mats or decals in the tub or shower. If you need to sit down in the shower, use a plastic, non-slip stool. Keep the floor dry. Clean up any water that spills on the floor as soon as it happens. Remove soap buildup in the tub or shower regularly. Attach bath mats securely with double-sided non-slip rug tape. Do not have throw rugs and other things on the floor that can make you trip. What can I do in the bedroom? Use night lights. Make sure that you have a light by your bed that is easy to reach. Do not use any sheets or blankets that are too big for your bed. They should not hang  down onto the floor. Have a firm chair that has side arms. You can use this for support while you get dressed. Do not have throw rugs and other things on the floor that can make you trip. What can I do in the kitchen? Clean up any spills right away. Avoid walking on wet floors. Keep items that you use a lot in easy-to-reach places. If you need to reach something above you, use a strong step stool that has a grab bar. Keep electrical cords out of the way. Do not use floor polish or wax that makes floors slippery. If you must use wax, use non-skid floor wax. Do not have throw rugs and other things on the floor that can make you trip. What can I do with my stairs? Do not leave any items on the stairs. Make sure that there are handrails on both sides of the stairs and use them. Fix handrails that are broken or loose. Make sure that handrails are as long as the stairways. Check any carpeting to make sure that it is firmly attached to the stairs. Fix any carpet that is loose or worn. Avoid having throw rugs at the top or bottom of the stairs. If you do have throw rugs, attach them to the floor with carpet tape. Make sure that you have a light switch at the  top of the stairs and the bottom of the stairs. If you do not have them, ask someone to add them for you. What else can I do to help prevent falls? Wear shoes that: Do not have high heels. Have rubber bottoms. Are comfortable and fit you well. Are closed at the toe. Do not wear sandals. If you use a stepladder: Make sure that it is fully opened. Do not climb a closed stepladder. Make sure that both sides of the stepladder are locked into place. Ask someone to hold it for you, if possible. Clearly mark and make sure that you can see: Any grab bars or handrails. First and last steps. Where the edge of each step is. Use tools that help you move around (mobility aids) if they are needed. These  include: Canes. Walkers. Scooters. Crutches. Turn on the lights when you go into a dark area. Replace any light bulbs as soon as they burn out. Set up your furniture so you have a clear path. Avoid moving your furniture around. If any of your floors are uneven, fix them. If there are any pets around you, be aware of where they are. Review your medicines with your doctor. Some medicines can make you feel dizzy. This can increase your chance of falling. Ask your doctor what other things that you can do to help prevent falls. This information is not intended to replace advice given to you by your health care provider. Make sure you discuss any questions you have with your health care provider. Document Released: 07/08/2009 Document Revised: 02/17/2016 Document Reviewed: 10/16/2014 Elsevier Interactive Patient Education  2017 [employer].
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
## 2023-04-30 ENCOUNTER — Ambulatory Visit: Payer: Self-pay | Admitting: Urology

## 2023-05-07 ENCOUNTER — Inpatient Hospital Stay: Admission: RE | Admit: 2023-05-07 | Payer: 59 | Source: Ambulatory Visit

## 2023-05-07 ENCOUNTER — Ambulatory Visit
Admission: RE | Admit: 2023-05-07 | Discharge: 2023-05-07 | Disposition: A | Discharge status: Home / Self Care | Payer: 59 | Source: Ambulatory Visit | Attending: Family Medicine | Admitting: Family Medicine

## 2023-05-07 DIAGNOSIS — Z1231 Encounter for screening mammogram for malignant neoplasm of breast: Secondary | ICD-10-CM

## 2023-05-07 DIAGNOSIS — Z780 Asymptomatic menopausal state: Secondary | ICD-10-CM

## 2023-05-07 DIAGNOSIS — M8588 Other specified disorders of bone density and structure, other site: Secondary | ICD-10-CM | POA: Diagnosis not present

## 2023-05-08 ENCOUNTER — Telehealth: Payer: Self-pay

## 2023-05-08 DIAGNOSIS — M5416 Radiculopathy, lumbar region: Secondary | ICD-10-CM | POA: Diagnosis not present

## 2023-05-08 DIAGNOSIS — G894 Chronic pain syndrome: Secondary | ICD-10-CM | POA: Diagnosis not present

## 2023-05-08 NOTE — Telephone Encounter (Signed)
-----   Message from Zandra Abts sent at 05/07/2023  4:54 PM EDT ----- Your bone density scan shows osteoporosis. Recommend to make an appointment to discuss further treatment.

## 2023-05-09 ENCOUNTER — Telehealth: Payer: Self-pay

## 2023-05-09 NOTE — Telephone Encounter (Signed)
-----   Message from Zandra Abts sent at 05/09/2023  9:24 AM EDT ----- Your mammogram come back good. No suspicious findings for malignancy. Recommend a one year screening mammogram.

## 2023-05-09 NOTE — Progress Notes (Signed)
I'm sorry I think that was accidentally sent to me.

## 2023-05-14 ENCOUNTER — Telehealth: Payer: Self-pay | Admitting: Family Medicine

## 2023-05-14 NOTE — Telephone Encounter (Signed)
Please advise. Send to PA team or would you like to send in another RX?

## 2023-05-14 NOTE — Telephone Encounter (Signed)
Patient states she needs a refill on her cyanocobalamin (VITAMIN B12) 1000 MCG/ML injection but her insurance will not cover unless a prior authorization is sent in, please advise

## 2023-05-15 ENCOUNTER — Telehealth: Payer: Self-pay

## 2023-05-15 ENCOUNTER — Other Ambulatory Visit (HOSPITAL_COMMUNITY): Payer: Self-pay

## 2023-05-15 NOTE — Telephone Encounter (Signed)
Noted TY

## 2023-05-15 NOTE — Telephone Encounter (Signed)
PA request has been Submitted. New Encounter created for follow up. For additional info see Pharmacy Prior Auth telephone encounter from 05/15/23.

## 2023-05-15 NOTE — Telephone Encounter (Signed)
Pharmacy Patient Advocate Encounter   Received notification from Pt Calls Messages that prior authorization for Cyanocobalamin 1000MCG/ML solution is required/requested.   Insurance verification completed.   The patient is insured through [employer] .   Per test claim: PA required; PA submitted to [facility] via CoverMyMeds Key/confirmation #/EOC [id number] Status is pending

## 2023-05-15 NOTE — Telephone Encounter (Signed)
Sent!

## 2023-05-15 NOTE — Telephone Encounter (Signed)
Called pt she is aware of this being sent for PA

## 2023-05-15 NOTE — Telephone Encounter (Signed)
yes

## 2023-05-16 NOTE — Telephone Encounter (Signed)
Pt reports she has a refill at her pharmacy and this will only be $12.00 . She is going to pay for this one. She reports she can tell and big difference in her body in 3 weeks, she will also pick up the OTC 1,000 mcg.

## 2023-05-16 NOTE — Telephone Encounter (Signed)
Pharmacy Patient Advocate Encounter  Received notification from [facility] that Prior Authorization for Cyanocobalam Inj  has been DENIED. Please advise how you'd like to proceed. Full denial letter will be uploaded to the media tab. See denial reason below.   PA #/Case ID/Reference #: [id number]   Denial Reason: Dietary supplements, including prescription vitamins and mineral products (except prenatal vitamins and fluoride vitamins for children), health and beauty aids, herbal supplements and/or alternative medicines are excluded from coverage under Medicare rules.

## 2023-05-16 NOTE — Telephone Encounter (Signed)
Noted. Sent to Melany [place]. Please advise

## 2023-05-16 NOTE — Telephone Encounter (Signed)
Left vm to call office

## 2023-05-18 ENCOUNTER — Telehealth (INDEPENDENT_AMBULATORY_CARE_PROVIDER_SITE_OTHER): Payer: 59 | Admitting: Family Medicine

## 2023-05-18 ENCOUNTER — Encounter: Payer: Self-pay | Admitting: Family Medicine

## 2023-05-18 VITALS — Temp 97.8°F | Ht 62.5 in | Wt 159.0 lb

## 2023-05-18 DIAGNOSIS — M810 Age-related osteoporosis without current pathological fracture: Secondary | ICD-10-CM | POA: Diagnosis not present

## 2023-05-18 MED ORDER — ALENDRONATE SODIUM 70 MG PO TABS
70.0000 mg | ORAL_TABLET | ORAL | 11 refills | Status: AC
Start: 2023-05-18 — End: ?
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
## 2023-05-18 NOTE — Addendum Note (Signed)
Addended by: Alveria Apley on: 05/18/2023 03:58 PM   Modules accepted: Level of Service

## 2023-06-07 DIAGNOSIS — M5416 Radiculopathy, lumbar region: Secondary | ICD-10-CM | POA: Diagnosis not present

## 2023-06-07 DIAGNOSIS — G894 Chronic pain syndrome: Secondary | ICD-10-CM | POA: Diagnosis not present

## 2023-06-28 ENCOUNTER — Ambulatory Visit: Payer: 59 | Admitting: Internal Medicine

## 2023-07-03 ENCOUNTER — Other Ambulatory Visit: Payer: Self-pay | Admitting: Family Medicine

## 2023-07-03 DIAGNOSIS — E538 Deficiency of other specified B group vitamins: Secondary | ICD-10-CM
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
## 2023-07-04 ENCOUNTER — Other Ambulatory Visit: Payer: Self-pay

## 2023-07-04 ENCOUNTER — Encounter: Payer: Self-pay | Admitting: Physical Therapy

## 2023-07-04 ENCOUNTER — Ambulatory Visit: Payer: 59 | Attending: Obstetrics and Gynecology | Admitting: Physical Therapy

## 2023-07-04 DIAGNOSIS — R102 Pelvic and perineal pain: Secondary | ICD-10-CM | POA: Insufficient documentation

## 2023-07-04 DIAGNOSIS — R252 Cramp and spasm: Secondary | ICD-10-CM | POA: Diagnosis not present

## 2023-07-05 DIAGNOSIS — G894 Chronic pain syndrome: Secondary | ICD-10-CM | POA: Diagnosis not present

## 2023-07-05 DIAGNOSIS — M5416 Radiculopathy, lumbar region: Secondary | ICD-10-CM | POA: Diagnosis not present

## 2023-07-06 ENCOUNTER — Ambulatory Visit (HOSPITAL_COMMUNITY): Payer: 59 | Attending: Internal Medicine

## 2023-07-12 ENCOUNTER — Ambulatory Visit (INDEPENDENT_AMBULATORY_CARE_PROVIDER_SITE_OTHER): Payer: 59 | Admitting: Family Medicine

## 2023-07-12 VITALS — BP 110/68 | HR 110 | Temp 98.0°F | Wt 158.2 lb

## 2023-07-12 DIAGNOSIS — R8290 Unspecified abnormal findings in urine: Secondary | ICD-10-CM | POA: Diagnosis not present

## 2023-07-12 DIAGNOSIS — N390 Urinary tract infection, site not specified: Secondary | ICD-10-CM

## 2023-07-12 DIAGNOSIS — Z23 Encounter for immunization: Secondary | ICD-10-CM

## 2023-07-12 DIAGNOSIS — R350 Frequency of micturition: Secondary | ICD-10-CM | POA: Diagnosis not present

## 2023-07-12 DIAGNOSIS — N3946 Mixed incontinence: Secondary | ICD-10-CM | POA: Diagnosis not present

## 2023-07-12 LAB — POCT URINALYSIS DIPSTICK OB
Blood, UA: NEGATIVE
Glucose, UA: NEGATIVE
Leukocytes, UA: NEGATIVE
Nitrite, UA: NEGATIVE
Spec Grav, UA: >=1.03 — AB (ref 1.010–1.025)
Urobilinogen, UA: NEGATIVE U/dL — AB
pH, UA: 5.5 (ref 5.0–8.0)

## 2023-07-12 NOTE — Patient Instructions (Addendum)
-  Placed a referral to urology. Please call back to the office or send a MyChart if you not receive a call or MyChart message about an appointment in 2 weeks. -Please follow up with [facility] Urogynecology at [facility] in [place], [place] [address], [place], [place] [address]  28 mi [phone] -Urinalysis completed with no signs of infection. Will send urine for culture.

## 2023-07-12 NOTE — Progress Notes (Signed)
Established Patient Office Visit   Subjective:  Patient ID: [name] [name], female    DOB: 1956-03-15  Age: 67 y.o. MRN: [id number]  Chief Complaint  Patient presents with   infection    Did see telehealth and was given med. States she can not control her bladder. States if cecks urine will not show anything due to taking the abx from telehealth.  Had a dizzy spell yesterday  that lasted 5 mins in [employer]    HPI Patient reports she had a telehealth visit, about 2.5 weeks ago. She does not remember who she seen for the visit. She thinks it was through [employer]. She was prescribed an antibiotic that she does not recall the name of the medication for 5 days. She reports she has been experiencing an increase in frequency since then and having to wear heavy pads. Also, reports she had a dizzy spell yesterday while in [employer]. But, denies dizziness during visit or today.   Patient was seen By [facility]gynecology at [facility] by Job Founds, NP on 04/25/2023 for pelvic floor dysfunction, levator spasms, urinary frequency, and vaginal atrophy. Patient continues to complain of frequent UTIs and pelvic pain. Based on visit, she has significant pelvic floor muscle pain and tension and was started with one session of pelvic floor PT as recommended. She was prescribed Valium PRN to help with muscle tension and vaginal estrogen cream for vaginal atrophy, however, patient reports she has not been using these. She feels her genital area is too dry to insert valium into vagina and use the estrogen cream. She was suppose to follow up in 4 months with no appointment scheduled.  ROS See HPI above     Objective:   BP 110/68 (BP Location: Left Arm, Patient Position: Sitting, Cuff Size: Normal)   Pulse (!) 110   Temp 98 F (36.7 C) (Oral)   Wt 158 lb 3.2 oz (71.8 kg)   SpO2 95%   BMI 28.47 kg/m    Physical Exam Vitals reviewed.  Constitutional:      General: She is not in acute  distress.    Appearance: Normal appearance. She is not ill-appearing, toxic-appearing or diaphoretic.  HENT:     Head: Normocephalic and atraumatic.  Eyes:     General:        Right eye: No discharge.        Left eye: No discharge.     Conjunctiva/sclera: Conjunctivae normal.  Cardiovascular:     Rate and Rhythm: Normal rate and regular rhythm.     Heart sounds: Normal heart sounds. No murmur heard.    No friction rub. No gallop.  Pulmonary:     Effort: Pulmonary effort is normal. No respiratory distress.     Breath sounds: Normal breath sounds.  Abdominal:     General: Bowel sounds are normal. There is no distension.     Palpations: Abdomen is soft.  Musculoskeletal:        General: Normal range of motion.  Skin:    General: Skin is warm and dry.  Neurological:     General: No focal deficit present.     Mental Status: She is alert and oriented to person, place, and time. Mental status is at baseline.  Psychiatric:        Mood and Affect: Mood normal.        Behavior: Behavior normal.        Thought Content: Thought content normal.  Judgment: Judgment normal.    Results for orders placed or performed in visit on 07/12/23  POC Urinalysis Dipstick OB  Result Value Ref Range   Color, UA dark yellow    Clarity, UA clear    Glucose, UA Negative Negative   Bilirubin, UA 1+    Ketones, UA +    Spec Grav, UA >=1.030 (A) 1.010 - 1.025   Blood, UA neg    pH, UA 5.5 5.0 - 8.0   POC,PROTEIN,UA Small (1+) Negative, Trace, Small (1+), Moderate (2+), Large (3+), 4+   Urobilinogen, UA negative (A) 0.2 or 1.0 E.U./dL   Nitrite, UA neg    Leukocytes, UA Negative Negative   Appearance     Odor       Assessment & Plan:  Urinary incontinence, mixed -     Ambulatory referral to Urology  Increased frequency of urination -     POC Urinalysis Dipstick OB  Abnormal urinalysis -     Urine Culture  Frequent UTI -     Ambulatory referral to Urology  -Urinalysis completed  with no signs of infection. Will send urine for culture. Will treat accordingly. -Placed a referral to urology for continued symptoms of urinary incontinent and reported frequent UTIs.  -Also, recommend patient follow back up with [facility] Urogynecology at [facility] as recommended.  -Will follow up on dizziness if this occurs again.  -Follow up as scheduled in December. Will provide influenza vaccine at that appointment.   Zandra Abts, NP

## 2023-07-13 DIAGNOSIS — R8290 Unspecified abnormal findings in urine: Secondary | ICD-10-CM | POA: Diagnosis not present

## 2023-07-14 LAB — URINE CULTURE
MICRO NUMBER:: 15613855
SPECIMEN QUALITY:: ADEQUATE

## 2023-07-16 DIAGNOSIS — M6289 Other specified disorders of muscle: Secondary | ICD-10-CM | POA: Insufficient documentation

## 2023-07-16 DIAGNOSIS — Z91410 Personal history of adult physical and sexual abuse: Secondary | ICD-10-CM | POA: Insufficient documentation

## 2023-07-16 DIAGNOSIS — N941 Unspecified dyspareunia: Secondary | ICD-10-CM | POA: Insufficient documentation

## 2023-07-16 DIAGNOSIS — N398 Other specified disorders of urinary system: Secondary | ICD-10-CM | POA: Insufficient documentation

## 2023-07-16 NOTE — Progress Notes (Signed)
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 care needs b. Practice self care b. May benefit from mental health treatment with primary care provider or psychiatrist/ psychologist / therapist 2. For dysfunction of inner bladder lining (glycosaminoglycan (GAG) layer): a. Prescription medications: - Oral medication: Pentosan polysulfate (Elmiron) > May be cost prohibitive; not covered well by some insurance providers. Can be ordered via a compounding pharmacy as an alternative. > Requires 6 months of daily use (3 times per day) to achieve full efficacy and to assess symptomatic response. > Requires routine annual eye exam. b. Medications instilled directly into the  bladder through a catheter: - Pentosan polysulfate (Elmiron) - Heparin 3. For intrinsic  abnormality of the urine: Identification and avoidance of dietary bladder irritants (commonly includes caffeine, alcohol, citric acid, spicy foods, acidic foods). For histamine-related bladder inflammation/ irritation: Antihistamine medication(s): Minimizes the uptake of histamine molecules Over-the-counter medications: Zyrtec (Cetirizine) and Xyzal (Levocetirizine) are preferred Prescription medications: Hydroxyzine (Vistaril / Atarax) Mast cell stabilizer medication:  Montelukast (Singulair): Inhibits leukotriene receptors present in the bladder, thus preventing the activation of mast cells (which release pro-inflammatory mediators) For neuropathic pain/ nerve up-regulation, which results in inappropriately elevated pain signaling from the nerves in/around bladder to the brain: Prescription medications:  Such as: Amitriptyline (Elavil), Nortriptyline (Pamelor), Trazodone, Cymbalta (Duloxetine), Gabapentin (Neurontin), Lyrica (Pregabalin)  IC treatment options for flare ups (for on-demand use): Urinary analgesics for burning / painful urination Over-the-counter medications: Phenazopyradine (Pyridium). Use should be limited to no more than 3 days consecutively due to risk for methemoglobinemia and damage to liver & bone health. Prescription medications: Uribel, Urogesic blue, Uro-MP, Urelle, etc. Can be take every 6 hours as needed May be cost prohibitive; not covered well by some insurance providers Bladder instillations: Placement of a numbing medication (such as Lidocaine + sodium bicarbonate or Bupivacaine) through a catheter into the bladder. Can be done in-office at a nurse visit or at home Note: These medications are currently in short supply nationwide and may be difficult to obtain until the shortage improves. Can add on an over-the-counter antihistamine or TUMS (if not already taking)  Management options for high-tone pelvic floor dysfunction secondary to IC: Stretching,  relaxation techniques (such as deep breathing/ meditation), yoga, avoidance of Kegels or other activities which strain or tighten pelvic floor muscles Pelvic floor physical therapy Oral muscle relaxer medications such as Baclofen (Lioresal), Methocarbamol (Robaxin), Tizanadine (Zanaflex), Cyclobenzaprine (Flexeril) Vaginal or rectal Diazepam (Valium) suppositories Trigger point injections Pelvic nerve blocks  Additional IC treatment options: Some patients find benefit from use of baking soda to reduce the acidity of the urine. Can dissolve half a teaspoon in a full glass of water and drink twice a day. Increased water intake can dilute urine concentration - may help by reducing urine acidity.  Supplements such as Bladder Renew, CystoProtek, Cysta-Q, Prelief, Freeze-dried aloe vera, Marshmallow root, etc. These have not been evaluated by the FDA. Bladder Renew, CystoProtek, Cysta-Q: Sometimes used as an alternative to Elmiron. Prelief is an over the counter medication available at most pharmacies. Prelief works to remove acid from highly acidic foods such as tomato sauce, orange juice, coffee, and wine. Prelief can be added directly to these foods and helps to reduce bladder pain and urinary urgency. Cystoscopy with hydrodistention  a. This is an outpatient surgical procedure done in the OR under anesthesia for diagnostic benefit and   potential therapeutic benefit. It helps clinicians rule out other conditions, assess/ treat pain triggers such as Hunner's ulcers in the bladder, and evaluate bladder capacity (size). There is approximately 60% chance of symptomatic improvement with hydrodistention; the response can be quite variable. Rare surgical interventions Suprapubic catheter placement Bladder removal (cystectomy) with urinary diversion (such as ileal conduit, [place] pouch, or neobladder creation)       Common triggers for IC flare ups: Certain beverages and foods which irritate the  bladder This can be very patient specific See IC diet for list of common dietary irritants Physical stress Emotional / psychological stress Activities that put pressure on the pelvic area I perineum (such as sexual intercourse, long car rides, etc) Seasonal  environmental allergens Urinary tract infection (UTI). You can call the urology office [phone]) to request lab order for urine testing if desired. May require antibiotic therapy if deemed appropriate based on urine culture result. Post-infectious (post-UTI) bladder inflammation. Can take several days or weeks to resolve.  IC treatment options for flare ups (for on-demand use):  Urinary analgesics for burning / painful urination Over-the-counter medications:  Phenazopyradine (Pyridium). Commonly known under the AZO brand name. Use should be limited to no more than 3 days consecutively due to risk for methemoglobinemia and damage to liver & bone health. Prescription medications: Uribel, Urogesic blue, Uro-MP, Urelle, etc. Can be take every 6 hours as needed May be cost prohibitive; not covered well by some insurance providers Antihistamine use to minimize histamine-mediated bladder inflammation / pain If taking Hydroxyzine (a prescription antihistamine), may temporarily increase dose at bedtime as directed by provider. Can add on an over-the-counter antihistamine (Zyrtec or Xyzal preferred). Bladder instillations: Placement of a numbing medication (such as Lidocaine + sodium bicarbonate or Bupivacaine) through a catheter into the bladder.  b. Can be done in-office at a nurse visit or at home.   c. Note: These medications are currently in short supply nationwide and may be difficult to obtain until the shortage improves.  Other: If symptoms fail to improve in a timely fashion, could also consider cystoscopy with hydrodistention for potential diagnostic and therapeutic benefit.        INTERSTITIAL CYSTITIS DIET  Many  people with Interstitial Cystitis find that simple changes in their diet can help to control IC symptoms and avoid IC flare-ups. Typically, avoiding foods high in acid and potassium, as well as beverages containing caffeine and alcohol, is a good idea. This helpful guide can help you make "IC-Smart" meal choices. Keep it handy for easy reference when dining out or when preparing meals at home.  FRUITS: Allowable - Blueberries, Melons (except cantaloupe), Min[place]id Acid-Reduced Orange Juice (this can be diluted using water or Prelief* to prevent a flare-up) and Pears.  Avoidable -All other Fruits and Juices.  VEGETABLES:  Allowable - Homegrown Tomatoes, Ac[place], Alfalfa, Beet, Broccoli, Bok Choy, [address], [place], [place], [place], [place], [place], [place], [place], [place], Mustard [place] and [employer].  Avoidable - Store-bought Tomatoes, Onions, Tofu, Soybeans, Lima beans, Fava Beans, Asparagus, Beet Greens, [place], [place], [place], [place], [place], [place], Mushrooms, [place], [place], [place], [place], [employer] and Rhubarb.  MEATS/FISH: Allowable - Beef, Chicken, [place], [place], [address], [address], [place], [place], [place], [place], [place], [place], [place], [place], [place], [place] and Shrimp.  Avoidable - Chicken Liver, Corned Beef, Pickled Herring and Fresh Herring, Aged, [address], Cured, Processed or Smoked Meats/Fish, [place], [place], [address] [place], [place], [place], [place], [place], [place], [place] and meats that contain nitrates or nitrites.  CARBS/GRAINS:  Allowable - Pasta, Rice, Potatoes, White Bread, Bread made of Rice, Millet, Quinoa, Buckwheat, [place], Refined, Cooked or Ready to Eat Cereal.  Avoidable - Rye Bread, Sourdough Breads and Fresh Baked Yeast Products  FATS:    DAIRY: Allowable - Butter, Margarine, Vegetable Oils, Almonds, Cashews and Pine Nuts. Avoidable - Any other Nuts, Mayonnaise, Salad Dressing. Allowable - Milk, American Cheese, [address] Cream Cheese, String Cheese, [place], Ricotta Cheese, Frozen Yogurt and White Chocolate.   Avoidable - Yogurt, Goat's Milk, Chocolate Milk, Sour Cream, Aged Cheeses, Chocolate, [place], Camembert, Cheddar, [address], [place], [place], [place], [place], [place], [place], [place], [place] and [place].  SOUPS: Allowable - Soups made with allowed Vegetables, Cream of Broccoli, Water Cress Soup, Cream of Cauliflower Soup.  Avoidable -Any packaged Soups.   SEASONINGS:  Allowable - Garlic and other seasonings not listed below.  Avoid - Miso, Soy Sauce, Vinegar and any Spicy Foods (especially [place], [place], [place] and [place] foods)  BEVERAGES:  Allowable - Bottled or Spring Water, Decaffeinated, Acid-free or Low Acid Coffee or Tea, Alfalfa Leaf Tea, Sun Tea, Kava Instant Coffee, Peppermint Tea, Golden Seal Tea, Flat Soda and Low Acid Wines.  Avoid -Alcoholic Beverages (except Low Acid Wines), Carbonated Drinks such as Soda, Coffee and Tea (except what is listed above), Fruit Juices and Chamomile Tea.  PRESERVATIVE: Avoid - Benzol Alcohol, Citric Acid, Monosodium Glutamate (MSG), Aspartame, Saccharin and foods containing Artificial Ingredients/Colors.        Elmiron disclosure:  Elmiron is approved by the FDA and AUA as a therapeutic option for Interstitial Cystitis / Painful Bladder Syndrome (IC/BPS). Mechanism of action: Elmiron is a synthetic polysaccharide that binds to the urothelium, providing a protective layer that may help prevent painful irritation from components of the urine. Common side effects may include: Headache, GI upset, hair loss. It may take up to six months of daily Elmiron use to assess full therapeutic effect on the bladder.  Issue:  Over the past few years there have been new concerns regarding possible development of vision problems (maculopathy/ retinopathy) with Elmiron use. Possible symptoms may include difficulty with reading, blurred vision, and  prolonged adjustment to darkness. Based on research study findings so far: There is evidence of correlation but not causation.  It is unclear if this specific maculopathy / retinopathy is truly a drug-associated toxicity due to Elmiron use in patients with IC versus a potential clinical manifestation of IC itself. It is unclear if length and/or quantity of Elmiron exposure correlates with the development and/or severity degree of toxicity. Impact:  Although no definitive evidence exists that Elmiron causes vision problems, patients who are currently taking Elmiron or have previously taken Elmiron are strongly advised to have annual routine eye exams and to discuss Elmiron use with their eye doctor.  Elmiron continues to be approved by the FDA and AUA as a therapeutic option for IC. Currently there are no alternative oral prescription medications which are FDA approved with the same mechanism of action.  Alternatives to oral Elmiron use include: Elmiron instillations via catheter directly into the bladder (at home or at nurse visit in office) Heparin instillations via catheter directly into the bladder (at home or at nurse visit in office) Use of over-the-counter (non-prescription) supplements such as Cystoprotek and Cysta-Q. These have not been evaluated by the FDA, therefore the efficacy, safety, and mechanism of action are unknown.   Additional information:  Per the American Urological Association 2022 guideline for Diagnosis and Treatment of Interstitial Cystitis/Bladder Pain Syndrome: Amitriptyline, cimetidine, hydroxyzine, or pentosan polysulfate (Elmiron) may be administered as oral medications (listed in alphabetical order; no hierarchy is implied). Clinicians should counsel patients who are considering pentosan polysulfate about the potential risk for macular damage and vision-related  injuries. CheapStates.se cystitis/bladder-pain-syndrome-(2022)   Statement in The Journal of Urology, Volume 208, Issue 6, December 2022: "Regarding pentosan polysulfate (PPS), it remains as the only U.S. Food and Drug Administration (FDA) approved oral treatment for IC/BPS. The original guideline noted uncertainty about the balance between risks and benefits, and therefore listed PPS as an option. We agree the balance has now shifted due to the associated maculopathy and black box warning from the FDA, which are outlined in the new statement #15. Clearly, this new evidence should be included  in patient counseling discussions. However, certain patients may still opt for a trial of PPS, and if symptoms improve significantly then they may choose to continue PPS with close ophthalmologic monitoring. At this point we do not believe the risk/benefit balance would justify removing PPS altogether from the list of options, especially given its ongoing endorsement by the FDA." Per FDA: "Pigment changes in the retina of the eye (also referred to as pigmentary maculopathy in medical journal articles) have been reported with long-term use of ELMIRON. While the cause of the pigmentary changes is unclear, continued long term dosing with ELMIRON may be a risk factor. The consequences of these pigmentary changes in the retina are not fully understood. Visual symptoms that have been reported include: difficulty reading, slow adjustment to low or reduced light environments, and blurred vision. If you already have retinal pigment changes from other causes, it may be difficult to distinguish future retinal pigment changes if they occur. Call your doctor (including your eye doctor) if you notice any changes in your vision. Throughout your treatment, regular eye examinations that include retinal examinations are suggested for early detection of  retinal/macular changes. Your doctor will discuss with you when to get your first eye examination and follow up exams, and whether the treatment should be continued since these changes may be irreversible and may progress even after stopping treatment." https://www.[email or web address] [employer].dk _docs/label/2020/[email or web address].pdf  Total time spent caring for the patient today was over 60 minutes. This includes time spent on the date of the visit reviewing the patient's chart before the visit, time spent during the visit, and time spent after the visit on documentation. Over 50% of that time was spent in face-to-face time with this patient for direct counseling. E&M based on time and complexity of medical decision making.  Electronically signed by:  Donnita Falls, MSN, FNP-C, CUNP 07/24/2023 1:23 PM

## 2023-07-17 ENCOUNTER — Telehealth: Payer: Self-pay

## 2023-07-17 NOTE — Patient Outreach (Signed)
Successful call to patient on today regarding preventative mammogram screening. Patient shared mammogram completed in August 2024.  Baruch Gouty [facility] Assistant [facility] Population [facility] [phone]

## 2023-07-23 ENCOUNTER — Other Ambulatory Visit: Payer: Self-pay | Admitting: Family Medicine

## 2023-07-23 DIAGNOSIS — I10 Essential (primary) hypertension: Secondary | ICD-10-CM

## 2023-07-24 ENCOUNTER — Ambulatory Visit: Payer: 59 | Admitting: Urology

## 2023-07-24 VITALS — BP 147/82 | HR 87 | Temp 97.0°F | Ht 62.5 in | Wt 158.0 lb

## 2023-07-24 DIAGNOSIS — N398 Other specified disorders of urinary system: Secondary | ICD-10-CM | POA: Diagnosis not present

## 2023-07-24 DIAGNOSIS — M6289 Other specified disorders of muscle: Secondary | ICD-10-CM

## 2023-07-24 DIAGNOSIS — K589 Irritable bowel syndrome without diarrhea: Secondary | ICD-10-CM | POA: Insufficient documentation

## 2023-07-24 DIAGNOSIS — N3946 Mixed incontinence: Secondary | ICD-10-CM

## 2023-07-24 DIAGNOSIS — Z91410 Personal history of adult physical and sexual abuse: Secondary | ICD-10-CM

## 2023-07-24 DIAGNOSIS — N3010 Interstitial cystitis (chronic) without hematuria: Secondary | ICD-10-CM

## 2023-07-24 DIAGNOSIS — N3289 Other specified disorders of bladder: Secondary | ICD-10-CM

## 2023-07-24 DIAGNOSIS — N200 Calculus of kidney: Secondary | ICD-10-CM

## 2023-07-24 DIAGNOSIS — N941 Unspecified dyspareunia: Secondary | ICD-10-CM

## 2023-07-24 DIAGNOSIS — R3989 Other symptoms and signs involving the genitourinary system: Secondary | ICD-10-CM | POA: Insufficient documentation

## 2023-07-24 DIAGNOSIS — G8929 Other chronic pain: Secondary | ICD-10-CM | POA: Insufficient documentation

## 2023-07-24 DIAGNOSIS — Z87440 Personal history of urinary (tract) infections: Secondary | ICD-10-CM

## 2023-07-24 DIAGNOSIS — R682 Dry mouth, unspecified: Secondary | ICD-10-CM

## 2023-07-24 DIAGNOSIS — N952 Postmenopausal atrophic vaginitis: Secondary | ICD-10-CM

## 2023-07-24 LAB — URINALYSIS, ROUTINE W REFLEX MICROSCOPIC
Bilirubin, UA: NEGATIVE
Glucose, UA: NEGATIVE
Ketones, UA: NEGATIVE
Leukocytes,UA: NEGATIVE
Nitrite, UA: NEGATIVE
Protein,UA: NEGATIVE
RBC, UA: NEGATIVE
Specific Gravity, UA: 1.025 (ref 1.005–1.030)
Urobilinogen, Ur: 0.2 mg/dL (ref 0.2–1.0)
pH, UA: 6 (ref 5.0–7.5)

## 2023-07-24 LAB — BLADDER SCAN AMB NON-IMAGING: Scan Result: 0

## 2023-07-24 MED ORDER — DICYCLOMINE HCL 10 MG PO CAPS
10.0000 mg | ORAL_CAPSULE | Freq: Three times a day (TID) | ORAL | 2 refills | Status: DC | PRN
Start: 2023-07-24 — End: 2023-10-12

## 2023-07-24 MED ORDER — PENTOSAN POLYSULFATE SODIUM 100 MG PO CAPS
100.0000 mg | ORAL_CAPSULE | Freq: Three times a day (TID) | ORAL | 11 refills | Status: DC
Start: 2023-07-24 — End: 2024-06-30

## 2023-07-24 MED ORDER — HYDROXYZINE HCL 25 MG PO TABS
25.0000 mg | ORAL_TABLET | Freq: Every evening | ORAL | 11 refills | Status: AC
Start: 2023-07-24 — End: ?

## 2023-07-24 NOTE — Patient Instructions (Signed)
American Urological Association (AUA) -  Diagnosis and Treatment of Interstitial Cystitis/Bladder Pain Syndrome (2022): [email or web address] pain-syndrome-(2022)  The exact cause of Interstitial Cystitis (IC) (aka Bladder Pain Syndrome) is unknown, however theories include: Dysfunction of inner bladder lining known as the glycosaminoglycan (GAG) layer. Intrinsic abnormality of the urine. Histamine-related bladder inflammation/ irritation. Neuropathic pain/ nerve up-regulation, which results in inappropriately elevated pain signaling from the nerves in/around bladder to the brain. IC is not related to any infectious process or autoimmune conditions.  Commonly related problems for patients with IC: High-tone pelvic floor dysfunction (pelvic floor muscle tightness). Can cause pelvic pain, difficulty urinating, difficulty having bowel movements, pain with intercourse. Neuropathic pain / Nerve up-regulation. Results in inappropriately elevated pain signaling from the nerves in/around bladder to the brain. Several commonly co-occurring conditions include IBS, fibromyalgia, CFIDS, migraines, TMJ syndrome, and seasonal environmental allergies. IC flare ups can mimic the symptoms of UTI with no true bacterial infection being present. Any acute exacerbation of symptoms warrants evaluation with a urine culture.  Common triggers for IC flare ups: Certain beverages and foods which irritate the bladder This can be very patient specific See IC diet for list of common dietary irritants Physical stress Emotional / psychological stress Activities that put pressure on the pelvic area/ perineum (such as sexual intercourse, long car rides, etc) Seasonal environmental allergens  IC treatment options (for daily/ routine use): 1. Patient efforts to minimize life stressors: a. Educate employer/ support people about  condition and related care needs b. Practice self care b. May benefit from mental health treatment with primary care provider or psychiatrist/ psychologist / therapist 2. For dysfunction of inner bladder lining (glycosaminoglycan (GAG) layer): a. Prescription medications: - Oral medication: Pentosan polysulfate (Elmiron) > May be cost prohibitive; not covered well by some insurance providers. Can be ordered via a compounding pharmacy as an alternative. > Requires 6 months of daily use (3 times per day) to achieve full efficacy and to assess symptomatic response. > Requires routine annual eye exam. b. Medications instilled directly into the bladder through a catheter: - Pentosan polysulfate (Elmiron) - Heparin 3. For intrinsic abnormality of the urine: Identification and avoidance of dietary bladder irritants (commonly includes caffeine, alcohol, citric acid, spicy foods, acidic foods). For histamine-related bladder inflammation/ irritation: Antihistamine medication(s): Minimizes the uptake of histamine molecules Over-the-counter medications: Zyrtec (Cetirizine) and Xyzal (Levocetirizine) are preferred Prescription medications: Hydroxyzine (Vistaril / Atarax) Mast cell stabilizer medication:  Montelukast (Singulair): Inhibits leukotriene receptors present in the bladder, thus preventing the activation of mast cells (which release pro-inflammatory mediators) For neuropathic pain/ nerve up-regulation, which results in inappropriately elevated pain signaling from the nerves in/around bladder to the brain: Prescription medications:  Such as: Amitriptyline (Elavil), Nortriptyline (Pamelor), Trazodone, Cymbalta (Duloxetine), Gabapentin (Neurontin), Lyrica (Pregabalin)  IC treatment options for flare ups (for on-demand use): Urinary analgesics for burning / painful urination Over-the-counter medications: Phenazopyradine (Pyridium). Use should be limited to no more than 3 days consecutively due to  risk for methemoglobinemia and damage to liver & bone health. Prescription medications: Uribel, Urogesic blue, Uro-MP, Urelle, etc. Can be take every 6 hours as needed May be cost prohibitive; not covered well by some insurance providers Bladder instillations: Placement of a numbing medication (such as Lidocaine + sodium bicarbonate or Bupivacaine) through a catheter into the bladder. Can be done in-office at a nurse visit or at home Note: These medications are currently in short supply nationwide and may be difficult to obtain until the shortage improves. Can add on an  over-the-counter antihistamine or TUMS (if not already taking)  Management options for high-tone pelvic floor dysfunction secondary to IC: Stretching, relaxation techniques (such as deep breathing/ meditation), yoga, avoidance of Kegels or other activities which strain or tighten pelvic floor muscles Pelvic floor physical therapy Oral muscle relaxer medications such as Baclofen (Lioresal), Methocarbamol (Robaxin), Tizanadine (Zanaflex), Cyclobenzaprine (Flexeril) Vaginal or rectal Diazepam (Valium) suppositories Trigger point injections Pelvic nerve blocks  Additional IC treatment options: Some patients find benefit from use of baking soda to reduce the acidity of the urine. Can dissolve half a teaspoon in a full glass of water and drink twice a day. Increased water intake can dilute urine concentration - may help by reducing urine acidity.  Supplements such as Bladder Renew, CystoProtek, Cysta-Q, Prelief, Freeze-dried aloe vera, Marshmallow root, etc. These have not been evaluated by the FDA. Bladder Renew, CystoProtek, Cysta-Q: Sometimes used as an alternative to Elmiron. Prelief is an over the counter medication available at most pharmacies. Prelief works to remove acid from highly acidic foods such as tomato sauce, orange juice, coffee, and wine. Prelief can be added directly to these foods and helps to reduce bladder pain  and urinary urgency. Cystoscopy with hydrodistention  a. This is an outpatient surgical procedure done in the OR under anesthesia for diagnostic benefit and  potential therapeutic benefit. It helps clinicians rule out other conditions, assess/ treat pain triggers such as Hunner's ulcers in the bladder, and evaluate bladder capacity (size). There is approximately 60% chance of symptomatic improvement with hydrodistention; the response can be quite variable. Rare surgical interventions Suprapubic catheter placement Bladder removal (cystectomy) with urinary diversion (such as ileal conduit, [place] pouch, or neobladder creation)       Common triggers for IC flare ups: Certain beverages and foods which irritate the bladder This can be very patient specific See IC diet for list of common dietary irritants Physical stress Emotional / psychological stress Activities that put pressure on the pelvic area I perineum (such as sexual intercourse, long car rides, etc) Seasonal environmental allergens Urinary tract infection (UTI). You can call the urology office [phone]) to request lab order for urine testing if desired. May require antibiotic therapy if deemed appropriate based on urine culture result. Post-infectious (post-UTI) bladder inflammation. Can take several days or weeks to resolve.  IC treatment options for flare ups (for on-demand use):  Urinary analgesics for burning / painful urination Over-the-counter medications:  Phenazopyradine (Pyridium). Commonly known under the AZO brand name. Use should be limited to no more than 3 days consecutively due to risk for methemoglobinemia and damage to liver & bone health. Prescription medications: Uribel, Urogesic blue, Uro-MP, Urelle, etc. Can be take every 6 hours as needed May be cost prohibitive; not covered well by some insurance providers Antihistamine use to minimize histamine-mediated bladder inflammation / pain If taking  Hydroxyzine (a prescription antihistamine), may temporarily increase dose at bedtime as directed by provider. Can add on an over-the-counter antihistamine (Zyrtec or Xyzal preferred). Bladder instillations: Placement of a numbing medication (such as Lidocaine + sodium bicarbonate or Bupivacaine) through a catheter into the bladder.  b. Can be done in-office at a nurse visit or at home.  c. Note: These medications are currently in short supply nationwide and may be difficult to obtain until the shortage improves.  Other: If symptoms fail to improve in a timely fashion, could also consider cystoscopy with hydrodistention for potential diagnostic and therapeutic benefit.        INTERSTITIAL CYSTITIS DIET  Many  people with Interstitial Cystitis find that simple changes in their diet can help to control IC symptoms and avoid IC flare-ups. Typically, avoiding foods high in acid and potassium, as well as beverages containing caffeine and alcohol, is a good idea. This helpful guide can help you make "IC-Smart" meal choices. Keep it handy for easy reference when dining out or when preparing meals at home.  FRUITS: Allowable - Blueberries, Melons (except cantaloupe), Min[place]id Acid-Reduced Orange Juice (this can be diluted using water or Prelief* to prevent a flare-up) and Pears.  Avoidable -All other Fruits and Juices.  VEGETABLES:  Allowable - Homegrown Tomatoes, [place], Alfalfa, Beet, Broccoli, Bok Choy, [address], [place], [place], [place], [place], [place], [place], [place], [place], Mustard [place] and [employer].  Avoidable - Store-bought Tomatoes, Onions, Tofu, Soybeans, Lima beans, Fava Beans, Asparagus, Beet Greens, [place], [place], [place], [place], [place], [place], Mushrooms, [place], [place], [place], [place], [employer] and Rhubarb.  MEATS/FISH: Allowable - Beef, Chicken, [place], [place], [address], [address], [place], [place], [place], [place], [place], [place], [place], [place], [place], [place] and Shrimp.  Avoidable - Chicken Liver, Corned Beef, Pickled Herring and Fresh Herring, Aged, [address], Cured, Processed or Smoked Meats/Fish, [place], [place], [address] [place], [place], [place], [place], [place], [place], [place] and meats that contain nitrates or nitrites.  CARBS/GRAINS:  Allowable - Pasta, Rice, Potatoes, White Bread, Bread made of Rice, Millet, Quinoa, Buckwheat, [place], Refined, Cooked or Ready to Eat Cereal.  Avoidable - Rye Bread, Sourdough Breads and Fresh Baked Yeast Products  FATS:    DAIRY: Allowable - Butter, Margarine, Vegetable Oils, Almonds, Cashews and Pine Nuts. Avoidable - Any other Nuts, Mayonnaise, Salad Dressing. Allowable - Milk, American Cheese, [address], Cream Cheese, String Cheese, [place], Ricotta Cheese, Frozen Yogurt and White Chocolate.   Avoidable - Yogurt, Goat's Milk, Chocolate Milk, Sour Cream, Aged Cheeses, Chocolate, [place], Camembert, Cheddar, [address], [place], [place], [place], [place], [place], [place], [place], [place] and [place].  SOUPS: Allowable - Soups made with allowed Vegetables, Cream of Broccoli, Water Cress Soup, Cream of Cauliflower Soup.  Avoidable -Any packaged Soups.  SEASONINGS:  Allowable - Garlic and other seasonings not listed below.  Avoid - Miso, Soy Sauce, Vinegar and any Spicy Foods (especially [place], [place], [place] and [place] foods)  BEVERAGES:  Allowable - Bottled or Spring Water, Decaffeinated, Acid-free or Low Acid Coffee or Tea, Alfalfa Leaf Tea, Sun Tea, Kava Instant Coffee, Peppermint Tea, Golden Seal Tea, Flat Soda and Low Acid Wines.  Avoid -Alcoholic Beverages (except Low Acid Wines), Carbonated Drinks such as Soda, Coffee and Tea (except what is listed above), Fruit Juices and Chamomile Tea.  PRESERVATIVE: Avoid - Benzol Alcohol, Citric Acid, Monosodium Glutamate (MSG), Aspartame, Saccharin and foods containing Artificial Ingredients/Colors.        Elmiron  disclosure:  Elmiron is approved by the FDA and AUA as a therapeutic option for Interstitial Cystitis / Painful Bladder Syndrome (IC/BPS). Mechanism of action: Elmiron is a synthetic polysaccharide that binds to the urothelium, providing a protective layer that may help prevent painful irritation from components of the urine. Common side effects may include: Headache, GI upset, hair loss. It may take up to six months of daily Elmiron use to assess full therapeutic effect on the bladder.  Issue:  Over the past few years there have been new concerns regarding possible development of vision problems (maculopathy/ retinopathy) with Elmiron use. Possible symptoms may include difficulty with reading, blurred vision, and prolonged adjustment to darkness. Based on research study findings so far: There is evidence of correlation  but not causation.  It is unclear if this specific maculopathy / retinopathy is truly a drug-associated toxicity due to Elmiron use in patients with IC versus a potential clinical manifestation of IC itself. It is unclear if length and/or quantity of Elmiron exposure correlates with the development and/or severity degree of toxicity. Impact:  Although no definitive evidence exists that Elmiron causes vision problems, patients who are currently taking Elmiron or have previously taken Elmiron are strongly advised to have annual routine eye exams and to discuss Elmiron use with their eye doctor.  Elmiron continues to be approved by the FDA and AUA as a therapeutic option for IC. Currently there are no alternative oral prescription medications which are FDA approved with the same mechanism of action.  Alternatives to oral Elmiron use include: Elmiron instillations via catheter directly into the bladder (at home or at nurse visit in office) Heparin instillations via catheter directly into the bladder (at home or at nurse visit in office) Use of over-the-counter (non-prescription)  supplements such as Cystoprotek and Cysta-Q. These have not been evaluated by the FDA, therefore the efficacy, safety, and mechanism of action are unknown.   Additional information:  Per the American Urological Association 2022 guideline for Diagnosis and Treatment of Interstitial Cystitis/Bladder Pain Syndrome: Amitriptyline, cimetidine, hydroxyzine, or pentosan polysulfate (Elmiron) may be administered as oral medications (listed in alphabetical order; no hierarchy is implied). Clinicians should counsel patients who are considering pentosan polysulfate about the potential risk for macular damage and vision-related injuries. [employer] cystitis/bladder-pain-syndrome-(2022)  Statement in The Journal of Urology, Volume 208, Issue 6, December 2022: "Regarding pentosan polysulfate (PPS), it remains as the only U.S. Food and Drug Administration (FDA) approved oral treatment for IC/BPS. The original guideline noted uncertainty about the balance between risks and benefits, and therefore listed PPS as an option. We agree the balance has now shifted due to the associated maculopathy and black box warning from the FDA, which are outlined in the new statement #15. Clearly, this new evidence should be included in patient counseling discussions. However, certain patients may still opt for a trial of PPS, and if symptoms improve significantly then they may choose to continue PPS with close ophthalmologic monitoring. At this point we do not believe the risk/benefit balance would justify removing PPS altogether from the list of options, especially given its ongoing endorsement by the FDA." Per FDA: "Pigment changes in the retina of the eye (also referred to as pigmentary maculopathy in medical journal articles) have been reported with long-term use of ELMIRON. While the cause of the pigmentary changes is unclear, continued long term  dosing with ELMIRON may be a risk factor. The consequences of these pigmentary changes in the retina are not fully understood. Visual symptoms that have been reported include: difficulty reading, slow adjustment to low or reduced light environments, and blurred vision. If you already have retinal pigment changes from other causes, it may be difficult to distinguish future retinal pigment changes if they occur. Call your doctor (including your eye doctor) if you notice any changes in your vision. Throughout your treatment, regular eye examinations that include retinal examinations are suggested for early detection of retinal/macular changes. Your doctor will discuss with you when to get your first eye examination and follow up exams, and whether the treatment should be continued since these changes may be irreversible and may progress even after stopping treatment." [email or web address] [email or web address]/[email or web address]

## 2023-07-24 NOTE — Progress Notes (Signed)
post void residual = 0 ml

## 2023-08-06 ENCOUNTER — Other Ambulatory Visit: Payer: Self-pay | Admitting: Obstetrics and Gynecology

## 2023-08-06 DIAGNOSIS — M6289 Other specified disorders of muscle: Secondary | ICD-10-CM

## 2023-08-06 DIAGNOSIS — M62838 Other muscle spasm: Secondary | ICD-10-CM

## 2023-08-08 DIAGNOSIS — M5416 Radiculopathy, lumbar region: Secondary | ICD-10-CM | POA: Diagnosis not present

## 2023-08-08 DIAGNOSIS — G894 Chronic pain syndrome: Secondary | ICD-10-CM | POA: Diagnosis not present

## 2023-08-13 NOTE — Telephone Encounter (Signed)
Attempted to contact patient.

## 2023-08-15 ENCOUNTER — Other Ambulatory Visit: Payer: Self-pay | Admitting: Obstetrics and Gynecology

## 2023-08-15 DIAGNOSIS — M6289 Other specified disorders of muscle: Secondary | ICD-10-CM

## 2023-08-15 DIAGNOSIS — M62838 Other muscle spasm: Secondary | ICD-10-CM

## 2023-08-15 NOTE — Telephone Encounter (Signed)
3rd attempt to contact.

## 2023-08-20 ENCOUNTER — Ambulatory Visit: Payer: 59

## 2023-08-20 VITALS — Ht 62.5 in | Wt 158.0 lb

## 2023-08-20 DIAGNOSIS — Z0000 Encounter for general adult medical examination without abnormal findings: Secondary | ICD-10-CM

## 2023-08-20 NOTE — Patient Instructions (Addendum)
[name] [name] , Thank you for taking time to come for your Medicare Wellness Visit. I appreciate your ongoing commitment to your health goals. Please review the following plan we discussed and let me know if I can assist you in the future.   Referrals/Orders/Follow-Ups/Clinician Recommendations:   This is a list of the screening recommended for you and due dates:  Health Maintenance  Topic Date Due   Flu Shot  04/26/2023   Colon Cancer Screening  01/31/2024   Medicare Annual Wellness Visit  08/19/2024   Mammogram  05/06/2025   DTaP/Tdap/Td vaccine (6 - Td or Tdap) 06/10/2031   Pneumonia Vaccine  Completed   DEXA scan (bone density measurement)  Completed   Hepatitis C Screening  Completed   Zoster (Shingles) Vaccine  Completed   HPV Vaccine  Aged Out   COVID-19 Vaccine  Discontinued   Opioid Pain Medicine Management Opioids are powerful medicines that are used to treat moderate to severe pain. When used for short periods of time, they can help you to: Sleep better. Do better in physical or occupational therapy. Feel better in the first few days after an injury. Recover from surgery. Opioids should be taken with the supervision of a trained health care provider. They should be taken for the shortest period of time possible. This is because opioids can be addictive, and the longer you take opioids, the greater your risk of addiction. This addiction can also be called opioid use disorder. What are the risks? Using opioid pain medicines for longer than 3 days increases your risk of side effects. Side effects include: Constipation. Nausea and vomiting. Breathing difficulties (respiratory depression). Drowsiness. Confusion. Opioid use disorder. Itching. Taking opioid pain medicine for a long period of time can affect your ability to do daily tasks. It also puts you at risk for: Motor vehicle crashes. Depression. Suicide. Heart attack. Overdose, which can be life-threatening. What is  a pain treatment plan? A pain treatment plan is an agreement between you and your health care provider. Pain is unique to each person, and treatments vary depending on your condition. To manage your pain, you and your health care provider need to work together. To help you do this: Discuss the goals of your treatment, including how much pain you might expect to have and how you will manage the pain. Review the risks and benefits of taking opioid medicines. Remember that a good treatment plan uses more than one approach and minimizes the chance of side effects. Be honest about the amount of medicines you take and about any drug or alcohol use. Get pain medicine prescriptions from only one health care provider. Pain can be managed with many types of alternative treatments. Ask your health care provider to refer you to one or more specialists who can help you manage pain through: Physical or occupational therapy. Counseling (cognitive behavioral therapy). Good nutrition. Biofeedback. Massage. Meditation. Non-opioid medicine. Following a gentle exercise program. How to use opioid pain medicine Taking medicine Take your pain medicine exactly as told by your health care provider. Take it only when you need it. If your pain gets less severe, you may take less than your prescribed dose if your health care provider approves. If you are not having pain, do nottake pain medicine unless your health care provider tells you to take it. If your pain is severe, do nottry to treat it yourself by taking more pills than instructed on your prescription. Contact your health care provider for help. Write down the  times when you take your pain medicine. It is easy to become confused while on pain medicine. Writing the time can help you avoid overdose. Take other over-the-counter or prescription medicines only as told by your health care provider. Keeping yourself and others safe  While you are taking opioid pain  medicine: Do not drive, use machinery, or power tools. Do not sign legal documents. Do not drink alcohol. Do not take sleeping pills. Do not supervise children by yourself. Do not do activities that require climbing or being in high places. Do not go to a lake, river, ocean, spa, or swimming pool. Do not share your pain medicine with anyone. Keep pain medicine in a locked cabinet or in a secure area where pets and children cannot reach it. Stopping your use of opioids If you have been taking opioid medicine for more than a few weeks, you may need to slowly decrease (taper) how much you take until you stop completely. Tapering your use of opioids can decrease your risk of symptoms of withdrawal, such as: Pain and cramping in the abdomen. Nausea. Sweating. Sleepiness. Restlessness. Uncontrollable shaking (tremors). Cravings for the medicine. Do not attempt to taper your use of opioids on your own. Talk with your health care provider about how to do this. Your health care provider may prescribe a step-down schedule based on how much medicine you are taking and how long you have been taking it. Getting rid of leftover pills Do not save any leftover pills. Get rid of leftover pills safely by: Taking the medicine to a prescription take-back program. This is usually offered by the county or law enforcement. Bringing them to a pharmacy that has a drug disposal container. Flushing them down the toilet. Check the label or package insert of your medicine to see whether this is safe to do. Throwing them out in the trash. Check the label or package insert of your medicine to see whether this is safe to do. If it is safe to throw it out, remove the medicine from the original container, put it into a sealable bag or container, and mix it with used coffee grounds, food scraps, dirt, or cat litter before putting it in the trash. Follow these instructions at home: Activity Do exercises as told by your  health care provider. Avoid activities that make your pain worse. Return to your normal activities as told by your health care provider. Ask your health care provider what activities are safe for you. General instructions You may need to take these actions to prevent or treat constipation: Drink enough fluid to keep your urine pale yellow. Take over-the-counter or prescription medicines. Eat foods that are high in fiber, such as beans, whole grains, and fresh fruits and vegetables. Limit foods that are high in fat and processed sugars, such as fried or sweet foods. Keep all follow-up visits. This is important. Where to find support If you have been taking opioids for a long time, you may benefit from receiving support for quitting from a local support group or counselor. Ask your health care provider for a referral to these resources in your area. Where to find more information Centers for Disease Control and Prevention (CDC): FootballExhibition.com.br [place] Food and Drug Administration (FDA): PumpkinSearch.com.ee Get help right away if: You may have taken too much of an opioid (overdosed). Common symptoms of an overdose: Your breathing is slower or more shallow than normal. You have a very slow heartbeat (pulse). You have slurred speech. You have nausea and  vomiting. Your pupils become very small. You have other potential symptoms: You are very confused. You faint or feel like you will faint. You have cold, clammy skin. You have blue lips or fingernails. You have thoughts of harming yourself or harming others. These symptoms may represent a serious problem that is an emergency. Do not wait to see if the symptoms will go away. Get medical help right away. Call your local emergency services (911 in the [place] Do not drive yourself to the hospital.  If you ever feel like you may hurt yourself or others, or have thoughts about taking your own life, get help right away. Go to your nearest emergency department  or: Call your local emergency services (911 in the [place] Call the [facility] ((716)008-0990 in the [place] Call a suicide crisis helpline, such as the National Suicide Prevention Lifeline at (740)280-8104 or 988 in the [place] This is open 24 hours a day in the [place] Text the Crisis Text Line at 951-441-7028 (in the [place] Summary Opioid medicines can help you manage moderate to severe pain for a short period of time. A pain treatment plan is an agreement between you and your health care provider. Discuss the goals of your treatment, including how much pain you might expect to have and how you will manage the pain. If you think that you or someone else may have taken too much of an opioid, get medical help right away. This information is not intended to replace advice given to you by your health care provider. Make sure you discuss any questions you have with your health care provider. Document Revised: 04/06/2021 Document Reviewed: 12/22/2020 [employer] Patient Education  2024 [employer]  Advanced directives: (Declined) Advance directive discussed with you today. Even though you declined this today, please call our office should you change your mind, and we can give you the proper paperwork for you to fill out.  Next Medicare Annual Wellness Visit scheduled for next year: Yes
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
## 2023-08-27 ENCOUNTER — Ambulatory Visit: Payer: 59 | Admitting: Family Medicine

## 2023-08-27 ENCOUNTER — Other Ambulatory Visit: Payer: 59 | Admitting: Urology

## 2023-08-29 ENCOUNTER — Ambulatory Visit: Payer: 59 | Admitting: Family Medicine

## 2023-08-30 ENCOUNTER — Ambulatory Visit: Payer: 59 | Admitting: Family Medicine

## 2023-08-30 ENCOUNTER — Encounter: Payer: Self-pay | Admitting: Family Medicine

## 2023-08-30 VITALS — BP 110/72 | HR 86 | Temp 98.2°F | Wt 160.4 lb

## 2023-08-30 DIAGNOSIS — R102 Pelvic and perineal pain: Secondary | ICD-10-CM

## 2023-08-30 DIAGNOSIS — R413 Other amnesia: Secondary | ICD-10-CM

## 2023-08-30 DIAGNOSIS — G8929 Other chronic pain: Secondary | ICD-10-CM | POA: Diagnosis not present

## 2023-08-30 DIAGNOSIS — R350 Frequency of micturition: Secondary | ICD-10-CM | POA: Diagnosis not present

## 2023-08-30 DIAGNOSIS — F341 Dysthymic disorder: Secondary | ICD-10-CM

## 2023-08-30 LAB — POC URINALSYSI DIPSTICK (AUTOMATED)
Bilirubin, UA: NEGATIVE
Blood, UA: NEGATIVE
Glucose, UA: NEGATIVE
Ketones, UA: NEGATIVE
Leukocytes, UA: NEGATIVE
Nitrite, UA: NEGATIVE
Protein, UA: NEGATIVE
Spec Grav, UA: 1.025 (ref 1.010–1.025)
Urobilinogen, UA: 0.2 U/dL
pH, UA: 5 (ref 5.0–8.0)

## 2023-08-30 MED ORDER — DONEPEZIL HCL 5 MG PO TABS: 5.0000 mg | ORAL_TABLET | Freq: Every day | ORAL | 0 refills | Status: DC

## 2023-08-30 MED ORDER — ALPRAZOLAM 1 MG PO TABS: 1.0000 mg | ORAL_TABLET | Freq: Every evening | ORAL | 0 refills | Status: AC | PRN

## 2023-08-30 NOTE — Progress Notes (Signed)
   Subjective:    Patient ID: [name] [name], female    DOB: 07-Oct-1955, 67 y.o.   MRN: [id number]  HPI Here for several issues. She asks [place] to check her for a possible UTI because she has been having urinary urgency and low back pain for the last week. No fever. Of note she sees Urology for interstitial cystitis. She uses vaginal Valium suppositories and vaginal estrogen cream, and she is getting pelvic floor PT. She takes Xanax for anxiety, and she asks for refills. Finally she wants to discuss her memory. She says her memory has been getting worse over the past year, and she often has trouble organizing her thoughts. She has been tested several times with what sounds like a MMSE, and she always scores "normal".    Review of Systems  Constitutional: Negative.   Respiratory: Negative.    Cardiovascular: Negative.   Gastrointestinal: Negative.   Genitourinary:  Positive for pelvic pain and urgency. Negative for dysuria, flank pain and frequency.  Psychiatric/Behavioral:  Positive for dysphoric mood. Negative for sleep disturbance. The patient is nervous/anxious.        Objective:   Physical Exam Constitutional:      Appearance: Normal appearance.  Cardiovascular:     Rate and Rhythm: Normal rate and regular rhythm.     Pulses: Normal pulses.     Heart sounds: Normal heart sounds.  Pulmonary:     Effort: Pulmonary effort is normal.     Breath sounds: Normal breath sounds.  Abdominal:     Tenderness: There is no right CVA tenderness or left CVA tenderness.  Neurological:     General: No focal deficit present.     Mental Status: She is alert and oriented to person, place, and time.  Psychiatric:        Mood and Affect: Mood normal.        Behavior: Behavior normal.        Thought Content: Thought content normal.           Assessment & Plan:  Her anxiety is stable, so we refilled the Xanax. Her UA today is clear so she does not have a UTI. She will follow up with Urology for  the IC. As for mild memory loss, she will try Donepezil 5 mg daily for 30 days. Follow up in 30 days. We spent a total of ( 33  ) minutes reviewing records and discussing these issues.  Gershon Crane, MD

## 2023-09-05 DIAGNOSIS — M5416 Radiculopathy, lumbar region: Secondary | ICD-10-CM | POA: Diagnosis not present

## 2023-09-05 DIAGNOSIS — G894 Chronic pain syndrome: Secondary | ICD-10-CM | POA: Diagnosis not present

## 2023-09-21 ENCOUNTER — Other Ambulatory Visit: Payer: Self-pay | Admitting: Family Medicine

## 2023-09-21 DIAGNOSIS — E538 Deficiency of other specified B group vitamins: Secondary | ICD-10-CM

## 2023-09-21 NOTE — Telephone Encounter (Unsigned)
Copied from CRM [id number]. Topic: Clinical - Medication Refill >> Sep 21, 2023  2:54 PM Corin V wrote: Most Recent Primary Care Visit:  Provider: Gershon Crane A  Department: [facility]  Visit Type: OFFICE VISIT  Date: 08/30/2023  Medication: donepezil (ARICEPT) 5 MG tablet  Has the patient contacted their pharmacy? {yes/no:20286} (Agent: If no, request that the patient contact the pharmacy for the refill. If patient does not wish to contact the pharmacy document the reason why and proceed with request.) (Agent: If yes, when and what did the pharmacy advise?)  Is this the correct pharmacy for this prescription? {yes/no:20286} If no, delete pharmacy and type the correct one.  This is the patient's preferred pharmacy:  [phone] - [place], [place] - [address] [place] [address] Phone: [phone] Fax: [phone]  [facility] [place], [place] - [address] [place] [address] Phone: [phone] Fax: [phone]   Has the prescription been filled recently? {yes/no:20286}  Is the patient out of the medication? {yes/no:20286}  Has the patient been seen for an appointment in the last year OR does the patient have an upcoming appointment? {yes/no:20286}  Can we respond through MyChart? {yes/no:20286}  Agent: Please be advised that Rx refills may take up to 3 business days. We ask that you follow-up with your pharmacy.

## 2023-09-22 ENCOUNTER — Other Ambulatory Visit: Payer: Self-pay | Admitting: Family Medicine

## 2023-09-22 DIAGNOSIS — E538 Deficiency of other specified B group vitamins: Secondary | ICD-10-CM

## 2023-09-24 ENCOUNTER — Telehealth: Payer: Self-pay

## 2023-09-24 NOTE — Telephone Encounter (Signed)
Copied from CRM [id number]. Topic: Clinical - Medication Refill >> Sep 21, 2023  2:54 PM Corin V wrote: Most Recent Primary Care Visit:  Provider: Gershon Crane A  Department: [facility]  Visit Type: OFFICE VISIT  Date: 08/30/2023  Medication: donepezil (ARICEPT) 5 MG tablet cyanocobalamin (VITAMIN B12) 1000 MCG/ML injection  Has the patient contacted their pharmacy? Yes (Agent: If no, request that the patient contact the pharmacy for the refill. If patient does not wish to contact the pharmacy document the reason why and proceed with request.) (Agent: If yes, when and what did the pharmacy advise?)  Is this the correct pharmacy for this prescription? Yes If no, delete pharmacy and type the correct one.  This is the patient's preferred pharmacy:  [facility] - [place], [place] - [address] [place] [address] Phone: [phone] Fax: [phone]   Has the prescription been filled recently? No  Is the patient out of the medication? Yes  Has the patient been seen for an appointment in the last year OR does the patient have an upcoming appointment? Yes  Can we respond through MyChart? No  Agent: Please be advised that Rx refills may take up to 3 business days. We ask that you follow-up with your pharmacy.

## 2023-09-24 NOTE — Telephone Encounter (Signed)
Rx request pending.  Waiting for provider to address.

## 2023-10-01 ENCOUNTER — Encounter: Payer: 59 | Admitting: Physical Therapy

## 2023-10-04 DIAGNOSIS — G894 Chronic pain syndrome: Secondary | ICD-10-CM | POA: Diagnosis not present

## 2023-10-04 DIAGNOSIS — M5416 Radiculopathy, lumbar region: Secondary | ICD-10-CM | POA: Diagnosis not present

## 2023-10-10 ENCOUNTER — Encounter: Payer: Self-pay | Admitting: Physical Therapy

## 2023-10-10 ENCOUNTER — Ambulatory Visit: Payer: 59 | Attending: Obstetrics and Gynecology | Admitting: Physical Therapy

## 2023-10-10 DIAGNOSIS — R252 Cramp and spasm: Secondary | ICD-10-CM | POA: Insufficient documentation

## 2023-10-10 DIAGNOSIS — R102 Pelvic and perineal pain: Secondary | ICD-10-CM | POA: Insufficient documentation
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
## 2023-10-10 NOTE — Patient Instructions (Addendum)
 Bladder Irritants  Certain foods and beverages can be irritating to the bladder.  Avoiding these irritants may decrease your symptoms of urinary urgency, frequency or bladder pain.  Even reducing your intake can help with your symptoms.  Not everyone is sensitive to all bladder irritants, so you may consider focusing on one irritant at a time, removing or reducing your intake of that irritant for 7-10 days to see if this change helps your symptoms.  Water intake is also very important.  Below is a list of bladder irritants.  Drinks: alcohol, carbonated beverages, caffeinated beverages such as coffee and tea, drinks with artificial sweeteners, citrus juices, apple juice, tomato juice  Foods: tomatoes and tomato based foods, spicy food, sugar and artificial sweeteners, vinegar, chocolate, raw onion, apples, citrus fruits, pineapple, cranberries, tomatoes, strawberries, plums, peaches, cantaloupe  Other: acidic urine (too concentrated) - see water intake info below  Substitutes you can try that are NOT irritating to the bladder: cooked onion, pears, papayas, sun-brewed decaf teas, watermelons, non-citrus herbal teas, apricots, kava and low-acid instant drinks (Postum).    WATER INTAKE: Remember to drink lots of water (aim for fluid intake of half your body weight with 2/3 of fluids being water).  You may be limiting fluids due to fear of leakage, but this can actually worsen urgency symptoms due to highly concentrated urine.  Water helps balance the pH of your urine so it doesn't become too acidic - acidic urine is a bladder irritant!   [facility] [address], [address] [place], [place] [address] Phone # [phone] Fax [phone]    About Abdominal Massage  Abdominal massage, also called external colon massage, is a self-treatment circular massage technique that can reduce and eliminate gas and ease constipation. The colon naturally contracts in waves in a  clockwise direction starting from inside the right hip, moving up toward the ribs, across the belly, and down inside the left hip.  When you perform circular abdominal massage, you help stimulate your colon's normal wave pattern of movement called peristalsis.  It is most beneficial when done after eating.  Positioning You can practice abdominal massage with oil while lying down, or in the shower with soap.  Some people find that it is just as effective to do the massage through clothing while sitting or standing.  How to Massage Start by placing your finger tips or knuckles on your right side, just inside your hip bone.  Make small circular movements while you move upward toward your rib cage.   Once you reach the bottom right side of your rib cage, take your circular movements across to the left side of the bottom of your rib cage.  Next, move downward until you reach the inside of your left hip bone.  This is the path your feces travel in your colon. Continue to perform your abdominal massage in this pattern for 10 minutes each day.     You can apply as much pressure as is comfortable in your massage.  Start gently and build pressure as you continue to practice.  Notice any areas of pain as you massage; areas of slight pain may be relieved as you massage, but if you have areas of significant or intense pain, consult with your healthcare provider.  Other Considerations General physical activity including bending and stretching can have a beneficial massage-like effect on the colon.  Deep breathing can also stimulate the colon because breathing deeply activates the same nervous system that supplies the  colon.   Abdominal massage should always be used in combination with a bowel-conscious diet that is high in the proper type of fiber for you, fluids (primarily water), and a regular exercise program.

## 2023-10-12 ENCOUNTER — Other Ambulatory Visit: Payer: Self-pay | Admitting: Urology

## 2023-10-12 DIAGNOSIS — K589 Irritable bowel syndrome without diarrhea: Secondary | ICD-10-CM

## 2023-10-12 DIAGNOSIS — N3289 Other specified disorders of bladder: Secondary | ICD-10-CM

## 2023-10-15 ENCOUNTER — Encounter: Payer: 59 | Admitting: Physical Therapy

## 2023-10-15 ENCOUNTER — Ambulatory Visit (INDEPENDENT_AMBULATORY_CARE_PROVIDER_SITE_OTHER): Payer: 59 | Admitting: Urology

## 2023-10-15 DIAGNOSIS — R3915 Urgency of urination: Secondary | ICD-10-CM | POA: Diagnosis not present

## 2023-10-15 DIAGNOSIS — N3010 Interstitial cystitis (chronic) without hematuria: Secondary | ICD-10-CM

## 2023-10-15 DIAGNOSIS — N200 Calculus of kidney: Secondary | ICD-10-CM

## 2023-10-15 DIAGNOSIS — R350 Frequency of micturition: Secondary | ICD-10-CM | POA: Diagnosis not present

## 2023-10-15 DIAGNOSIS — M62838 Other muscle spasm: Secondary | ICD-10-CM

## 2023-10-15 DIAGNOSIS — R102 Pelvic and perineal pain: Secondary | ICD-10-CM | POA: Diagnosis not present

## 2023-10-15 DIAGNOSIS — M6289 Other specified disorders of muscle: Secondary | ICD-10-CM

## 2023-10-15 MED ORDER — DIAZEPAM 5 MG PO TABS: ORAL_TABLET | ORAL | 0 refills | Status: DC

## 2023-10-15 NOTE — Progress Notes (Signed)
  [place] 10/15/23  CC: No chief complaint on file.   HPI: Pt saw PA Sarah for frequency, urgency symptoms. Pelvic pain. On TV estrace, TV diazepam. Hydroxyzine, bentyl. Elmiron, meloxicam, viibryd, oxycodone.   Today here for cystoscopy and request TV valium refill. Her frequency and urgency is improved. UA clear. Pelvic pain but no dysuria or gross hematuria.   There were no vitals taken for this visit. NED. A&Ox3.   No respiratory distress   Abd soft, NT, ND Normal external genitalia with patent urethral meatus. Good support. No prolapse.   Chaperone for exam and cystoscopy - Alisheya   Cystoscopy Procedure Note  Patient identification was confirmed, informed consent was obtained, and patient was prepped using Betadine solution.  Lidocaine jelly was administered per urethral meatus.    Procedure: - Flexible cystoscope introduced, without any difficulty.   - Thorough search of the bladder revealed:    normal urethral meatus    normal urothelium    no stones    no ulcers     no tumors    no urethral polyps    no trabeculation  - Ureteral orifices were normal in position and appearance.  Post-Procedure: - Patient tolerated the procedure well  Assessment/ Plan:  IC, freq, urgency, pelvic pain - TV diazepam refilled for PRN use.    No follow-ups on file.  Jerilee Field, MD

## 2023-10-16 LAB — URINALYSIS, ROUTINE W REFLEX MICROSCOPIC
Bilirubin, UA: NEGATIVE
Glucose, UA: NEGATIVE
Ketones, UA: NEGATIVE
Leukocytes,UA: NEGATIVE
Nitrite, UA: NEGATIVE
Protein,UA: NEGATIVE
RBC, UA: NEGATIVE
Specific Gravity, UA: 1.02 (ref 1.005–1.030)
Urobilinogen, Ur: 0.2 mg/dL (ref 0.2–1.0)
pH, UA: 6 (ref 5.0–7.5)

## 2023-10-17 ENCOUNTER — Other Ambulatory Visit: Payer: Self-pay | Admitting: *Deleted

## 2023-10-17 DIAGNOSIS — E538 Deficiency of other specified B group vitamins: Secondary | ICD-10-CM

## 2023-10-17 MED ORDER — CYANOCOBALAMIN 1000 MCG/ML IJ SOLN: 1000.0000 ug | INTRAMUSCULAR | 2 refills | Status: DC

## 2023-10-21 ENCOUNTER — Other Ambulatory Visit: Payer: Self-pay | Admitting: Family Medicine

## 2023-10-21 DIAGNOSIS — I10 Essential (primary) hypertension: Secondary | ICD-10-CM

## 2023-10-22 ENCOUNTER — Ambulatory Visit: Payer: 59 | Admitting: Physical Therapy

## 2023-10-22 NOTE — Telephone Encounter (Signed)
Rx done.

## 2023-10-22 NOTE — Telephone Encounter (Signed)
Almost 1 year since last appt, needs an appt then ok to fill until her appt date.

## 2023-10-29 ENCOUNTER — Ambulatory Visit: Payer: 59 | Attending: Obstetrics and Gynecology | Admitting: Physical Therapy

## 2023-10-29 ENCOUNTER — Telehealth: Payer: Self-pay | Admitting: Physical Therapy

## 2023-10-29 DIAGNOSIS — R102 Pelvic and perineal pain: Secondary | ICD-10-CM | POA: Insufficient documentation

## 2023-10-29 DIAGNOSIS — R252 Cramp and spasm: Secondary | ICD-10-CM | POA: Insufficient documentation

## 2023-10-29 NOTE — Telephone Encounter (Signed)
Called patient and left a message about the missed appointment today at 14:45.  [name] [name], PT @2 /3/25@ 3:07 PM

## 2023-11-02 DIAGNOSIS — Z79899 Other long term (current) drug therapy: Secondary | ICD-10-CM | POA: Diagnosis not present

## 2023-11-02 DIAGNOSIS — M5416 Radiculopathy, lumbar region: Secondary | ICD-10-CM | POA: Diagnosis not present

## 2023-11-02 DIAGNOSIS — G894 Chronic pain syndrome: Secondary | ICD-10-CM | POA: Diagnosis not present

## 2023-11-02 DIAGNOSIS — Z79891 Long term (current) use of opiate analgesic: Secondary | ICD-10-CM | POA: Diagnosis not present

## 2023-11-05 ENCOUNTER — Encounter: Payer: 59 | Admitting: Physical Therapy

## 2023-11-20 ENCOUNTER — Telehealth: Payer: Self-pay | Admitting: Family Medicine

## 2023-11-20 DIAGNOSIS — I10 Essential (primary) hypertension: Secondary | ICD-10-CM

## 2023-11-20 NOTE — Telephone Encounter (Signed)
>  1 year since last visit, she needs an appt then ok to refill the medication until her appt date
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
## 2023-11-26 ENCOUNTER — Ambulatory Visit (INDEPENDENT_AMBULATORY_CARE_PROVIDER_SITE_OTHER): Payer: 59 | Admitting: Urology

## 2023-11-26 ENCOUNTER — Encounter: Payer: Self-pay | Admitting: Urology

## 2023-11-26 DIAGNOSIS — N3010 Interstitial cystitis (chronic) without hematuria: Secondary | ICD-10-CM

## 2023-11-26 DIAGNOSIS — N3946 Mixed incontinence: Secondary | ICD-10-CM | POA: Diagnosis not present

## 2023-11-26 DIAGNOSIS — Z87442 Personal history of urinary calculi: Secondary | ICD-10-CM

## 2023-11-26 DIAGNOSIS — N952 Postmenopausal atrophic vaginitis: Secondary | ICD-10-CM | POA: Diagnosis not present

## 2023-11-26 DIAGNOSIS — N200 Calculus of kidney: Secondary | ICD-10-CM

## 2023-11-26 DIAGNOSIS — R413 Other amnesia: Secondary | ICD-10-CM

## 2023-11-27 ENCOUNTER — Telehealth: Payer: Self-pay | Admitting: Physical Therapy

## 2023-11-27 NOTE — Telephone Encounter (Signed)
 Called patient to see if she is to continue with therapy due to multiple cancellations and no-show. Left a message.  Eulis Foster, PT @3 /4/25@ 10:30 AM

## 2023-11-28 ENCOUNTER — Ambulatory Visit: Payer: 59 | Admitting: Physical Therapy

## 2023-12-03 ENCOUNTER — Ambulatory Visit: Payer: 59 | Admitting: Family Medicine

## 2023-12-04 DIAGNOSIS — M4716 Other spondylosis with myelopathy, lumbar region: Secondary | ICD-10-CM | POA: Diagnosis not present

## 2023-12-04 DIAGNOSIS — G894 Chronic pain syndrome: Secondary | ICD-10-CM | POA: Diagnosis not present

## 2023-12-05 ENCOUNTER — Ambulatory Visit: Payer: 59 | Admitting: Physical Therapy

## 2023-12-07 ENCOUNTER — Telehealth: Payer: Self-pay | Admitting: Physical Therapy

## 2023-12-07 NOTE — Telephone Encounter (Signed)
 Called patient about her next visit. Informed her that if she cancels or no shows her next visit we will only be able to schedule one visit at a time. Asked her to call back to see if she wants to continue therapy or be discharged.  Eulis Foster, PT @3 /14/25@ 8:57 AM

## 2023-12-19 ENCOUNTER — Telehealth: Payer: Self-pay | Admitting: Physical Therapy

## 2023-12-19 NOTE — Telephone Encounter (Signed)
 Called patient to confirm her appt. On 4/7 and explained out no show cancellation policy to her and asked for her to confirm her appt.  Eulis Foster, PT @3 /26/25@ 9:37 AM

## 2023-12-20 ENCOUNTER — Ambulatory Visit (INDEPENDENT_AMBULATORY_CARE_PROVIDER_SITE_OTHER): Admitting: Family Medicine

## 2023-12-20 VITALS — BP 124/80 | HR 105 | Temp 97.8°F | Wt 164.0 lb

## 2023-12-20 DIAGNOSIS — M79672 Pain in left foot: Secondary | ICD-10-CM

## 2023-12-20 DIAGNOSIS — M79671 Pain in right foot: Secondary | ICD-10-CM

## 2023-12-20 DIAGNOSIS — L84 Corns and callosities: Secondary | ICD-10-CM

## 2023-12-20 NOTE — Patient Instructions (Addendum)
-  Placed a referral to podiatry for bilateral foot pain that has changed from her usual chronic foot pain and for callus on feet. Please call the office or send a MyChart message if you have not heard back about an appointment in 2 weeks.  -Continue to take prescribed Oxycodone for your pain since it does help.  -Follow up in 1 month for a physical. Please be fasting.

## 2023-12-20 NOTE — Progress Notes (Signed)
   Established Patient Office Visit   Subjective:  Patient ID: [name] [name], female    DOB: 1956/02/25  Age: 68 y.o. MRN: [id number]  Chief Complaint  Patient presents with   Pain    HPI Patient is complaining of bilateral foot pain. She reports has chronic left foot from previous injury and surgery. Then, her right foot has started having neuropathy pain. But, she has started having a new or different type of pain in her feet. This started on Sunday morning. She reports the pain has changed by severity and edema. On Sunday morning, the pain was severe enough she could hardly bear weight on her feet. She took usual Oxycodone medication, waited for it improve. Still is painful, but the pain is bearable. She is has numbness/tingling but that is usual for her. Denies any recent injury.  ROS See HPI above     Objective:   BP 124/80   Pulse (!) 105   Temp 97.8 F (36.6 C) (Oral)   Wt 164 lb (74.4 kg)   SpO2 96%   BMI 29.52 kg/m    Physical Exam Constitutional:      General: She is not in acute distress.    Appearance: Normal appearance. She is overweight. She is not ill-appearing, toxic-appearing or diaphoretic.  HENT:     Head: Normocephalic and atraumatic.  Eyes:     General:        Right eye: No discharge.        Left eye: No discharge.     Conjunctiva/sclera: Conjunctivae normal.  Cardiovascular:     Rate and Rhythm: Tachycardia present.     Pulses:          Dorsalis pedis pulses are 2+ on the right side and 2+ on the left side.  Pulmonary:     Effort: Respiratory distress present.  Musculoskeletal:        General: Normal range of motion.     Right lower leg: No edema.     Left lower leg: No edema.  Feet:     Right foot:     Protective Sensation: 10 sites tested.  10 sites sensed.     Skin integrity: Callus and dry skin present.     Toenail Condition: Right toenails are abnormally thick.     Left foot:     Protective Sensation: 10 sites tested.  10 sites sensed.      Skin integrity: Callus and dry skin present.     Toenail Condition: Left toenails are abnormally thick.  Neurological:     Mental Status: She is alert and oriented to person, place, and time. Mental status is at baseline.  Psychiatric:        Behavior: Behavior normal.      Assessment & Plan:  Pain in both feet -     Ambulatory referral to Podiatry  Callus of foot -     Ambulatory referral to Podiatry  -Placed a referral to podiatry for bilateral foot pain that has changed from her usual chronic foot pain and for callus on feet. Please call the office or send a MyChart message if you have not heard back about an appointment in 2 weeks.  -Continue to take prescribed Oxycodone for your pain since it does help.  -Follow up in 1 month for a physical. Please be fasting.   Return in about 1 month (around 01/20/2024) for physical.   Zandra Abts, NP

## 2023-12-24 ENCOUNTER — Other Ambulatory Visit: Payer: Self-pay | Admitting: Family Medicine

## 2023-12-24 DIAGNOSIS — I10 Essential (primary) hypertension: Secondary | ICD-10-CM

## 2023-12-26 ENCOUNTER — Encounter: Payer: Self-pay | Admitting: Family Medicine

## 2023-12-31 ENCOUNTER — Ambulatory Visit: Payer: 59 | Admitting: Physical Therapy

## 2024-01-01 DIAGNOSIS — M791 Myalgia, unspecified site: Secondary | ICD-10-CM | POA: Diagnosis not present

## 2024-01-01 DIAGNOSIS — M4716 Other spondylosis with myelopathy, lumbar region: Secondary | ICD-10-CM | POA: Diagnosis not present

## 2024-01-01 DIAGNOSIS — G894 Chronic pain syndrome: Secondary | ICD-10-CM | POA: Diagnosis not present

## 2024-01-02 ENCOUNTER — Ambulatory Visit (INDEPENDENT_AMBULATORY_CARE_PROVIDER_SITE_OTHER): Admitting: Podiatry

## 2024-01-02 DIAGNOSIS — M722 Plantar fascial fibromatosis: Secondary | ICD-10-CM | POA: Diagnosis not present

## 2024-01-02 NOTE — Progress Notes (Signed)
 Subjective:  Patient ID: [name] [name], female    DOB: 06-19-1956,  MRN: [id number]  Chief Complaint  Patient presents with   Callouses    68 y.o. female presents with the above complaint.  Patient presents with bilateral heel pain that has been on for quite some time is progressive gotten worse worse with ambulation worse with pressure the heel pain is in the midfoot.  She states it hurts in the arches hurts with ambulation pain scale is 5 out of 10 dull aching nature she would like to discuss treatment options for she has not tried anything for it.   Review of Systems: Negative except as noted in the HPI. Denies N/V/F/Ch.  Past Medical History:  Diagnosis Date   ABDOMINAL PAIN, LOWER 02/16/2009   Anxiety    Aortic stenosis    APPENDECTOMY, HX OF 09/11/2007   ATTENTION DEFICIT DISORDER, ADULT 02/02/2009   BACK PAIN 07/16/2007   chronic   Back pain    spinal stimulator in place   CERVICAL RADICULOPATHY, RIGHT 07/16/2007   CHEST PAIN, ACUTE 09/03/2008   Chronic abdominal pain    Chronic pain syndrome 09/11/2007   Complication of anesthesia    woke up during surgeries few times and woke up during endoscopy and colonscopy few weeks ago, in terrible pain   DDD (degenerative disc disease), lumbosacral    DEPRESSION/ANXIETY 06/17/2010   Dysuria 02/20/2008   Elevated liver enzymes 1 and  1/2 months ago   Family history of anesthesia complication    mother woke up during surgeries   Fibromyalgia    Functional GI symptoms    FX, RAMUS NOS, CLOSED 07/16/2007   GERD (gastroesophageal reflux disease)    no current problems   H/O failed conscious sedation    PT STATES SHE IS HARD TO SEDATE!   HEMORRHOIDS 09/11/2007   HIATAL HERNIA, HX OF 09/11/2007   History of kidney stones    passed stones   HX, PERSONAL, MUSCULOSKELETAL DISORD NEC 07/16/2007   HX, PERSONAL, URINARY CALCULI 07/16/2007   Hyperlipidemia    on crestor   Irritable bowel syndrome 09/11/2007   Memory loss     NEOPLASM, SKIN, UNCERTAIN BEHAVIOR 11/23/2008   OSTEOARTHRITIS 09/11/2007   oa   OSTEOPENIA 10/23/2007   Pneumonia    PONV (postoperative nausea and vomiting)    ROTATOR CUFF REPAIR, HX OF 09/11/2007   S/P TAVR (transcatheter aortic valve replacement) 06/28/2021   s/p TAVR with 26mm Evolut Pro + via the TF approach by Dr. Clifton James and Dr. Laneta Simmers   Stroke [facility])    mini stroke "years ago" per patient   Suicidal ideations    SYMPTOM, PAIN, ABDOMINAL, RIGHT UP QUADRANT 07/16/2007   TAH/BSO, HX OF 09/11/2007   UTI 10/06/2010   VAGINITIS, ATROPHIC, POSTMENOPAUSAL 07/16/2007    Current Outpatient Medications:    acyclovir ointment (ZOVIRAX) 5 %, Apply 1 application topically every 4 (four) hours as needed. (Patient taking differently: Apply 1 application  topically every 4 (four) hours as needed (mouth sores).), Disp: 15 g, Rfl: 0   alendronate (FOSAMAX) 70 MG tablet, Take 1 tablet (70 mg total) by mouth every 7 (seven) days. Take with a full glass of water on an empty stomach., Disp: 4 tablet, Rfl: 11   ALPRAZolam (XANAX) 1 MG tablet, Take 1 tablet (1 mg total) by mouth at bedtime as needed for sleep., Disp: 30 tablet, Rfl: 0   amphetamine-dextroamphetamine (ADDERALL) 30 MG tablet, Take 30 mg by mouth daily., Disp: , Rfl:  ARIPiprazole (ABILIFY PO), Take 2 mg by mouth at bedtime., Disp: , Rfl:    cyanocobalamin (VITAMIN B12) 1000 MCG/ML injection, Inject 1 mL (1,000 mcg total) into the muscle every 30 (thirty) days., Disp: 2 mL, Rfl: 2   cyclobenzaprine (FLEXERIL) 10 MG tablet, Take 10 mg by mouth 3 (three) times daily as needed for muscle spasms., Disp: , Rfl:    diazepam (VALIUM) 5 MG tablet, Place 1 tablet vaginally nightly as needed for muscle spasm/ pelvic pain., Disp: 15 tablet, Rfl: 0   diclofenac Sodium (VOLTAREN) 1 % GEL, Apply 2 g topically 3 (three) times daily as needed (pain)., Disp: , Rfl:    dicyclomine (BENTYL) 10 MG capsule, TAKE 1 CAPSULE BY MOUTH EVERY 8 HOURS AS NEEDED FOR  SPASMS, Disp: 60 capsule, Rfl: 2   donepezil (ARICEPT) 5 MG tablet, TAKE 1 TABLET BY MOUTH EVERYDAY AT BEDTIME, Disp: 90 tablet, Rfl: 0   estradiol (ESTRACE) 0.1 MG/GM vaginal cream, Place 0.5 g vaginally 2 (two) times a week. Place 0.5g nightly for two weeks then twice a week after, Disp: 42.5 g, Rfl: 11   Evolocumab (REPATHA SURECLICK) 140 MG/ML SOAJ, Inject 140 mg into the skin every 14 (fourteen) days., Disp: 6 mL, Rfl: 3   gabapentin (NEURONTIN) 300 MG capsule, Take 300 mg by mouth as needed., Disp: , Rfl:    Heating Pad PADS, Use 3 times weekly, Disp: 12 each, Rfl: 0   hydrOXYzine (ATARAX) 25 MG tablet, Take 1 tablet (25 mg total) by mouth at bedtime., Disp: 30 tablet, Rfl: 11   losartan (COZAAR) 50 MG tablet, TAKE 1 TABLET BY MOUTH DAILY (PATIENT NEEDS TO MAKE AN APPOINTMENT), Disp: 30 tablet, Rfl: 2   meloxicam (MOBIC) 15 MG tablet, Take 15 mg by mouth daily., Disp: , Rfl:    ondansetron (ZOFRAN-ODT) 4 MG disintegrating tablet, DISSOLVE 1 TABLET BY MOUTH EVERY 8 HOURS AS NEEDED FOR NAUSEA, Disp: 30 tablet, Rfl: 0   Oxycodone HCl 10 MG TABS, Take 10 mg by mouth 4 (four) times daily as needed (pain)., Disp: , Rfl:    pentosan polysulfate (ELMIRON) 100 MG capsule, Take 1 capsule (100 mg total) by mouth 3 (three) times daily., Disp: 90 capsule, Rfl: 11   Vilazodone HCl (VIIBRYD) 40 MG TABS, TAKE (1) TABLET BY MOUTH ONCE DAILY WITH FULL MEAL., Disp: 90 tablet, Rfl: 1  Social History   Tobacco Use  Smoking Status Never  Smokeless Tobacco Never    Allergies  Allergen Reactions   Ciprofloxacin Itching and Palpitations    migraine   Macrobid [Nitrofurantoin Macrocrystal] Other (See Comments)    Body aches and Migraine headache   Ampicillin Other (See Comments)    Told by md not to take ampilciian due to penicillin allergy   Atorvastatin Other (See Comments)    Felt like had flu   Buspirone Hcl Other (See Comments)    REACTION: intolerance   Ezetimibe-Simvastatin Other (See Comments)     Unknown   Latex Other (See Comments)    Redness and burning   Lyrica [Pregabalin] Other (See Comments)    Caused swelling in legs and hands   Morphine And Codeine Itching    Not be a nice person   Penicillins Other (See Comments)    Fever and itching   Sulfa Antibiotics Nausea Only    Tolerate sometimes   Sulfasalazine Diarrhea   Objective:  There were no vitals filed for this visit. There is no height or weight on file to calculate BMI.  Constitutional Well developed. Well nourished.  Vascular Dorsalis pedis pulses palpable bilaterally. Posterior tibial pulses palpable bilaterally. Capillary refill normal to all digits.  No cyanosis or clubbing noted. Pedal hair growth normal.  Neurologic Normal speech. Oriented to person, place, and time. Epicritic sensation to light touch grossly present bilaterally.  Dermatologic Nails well groomed and normal in appearance. No open wounds. No skin lesions.  Orthopedic: Normal joint ROM without pain or crepitus bilaterally. No visible deformities. Tender to palpation at the midfoot bilaterally. No pain with calcaneal squeeze bilaterally. Ankle ROM diminished range of motion bilaterally. Silfverskiold Test: positive bilaterally.   Radiographs: None none  Assessment:   1. Plantar fasciitis of right foot   2. Plantar fasciitis of left foot    Plan:  Patient was evaluated and treated and all questions answered.  Plantar Fasciitis, midfoot bilaterally - XR reviewed as above.  - Educated on icing and stretching. Instructions given.  - Injection delivered to the plantar fascia as below. - DME: Plantar fascial brace dispensed to support the medial longitudinal arch of the foot and offload pressure from the heel and prevent arch collapse during weightbearing - Pharmacologic management: None  Procedure: Injection Tendon/Ligament Location: Bilateral plantar fascia at the glabrous junction; medial approach. Skin Prep:  alcohol Injectate: 0.5 cc 0.5% marcaine plain, 0.5 cc of 1% Lidocaine, 0.5 cc kenalog 10. Disposition: Patient tolerated procedure well. Injection site dressed with a band-aid.  No follow-ups on file.

## 2024-01-03 ENCOUNTER — Telehealth: Payer: Self-pay

## 2024-01-03 NOTE — Telephone Encounter (Signed)
 Called to get a drug interaction approved or denied by MD Eskridge advised rep that MD wont be back in office until 04/21

## 2024-01-09 ENCOUNTER — Ambulatory Visit: Payer: 59 | Admitting: Physical Therapy

## 2024-01-13 ENCOUNTER — Other Ambulatory Visit: Payer: Self-pay | Admitting: Urology

## 2024-01-13 DIAGNOSIS — K589 Irritable bowel syndrome without diarrhea: Secondary | ICD-10-CM

## 2024-01-13 DIAGNOSIS — N3289 Other specified disorders of bladder: Secondary | ICD-10-CM

## 2024-01-19 ENCOUNTER — Other Ambulatory Visit: Payer: Self-pay | Admitting: Family Medicine

## 2024-01-30 ENCOUNTER — Ambulatory Visit: Admitting: Podiatry

## 2024-01-31 DIAGNOSIS — G894 Chronic pain syndrome: Secondary | ICD-10-CM | POA: Diagnosis not present

## 2024-02-06 ENCOUNTER — Ambulatory Visit (INDEPENDENT_AMBULATORY_CARE_PROVIDER_SITE_OTHER): Admitting: Podiatry

## 2024-02-06 DIAGNOSIS — M62462 Contracture of muscle, left lower leg: Secondary | ICD-10-CM

## 2024-02-06 DIAGNOSIS — M62461 Contracture of muscle, right lower leg: Secondary | ICD-10-CM

## 2024-02-06 DIAGNOSIS — M722 Plantar fascial fibromatosis: Secondary | ICD-10-CM | POA: Diagnosis not present

## 2024-02-06 NOTE — Progress Notes (Signed)
 Subjective:  Patient ID: [name] [name], female    DOB: 1955-09-28,  MRN: [id number]  Chief Complaint  Patient presents with   Callouses    68 y.o. female presents with the above complaint.  Patient presents for follow-up of bilateral plantar fasciitis she states she is doing better.  She states the injection helped.  She still has some residual pain denies any other acute complaints.   Review of Systems: Negative except as noted in the HPI. Denies N/V/F/Ch.  Past Medical History:  Diagnosis Date   ABDOMINAL PAIN, LOWER 02/16/2009   Anxiety    Aortic stenosis    APPENDECTOMY, HX OF 09/11/2007   ATTENTION DEFICIT DISORDER, ADULT 02/02/2009   BACK PAIN 07/16/2007   chronic   Back pain    spinal stimulator in place   CERVICAL RADICULOPATHY, RIGHT 07/16/2007   CHEST PAIN, ACUTE 09/03/2008   Chronic abdominal pain    Chronic pain syndrome 09/11/2007   Complication of anesthesia    woke up during surgeries few times and woke up during endoscopy and colonscopy few weeks ago, in terrible pain   DDD (degenerative disc disease), lumbosacral    DEPRESSION/ANXIETY 06/17/2010   Dysuria 02/20/2008   Elevated liver enzymes 1 and  1/2 months ago   Family history of anesthesia complication    mother woke up during surgeries   Fibromyalgia    Functional GI symptoms    FX, RAMUS NOS, CLOSED 07/16/2007   GERD (gastroesophageal reflux disease)    no current problems   H/O failed conscious sedation    PT STATES SHE IS HARD TO SEDATE!   HEMORRHOIDS 09/11/2007   HIATAL HERNIA, HX OF 09/11/2007   History of kidney stones    passed stones   HX, PERSONAL, MUSCULOSKELETAL DISORD NEC 07/16/2007   HX, PERSONAL, URINARY CALCULI 07/16/2007   Hyperlipidemia    on crestor    Irritable bowel syndrome 09/11/2007   Memory loss    NEOPLASM, SKIN, UNCERTAIN BEHAVIOR 11/23/2008   OSTEOARTHRITIS 09/11/2007   oa   OSTEOPENIA 10/23/2007   Pneumonia    PONV (postoperative nausea and vomiting)     ROTATOR CUFF REPAIR, HX OF 09/11/2007   S/P TAVR (transcatheter aortic valve replacement) 06/28/2021   s/p TAVR with 26mm Evolut Pro + via the TF approach by Dr. Abel Hoe and Dr. Sherene Dilling   Stroke [facility])    mini stroke "years ago" per patient   Suicidal ideations    SYMPTOM, PAIN, ABDOMINAL, RIGHT UP QUADRANT 07/16/2007   TAH/BSO, HX OF 09/11/2007   UTI 10/06/2010   VAGINITIS, ATROPHIC, POSTMENOPAUSAL 07/16/2007    Current Outpatient Medications:    acyclovir  ointment (ZOVIRAX ) 5 %, Apply 1 application topically every 4 (four) hours as needed. (Patient taking differently: Apply 1 application  topically every 4 (four) hours as needed (mouth sores).), Disp: 15 g, Rfl: 0   alendronate  (FOSAMAX ) 70 MG tablet, Take 1 tablet (70 mg total) by mouth every 7 (seven) days. Take with a full glass of water on an empty stomach., Disp: 4 tablet, Rfl: 11   ALPRAZolam  (XANAX ) 1 MG tablet, Take 1 tablet (1 mg total) by mouth at bedtime as needed for sleep., Disp: 30 tablet, Rfl: 0   amphetamine -dextroamphetamine  (ADDERALL) 30 MG tablet, Take 30 mg by mouth daily., Disp: , Rfl:    ARIPiprazole (ABILIFY PO), Take 2 mg by mouth at bedtime., Disp: , Rfl:    cyanocobalamin  (VITAMIN B12) 1000 MCG/ML injection, Inject 1 mL (1,000 mcg total) into the muscle every 30 (  thirty) days., Disp: 2 mL, Rfl: 2   cyclobenzaprine  (FLEXERIL ) 10 MG tablet, Take 10 mg by mouth 3 (three) times daily as needed for muscle spasms., Disp: , Rfl:    diazepam  (VALIUM ) 5 MG tablet, Place 1 tablet vaginally nightly as needed for muscle spasm/ pelvic pain., Disp: 15 tablet, Rfl: 0   diclofenac Sodium (VOLTAREN) 1 % GEL, Apply 2 g topically 3 (three) times daily as needed (pain)., Disp: , Rfl:    dicyclomine  (BENTYL ) 10 MG capsule, TAKE 1 CAPSULE BY MOUTH EVERY 8 HOURS AS NEEDED FOR SPASMS, Disp: 60 capsule, Rfl: 2   donepezil  (ARICEPT ) 5 MG tablet, TAKE 1 TABLET BY MOUTH EVERYDAY AT BEDTIME, Disp: 90 tablet, Rfl: 0   estradiol  (ESTRACE ) 0.1  MG/GM vaginal cream, Place 0.5 g vaginally 2 (two) times a week. Place 0.5g nightly for two weeks then twice a week after, Disp: 42.5 g, Rfl: 11   Evolocumab  (REPATHA  SURECLICK) 140 MG/ML SOAJ, Inject 140 mg into the skin every 14 (fourteen) days., Disp: 6 mL, Rfl: 3   gabapentin  (NEURONTIN ) 300 MG capsule, Take 300 mg by mouth as needed., Disp: , Rfl:    Heating Pad PADS, Use 3 times weekly, Disp: 12 each, Rfl: 0   hydrOXYzine  (ATARAX ) 25 MG tablet, Take 1 tablet (25 mg total) by mouth at bedtime., Disp: 30 tablet, Rfl: 11   losartan  (COZAAR ) 50 MG tablet, TAKE 1 TABLET BY MOUTH DAILY (PATIENT NEEDS TO MAKE AN APPOINTMENT), Disp: 30 tablet, Rfl: 2   meloxicam (MOBIC) 15 MG tablet, Take 15 mg by mouth daily., Disp: , Rfl:    ondansetron  (ZOFRAN -ODT) 4 MG disintegrating tablet, DISSOLVE 1 TABLET BY MOUTH EVERY 8 HOURS AS NEEDED FOR NAUSEA, Disp: 30 tablet, Rfl: 0   Oxycodone  HCl 10 MG TABS, Take 10 mg by mouth 4 (four) times daily as needed (pain)., Disp: , Rfl:    pentosan polysulfate (ELMIRON ) 100 MG capsule, Take 1 capsule (100 mg total) by mouth 3 (three) times daily., Disp: 90 capsule, Rfl: 11   Vilazodone HCl (VIIBRYD) 40 MG TABS, TAKE (1) TABLET BY MOUTH ONCE DAILY WITH FULL MEAL., Disp: 90 tablet, Rfl: 1  Social History   Tobacco Use  Smoking Status Never  Smokeless Tobacco Never    Allergies  Allergen Reactions   Ciprofloxacin  Itching and Palpitations    migraine   Macrobid  [Nitrofurantoin  Macrocrystal] Other (See Comments)    Body aches and Migraine headache   Ampicillin Other (See Comments)    Told by md not to take ampilciian due to penicillin allergy   Atorvastatin Other (See Comments)    Felt like had flu   Buspirone Hcl Other (See Comments)    REACTION: intolerance   Ezetimibe-Simvastatin Other (See Comments)    Unknown   Latex Other (See Comments)    Redness and burning   Lyrica [Pregabalin] Other (See Comments)    Caused swelling in legs and hands   Morphine  And  Codeine  Itching    Not be a nice person   Penicillins Other (See Comments)    Fever and itching   Sulfa  Antibiotics Nausea Only    Tolerate sometimes   Sulfasalazine Diarrhea   Objective:  There were no vitals filed for this visit. There is no height or weight on file to calculate BMI. Constitutional Well developed. Well nourished.  Vascular Dorsalis pedis pulses palpable bilaterally. Posterior tibial pulses palpable bilaterally. Capillary refill normal to all digits.  No cyanosis or clubbing noted. Pedal hair growth normal.  Neurologic Normal speech. Oriented to person, place, and time. Epicritic sensation to light touch grossly present bilaterally.  Dermatologic Nails well groomed and normal in appearance. No open wounds. No skin lesions.  Orthopedic: Normal joint ROM without pain or crepitus bilaterally. No visible deformities. Tender to palpation at the midfoot bilaterally. No pain with calcaneal squeeze bilaterally. Ankle ROM diminished range of motion bilaterally. Silfverskiold Test: positive bilaterally.   Radiographs: None none  Assessment:   1. Plantar fasciitis of right foot   2. Plantar fasciitis of left foot   3. Gastrocnemius equinus, bilateral     Plan:  Patient was evaluated and treated and all questions answered.  Plantar Fasciitis, midfoot bilaterally with underlying gastrocnemius equinus - XR reviewed as above.  - Educated on icing and stretching. Instructions given.  - Second injection delivered to the plantar fascia as below. - DME: Plantar fascial brace dispensed to support the medial longitudinal arch of the foot and offload pressure from the heel and prevent arch collapse during weightbearing - Pharmacologic management: None  Procedure: Injection Tendon/Ligament Location: Bilateral plantar fascia at the glabrous junction; medial approach. Skin Prep: alcohol Injectate: 0.5 cc 0.5% marcaine  plain, 0.5 cc of 1% Lidocaine , 0.5 cc kenalog  10. Disposition: Patient tolerated procedure well. Injection site dressed with a band-aid.  No follow-ups on file.

## 2024-02-14 ENCOUNTER — Encounter: Payer: Self-pay | Admitting: Family Medicine

## 2024-02-14 ENCOUNTER — Ambulatory Visit (INDEPENDENT_AMBULATORY_CARE_PROVIDER_SITE_OTHER): Admitting: Family Medicine

## 2024-02-14 VITALS — BP 130/86 | HR 79 | Temp 98.1°F | Ht 63.5 in | Wt 166.0 lb

## 2024-02-14 DIAGNOSIS — Z77120 Contact with and (suspected) exposure to mold (toxic): Secondary | ICD-10-CM | POA: Diagnosis not present

## 2024-02-14 DIAGNOSIS — E538 Deficiency of other specified B group vitamins: Secondary | ICD-10-CM

## 2024-02-14 DIAGNOSIS — B89 Unspecified parasitic disease: Secondary | ICD-10-CM

## 2024-02-14 DIAGNOSIS — E785 Hyperlipidemia, unspecified: Secondary | ICD-10-CM

## 2024-02-14 DIAGNOSIS — Z1211 Encounter for screening for malignant neoplasm of colon: Secondary | ICD-10-CM

## 2024-02-14 DIAGNOSIS — I10 Essential (primary) hypertension: Secondary | ICD-10-CM

## 2024-02-14 DIAGNOSIS — Z6828 Body mass index (BMI) 28.0-28.9, adult: Secondary | ICD-10-CM

## 2024-02-14 DIAGNOSIS — R252 Cramp and spasm: Secondary | ICD-10-CM

## 2024-02-14 DIAGNOSIS — F341 Dysthymic disorder: Secondary | ICD-10-CM | POA: Diagnosis not present

## 2024-02-14 DIAGNOSIS — E663 Overweight: Secondary | ICD-10-CM | POA: Diagnosis not present

## 2024-02-14 DIAGNOSIS — R413 Other amnesia: Secondary | ICD-10-CM

## 2024-02-14 DIAGNOSIS — E559 Vitamin D deficiency, unspecified: Secondary | ICD-10-CM | POA: Diagnosis not present

## 2024-02-14 LAB — COMPREHENSIVE METABOLIC PANEL WITH GFR
ALT: 12 U/L (ref 0–35)
AST: 13 U/L (ref 0–37)
Albumin: 4.2 g/dL (ref 3.5–5.2)
Alkaline Phosphatase: 79 U/L (ref 39–117)
BUN: 18 mg/dL (ref 6–23)
CO2: 31 meq/L (ref 19–32)
Calcium: 8.8 mg/dL (ref 8.4–10.5)
Chloride: 104 meq/L (ref 96–112)
Creatinine, Ser: 1.12 mg/dL (ref 0.40–1.20)
GFR: 50.68 mL/min — ABNORMAL LOW (ref >60.00)
Glucose, Bld: 99 mg/dL (ref 70–99)
Potassium: 4.2 meq/L (ref 3.5–5.1)
Sodium: 139 meq/L (ref 135–145)
Total Bilirubin: 0.7 mg/dL (ref 0.2–1.2)
Total Protein: 6.8 g/dL (ref 6.0–8.3)

## 2024-02-14 LAB — CBC WITH DIFFERENTIAL/PLATELET
Basophils Absolute: 0.1 10*3/uL (ref 0.0–0.1)
Basophils Relative: 0.6 % (ref 0.0–3.0)
Eosinophils Absolute: 0.1 10*3/uL (ref 0.0–0.7)
Eosinophils Relative: 1.2 % (ref 0.0–5.0)
HCT: 43.9 % (ref 36.0–46.0)
Hemoglobin: 14.7 g/dL (ref 12.0–15.0)
Lymphocytes Relative: 28.4 % (ref 12.0–46.0)
Lymphs Abs: 2.4 10*3/uL (ref 0.7–4.0)
MCHC: 33.5 g/dL (ref 30.0–36.0)
MCV: 95.4 fl (ref 78.0–100.0)
Monocytes Absolute: 0.6 10*3/uL (ref 0.1–1.0)
Monocytes Relative: 6.7 % (ref 3.0–12.0)
Neutro Abs: 5.4 10*3/uL (ref 1.4–7.7)
Neutrophils Relative %: 63.1 % (ref 43.0–77.0)
Platelets: 323 10*3/uL (ref 150.0–400.0)
RBC: 4.59 Mil/uL (ref 3.87–5.11)
RDW: 13.7 % (ref 11.5–15.5)
WBC: 8.6 10*3/uL (ref 4.0–10.5)

## 2024-02-14 LAB — LIPID PANEL
Cholesterol: 230 mg/dL — ABNORMAL HIGH (ref 0–200)
HDL: 72.5 mg/dL (ref >39.00)
LDL Cholesterol: 144 mg/dL — ABNORMAL HIGH (ref 0–99)
NonHDL: 157.46
Total CHOL/HDL Ratio: 3
Triglycerides: 66 mg/dL (ref 0.0–149.0)
VLDL: 13.2 mg/dL (ref 0.0–40.0)

## 2024-02-14 LAB — VITAMIN D 25 HYDROXY (VIT D DEFICIENCY, FRACTURES): VITD: 32.54 ng/mL (ref 30.00–100.00)

## 2024-02-14 LAB — TSH: TSH: 2.55 u[IU]/mL (ref 0.35–5.50)

## 2024-02-14 LAB — HEMOGLOBIN A1C: Hgb A1c MFr Bld: 5.6 % (ref 4.6–6.5)

## 2024-02-14 LAB — MAGNESIUM: Magnesium: 2.4 mg/dL (ref 1.5–2.5)

## 2024-02-14 LAB — VITAMIN B12: Vitamin B-12: 717 pg/mL (ref 211–911)

## 2024-02-14 MED ORDER — DONEPEZIL HCL 5 MG PO TABS: 5.0000 mg | ORAL_TABLET | Freq: Every day | ORAL | 0 refills | Status: AC

## 2024-02-14 NOTE — Patient Instructions (Addendum)
-  Physical exam completed today. No concerns. -Ordered labs. Office will call with lab results and you will see them on MyChart. -Ordered cologuard.  -Placed a referral to pyschology-counseling and neurology for memory loss.  -Continue all prescribed medications. Refilled Donepezil .  -Follow up in within one month to discuss other concerns.
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
## 2024-02-15 ENCOUNTER — Ambulatory Visit: Payer: Self-pay | Admitting: Family Medicine

## 2024-02-15 LAB — ALLERGY PANEL 11, MOLD GROUP
Allergen, A. alternata, m6: <0.1 kU/L
Allergen, Mucor Racemosus, M4: <0.1 kU/L
Aspergillus fumigatus, m3: <0.1 kU/L
CLADOSPORIUM HERBARUM (M2) IGE: <0.1 kU/L
CLASS: 0
CLASS: 0
Candida Albicans: <0.1 kU/L
Class: 0
Class: 0
Class: 0

## 2024-02-15 LAB — INTERPRETATION:

## 2024-02-16 LAB — PARASITE EXAM, BLOOD

## 2024-03-04 DIAGNOSIS — G894 Chronic pain syndrome: Secondary | ICD-10-CM | POA: Diagnosis not present

## 2024-03-04 DIAGNOSIS — M5441 Lumbago with sciatica, right side: Secondary | ICD-10-CM | POA: Diagnosis not present

## 2024-03-04 DIAGNOSIS — M5442 Lumbago with sciatica, left side: Secondary | ICD-10-CM | POA: Diagnosis not present

## 2024-03-10 DIAGNOSIS — Z1211 Encounter for screening for malignant neoplasm of colon: Secondary | ICD-10-CM | POA: Diagnosis not present

## 2024-03-17 ENCOUNTER — Ambulatory Visit: Admitting: Family Medicine

## 2024-03-18 ENCOUNTER — Encounter (HOSPITAL_BASED_OUTPATIENT_CLINIC_OR_DEPARTMENT_OTHER): Payer: Self-pay

## 2024-03-18 LAB — COLOGUARD: COLOGUARD: NEGATIVE

## 2024-03-19 ENCOUNTER — Ambulatory Visit (HOSPITAL_BASED_OUTPATIENT_CLINIC_OR_DEPARTMENT_OTHER): Admitting: Nurse Practitioner

## 2024-03-24 ENCOUNTER — Other Ambulatory Visit

## 2024-03-24 ENCOUNTER — Encounter (HOSPITAL_BASED_OUTPATIENT_CLINIC_OR_DEPARTMENT_OTHER): Payer: Self-pay

## 2024-03-24 ENCOUNTER — Telehealth: Payer: Self-pay

## 2024-03-24 NOTE — Telephone Encounter (Signed)
 Copied from CRM [id number]. Topic: Clinical - Lab/Test Results >> Mar 24, 2024 11:52 AM Lavanda D wrote: Reason for CRM: Patient is requesting turn-around time for labs, [facility] and says they need her labs.

## 2024-03-24 NOTE — Progress Notes (Unsigned)
 Cardiology Office Note   Date:  03/25/2024  ID:  [name] [name] 1956-05-22, MRN [id number] PCP: Billy Philippe SAUNDERS, NP  [facility] Providers Cardiologist:  None     PMH Dyslipidemia Family history early CAD Mother died age 67 from heart disease Murmur Bicuspid aortic valve Severe aortic stenosis S/p TAVR October 2022 with (26 mm Medtronic Evolut Pro THV) No evidence of coronary artery disease on cath 01/2021 Chronic back pain on opioid therapy managed by pain management   Referred to cardiology and seen by Dr. Mona for management of dyslipidemia.  Lipids at that time were total cholesterol 274, HDL 45, LDL 183, and triglycerides 211.  Hemoglobin A1c was 5.1.  She had tried atorvastatin which caused flulike symptoms and Vytorin which caused unknown side effects.  She was not on any lipid-lowering therapy at the time.  Family history of heart disease in her mother who died at age 67 and her sisters.  She has a younger brother who has what sounds like an enlarged heart.  She was told at age 23 that she has a heart murmur which kept her from getting a job she wanted.  She was told very little about this.  Echo reviewed by Dr. Mona revealed what appeared to be significant calcification of the aortic valve and possible bicuspid valve with 2 fused raphe.  Moderate aortic stenosis with mean gradient 22 mmHg and mild to moderate aortic insufficiency.  She did not recall these findings.  She was referred to Structural heart team and underwent TAVR October 2022 for severe aortic stenosis.  Cardiac cath prior to TAVR revealed no significant coronary artery disease.  She had no postoperative complications.  Echo 06/26/2022 revealed LVEF > 75%, grade 1 diastolic dysfunction, normal RV, mild MR, peak gradient 26.8 mmHg (increased from 10 mmHg on echo 06/2021) in the setting of hyperdynamic LV function, no AI or AS. She was advised to have repeat echo in 1 year.   Last clinic visit was  06/29/2022 with Dr. Mona.  She was asymptomatic.  She was transitioned to aspirin  81 mg daily April 2023 but was not on it at time of visit.  She was advised to resume aspirin .  There was concern for elevated LP(a), but this was not elevated.  She was advised to start Repatha  140 mg every 14 days.  Most recent lipid panel 02/14/2024 with total cholesterol 230, triglycerides 66, HDL 72.5, and LDL 144.   History of Present Illness Discussed the use of AI scribe software for clinical note transcription with the patient, who gave verbal consent to proceed. History of Present Illness [name] [name] is a pleasant 68 year old female who presents for follow-up of hyperlipidemia. She experiences shortness of breath, especially in the heat, and sometimes feels anxious, which affects her breathing. She occasionally has chest pain, which she attributes to heartburn or gas, and sometimes experiences palpitations, particularly when she has headaches. She does not regularly monitor blood pressure at home. She discontinued Repatha  injections due to concerns about memory problems and is scheduled to discuss this with a neurologist. She is unsure of the number of injections completed. She is trying to incorporate more physical activity into her routine to lose weight and alleviate foot pain, and she is concerned about her weight affecting her breathing. She is limited by back pain which is severe at times and requires use of opioids for  pain management. She maintains a garden and engages in outdoor activities but experiences  shortness of breath and fatigue at times during exertion. She does not walk long distances but tries to stay active.   ROS: See HPI  Studies Reviewed EKG Interpretation Date/Time:  Tuesday March 25 2024 08:27:17 EDT Ventricular Rate:  89 PR Interval:  148 QRS Duration:  68 QT Interval:  352 QTC Calculation: 428 R Axis:   59  Text Interpretation: Normal sinus rhythm Septal infarct , age  undetermined When compared with ECG of 29-Jun-2021 05:28, T wave amplitude has increased in Inferior leads No acute concerns Confirmed by Percy Browning [phone]) on 03/25/2024 12:19:26 PM     Lipoprotein (a)  Date/Time Value Ref Range Status  11/03/2020 03:46 PM <8.4 <75.0 nmol/L Final    Comment:    **Results verified by repeat testing** Note:  Values greater than or equal to 75.0 nmol/L may        indicate an independent risk factor for CHD,        but must be evaluated with caution when applied        to non-Caucasian populations due to the        influence of genetic factors on Lp(a) across        ethnicities.     Risk Assessment/Calculations           Physical Exam VS:  BP 130/74 (BP Location: Left Arm, Patient Position: Sitting, Cuff Size: Normal)   Pulse 89   Ht 5' 3 (1.6 m)   Wt 166 lb (75.3 kg)   SpO2 98%   BMI 29.41 kg/m    Wt Readings from Last 3 Encounters:  03/25/24 166 lb (75.3 kg)  02/14/24 166 lb (75.3 kg)  12/20/23 164 lb (74.4 kg)    GEN: Well nourished, well developed in no acute distress NECK: No JVD; No carotid bruits CARDIAC: RRR, no murmurs, rubs, gallops RESPIRATORY:  Clear to auscultation without rales, wheezing or rhonchi  ABDOMEN: Soft, non-tender, non-distended EXTREMITIES:  No edema; No deformity   Assessment & Plan Hyperlipidemia LDL goal < 70 Lipid panel completed 02/14/2024 with total cholesterol 230, triglycerides 66, HDL 72, LDL 144.  She completed some injections on Repatha , unsure of how many, and stopped the medication due to concerns with memory loss. Discussed the benefits of Repatha  for cholesterol management and dementia prevention. We discussed dietary recommendations for heart healthy diet and to lower cholesterol including reducing saturated fats, processed foods, sugar, and simple carbohydrates and eating a plant forward diet with lots of  vegetables, fruits, and whole grains.  She is agreeable to restart Repatha  140 mg every 14  days.  I have asked her to notify me if she has concerning side effects or if discussion with neurologist leads her to discontinue it. Will plan to recheck cholesterol levels in three months.  CAD Chest pain/DOE Left heart cath 01/2021 with no angiographic evidence of CAD.  She reports occasional chest pains and shortness of breath not worsened with exertion. She is active but does not exercise on a regular basis. Symptoms atypical for angina, no indication for further ischemia evaluation at this time. Focus on heart healthy and aiming for at least 150 minutes of moderate intensity exercise each week.   Hypertension BP is well controlled. Renal function stable on labs completed by PCP 02/14/24. Management per PCP. No change in anti-hypertensive therapy today.   Severe AS s/p TAVR She underwent TAVR 06/2021.  Echo 06/26/2022 with normal LVEF, increased TAVR gradients felt to be secondary to hyperdynamic LV function.  Recommendation to repeat echo in 1 year.  We will get updated echo to assess valve function. She has occasional shortness of breath but no significant concerns.   Chronic pain  Chronic back pain is managed with oxycodone  and gabapentin . Management per spine center. She is concerned about memory loss and has gotten lost recently when driving to familiar locations. She takes opioids on a regular basis due to pain and does not feel she can reduce her intake.    Memory loss   She is concerned about worsening memory loss and feels it may be exacerbated by medications.  Advised that poorly controlled cholesterol can contribute to dementia.  She is willing to restart Repatha  as noted above.  Awaiting neurology evaluation for further assessment.        Dispo: 6 months with me  Signed, Rosaline Bane, NP-C

## 2024-03-24 NOTE — Telephone Encounter (Signed)
 Attempted to call pt back mail box was full.

## 2024-03-25 ENCOUNTER — Encounter (HOSPITAL_BASED_OUTPATIENT_CLINIC_OR_DEPARTMENT_OTHER): Payer: Self-pay | Admitting: Nurse Practitioner

## 2024-03-25 ENCOUNTER — Ambulatory Visit (INDEPENDENT_AMBULATORY_CARE_PROVIDER_SITE_OTHER): Admitting: Nurse Practitioner

## 2024-03-25 VITALS — BP 130/74 | HR 89 | Ht 63.0 in | Wt 166.0 lb

## 2024-03-25 DIAGNOSIS — I350 Nonrheumatic aortic (valve) stenosis: Secondary | ICD-10-CM | POA: Diagnosis not present

## 2024-03-25 DIAGNOSIS — I10 Essential (primary) hypertension: Secondary | ICD-10-CM | POA: Diagnosis not present

## 2024-03-25 DIAGNOSIS — Z79899 Other long term (current) drug therapy: Secondary | ICD-10-CM

## 2024-03-25 DIAGNOSIS — G894 Chronic pain syndrome: Secondary | ICD-10-CM

## 2024-03-25 DIAGNOSIS — R413 Other amnesia: Secondary | ICD-10-CM

## 2024-03-25 DIAGNOSIS — Z952 Presence of prosthetic heart valve: Secondary | ICD-10-CM

## 2024-03-25 DIAGNOSIS — E785 Hyperlipidemia, unspecified: Secondary | ICD-10-CM | POA: Diagnosis not present

## 2024-03-25 MED ORDER — REPATHA SURECLICK 140 MG/ML ~~LOC~~ SOAJ: 140.0000 mg | SUBCUTANEOUS | 3 refills | Status: AC

## 2024-03-25 NOTE — Patient Instructions (Signed)
 Medication Instructions:   RESTART Repatha  one injection subcutaneous ( 140 mg) every 14 days.   *If you need a refill on your cardiac medications before your next appointment, please call your pharmacy*  Lab Work:  Your physician recommends that you return for a FASTING NMR, fasting after midnight the day of your echo.  Patient given paperwork today.    If you have labs (blood work) drawn today and your tests are completely normal, you will receive your results only by: MyChart Message (if you have MyChart) OR A paper copy in the mail If you have any lab test that is abnormal or we need to change your treatment, we will call you to review the results.  Testing/Procedures:  Your physician has requested that you have an echocardiogram. Echocardiography is a painless test that uses sound waves to create images of your heart. It provides your doctor with information about the size and shape of your heart and how well your heart's chambers and valves are working. This procedure takes approximately one hour. There are no restrictions for this procedure. Please do NOT wear cologne, perfume or lotions (deodorant is allowed). Please arrive 15 minutes prior to your appointment time.   Follow-Up: At [facility], you and your health needs are our priority.  As part of our continuing mission to provide you with exceptional heart care, our providers are all part of one team.  This team includes your primary Cardiologist (physician) and Advanced Practice Providers or APPs (Physician Assistants and Nurse Practitioners) who all work together to provide you with the care you need, when you need it.  Your next appointment:   6 month(s)  Provider:   Rosaline Bane, NP    We recommend signing up for the patient portal called MyChart.  Sign up information is provided on this After Visit Summary.  MyChart is used to connect with patients for Virtual Visits (Telemedicine).  Patients are able  to view lab/test results, encounter notes, upcoming appointments, etc.  Non-urgent messages can be sent to your provider as well.   To learn more about what you can do with MyChart, go to [email or web address].   Other Instructions  Adopting a Healthy Lifestyle.   Weight: Know what a healthy weight is for you (roughly BMI <25) and aim to maintain this. You can calculate your body mass index on your smart phone. Unfortunately, this is not the most accurate measure of healthy weight, but it is the simplest measurement to use. A more accurate measurement involves body scanning which measures lean muscle, fat tissue and bony density. We do not have this equipment at [facility].    Diet: Aim for 7+ servings of fruits and vegetables daily Limit animal fats in diet for cholesterol and heart health - choose grass fed whenever available Avoid highly processed foods (fast food burgers, tacos, fried chicken, pizza, hot dogs, french fries)  Saturated fat comes in the form of butter, lard, coconut oil, margarine, partially hydrogenated oils, and fat in meat. These increase your risk of cardiovascular disease.  Use healthy plant oils, such as olive, canola, soy, corn, sunflower and peanut.  Whole foods such as fruits, vegetables and whole grains have fiber  Men need > 38 grams of fiber per day Women need > 25 grams of fiber per day  Load up on vegetables and fruits - one-half of your plate: Aim for color and variety, and remember that potatoes dont count. Go for whole grains - one-quarter of your plate: Whole  wheat, barley, wheat berries, quinoa, oats, brown rice, and foods made with them. If you want pasta, go with whole wheat pasta. Protein power - one-quarter of your plate: Fish, chicken, beans, and nuts are all healthy, versatile protein sources. Limit red meat. You need carbohydrates for energy! The type of carbohydrate is more important than the amount. Choose carbohydrates such as vegetables, fruits,  whole grains, beans, and nuts in the place of white rice, white pasta, potatoes (baked or fried), macaroni and cheese, cakes, cookies, and donuts.  If youre thirsty, drink water. Coffee and tea are good in moderation, but skip sugary drinks and limit milk and dairy products to one or two daily servings. Keep sugar intake at 6 teaspoons or 24 grams or LESS       Exercise: Aim for 150 min of moderate intensity exercise weekly for heart health, and weights twice weekly for bone health Stay active - any steps are better than no steps! Aim for 7-9 hours of sleep daily          Mediterranean Diet  Why follow it? Research shows. Those who follow the Mediterranean diet have a reduced risk of heart disease  The diet is associated with a reduced incidence of Parkinson's and Alzheimer's diseases People following the diet may have longer life expectancies and lower rates of chronic diseases  The Dietary Guidelines for Americans recommends the Mediterranean diet as an eating plan to promote health and prevent disease  What Is the Mediterranean Diet?  Healthy eating plan based on typical foods and recipes of Mediterranean-style cooking The diet is primarily a plant based diet; these foods should make up a majority of meals   Starches - Plant based foods should make up a majority of meals - They are an important sources of vitamins, minerals, energy, antioxidants, and fiber - Choose whole grains, foods high in fiber and minimally processed items  - Typical grain sources include wheat, oats, barley, corn, brown rice, bulgar, farro, millet, polenta, couscous  - Various types of beans include chickpeas, lentils, fava beans, black beans, white beans   Fruits  Veggies - Large quantities of antioxidant rich fruits & veggies; 6 or more servings  - Vegetables can be eaten raw or lightly drizzled with oil and cooked  - Vegetables common to the traditional Mediterranean Diet include: artichokes, arugula,  beets, broccoli, brussel sprouts, cabbage, carrots, celery, collard greens, cucumbers, eggplant, kale, leeks, lemons, lettuce, mushrooms, okra, onions, peas, peppers, potatoes, pumpkin, radishes, rutabaga, shallots, spinach, sweet potatoes, turnips, zucchini - Fruits common to the Mediterranean Diet include: apples, apricots, avocados, cherries, clementines, dates, figs, grapefruits, grapes, melons, nectarines, oranges, peaches, pears, pomegranates, strawberries, tangerines  Fats - Replace butter and margarine with healthy oils, such as olive oil, canola oil, and tahini  - Limit nuts to no more than a handful a day  - Nuts include walnuts, almonds, pecans, pistachios, pine nuts  - Limit or avoid candied, honey roasted or heavily salted nuts - Olives are central to the [employer] - can be eaten whole or used in a variety of dishes   Meats Protein - Limiting red meat: no more than a few times a month - When eating red meat: choose lean cuts and keep the portion to the size of deck of cards - Eggs: approx. 0 to 4 times a week  - Fish and lean poultry: at least 2 a week  - Healthy protein sources include, chicken, [place], lean beef, lamb - Increase intake of  seafood such as tuna, salmon, trout, mackerel, shrimp, scallops - Avoid or limit high fat processed meats such as sausage and bacon  Dairy - Include moderate amounts of low fat dairy products  - Focus on healthy dairy such as fat free yogurt, skim milk, low or reduced fat cheese - Limit dairy products higher in fat such as whole or 2% milk, cheese, ice cream  Alcohol - Moderate amounts of red wine is ok  - No more than 5 oz daily for women (all ages) and men older than age 66  - No more than 10 oz of wine daily for men younger than 48  Other - Limit sweets and other desserts  - Use herbs and spices instead of salt to flavor foods  - Herbs and spices common to the traditional Mediterranean Diet include: basil, bay leaves, chives, cloves,  cumin, fennel, garlic, lavender, marjoram, mint, oregano, parsley, pepper, rosemary, sage, savory, sumac, tarragon, thyme   It's not just a diet, it's a lifestyle:  The Mediterranean diet includes lifestyle factors typical of those in the region  Foods, drinks and meals are best eaten with others and savored Daily physical activity is important for overall good health This could be strenuous exercise like running and aerobics This could also be more leisurely activities such as walking, housework, yard-work, or taking the stairs Moderation is the key; a balanced and healthy diet accommodates most foods and drinks Consider portion sizes and frequency of consumption of certain foods   Meal Ideas & Options:  Breakfast:  Whole wheat toast or whole wheat English muffins with peanut butter & hard boiled egg Steel cut oats topped with apples & cinnamon and skim milk  Fresh fruit: banana, strawberries, melon, berries, peaches  Smoothies: strawberries, bananas, greek yogurt, peanut butter Low fat greek yogurt with blueberries and granola  Egg white omelet with spinach and mushrooms Breakfast couscous: whole wheat couscous, apricots, skim milk, cranberries  Sandwiches:  Hummus and grilled vegetables (peppers, zucchini, squash) on whole wheat bread   Grilled chicken on whole wheat pita with lettuce, tomatoes, cucumbers or tzatziki  [place] salad on whole wheat bread: tuna salad made with greek yogurt, olives, red peppers, capers, green onions Garlic rosemary lamb pita: lamb sauted with garlic, rosemary, salt & pepper; add lettuce, cucumber, greek yogurt to pita - flavor with lemon juice and black pepper  Seafood:  Mediterranean grilled salmon, seasoned with garlic, basil, parsley, lemon juice and black pepper Shrimp, lemon, and spinach whole-grain pasta salad made with low fat greek yogurt  Seared scallops with lemon orzo  Seared tuna steaks seasoned salt, pepper, coriander topped with tomato mixture  of olives, tomatoes, olive oil, minced garlic, parsley, green onions and cappers  Meats:  Herbed greek chicken salad with kalamata olives, cucumber, feta  Red bell peppers stuffed with spinach, bulgur, lean ground beef (or lentils) & topped with feta   Kebabs: skewers of chicken, tomatoes, onions, zucchini, squash  [place] burgers: made with red onions, mint, dill, lemon juice, feta cheese topped with roasted red peppers Vegetarian Cucumber salad: cucumbers, artichoke hearts, celery, red onion, feta cheese, tossed in olive oil & lemon juice  Hummus and whole grain pita points with a greek salad (lettuce, tomato, feta, olives, cucumbers, red onion) Lentil soup with celery, carrots made with vegetable broth, garlic, salt and pepper  Tabouli salad: parsley, bulgur, mint, scallions, cucumbers, tomato, radishes, lemon juice, olive oil, salt and pepper.

## 2024-03-26 ENCOUNTER — Telehealth: Payer: Self-pay | Admitting: Pharmacy Technician

## 2024-03-26 NOTE — Telephone Encounter (Signed)
 Pharmacy Patient Advocate Encounter   Received notification from CoverMyMeds that prior authorization for repatha  140mg  is required/requested.   Insurance verification completed.   The patient is insured through [facility] .   Per test claim: PA required; PA submitted to above mentioned insurance via CoverMyMeds Key/confirmation #/EOC [id number] Status is pending   Questions were like she had been on repatha  but she is starting now. Had one fill in 02/2023

## 2024-03-26 NOTE — Telephone Encounter (Signed)
 Pharmacy Patient Advocate Encounter  Received notification from [employer] that Prior Authorization for repatha  140mg  has been APPROVED from 03/26/24 to 09/24/24   PA #/Case ID/Reference #:  [id number]   Sent mychart

## 2024-03-27 ENCOUNTER — Other Ambulatory Visit: Payer: Self-pay | Admitting: Family Medicine

## 2024-03-27 DIAGNOSIS — I10 Essential (primary) hypertension: Secondary | ICD-10-CM

## 2024-04-01 DIAGNOSIS — M5441 Lumbago with sciatica, right side: Secondary | ICD-10-CM | POA: Diagnosis not present

## 2024-04-01 DIAGNOSIS — M5442 Lumbago with sciatica, left side: Secondary | ICD-10-CM | POA: Diagnosis not present

## 2024-04-01 DIAGNOSIS — Z9689 Presence of other specified functional implants: Secondary | ICD-10-CM | POA: Diagnosis not present

## 2024-04-01 DIAGNOSIS — G894 Chronic pain syndrome: Secondary | ICD-10-CM | POA: Diagnosis not present

## 2024-04-18 ENCOUNTER — Other Ambulatory Visit: Payer: Self-pay | Admitting: Urology

## 2024-04-18 DIAGNOSIS — N3289 Other specified disorders of bladder: Secondary | ICD-10-CM

## 2024-04-18 DIAGNOSIS — K589 Irritable bowel syndrome without diarrhea: Secondary | ICD-10-CM

## 2024-04-24 ENCOUNTER — Other Ambulatory Visit: Payer: Self-pay

## 2024-04-24 DIAGNOSIS — M62838 Other muscle spasm: Secondary | ICD-10-CM

## 2024-04-24 DIAGNOSIS — M6289 Other specified disorders of muscle: Secondary | ICD-10-CM

## 2024-04-24 DIAGNOSIS — G894 Chronic pain syndrome: Secondary | ICD-10-CM | POA: Diagnosis not present

## 2024-04-24 DIAGNOSIS — M47816 Spondylosis without myelopathy or radiculopathy, lumbar region: Secondary | ICD-10-CM | POA: Diagnosis not present

## 2024-04-24 NOTE — Telephone Encounter (Signed)
 Patient needing to have refills:   diazepam  (VALIUM ) 5 MG tablet  Hormone jell  Pharmacy: [facility] [phone] - [place], [place] - [address] Phone: [phone]  Fax: [phone]     Call: [phone]

## 2024-04-25 MED ORDER — DIAZEPAM 5 MG PO TABS: ORAL_TABLET | ORAL | 0 refills | Status: AC

## 2024-04-28 ENCOUNTER — Other Ambulatory Visit: Payer: Self-pay | Admitting: Family Medicine

## 2024-04-28 DIAGNOSIS — E538 Deficiency of other specified B group vitamins: Secondary | ICD-10-CM

## 2024-05-07 ENCOUNTER — Ambulatory Visit: Payer: Self-pay

## 2024-05-07 ENCOUNTER — Other Ambulatory Visit: Payer: Self-pay | Admitting: Family Medicine

## 2024-05-07 DIAGNOSIS — E538 Deficiency of other specified B group vitamins: Secondary | ICD-10-CM

## 2024-05-07 NOTE — Telephone Encounter (Signed)
 FYI Only or Action Required?: FYI only for provider.  Patient was last seen in primary care on 02/14/2024 by Billy Philippe SAUNDERS, NP.  Called Nurse Triage reporting Urinary Frequency.  Symptoms began several days ago.  Interventions attempted: Rest, hydration, or home remedies.  Symptoms are: gradually worsening.  Triage Disposition: See HCP Within 4 Hours (Or PCP Triage)  Patient/caregiver understands and will follow disposition?: Unsure  Copied from CRM [id number]. Topic: Clinical - Red Word Triage >> May 07, 2024  4:30 PM [name] [name] wrote: Reason for RMF:ejupzwu may think she has a bladder infection. Patient is having back pain and she is consistenly urinating Reason for Disposition  Side (flank) or lower back pain present  Answer Assessment - Initial Assessment Questions 1. SYMPTOM: What's the main symptom you're concerned about? (e.g., frequency, incontinence)     Urinary frequency 2. ONSET: When did the urinary frequency  start?     4-5 days 3. PAIN: Is there any pain? If Yes, ask: How bad is it? (Scale: 1-10; mild, moderate, severe)     6 4. CAUSE: What do you think is causing the symptoms?     UTI 5. OTHER SYMPTOMS: Do you have any other symptoms? (e.g., blood in urine, fever, flank pain, pain with urination)     Denies  Advised to go to UC.  Protocols used: Urinary Symptoms-A-AH

## 2024-05-09 ENCOUNTER — Other Ambulatory Visit: Payer: Self-pay | Admitting: Obstetrics and Gynecology

## 2024-05-09 DIAGNOSIS — N952 Postmenopausal atrophic vaginitis: Secondary | ICD-10-CM

## 2024-05-09 DIAGNOSIS — R350 Frequency of micturition: Secondary | ICD-10-CM | POA: Diagnosis not present

## 2024-06-02 DIAGNOSIS — M797 Fibromyalgia: Secondary | ICD-10-CM | POA: Diagnosis not present

## 2024-06-02 DIAGNOSIS — M5441 Lumbago with sciatica, right side: Secondary | ICD-10-CM | POA: Diagnosis not present

## 2024-06-02 DIAGNOSIS — M5442 Lumbago with sciatica, left side: Secondary | ICD-10-CM | POA: Diagnosis not present

## 2024-06-02 DIAGNOSIS — G894 Chronic pain syndrome: Secondary | ICD-10-CM | POA: Diagnosis not present

## 2024-06-11 ENCOUNTER — Encounter: Payer: Self-pay | Admitting: Neurology

## 2024-06-11 ENCOUNTER — Ambulatory Visit (INDEPENDENT_AMBULATORY_CARE_PROVIDER_SITE_OTHER): Admitting: Neurology

## 2024-06-11 VITALS — BP 148/83 | HR 92 | Ht 63.0 in | Wt 164.0 lb

## 2024-06-11 DIAGNOSIS — R413 Other amnesia: Secondary | ICD-10-CM

## 2024-06-11 DIAGNOSIS — R251 Tremor, unspecified: Secondary | ICD-10-CM | POA: Diagnosis not present

## 2024-06-11 NOTE — Patient Instructions (Signed)
 Continue current medications Please follow-up with your doctors, consider switching Adderall to a different medication for your ADHD symptoms  Please return if symptoms do get worse  There are well-accepted and sensible ways to reduce risk for Alzheimers disease and other degenerative brain disorders .  Exercise Daily Walk A daily 20 minute walk should be part of your routine. Disease related apathy can be a significant roadblock to exercise and the only way to overcome this is to make it a daily routine and perhaps have a reward at the end (something your loved one loves to eat or drink perhaps) or a personal trainer coming to the home can also be very useful. Most importantly, the patient is much more likely to exercise if the caregiver / spouse does it with him/her. In general a structured, repetitive schedule is best.  General Health: Any diseases which effect your body will effect your brain such as a pneumonia, urinary infection, blood clot, heart attack or stroke. Keep contact with your primary care doctor for regular follow ups.  Sleep. A good nights sleep is healthy for the brain. Seven hours is recommended. If you have insomnia or poor sleep habits we can give you some instructions. If you have sleep apnea wear your mask.  Diet: Eating a heart healthy diet is also a good idea; fish and poultry instead of red meat, nuts (mostly non-peanuts), vegetables, fruits, olive oil or canola oil (instead of butter), minimal salt (use other spices to flavor foods), whole grain rice, bread, cereal and pasta and wine in moderation.Research is now showing that the MIND diet, which is a combination of The Mediterranean diet and the DASH diet, is beneficial for cognitive processing and longevity. Information about this diet can be found in The MIND Diet, a book by Annitta Feeling, MS, RDN, and online at WildWildScience.es  Finances, Power of [address] and Advance Directives: You should  consider putting legal safeguards in place with regard to financial and medical decision making. While the spouse always has power of attorney for medical and financial issues in the absence of any form, you should consider what you want in case the spouse / caregiver is no longer around or capable of making decisions.
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
## 2024-06-17 ENCOUNTER — Encounter: Payer: Self-pay | Admitting: Internal Medicine

## 2024-06-24 ENCOUNTER — Ambulatory Visit
Admission: RE | Admit: 2024-06-24 | Discharge: 2024-06-24 | Disposition: A | Discharge status: Home / Self Care | Source: Ambulatory Visit | Attending: Neurology | Admitting: Neurology

## 2024-06-24 ENCOUNTER — Ambulatory Visit: Payer: Self-pay | Admitting: Neurology

## 2024-06-24 DIAGNOSIS — R413 Other amnesia: Secondary | ICD-10-CM | POA: Diagnosis not present

## 2024-06-25 ENCOUNTER — Encounter (HOSPITAL_BASED_OUTPATIENT_CLINIC_OR_DEPARTMENT_OTHER): Payer: Self-pay

## 2024-06-26 ENCOUNTER — Other Ambulatory Visit (HOSPITAL_BASED_OUTPATIENT_CLINIC_OR_DEPARTMENT_OTHER)

## 2024-06-29 ENCOUNTER — Other Ambulatory Visit: Payer: Self-pay | Admitting: Family Medicine

## 2024-06-29 DIAGNOSIS — I10 Essential (primary) hypertension: Secondary | ICD-10-CM

## 2024-06-30 ENCOUNTER — Other Ambulatory Visit: Payer: Self-pay

## 2024-06-30 DIAGNOSIS — M791 Myalgia, unspecified site: Secondary | ICD-10-CM | POA: Diagnosis not present

## 2024-06-30 DIAGNOSIS — M545 Low back pain, unspecified: Secondary | ICD-10-CM | POA: Diagnosis not present

## 2024-06-30 DIAGNOSIS — G894 Chronic pain syndrome: Secondary | ICD-10-CM | POA: Diagnosis not present

## 2024-06-30 DIAGNOSIS — N3010 Interstitial cystitis (chronic) without hematuria: Secondary | ICD-10-CM

## 2024-07-01 MED ORDER — PENTOSAN POLYSULFATE SODIUM 100 MG PO CAPS: 100.0000 mg | ORAL_CAPSULE | Freq: Three times a day (TID) | ORAL | 11 refills | Status: AC

## 2024-07-23 ENCOUNTER — Encounter (HOSPITAL_BASED_OUTPATIENT_CLINIC_OR_DEPARTMENT_OTHER): Payer: Self-pay

## 2024-07-24 ENCOUNTER — Ambulatory Visit (INDEPENDENT_AMBULATORY_CARE_PROVIDER_SITE_OTHER)

## 2024-07-24 DIAGNOSIS — E785 Hyperlipidemia, unspecified: Secondary | ICD-10-CM

## 2024-07-24 DIAGNOSIS — Z79899 Other long term (current) drug therapy: Secondary | ICD-10-CM | POA: Diagnosis not present

## 2024-07-24 DIAGNOSIS — I350 Nonrheumatic aortic (valve) stenosis: Secondary | ICD-10-CM | POA: Diagnosis not present

## 2024-07-24 DIAGNOSIS — Z952 Presence of prosthetic heart valve: Secondary | ICD-10-CM | POA: Diagnosis not present

## 2024-07-24 DIAGNOSIS — I10 Essential (primary) hypertension: Secondary | ICD-10-CM | POA: Diagnosis not present

## 2024-07-24 LAB — ECHOCARDIOGRAM COMPLETE
AR max vel: 1.48 cm2
AV Area VTI: 1.57 cm2
AV Area mean vel: 1.43 cm2
AV Mean grad: 8 mmHg
AV Peak grad: 14.6 mmHg
Ao pk vel: 1.91 m/s
Area-P 1/2: 3.91 cm2
Radius: 0.6 cm
S' Lateral: 1.85 cm

## 2024-07-25 ENCOUNTER — Ambulatory Visit: Payer: Self-pay | Admitting: Nurse Practitioner

## 2024-07-30 ENCOUNTER — Other Ambulatory Visit: Payer: Self-pay | Admitting: Family Medicine

## 2024-07-30 DIAGNOSIS — E538 Deficiency of other specified B group vitamins: Secondary | ICD-10-CM

## 2024-07-30 NOTE — Telephone Encounter (Signed)
 Copied from CRM [id number]. Topic: Clinical - Medication Question >> Jul 30, 2024  2:02 PM Roselie BROCKS wrote: Reason for CRM: Patients is requesting vitamin B shots be sent to pharmacy to administer her self,  [id number] - [place],  - [address] [place] [address] Phone: [phone] Fax: [phone]

## 2024-07-31 MED ORDER — CYANOCOBALAMIN 1000 MCG/ML IJ SOLN: 1000.0000 ug | INTRAMUSCULAR | 2 refills | Status: AC

## 2024-08-05 ENCOUNTER — Other Ambulatory Visit: Payer: Self-pay

## 2024-08-05 DIAGNOSIS — K589 Irritable bowel syndrome without diarrhea: Secondary | ICD-10-CM

## 2024-08-05 DIAGNOSIS — N3289 Other specified disorders of bladder: Secondary | ICD-10-CM

## 2024-08-05 NOTE — Telephone Encounter (Signed)
 Rx refill request sent to appropriate provider

## 2024-08-08 MED ORDER — DICYCLOMINE HCL 10 MG PO CAPS: 10.0000 mg | ORAL_CAPSULE | Freq: Three times a day (TID) | ORAL | 2 refills | Status: AC

## 2024-08-12 ENCOUNTER — Telehealth: Payer: Self-pay

## 2024-08-12 NOTE — Telephone Encounter (Addendum)
 Return call to pharmacy on medication clarification.spoke to [place] at [facility] making her aware [facility]. Alan voiced she will reach out to [facility] for [facility] transfer.

## 2024-08-27 ENCOUNTER — Ambulatory Visit

## 2024-08-27 VITALS — BP 118/62 | HR 92 | Temp 96.1°F | Ht 63.0 in | Wt 161.0 lb

## 2024-08-27 DIAGNOSIS — Z0000 Encounter for general adult medical examination without abnormal findings: Secondary | ICD-10-CM | POA: Diagnosis not present

## 2024-08-27 NOTE — Progress Notes (Signed)
 Chief Complaint  Patient presents with   Medicare Wellness     Subjective:   [name] [name] is a 68 y.o. female who presents for a Medicare Annual Wellness Visit.  Visit info / Clinical Intake: Medicare Wellness Visit Type:: Subsequent Annual Wellness Visit Persons participating in visit and providing information:: patient Medicare Wellness Visit Mode:: In-person (required for WTM) Interpreter Needed?: No Pre-visit prep was completed: no AWV questionnaire completed by patient prior to visit?: no Living arrangements:: (!) lives alone Patient's Overall Health Status Rating: good Typical amount of pain: some (Lower back pain. Followed by medical attention) Does pain affect daily life?: (!) yes (Followed by medical attention) Are you currently prescribed opioids?: (!) yes  Dietary Habits and Nutritional Risks How many meals a day?: 2 Eats fruit and vegetables daily?: (!) no Most meals are obtained by: preparing own meals In the last 2 weeks, have you had any of the following?: none Diabetic:: no  Functional Status Activities of Daily Living (to include ambulation/medication): Independent Ambulation: Independent with device- listed below Home Assistive Devices/Equipment: Eyeglasses; Dentures (specify type) Medication Administration: Independent Home Management (perform basic housework or laundry): Independent Manage your own finances?: yes Primary transportation is: driving Concerns about vision?: no *vision screening is required for WTM* Concerns about hearing?: no  Fall Screening Falls in the past year?: 0 Number of falls in past year: 0 Was there an injury with Fall?: 0 Fall Risk Category Calculator: 0 Patient Fall Risk Level: Low Fall Risk  Fall Risk Patient at Risk for Falls Due to: No Fall Risks Fall risk Follow up: Falls evaluation completed  Home and Transportation Safety: All rugs have non-skid backing?: N/A, no rugs All stairs or steps have railings?:  N/A, no stairs Grab bars in the bathtub or shower?: yes Have non-skid surface in bathtub or shower?: yes Good home lighting?: yes Regular seat belt use?: yes Hospital stays in the last year:: no  Cognitive Assessment Difficulty concentrating, remembering, or making decisions? : yes Will 6CIT or Mini Cog be Completed: yes What year is it?: 0 points What month is it?: 0 points Give patient an address phrase to remember (5 components): 2 [employer] [place] About what time is it?: 0 points Count backwards from 20 to 1: 0 points Say the months of the year in reverse: 0 points Repeat the address phrase from earlier: 4 points 6 CIT Score: 4 points  Advance Directives (For Healthcare) Does Patient Have a Medical Advance Directive?: No Would patient like information on creating a medical advance directive?: Yes (ED - Information included in AVS)  Reviewed/Updated  Reviewed/Updated: Reviewed All (Medical, Surgical, Family, Medications, Allergies, Care Teams, Patient Goals)    Allergies (verified) Ciprofloxacin , Macrobid  [nitrofurantoin  macrocrystal], Ampicillin, Atorvastatin, Buspirone hcl, Ezetimibe-simvastatin, Latex, Lyrica [pregabalin], Morphine  and codeine , Penicillins, Sulfa  antibiotics, and Sulfasalazine   Current Medications (verified) Outpatient Encounter Medications as of 08/27/2024  Medication Sig   acyclovir  ointment (ZOVIRAX ) 5 % Apply 1 application topically every 4 (four) hours as needed.   alendronate  (FOSAMAX ) 70 MG tablet Take 1 tablet (70 mg total) by mouth every 7 (seven) days. Take with a full glass of water on an empty stomach.   ALPRAZolam  (XANAX ) 1 MG tablet Take 1 tablet (1 mg total) by mouth at bedtime as needed for sleep.   amphetamine -dextroamphetamine  (ADDERALL) 30 MG tablet Take 30 mg by mouth daily.   ARIPiprazole (ABILIFY PO) Take 2 mg by mouth at bedtime.   cyanocobalamin  (VITAMIN B12) 1000 MCG/ML injection  Inject 1 mL (1,000 mcg total) into the muscle  every 30 (thirty) days.   cyclobenzaprine  (FLEXERIL ) 10 MG tablet Take 10 mg by mouth 3 (three) times daily as needed for muscle spasms.   diazepam  (VALIUM ) 5 MG tablet Place 1 tablet vaginally nightly as needed for muscle spasm/ pelvic pain.   diclofenac Sodium (VOLTAREN) 1 % GEL Apply 2 g topically 3 (three) times daily as needed (pain).   dicyclomine  (BENTYL ) 10 MG capsule Take 1 capsule (10 mg total) by mouth every 8 (eight) hours.   donepezil  (ARICEPT ) 5 MG tablet Take 1 tablet (5 mg total) by mouth at bedtime.   estradiol  (ESTRACE ) 0.1 MG/GM vaginal cream PLACE 0.5GRAMS VAGINALLY NIGHTLY FOR 2 WEEKS, THEN USE TWICE A WEEK VAGINALLY THEREAFTER   Evolocumab  (REPATHA  SURECLICK) 140 MG/ML SOAJ Inject 140 mg into the skin every 14 (fourteen) days.   gabapentin  (NEURONTIN ) 300 MG capsule Take 300 mg by mouth as needed.   Heating Pad PADS Use 3 times weekly   hydrOXYzine  (ATARAX ) 25 MG tablet Take 1 tablet (25 mg total) by mouth at bedtime.   losartan  (COZAAR ) 50 MG tablet TAKE 1 TABLET BY MOUTH DAILY   meloxicam (MOBIC) 15 MG tablet Take 15 mg by mouth daily.   ondansetron  (ZOFRAN -ODT) 4 MG disintegrating tablet DISSOLVE 1 TABLET BY MOUTH EVERY 8 HOURS AS NEEDED FOR NAUSEA   Oxycodone  HCl 10 MG TABS Take 10 mg by mouth 4 (four) times daily as needed (pain).   pentosan polysulfate (ELMIRON ) 100 MG capsule Take 1 capsule (100 mg total) by mouth 3 (three) times daily.   Vilazodone HCl (VIIBRYD) 40 MG TABS TAKE (1) TABLET BY MOUTH ONCE DAILY WITH FULL MEAL.   No facility-administered encounter medications on file as of 08/27/2024.    History: Past Medical History:  Diagnosis Date   ABDOMINAL PAIN, LOWER 02/16/2009   Anxiety    Aortic stenosis    APPENDECTOMY, HX OF 09/11/2007   ATTENTION DEFICIT DISORDER, ADULT 02/02/2009   BACK PAIN 07/16/2007   chronic   Back pain    spinal stimulator in place   CERVICAL RADICULOPATHY, RIGHT 07/16/2007   CHEST PAIN, ACUTE 09/03/2008   Chronic  abdominal pain    Chronic pain syndrome 09/11/2007   Complication of anesthesia    woke up during surgeries few times and woke up during endoscopy and colonscopy few weeks ago, in terrible pain   DDD (degenerative disc disease), lumbosacral    DEPRESSION/ANXIETY 06/17/2010   Dysuria 02/20/2008   Elevated liver enzymes 1 and  1/2 months ago   Family history of anesthesia complication    mother woke up during surgeries   Fibromyalgia    Functional GI symptoms    FX, RAMUS NOS, CLOSED 07/16/2007   GERD (gastroesophageal reflux disease)    no current problems   H/O failed conscious sedation    PT STATES SHE IS HARD TO SEDATE!   HEMORRHOIDS 09/11/2007   HIATAL HERNIA, HX OF 09/11/2007   History of kidney stones    passed stones   HX, PERSONAL, MUSCULOSKELETAL DISORD NEC 07/16/2007   HX, PERSONAL, URINARY CALCULI 07/16/2007   Hyperlipidemia    on crestor    Irritable bowel syndrome 09/11/2007   Memory loss    NEOPLASM, SKIN, UNCERTAIN BEHAVIOR 11/23/2008   OSTEOARTHRITIS 09/11/2007   oa   OSTEOPENIA 10/23/2007   Pneumonia    PONV (postoperative nausea and vomiting)    ROTATOR CUFF REPAIR, HX OF 09/11/2007   S/P TAVR (transcatheter aortic valve  replacement) 06/28/2021   s/p TAVR with 26mm Evolut Pro + via the TF approach by Dr. Verlin and Dr. Lucas   Stroke [facility])    mini stroke years ago per patient   Suicidal ideations    SYMPTOM, PAIN, ABDOMINAL, RIGHT UP QUADRANT 07/16/2007   TAH/BSO, HX OF 09/11/2007   UTI 10/06/2010   VAGINITIS, ATROPHIC, POSTMENOPAUSAL 07/16/2007   Past Surgical History:  Procedure Laterality Date   ABDOMINAL HYSTERECTOMY  1976   removed due to abnormal paps; no uterine cancer   APPENDECTOMY  1969   BILATERAL OOPHORECTOMY  1993   bone pushed back into  place after fracture Left 25-30 yrs ago   face   CARDIAC CATHETERIZATION     cervical radiculopathy  20 yrs ago   RT   CHOLECYSTECTOMY N/A 02/18/2014   Procedure: LAPAROSCOPIC CHOLECYSTECTOMY;   Surgeon: Vicenta DELENA Poli, MD;  Location: [facility] ORS;  Service: General;  Laterality: N/A;   COLONOSCOPY     DILATION AND CURETTAGE OF UTERUS  age 56   ESOPHAGOGASTRODUODENOSCOPY     MULTIPLE EXTRACTIONS WITH ALVEOLOPLASTY N/A 05/26/2021   Procedure: MULTIPLE EXTRACTION WITH ALVEOLOPLASTY;  Surgeon: Celena Lum NOVAK, DMD;  Location: [facility] OR;  Service: Dentistry;  Laterality: N/A;   ORIF ANKLE FRACTURE Left 07/12/2017   Procedure: OPEN REDUCTION INTERNAL FIXATION (ORIF) LEFT ANKLE FRACTURE;  Surgeon: Margrette Taft BRAVO, MD;  Location: [facility];  Service: Orthopedics;  Laterality: Left;   RIGHT/LEFT HEART CATH AND CORONARY ANGIOGRAPHY N/A 02/03/2021   Procedure: RIGHT/LEFT HEART CATH AND CORONARY ANGIOGRAPHY;  Surgeon: Verlin Lonni BIRCH, MD;  Location: [facility];  Service: Cardiovascular;  Laterality: N/A;   ROTATOR CUFF REPAIR  1998   right   SPINAL CORD STIMULATOR IMPLANT  2005   lower back, pt turned off 2-3 weeks ago due to causing pain   TEE WITHOUT CARDIOVERSION N/A 06/28/2021   Procedure: TRANSESOPHAGEAL ECHOCARDIOGRAM (TEE);  Surgeon: Verlin Lonni BIRCH, MD;  Location: [facility] OR;  Service: Open Heart Surgery;  Laterality: N/A;   TRANSCATHETER AORTIC VALVE REPLACEMENT, TRANSFEMORAL N/A 06/28/2021   Procedure: TRANSCATHETER AORTIC VALVE REPLACEMENT, TRANSFEMORAL USING A MEDTRONIC AORTIC VALVE.;  Surgeon: Verlin Lonni BIRCH, MD;  Location: [facility] OR;  Service: Open Heart Surgery;  Laterality: N/A;   TUBAL LIGATION     Family History  Problem Relation Age of Onset   Heart disease Mother        CAD/ valvular disease   Kidney disease Mother        lost a kidney -  unknown cause   COPD Mother    COPD Father    Heart disease Father 58       MI   Heart disease Sister 99   Other Sister        complications from pain medications   Emphysema Sister    Heart disease Maternal Aunt    Brain cancer Maternal Aunt    Breast cancer Paternal Aunt    Heart disease Maternal  Grandmother    Stroke Maternal Grandfather    Colon cancer Cousin 50       paternal   High Cholesterol Brother        CAD   High blood pressure Brother    Alcoholism Brother    Cancer Other        Colon   Social History   Occupational History   Occupation: Hairdresser-disabled    Employer: [employer]  Tobacco Use   Smoking status: Never   Smokeless  tobacco: Never  Vaping Use   Vaping status: Never Used  Substance and Sexual Activity   Alcohol use: No   Drug use: No   Sexual activity: Not Currently    Birth control/protection: Surgical    Comment: Hysterectomy, dysperunia secondary to atrophic vaginitis   Tobacco Counseling Counseling given: No  SDOH Screenings   Food Insecurity: No Food Insecurity (08/27/2024)  Housing: Unknown (08/27/2024)  Transportation Needs: No Transportation Needs (08/27/2024)  Utilities: Not At Risk (08/27/2024)  Alcohol Screen: Low Risk  (08/20/2023)  Depression (PHQ2-9): High Risk (08/27/2024)  Financial Resource Strain: Low Risk  (01/31/2024)   Received from [facility]  Physical Activity: Insufficiently Active (08/27/2024)  Social Connections: Socially Isolated (08/27/2024)  Stress: Stress Concern Present (08/27/2024)  Tobacco Use: Low Risk  (08/27/2024)  Health Literacy: Adequate Health Literacy (08/27/2024)   See flowsheets for full screening details  Depression Screen PHQ 2 & 9 Depression Scale- Over the past 2 weeks, how often have you been bothered by any of the following problems? Little interest or pleasure in doing things: 0 Feeling down, depressed, or hopeless (PHQ Adolescent also includes...irritable): 3 (Currently receiving treatment) PHQ-2 Total Score: 3 Trouble falling or staying asleep, or sleeping too much: 3 (Followed by medical attention) Feeling tired or having little energy: 3 Poor appetite or overeating (PHQ Adolescent also includes...weight loss): 0 Feeling bad about yourself - or that you are a failure or have let  yourself or your family down: 0 Trouble concentrating on things, such as reading the newspaper or watching television (PHQ Adolescent also includes...like school work): 3 (Followed by medical attention) Moving or speaking so slowly that other people could have noticed. Or the opposite - being so fidgety or restless that you have been moving around a lot more than usual: 0 Thoughts that you would be better off dead, or of hurting yourself in some way: 0 PHQ-9 Total Score: 12 If you checked off any problems, how difficult have these problems made it for you to do your work, take care of things at home, or get along with other people?: Somewhat difficult  Depression Treatment Depression Interventions/Treatment : Currently on Treatment     Goals Addressed               This Visit's Progress     Increase physical activity (pt-stated)        Get more active and lose weight.             Objective:    Today's Vitals   08/27/24 1518  BP: 118/62  Pulse: 92  Temp: (!) 96.1 F (35.6 C)  TempSrc: Oral  SpO2: 93%  Weight: 161 lb (73 kg)  Height: 5' 3 (1.6 m)   Body mass index is 28.52 kg/m.  Hearing/Vision screen Hearing Screening - Comments:: Denies hearing difficulties   Vision Screening - Comments:: Wears rx glasses - up to date with routine eye exams with  [facility] Immunizations and Health Maintenance Health Maintenance  Topic Date Due   Colonoscopy  01/31/2024   Influenza Vaccine  04/25/2024   Mammogram  05/06/2025   Medicare Annual Wellness (AWV)  08/27/2025   DTaP/Tdap/Td (6 - Td or Tdap) 06/10/2031   Pneumococcal Vaccine: 50+ Years  Completed   Bone Density Scan  Completed   Hepatitis C Screening  Completed   Zoster Vaccines- Shingrix  Completed   Meningococcal B Vaccine  Aged Out   COVID-19 Vaccine  Discontinued  Assessment/Plan:  This is a routine wellness examination for Leeandra.  Patient Care Team: Billy Philippe SAUNDERS, NP as PCP - General  (Family Medicine) Mahar, Elida, PA-C as Physician Assistant (Orthopedic Surgery) Mona Vinie BROCKS, MD as Consulting Physician (Cardiology) Vincente Grip, MD as Consulting Physician (Psychiatry) Gerldine Lauraine BROCKS, FNP (Inactive) (Urology) Mahar, Elida, PA-C as Physician Assistant (Orthopedic Surgery)  I have personally reviewed and noted the following in the patient's chart:   Medical and social history Use of alcohol, tobacco or illicit drugs  Current medications and supplements including opioid prescriptions. Functional ability and status Nutritional status Physical activity Advanced directives List of other physicians Hospitalizations, surgeries, and ER visits in previous 12 months Vitals Screenings to include cognitive, depression, and falls Referrals and appointments  No orders of the defined types were placed in this encounter.  In addition, I have reviewed and discussed with patient certain preventive protocols, quality metrics, and best practice recommendations. A written personalized care plan for preventive services as well as general preventive health recommendations were provided to patient.   Rojelio LELON Blush, LPN   [id number]   Return in 1 year on 09/02/25  After Visit Summary: (In Person-Printed) AVS printed and given to the patient  Nurse Notes: None

## 2024-08-27 NOTE — Patient Instructions (Addendum)
 Ms. Selman,  Thank you for taking the time for your Medicare Wellness Visit. I appreciate your continued commitment to your health goals. Please review the care plan we discussed, and feel free to reach out if I can assist you further.  Please note that Annual Wellness Visits do not include a physical exam. Some assessments may be limited, especially if the visit was conducted virtually. If needed, we may recommend an in-person follow-up with your provider.  Ongoing Care Seeing your primary care provider every 3 to 6 months helps us  monitor your health and provide consistent, personalized care.   Referrals If a referral was made during today's visit and you haven't received any updates within two weeks, please contact the referred provider directly to check on the status.  Recommended Screenings:  Health Maintenance  Topic Date Due   Colon Cancer Screening  01/31/2024   Flu Shot  04/25/2024   Breast Cancer Screening  05/06/2025   Medicare Annual Wellness Visit  08/27/2025   DTaP/Tdap/Td vaccine (6 - Td or Tdap) 06/10/2031   Pneumococcal Vaccine for age over 56  Completed   Osteoporosis screening with Bone Density Scan  Completed   Hepatitis C Screening  Completed   Zoster (Shingles) Vaccine  Completed   Meningitis B Vaccine  Aged Out   COVID-19 Vaccine  Discontinued   Opioid Pain Medicine Management Opioids are powerful medicines that are used to treat moderate to severe pain. When used for short periods of time, they can help you to: Sleep better. Do better in physical or occupational therapy. Feel better in the first few days after an injury. Recover from surgery. Opioids should be taken with the supervision of a trained health care provider. They should be taken for the shortest period of time possible. This is because opioids can be addictive, and the longer you take opioids, the greater your risk of addiction. This addiction can also be called opioid use disorder. What are  the risks? Using opioid pain medicines for longer than 3 days increases your risk of side effects. Side effects include: Constipation. Nausea and vomiting. Breathing difficulties (respiratory depression). Drowsiness. Confusion. Opioid use disorder. Itching. Taking opioid pain medicine for a long period of time can affect your ability to do daily tasks. It also puts you at risk for: Motor vehicle crashes. Depression. Suicide. Heart attack. Overdose, which can be life-threatening. What is a pain treatment plan? A pain treatment plan is an agreement between you and your health care provider. Pain is unique to each person, and treatments vary depending on your condition. To manage your pain, you and your health care provider need to work together. To help you do this: Discuss the goals of your treatment, including how much pain you might expect to have and how you will manage the pain. Review the risks and benefits of taking opioid medicines. Remember that a good treatment plan uses more than one approach and minimizes the chance of side effects. Be honest about the amount of medicines you take and about any drug or alcohol use. Get pain medicine prescriptions from only one health care provider. Pain can be managed with many types of alternative treatments. Ask your health care provider to refer you to one or more specialists who can help you manage pain through: Physical or occupational therapy. Counseling (cognitive behavioral therapy). Good nutrition. Biofeedback. Massage. Meditation. Non-opioid medicine. Following a gentle exercise program. How to use opioid pain medicine Taking medicine Take your pain medicine exactly as told by  your health care provider. Take it only when you need it. If your pain gets less severe, you may take less than your prescribed dose if your health care provider approves. If you are not having pain, do nottake pain medicine unless your health care  provider tells you to take it. If your pain is severe, do nottry to treat it yourself by taking more pills than instructed on your prescription. Contact your health care provider for help. Write down the times when you take your pain medicine. It is easy to become confused while on pain medicine. Writing the time can help you avoid overdose. Take other over-the-counter or prescription medicines only as told by your health care provider. Keeping yourself and others safe  While you are taking opioid pain medicine: Do not drive, use machinery, or power tools. Do not sign legal documents. Do not drink alcohol. Do not take sleeping pills. Do not supervise children by yourself. Do not do activities that require climbing or being in high places. Do not go to a lake, river, ocean, spa, or swimming pool. Do not share your pain medicine with anyone. Keep pain medicine in a locked cabinet or in a secure area where pets and children cannot reach it. Stopping your use of opioids If you have been taking opioid medicine for more than a few weeks, you may need to slowly decrease (taper) how much you take until you stop completely. Tapering your use of opioids can decrease your risk of symptoms of withdrawal, such as: Pain and cramping in the abdomen. Nausea. Sweating. Sleepiness. Restlessness. Uncontrollable shaking (tremors). Cravings for the medicine. Do not attempt to taper your use of opioids on your own. Talk with your health care provider about how to do this. Your health care provider may prescribe a step-down schedule based on how much medicine you are taking and how long you have been taking it. Getting rid of leftover pills Do not save any leftover pills. Get rid of leftover pills safely by: Taking the medicine to a prescription take-back program. This is usually offered by the county or law enforcement. Bringing them to a pharmacy that has a drug disposal container. Flushing them down the  toilet. Check the label or package insert of your medicine to see whether this is safe to do. Throwing them out in the trash. Check the label or package insert of your medicine to see whether this is safe to do. If it is safe to throw it out, remove the medicine from the original container, put it into a sealable bag or container, and mix it with used coffee grounds, food scraps, dirt, or cat litter before putting it in the trash. Follow these instructions at home: Activity Do exercises as told by your health care provider. Avoid activities that make your pain worse. Return to your normal activities as told by your health care provider. Ask your health care provider what activities are safe for you. General instructions You may need to take these actions to prevent or treat constipation: Drink enough fluid to keep your urine pale yellow. Take over-the-counter or prescription medicines. Eat foods that are high in fiber, such as beans, whole grains, and fresh fruits and vegetables. Limit foods that are high in fat and processed sugars, such as fried or sweet foods. Keep all follow-up visits. This is important. Where to find support If you have been taking opioids for a long time, you may benefit from receiving support for quitting from a local support  group or counselor. Ask your health care provider for a referral to these resources in your area. Where to find more information Centers for Disease Control and Prevention (CDC): footballexhibition.com.br [place] Food and Drug Administration (FDA): pumpkinsearch.com.ee Get help right away if: You may have taken too much of an opioid (overdosed). Common symptoms of an overdose: Your breathing is slower or more shallow than normal. You have a very slow heartbeat (pulse). You have slurred speech. You have nausea and vomiting. Your pupils become very small. You have other potential symptoms: You are very confused. You faint or feel like you will faint. You have cold,  clammy skin. You have blue lips or fingernails. You have thoughts of harming yourself or harming others. These symptoms may represent a serious problem that is an emergency. Do not wait to see if the symptoms will go away. Get medical help right away. Call your local emergency services (911 in the [place] Do not drive yourself to the hospital.  If you ever feel like you may hurt yourself or others, or have thoughts about taking your own life, get help right away. Go to your nearest emergency department or: Call your local emergency services (911 in the [place] Call the [facility] (415 216 6389 in the [place] Call a suicide crisis helpline, such as the National Suicide Prevention Lifeline at 540-626-9657 or 988 in the [place] This is open 24 hours a day in the [place] If you're a Veteran: Call 988 and press 1. This is open 24 hours a day. Text the [employer] at (279)423-4719. Summary Opioid medicines can help you manage moderate to severe pain for a short period of time. A pain treatment plan is an agreement between you and your health care provider. Discuss the goals of your treatment, including how much pain you might expect to have and how you will manage the pain. If you think that you or someone else may have taken too much of an opioid, get medical help right away. This information is not intended to replace advice given to you by your health care provider. Make sure you discuss any questions you have with your health care provider. Document Revised: 06/18/2023 Document Reviewed: 12/22/2020 Elsevier Patient Education  2024 Elsevier Inc.    08/27/2024    3:52 PM  Advanced Directives  Does Patient Have a Medical Advance Directive? No  Would patient like information on creating a medical advance directive? Yes (ED - Information included in AVS)    Vision: Annual vision screenings are recommended for early detection of glaucoma, cataracts, and diabetic retinopathy. These  exams can also reveal signs of chronic conditions such as diabetes and high blood pressure.  Dental: Annual dental screenings help detect early signs of oral cancer, gum disease, and other conditions linked to overall health, including heart disease and diabetes.  Please see the attached documents for additional preventive care recommendations.

## 2024-08-29 ENCOUNTER — Telehealth: Payer: Self-pay

## 2024-09-01 ENCOUNTER — Telehealth: Payer: Self-pay

## 2024-09-01 NOTE — Telephone Encounter (Signed)
 RN spoke with patient about scheduling her colonoscopy with Dr. Avram due to recall. Patient stated she took a cologuard test at the first of this year 2025, unable to recall exact date, and reports it was negative.   RN informed patient we would call her back if Dr. Avram still recommends a colonoscopy. Pt verbalizes understanding.

## 2024-09-03 ENCOUNTER — Telehealth: Payer: Self-pay

## 2024-09-03 NOTE — Telephone Encounter (Signed)
 Good afternoon Dr. Avram,  I was trying to schedule pt's recall colonoscopy and pt states she had a negative Cologuard the beginning of this year 2025, unable to recall exact date. I explained to the patient I would see what you advise in regards to her next colonoscopy. Please advise. Thank you  Last colonoscopy on 01/30/2014- 10 year recall.

## 2024-09-04 NOTE — Telephone Encounter (Signed)
 Since the patient has a negative Cologuard, they do not need a colonoscopy now.  Good work.  She should either repeat a Cologuard in 3 years or do a colonoscopy then.  She can coordinate through PCP.

## 2024-09-04 NOTE — Telephone Encounter (Signed)
 RN called patient and discussed recommendations from Dr. Avram.  Informed patient I will put in a 3 year recall and she can let us  know then what her PCP recommends in regards to another colonoscopy.  Patient verbalizes understanding.     Thank you Dr. Avram for your time and guidance.

## 2024-09-08 NOTE — Progress Notes (Unsigned)
 Cardiology Office Note   Date:  09/11/2024  ID:  [name] [name], [name] [name] Mar 07, 1956, MRN [id number] PCP: Billy Philippe SAUNDERS, NP   HeartCare Providers Cardiologist:  None     PMH Dyslipidemia Family history early CAD Mother died age 68 from heart disease Murmur Bicuspid aortic valve Severe aortic stenosis S/p TAVR October 2022 with (26 mm Medtronic Evolut Pro THV) No evidence of coronary artery disease on cath 01/2021 Chronic back pain on opioid therapy managed by pain management   Referred to cardiology and seen by Dr. Mona for management of dyslipidemia.  Lipids at that time were total cholesterol 274, HDL 45, LDL 183, and triglycerides 211.  Hemoglobin A1c was 5.1.  She had tried atorvastatin which caused flulike symptoms and Vytorin which caused unknown side effects.  She was not on any lipid-lowering therapy at the time.  Family history of heart disease in her mother who died at age 72 and her sisters.  She has a younger brother who has what sounds like an enlarged heart.  She was told at age 89 that she has a heart murmur which kept her from getting a job she wanted.  She was told very little about this.  Echo reviewed by Dr. Mona revealed what appeared to be significant calcification of the aortic valve and possible bicuspid valve with 2 fused raphe.  Moderate aortic stenosis with mean gradient 22 mmHg and mild to moderate aortic insufficiency.  She did not recall these findings.  She was referred to Structural heart team and underwent TAVR October 2022 for severe aortic stenosis.  Cardiac cath prior to TAVR revealed no significant coronary artery disease.  She had no postoperative complications.  Echo 06/26/2022 revealed LVEF > 75%, grade 1 diastolic dysfunction, normal RV, mild MR, peak gradient 26.8 mmHg (increased from 10 mmHg on echo 06/2021) in the setting of hyperdynamic LV function, no AI or AS. She was advised to have repeat echo in 1 year.   Last clinic visit was  06/29/2022 with Dr. Mona.  She was asymptomatic.  She was transitioned to aspirin  81 mg daily April 2023 but was not on it at time of visit.  She was advised to resume aspirin .  There was concern for elevated LP(a), but this was not elevated.  She was advised to start Repatha  140 mg every 14 days. Lipid panel 02/14/2024 with total cholesterol 230, triglycerides 66, HDL 72.5, and LDL 144.  Seen by me on 03/25/24 with concerns about shortness of breath, especially in the heat, and sometimes feeling anxious, which affects her breathing. O ccasional chest pain, which she attributes to heartburn or gas, and occasional palpitations, particularly when she has headaches. Does not regularly monitor BP at home. Discontinued Repatha  injections due to concerns about memory problems and is scheduled to discuss this with a neurologist; unsure of the number of injections completed. Trying to incorporate more physical activity into her routine to lose weight and alleviate foot pain, and she is concerned about her weight affecting her breathing. Is limited by back pain which is severe at times and requires use of opioids for  pain management. She maintains a garden and engages in outdoor activities but experiences shortness of breath and fatigue at times during exertion. She does not walk long distances but tries to stay active.   History of Present Illness Discussed the use of AI scribe software for clinical note transcription with the patient, who gave verbal consent to proceed. History of Present Illness [name] V  [name] is a pleasant 68 year old female who presents for follow-up of hyperlipidemia.   No Repatha  injection   ROS: See HPI  Studies Reviewed       Lipoprotein (a)  Date/Time Value Ref Range Status  11/03/2020 03:46 PM <8.4 <75.0 nmol/L Final    Comment:    **Results verified by repeat testing** Note:  Values greater than or equal to 75.0 nmol/L may        indicate an independent risk factor for  CHD,        but must be evaluated with caution when applied        to non-Caucasian populations due to the        influence of genetic factors on Lp(a) across        ethnicities.     Risk Assessment/Calculations           Physical Exam VS:  BP 132/80   Pulse 85   Ht 5' 3 (1.6 m)   Wt 169 lb 14.4 oz (77.1 kg)   SpO2 96%   BMI 30.10 kg/m    Wt Readings from Last 3 Encounters:  09/11/24 169 lb 14.4 oz (77.1 kg)  08/27/24 161 lb (73 kg)  06/11/24 164 lb (74.4 kg)    GEN: Well nourished, well developed in no acute distress NECK: No JVD; No carotid bruits CARDIAC: RRR, no murmurs, rubs, gallops RESPIRATORY:  Clear to auscultation without rales, wheezing or rhonchi  ABDOMEN: Soft, non-tender, non-distended EXTREMITIES:  No edema; No deformity   Assessment & Plan Hyperlipidemia LDL goal < 70 Lipid panel completed 02/14/2024 with total cholesterol 230, triglycerides 66, HDL 72, LDL 144.  She completed some injections on Repatha , unsure of how many, and stopped the medication due to concerns with memory loss. Discussed the benefits of Repatha  for cholesterol management and dementia prevention. We discussed dietary recommendations for heart healthy diet and to lower cholesterol including reducing saturated fats, processed foods, sugar, and simple carbohydrates and eating a plant forward diet with lots of  vegetables, fruits, and whole grains.  She is agreeable to restart Repatha  140 mg every 14 days.  I have asked her to notify me if she has concerning side effects or if discussion with neurologist leads her to discontinue it. Will plan to recheck cholesterol levels in three months.  CAD Chest pain/DOE Left heart cath 01/2021 with no angiographic evidence of CAD.  She reports occasional chest pains and shortness of breath not worsened with exertion. She is active but does not exercise on a regular basis. Symptoms atypical for angina, no indication for further ischemia evaluation at this  time. Focus on heart healthy and aiming for at least 150 minutes of moderate intensity exercise each week.   Hypertension BP is well controlled. Renal function stable on labs completed by PCP 02/14/24. Management per PCP. No change in anti-hypertensive therapy today.   Severe AS s/p TAVR She underwent TAVR 06/2021.  Echo 06/26/2022 with normal LVEF, increased TAVR gradients felt to be secondary to hyperdynamic LV function.  Recommendation to repeat echo in 1 year.  We will get updated echo to assess valve function. She has occasional shortness of breath but no significant concerns.   Chronic pain  Chronic back pain is managed with oxycodone  and gabapentin . Management per spine center. She is concerned about memory loss and has gotten lost recently when driving to familiar locations. She takes opioids on a regular basis due to pain and does not feel she can  reduce her intake.    Memory loss   She is concerned about worsening memory loss and feels it may be exacerbated by medications.  Advised that poorly controlled cholesterol can contribute to dementia.  She is willing to restart Repatha  as noted above.  Awaiting neurology evaluation for further assessment.        Dispo: ***  Signed, Rosaline Bane, NP-C

## 2024-09-09 ENCOUNTER — Other Ambulatory Visit: Payer: Self-pay

## 2024-09-09 DIAGNOSIS — M6289 Other specified disorders of muscle: Secondary | ICD-10-CM

## 2024-09-09 DIAGNOSIS — N3289 Other specified disorders of bladder: Secondary | ICD-10-CM

## 2024-09-09 DIAGNOSIS — K589 Irritable bowel syndrome without diarrhea: Secondary | ICD-10-CM

## 2024-09-09 DIAGNOSIS — M62838 Other muscle spasm: Secondary | ICD-10-CM

## 2024-09-09 NOTE — Telephone Encounter (Addendum)
 Patient state's she need refill on her medication and will be changing her pharmacy at the first of the year. Pt is aware a message will be sent to MD, [place]. Voiced understanding

## 2024-09-09 NOTE — Telephone Encounter (Signed)
° ° °  Patient Rx was sent to [facility] in October and pt state's she will pick it from pharmacy.  Pt state's at the first of the year she will change her pharmacy to [facility] and all medication will need to go to [place]. Pt is aware of the documentation and voiced understanding.

## 2024-09-09 NOTE — Telephone Encounter (Signed)
 Open in error

## 2024-09-10 LAB — NMR, LIPOPROFILE
Cholesterol, Total: 233 mg/dL — ABNORMAL HIGH (ref 100–199)
HDL Particle Number: 35 umol/L (ref >=30.5)
HDL-C: 61 mg/dL (ref >39)
LDL Particle Number: 1378 nmol/L — ABNORMAL HIGH (ref <1000)
LDL Size: 22 nm (ref >20.5)
LDL-C (NIH Calc): 143 mg/dL — ABNORMAL HIGH (ref 0–99)
LP-IR Score: 30 (ref <=45)
Small LDL Particle Number: 268 nmol/L (ref <=527)
Triglycerides: 161 mg/dL — ABNORMAL HIGH (ref 0–149)

## 2024-09-11 ENCOUNTER — Ambulatory Visit (INDEPENDENT_AMBULATORY_CARE_PROVIDER_SITE_OTHER): Admitting: Nurse Practitioner

## 2024-09-11 ENCOUNTER — Encounter (HOSPITAL_BASED_OUTPATIENT_CLINIC_OR_DEPARTMENT_OTHER): Payer: Self-pay | Admitting: Nurse Practitioner

## 2024-09-11 VITALS — BP 132/80 | HR 85 | Ht 63.0 in | Wt 169.9 lb

## 2024-09-11 DIAGNOSIS — R0609 Other forms of dyspnea: Secondary | ICD-10-CM | POA: Diagnosis not present

## 2024-09-11 DIAGNOSIS — I10 Essential (primary) hypertension: Secondary | ICD-10-CM | POA: Diagnosis not present

## 2024-09-11 DIAGNOSIS — Z79899 Other long term (current) drug therapy: Secondary | ICD-10-CM

## 2024-09-11 DIAGNOSIS — Z952 Presence of prosthetic heart valve: Secondary | ICD-10-CM

## 2024-09-11 DIAGNOSIS — E785 Hyperlipidemia, unspecified: Secondary | ICD-10-CM

## 2024-09-11 DIAGNOSIS — I350 Nonrheumatic aortic (valve) stenosis: Secondary | ICD-10-CM | POA: Diagnosis not present

## 2024-09-11 DIAGNOSIS — I3481 Nonrheumatic mitral (valve) annulus calcification: Secondary | ICD-10-CM

## 2024-09-11 DIAGNOSIS — F439 Reaction to severe stress, unspecified: Secondary | ICD-10-CM

## 2024-09-11 DIAGNOSIS — R072 Precordial pain: Secondary | ICD-10-CM

## 2024-09-11 NOTE — Patient Instructions (Signed)
 Medication Instructions:   Your physician recommends that you continue on your current medications as directed. Please refer to the Current Medication list given to you today.   *If you need a refill on your cardiac medications before your next appointment, please call your pharmacy*  Lab Work:  Your physician recommends that you return for a FASTING lipid profile after 4 injections of Repatha , fasting after midnight. Patent work given to pt today.    If you have labs (blood work) drawn today and your tests are completely normal, you will receive your results only by: MyChart Message (if you have MyChart) OR A paper copy in the mail If you have any lab test that is abnormal or we need to change your treatment, we will call you to review the results.  Testing/Procedures:  None ordered.   Follow-Up: At [facility], you and your health needs are our priority.  As part of our continuing mission to provide you with exceptional heart care, our providers are all part of one team.  This team includes your primary Cardiologist (physician) and Advanced Practice Providers or APPs (Physician Assistants and Nurse Practitioners) who all work together to provide you with the care you need, when you need it.  Your next appointment:   1 year(s)  Provider:   K. Chad Hilty, MD or Rosaline Bane, NP    We recommend signing up for the patient portal called MyChart.  Sign up information is provided on this After Visit Summary.  MyChart is used to connect with patients for Virtual Visits (Telemedicine).  Patients are able to view lab/test results, encounter notes, upcoming appointments, etc.  Non-urgent messages can be sent to your provider as well.   To learn more about what you can do with MyChart, go to [email or web address].   Other Instructions  Your physician wants you to follow-up in: 1 year.  You will receive a reminder letter in the mail two months in advance. If you don't  receive a letter, please call our office to schedule the follow-up appointment.

## 2024-09-23 ENCOUNTER — Other Ambulatory Visit: Payer: Self-pay | Admitting: Family Medicine

## 2024-09-23 DIAGNOSIS — I10 Essential (primary) hypertension: Secondary | ICD-10-CM

## 2024-10-06 ENCOUNTER — Encounter: Payer: Self-pay | Admitting: *Deleted

## 2024-10-07 ENCOUNTER — Ambulatory Visit: Payer: Self-pay

## 2024-10-07 NOTE — Telephone Encounter (Signed)
 FYI Only or Action Required?: FYI only for provider: appointment scheduled on 1/14.  Patient was last seen in primary care on 02/14/2024 by Billy Philippe SAUNDERS, NP.  Called Nurse Triage reporting Dysuria.  Symptoms began a week ago.  Interventions attempted: Nothing.  Symptoms are: gradually worsening.  Triage Disposition: See HCP Within 4 Hours (Or PCP Triage)  Patient/caregiver understands and will follow disposition?: no    Copied from CRM #[id number]. Topic: Clinical - Red Word Triage >> Oct 07, 2024  9:30 AM Wess RAMAN wrote: Red Word that prompted transfer to Nurse Triage: Bladder infection- burning during urination, back pain, feel really bad  Thinks she got bit by a spider, itching badly, bumps spreading Reason for Disposition  Side (flank) or lower back pain present  Answer Assessment - Initial Assessment Questions 1. SEVERITY: How bad is the pain?  (e.g., Scale 1-10; mild, moderate, or severe)     6 in back 3. PATTERN: Is pain present every time you urinate or just sometimes?      Every time 4. ONSET: When did the painful urination start?      About a week 5. FEVER: Do you have a fever? If Yes, ask: What is your temperature, how was it measured, and when did it start?     denies 6. PAST UTI: Have you had a urine infection before? If Yes, ask: When was the last time? and What happened that time?      Hx of UTI's 7. CAUSE: What do you think is causing the painful urination?  (e.g., UTI, scratch, Herpes sore)     UTI 8. OTHER SYMPTOMS: Do you have any other symptoms? (e.g., blood in urine, flank pain, genital sores, urgency, vaginal discharge)     Urgency, unsure about blood in urine  Pt refused appts offered today, states that she wants to wait until tomorrow d/t scheduling conflicts.  Protocols used: Urination Pain - Female-A-AH

## 2024-10-08 ENCOUNTER — Encounter: Payer: Self-pay | Admitting: Family Medicine

## 2024-10-08 ENCOUNTER — Ambulatory Visit (INDEPENDENT_AMBULATORY_CARE_PROVIDER_SITE_OTHER): Admitting: Family Medicine

## 2024-10-08 VITALS — BP 138/86 | HR 95 | Temp 98.8°F | Ht 63.0 in | Wt 164.0 lb

## 2024-10-08 DIAGNOSIS — W57XXXA Bitten or stung by nonvenomous insect and other nonvenomous arthropods, initial encounter: Secondary | ICD-10-CM | POA: Diagnosis not present

## 2024-10-08 DIAGNOSIS — R110 Nausea: Secondary | ICD-10-CM

## 2024-10-08 DIAGNOSIS — R300 Dysuria: Secondary | ICD-10-CM | POA: Diagnosis not present

## 2024-10-08 LAB — POCT URINALYSIS DIPSTICK
Bilirubin, UA: NEGATIVE
Blood, UA: NEGATIVE
Glucose, UA: NEGATIVE
Ketones, UA: NEGATIVE
Leukocytes, UA: NEGATIVE
Nitrite, UA: NEGATIVE
Protein, UA: POSITIVE — AB
Spec Grav, UA: 1.025
Urobilinogen, UA: NEGATIVE U/dL — AB
pH, UA: 5.5

## 2024-10-08 MED ORDER — ONDANSETRON HCL 4 MG PO TABS: 4.0000 mg | ORAL_TABLET | Freq: Three times a day (TID) | ORAL | 0 refills | Status: AC | PRN

## 2024-10-08 NOTE — Progress Notes (Signed)
 "  Acute Office Visit   Subjective:  Patient ID: [name] [name], female    DOB: 1955/10/30, 69 y.o.   MRN: [id number]  Chief Complaint  Patient presents with   Dysuria    HPI:  [name] [name] presents to the clinic due to symptoms of bilateral lower back pain. Reports frequency and dysuria. States that when she pushes on her lower abdomen it is tender. Symptoms started 2 weeks ago. Takes Oxycodone  for back pain and foot pain that she says is not helping.   Reports being bit by some type of bug on her Right foot. Reports redness and bumps that it is itchy. No discharge. She noticed this about 3- 4 days ago. Has tried OTC itch spray with relief.  Review of Systems  Constitutional: Negative.   HENT: Negative.    Eyes: Negative.   Respiratory: Negative.    Cardiovascular: Negative.   Gastrointestinal: Negative.   Genitourinary:  Positive for dysuria and flank pain.  Skin: Negative.   Neurological: Negative.   Psychiatric/Behavioral: Negative.       Objective:    BP 138/86   Pulse 95   Temp 98.8 F (37.1 C) (Oral)   Ht 5' 3 (1.6 m)   Wt 164 lb (74.4 kg)   SpO2 98%   BMI 29.05 kg/m  BP Readings from Last 3 Encounters:  10/08/24 138/86  09/11/24 132/80  08/27/24 118/62   Wt Readings from Last 3 Encounters:  10/08/24 164 lb (74.4 kg)  09/11/24 169 lb 14.4 oz (77.1 kg)  08/27/24 161 lb (73 kg)   Physical Exam Constitutional:      Appearance: Normal appearance.  Cardiovascular:     Rate and Rhythm: Normal rate and regular rhythm.     Heart sounds: Normal heart sounds.  Pulmonary:     Effort: Pulmonary effort is normal.     Breath sounds: Normal breath sounds.  Abdominal:     Tenderness: There is abdominal tenderness.  Musculoskeletal:     Right foot: Normal range of motion.  Feet:     Comments: Bug bite and redness on top of right foot. Skin integrity intact. Skin:    General: Skin is warm and dry.  Neurological:     General: No focal deficit present.     Mental  Status: She is alert and oriented to person, place, and time. Mental status is at baseline.  Psychiatric:        Mood and Affect: Mood normal.        Behavior: Behavior normal.        Thought Content: Thought content normal.        Judgment: Judgment normal.     Results for orders placed or performed in visit on 10/08/24  POC Urinalysis Dipstick  Result Value Ref Range   Color, UA yellow    Clarity, UA clear    Glucose, UA Negative Negative   Bilirubin, UA negative    Ketones, UA negative    Spec Grav, UA 1.025 1.010 - 1.025   Blood, UA negative    pH, UA 5.5 5.0 - 8.0   Protein, UA Positive (A) Negative   Urobilinogen, UA negative (A) 0.2 or 1.0 E.U./dL   Nitrite, UA negative    Leukocytes, UA Negative Negative   Appearance     Odor        Assessment & Plan:  Dysuria -     POCT urinalysis dipstick -     Urine Culture  Bug bite, initial  encounter  Nausea -     Ondansetron  HCl; Take 1 tablet (4 mg total) by mouth every 8 (eight) hours as needed for nausea or vomiting.  Dispense: 20 tablet; Refill: 0  -Discussed and examined most probable causes of acute symptoms. -In office urine sample was negative for infection. And sent for culture. Provider will follow up with urine culture results via MyChart. -Continue to keep right foot bug bite clean and dry. Can continue using over the counter anti-itch cream/spray. Discussed about signs of infection, send MyChart message if symptoms arise. -Prescribed Zofran  for nausea associated with urinary symptoms. -Continue taking Oxycodone  for pain, if not relieving pain, may need to follow up with pain clinic.  -Dicussed about following up with urogyn.  -Please schedule a follow up in 1 month for chronic management and other concerns.   JoAnna Williamson, NP  "

## 2024-10-08 NOTE — Patient Instructions (Addendum)
 It was nice to see you today Today we: -Discussed and examined most probable causes of acute symptoms. -In office urine sample was negative for infection. And sent for culture. Provider will follow up with urine culture results via MyChart. -Continue to keep right foot bug bite clean and dry. Can continue using over the counter anti-itch cream/spray. Discussed about signs of infection, send MyChart message if symptoms arise. -Prescribed Zofran  for nausea associated with urinary symptoms. -Continue taking Oxycodone  for pain, if not relieving pain, may need to follow up with pain clinic.  -Dicussed about following up with urogyn.  -Please schedule a follow up in 1 month for chronic management and other concerns.

## 2024-10-09 LAB — URINE CULTURE
MICRO NUMBER:: 17467991
SPECIMEN QUALITY:: ADEQUATE

## 2024-10-10 ENCOUNTER — Ambulatory Visit: Payer: Self-pay | Admitting: Family Medicine

## 2024-11-10 ENCOUNTER — Ambulatory Visit: Admitting: Family Medicine

## 2025-09-02 ENCOUNTER — Ambulatory Visit
